# Patient Record
Sex: Female | Born: 1945
Health system: Southern US, Community
[De-identification: ages and names within clinical notes are randomized; demographics above are authoritative.]

## PROBLEM LIST (undated history)

## (undated) DIAGNOSIS — K449 Diaphragmatic hernia without obstruction or gangrene: Secondary | ICD-10-CM

## (undated) DIAGNOSIS — K219 Gastro-esophageal reflux disease without esophagitis: Secondary | ICD-10-CM

## (undated) DIAGNOSIS — R112 Nausea with vomiting, unspecified: Secondary | ICD-10-CM

## (undated) DIAGNOSIS — C539 Malignant neoplasm of cervix uteri, unspecified: Secondary | ICD-10-CM

## (undated) DIAGNOSIS — D649 Anemia, unspecified: Secondary | ICD-10-CM

## (undated) DIAGNOSIS — R519 Headache, unspecified: Secondary | ICD-10-CM

## (undated) DIAGNOSIS — Z8719 Personal history of other diseases of the digestive system: Secondary | ICD-10-CM

## (undated) DIAGNOSIS — B009 Herpesviral infection, unspecified: Secondary | ICD-10-CM

## (undated) DIAGNOSIS — Z9889 Other specified postprocedural states: Secondary | ICD-10-CM

## (undated) DIAGNOSIS — H409 Unspecified glaucoma: Secondary | ICD-10-CM

## (undated) DIAGNOSIS — K222 Esophageal obstruction: Secondary | ICD-10-CM

## (undated) DIAGNOSIS — H04123 Dry eye syndrome of bilateral lacrimal glands: Secondary | ICD-10-CM

## (undated) DIAGNOSIS — N809 Endometriosis, unspecified: Secondary | ICD-10-CM

## (undated) DIAGNOSIS — G4700 Insomnia, unspecified: Secondary | ICD-10-CM

## (undated) DIAGNOSIS — Z860100 Personal history of colon polyps, unspecified: Secondary | ICD-10-CM

## (undated) DIAGNOSIS — M1990 Unspecified osteoarthritis, unspecified site: Secondary | ICD-10-CM

## (undated) DIAGNOSIS — K589 Irritable bowel syndrome without diarrhea: Secondary | ICD-10-CM

## (undated) DIAGNOSIS — G2581 Restless legs syndrome: Secondary | ICD-10-CM

## (undated) DIAGNOSIS — I10 Essential (primary) hypertension: Secondary | ICD-10-CM

## (undated) DIAGNOSIS — K2970 Gastritis, unspecified, without bleeding: Secondary | ICD-10-CM

## (undated) DIAGNOSIS — K5790 Diverticulosis of intestine, part unspecified, without perforation or abscess without bleeding: Secondary | ICD-10-CM

## (undated) HISTORY — DX: Unspecified glaucoma: H40.9

## (undated) HISTORY — DX: Nausea with vomiting, unspecified: R11.2

## (undated) HISTORY — DX: Anemia, unspecified: D64.9

## (undated) HISTORY — PX: TUBAL LIGATION: SHX77

## (undated) HISTORY — DX: Essential (primary) hypertension: I10

## (undated) HISTORY — DX: Personal history of colonic polyps: Z86.010

## (undated) HISTORY — DX: Personal history of colon polyps, unspecified: Z86.0100

## (undated) HISTORY — DX: Personal history of other diseases of the digestive system: Z87.19

## (undated) HISTORY — DX: Diverticulosis of intestine, part unspecified, without perforation or abscess without bleeding: K57.90

## (undated) HISTORY — PX: HAND SURGERY: SHX662

## (undated) HISTORY — DX: Insomnia, unspecified: G47.00

## (undated) HISTORY — DX: Unspecified osteoarthritis, unspecified site: M19.90

## (undated) HISTORY — PX: OOPHORECTOMY: SHX86

## (undated) HISTORY — DX: Other specified postprocedural states: Z98.890

## (undated) HISTORY — DX: Esophageal obstruction: K22.2

## (undated) HISTORY — DX: Gastro-esophageal reflux disease without esophagitis: K21.9

## (undated) HISTORY — DX: Irritable bowel syndrome, unspecified: K58.9

## (undated) HISTORY — DX: Herpesviral infection, unspecified: B00.9

## (undated) HISTORY — DX: Malignant neoplasm of cervix uteri, unspecified: C53.9

## (undated) HISTORY — DX: Diaphragmatic hernia without obstruction or gangrene: K44.9

## (undated) HISTORY — DX: Restless legs syndrome: G25.81

## (undated) HISTORY — DX: Dry eye syndrome of bilateral lacrimal glands: H04.123

## (undated) HISTORY — DX: Gastritis, unspecified, without bleeding: K29.70

## (undated) HISTORY — DX: Endometriosis, unspecified: N80.9

## (undated) HISTORY — PX: CERVICAL CONE BIOPSY: SUR198

---

## 1975-03-07 DIAGNOSIS — C539 Malignant neoplasm of cervix uteri, unspecified: Secondary | ICD-10-CM

## 1975-03-07 HISTORY — DX: Malignant neoplasm of cervix uteri, unspecified: C53.9

## 1978-03-06 HISTORY — PX: CHOLECYSTECTOMY: SHX55

## 1996-03-06 HISTORY — PX: ABDOMINAL HYSTERECTOMY: SHX81

## 1996-03-06 HISTORY — PX: APPENDECTOMY: SHX54

## 1997-12-15 ENCOUNTER — Encounter: Admission: RE | Admit: 1997-12-15 | Discharge: 1998-03-15 | Payer: Self-pay | Admitting: *Deleted

## 1999-01-21 ENCOUNTER — Ambulatory Visit (HOSPITAL_COMMUNITY): Admission: RE | Admit: 1999-01-21 | Discharge: 1999-01-21 | Payer: Self-pay | Admitting: Internal Medicine

## 1999-08-16 ENCOUNTER — Encounter: Admission: RE | Admit: 1999-08-16 | Discharge: 1999-08-16 | Payer: Self-pay | Admitting: Obstetrics and Gynecology

## 1999-08-16 ENCOUNTER — Encounter: Payer: Self-pay | Admitting: Obstetrics and Gynecology

## 2000-02-02 ENCOUNTER — Encounter: Payer: Self-pay | Admitting: Internal Medicine

## 2000-02-02 ENCOUNTER — Ambulatory Visit (HOSPITAL_COMMUNITY): Admission: RE | Admit: 2000-02-02 | Discharge: 2000-02-02 | Payer: Self-pay | Admitting: Internal Medicine

## 2000-03-02 ENCOUNTER — Encounter (INDEPENDENT_AMBULATORY_CARE_PROVIDER_SITE_OTHER): Payer: Self-pay | Admitting: *Deleted

## 2000-03-02 ENCOUNTER — Encounter: Payer: Self-pay | Admitting: Surgery

## 2000-03-06 HISTORY — PX: COLON RESECTION: SHX5231

## 2000-03-23 ENCOUNTER — Encounter: Payer: Self-pay | Admitting: Surgery

## 2000-03-29 ENCOUNTER — Encounter (INDEPENDENT_AMBULATORY_CARE_PROVIDER_SITE_OTHER): Payer: Self-pay | Admitting: Specialist

## 2000-03-29 ENCOUNTER — Encounter: Payer: Self-pay | Admitting: Internal Medicine

## 2000-03-29 ENCOUNTER — Inpatient Hospital Stay (HOSPITAL_COMMUNITY): Admission: RE | Admit: 2000-03-29 | Discharge: 2000-04-03 | Payer: Self-pay | Admitting: Surgery

## 2000-04-03 ENCOUNTER — Encounter (INDEPENDENT_AMBULATORY_CARE_PROVIDER_SITE_OTHER): Payer: Self-pay | Admitting: *Deleted

## 2000-08-17 ENCOUNTER — Encounter: Admission: RE | Admit: 2000-08-17 | Discharge: 2000-08-17 | Payer: Self-pay | Admitting: Obstetrics and Gynecology

## 2000-08-17 ENCOUNTER — Encounter: Payer: Self-pay | Admitting: Obstetrics and Gynecology

## 2001-03-06 HISTORY — PX: COLON SURGERY: SHX602

## 2001-04-16 ENCOUNTER — Encounter: Admission: RE | Admit: 2001-04-16 | Discharge: 2001-05-21 | Payer: Self-pay | Admitting: Internal Medicine

## 2001-09-13 ENCOUNTER — Encounter: Payer: Self-pay | Admitting: Internal Medicine

## 2001-10-23 ENCOUNTER — Encounter: Payer: Self-pay | Admitting: Family Medicine

## 2001-10-23 ENCOUNTER — Encounter: Admission: RE | Admit: 2001-10-23 | Discharge: 2001-10-23 | Payer: Self-pay | Admitting: Family Medicine

## 2001-12-03 ENCOUNTER — Encounter: Payer: Self-pay | Admitting: Internal Medicine

## 2001-12-17 ENCOUNTER — Ambulatory Visit (HOSPITAL_COMMUNITY): Admission: RE | Admit: 2001-12-17 | Discharge: 2001-12-17 | Payer: Self-pay | Admitting: Internal Medicine

## 2001-12-17 ENCOUNTER — Encounter: Payer: Self-pay | Admitting: Internal Medicine

## 2002-07-22 ENCOUNTER — Ambulatory Visit (HOSPITAL_BASED_OUTPATIENT_CLINIC_OR_DEPARTMENT_OTHER): Admission: RE | Admit: 2002-07-22 | Discharge: 2002-07-22 | Payer: Self-pay | Admitting: Urology

## 2002-07-22 ENCOUNTER — Encounter (INDEPENDENT_AMBULATORY_CARE_PROVIDER_SITE_OTHER): Payer: Self-pay

## 2002-10-27 ENCOUNTER — Encounter: Admission: RE | Admit: 2002-10-27 | Discharge: 2002-10-27 | Payer: Self-pay | Admitting: Obstetrics and Gynecology

## 2002-10-27 ENCOUNTER — Encounter: Payer: Self-pay | Admitting: Obstetrics and Gynecology

## 2003-10-30 ENCOUNTER — Encounter: Admission: RE | Admit: 2003-10-30 | Discharge: 2003-10-30 | Payer: Self-pay | Admitting: Obstetrics and Gynecology

## 2003-12-18 ENCOUNTER — Encounter: Payer: Self-pay | Admitting: Internal Medicine

## 2003-12-18 ENCOUNTER — Ambulatory Visit (HOSPITAL_COMMUNITY): Admission: RE | Admit: 2003-12-18 | Discharge: 2003-12-18 | Payer: Self-pay | Admitting: Internal Medicine

## 2003-12-18 ENCOUNTER — Encounter (INDEPENDENT_AMBULATORY_CARE_PROVIDER_SITE_OTHER): Payer: Self-pay | Admitting: *Deleted

## 2004-12-09 ENCOUNTER — Encounter: Admission: RE | Admit: 2004-12-09 | Discharge: 2004-12-09 | Payer: Self-pay | Admitting: Obstetrics and Gynecology

## 2004-12-23 ENCOUNTER — Encounter: Admission: RE | Admit: 2004-12-23 | Discharge: 2004-12-23 | Payer: Self-pay | Admitting: Obstetrics and Gynecology

## 2005-05-26 ENCOUNTER — Encounter: Admission: RE | Admit: 2005-05-26 | Discharge: 2005-05-26 | Payer: Self-pay | Admitting: Obstetrics and Gynecology

## 2006-01-12 ENCOUNTER — Encounter: Admission: RE | Admit: 2006-01-12 | Discharge: 2006-01-12 | Payer: Self-pay | Admitting: Obstetrics and Gynecology

## 2007-01-18 ENCOUNTER — Encounter: Admission: RE | Admit: 2007-01-18 | Discharge: 2007-01-18 | Payer: Self-pay | Admitting: Obstetrics and Gynecology

## 2008-01-20 ENCOUNTER — Encounter: Admission: RE | Admit: 2008-01-20 | Discharge: 2008-01-20 | Payer: Self-pay | Admitting: Obstetrics and Gynecology

## 2009-01-20 ENCOUNTER — Encounter: Admission: RE | Admit: 2009-01-20 | Discharge: 2009-01-20 | Payer: Self-pay | Admitting: Family Medicine

## 2009-01-27 ENCOUNTER — Telehealth: Payer: Self-pay | Admitting: Internal Medicine

## 2009-01-27 ENCOUNTER — Encounter (INDEPENDENT_AMBULATORY_CARE_PROVIDER_SITE_OTHER): Payer: Self-pay | Admitting: *Deleted

## 2009-02-04 DIAGNOSIS — N809 Endometriosis, unspecified: Secondary | ICD-10-CM | POA: Insufficient documentation

## 2009-02-04 DIAGNOSIS — K5730 Diverticulosis of large intestine without perforation or abscess without bleeding: Secondary | ICD-10-CM | POA: Insufficient documentation

## 2009-02-04 DIAGNOSIS — K222 Esophageal obstruction: Secondary | ICD-10-CM

## 2009-02-04 DIAGNOSIS — K219 Gastro-esophageal reflux disease without esophagitis: Secondary | ICD-10-CM | POA: Insufficient documentation

## 2009-02-04 DIAGNOSIS — K2970 Gastritis, unspecified, without bleeding: Secondary | ICD-10-CM | POA: Insufficient documentation

## 2009-02-04 DIAGNOSIS — K2990 Gastroduodenitis, unspecified, without bleeding: Secondary | ICD-10-CM

## 2009-02-04 DIAGNOSIS — K589 Irritable bowel syndrome without diarrhea: Secondary | ICD-10-CM | POA: Insufficient documentation

## 2009-02-04 DIAGNOSIS — K449 Diaphragmatic hernia without obstruction or gangrene: Secondary | ICD-10-CM | POA: Insufficient documentation

## 2009-02-05 ENCOUNTER — Ambulatory Visit: Payer: Self-pay | Admitting: Internal Medicine

## 2009-02-05 DIAGNOSIS — M129 Arthropathy, unspecified: Secondary | ICD-10-CM | POA: Insufficient documentation

## 2009-02-05 DIAGNOSIS — R141 Gas pain: Secondary | ICD-10-CM | POA: Insufficient documentation

## 2009-02-05 DIAGNOSIS — R142 Eructation: Secondary | ICD-10-CM

## 2009-02-05 DIAGNOSIS — R143 Flatulence: Secondary | ICD-10-CM

## 2009-02-05 DIAGNOSIS — I10 Essential (primary) hypertension: Secondary | ICD-10-CM | POA: Insufficient documentation

## 2009-02-05 DIAGNOSIS — R1032 Left lower quadrant pain: Secondary | ICD-10-CM | POA: Insufficient documentation

## 2009-02-05 DIAGNOSIS — R1310 Dysphagia, unspecified: Secondary | ICD-10-CM

## 2009-02-05 DIAGNOSIS — C539 Malignant neoplasm of cervix uteri, unspecified: Secondary | ICD-10-CM | POA: Insufficient documentation

## 2009-02-18 ENCOUNTER — Ambulatory Visit: Payer: Self-pay | Admitting: Internal Medicine

## 2009-02-22 ENCOUNTER — Encounter: Payer: Self-pay | Admitting: Internal Medicine

## 2010-01-24 ENCOUNTER — Encounter: Admission: RE | Admit: 2010-01-24 | Discharge: 2010-01-24 | Payer: Self-pay | Admitting: Family Medicine

## 2010-07-22 NOTE — H&P (Signed)
[facility]  Patient:    Andrea Byrd, Andrea Byrd                           MRN: [id number] Attending:  Katherine Roan, M.D.                         History and Physical  CHIEF COMPLAINT:  Pelvic pain and stress urinary incontinence.  HISTORY OF PRESENT ILLNESS:  Andrea Byrd is a 65 year old gravida 1, para 1 female status post TAH for carcinoma in situ in 1986 with subsequent normal Pap smears who presents for anterior colectomy for diverticulitis; and at the time, she has desired Burch procedure for stress urinary incontinence which appears to be genuine stress urinary incontinence. She is on hormone replacement therapy in the form of Premarin, and she takes Xanax p.r.n.  ALLERGIES:  She is allergic to [facility].  PAST SURGICAL HISTORY:  A cold knife conization in 1977, hysterectomy in 1986, and tubal ligation in 1978. She has had one normal baby.  She presents now for Burch procedure in conjunction with a colectomy.  REVIEW OF SYSTEMS:  HEENT:  She wears glasses but no decrease in visual, auditory acuity. No frequent headaches or dizziness. HEART:  No history of hypertension or rheumatic fever. No chest pain. No shortness of breath. LUNGS: No chronic cough. No asthma. No hayfever. GU:  She has genuine stress urinary incontinence with minimal bladder irritability. GI:  She has been treated for diverticulitis and has segmental changes, so much so that a segmental resection has been recommended. In addition to that, she has irritable bowel. MUSCLES, BONES, AND JOINTS:  No history of fractures or arthritis.  SOCIAL HISTORY:  She is an Teacher, early years/pre.  FAMILY HISTORY:  Her mother is 59 and has diabetes and high blood pressure. Father died at 21. He had hypertension. She has two brothers and two sisters. One of her sisters is diabetic. One brother committed suicide. Her maternal grandmother, mother, and sister are all diabetics.  PHYSICAL EXAMINATION:  VITAL  SIGNS:  Weight of 143.  HEENT:  Unremarkable. Oropharynx is not injected.  NECK:  Supple. Carotid pulses are equal without bruits. No adenopathy.  BREASTS:  No masses or tenderness.  LUNGS:  Clear to P&A.  HEART:  Normal sinus rhythm. No murmurs, heaves, thrills, rubs, or gallops.  ABDOMEN:  Soft and flat. Liver, spleen, and kidneys are not palpated. There is a transverse incision in the abdomen. There is tenderness in the lower quadrant. No rebound or guarding.  PELVIC:  Fairly normal vagina. There is a second degree cystocele with moderate amount of urethral mobility. No pelvic masses are noted. Vaginal vault is well supported, and recent Pap smear is normal. Hemoccult is negative.  EXTREMITIES:  Good range of motion. Equal pulses and reflexes.  NEUROLOGICAL:  Cranial nerves are intact.  IMPRESSION:  Genuine stress urinary incontinence for Burch procedure in conjunction with partial colectomy for diverticulitis. Risks, benefits, and failure rates have been discussed with the patient. Of note, there may be an associated posterior repair if posterior segment relaxation is noted under anesthesia.DD:  03/27/00 TD:  03/27/00 Job: [id number]

## 2010-07-22 NOTE — Op Note (Signed)
[facility]  Patient:    Andrea Byrd, Andrea Byrd                           MRN: [id number] Proc. Date: 03/29/00 Attending:  Katherine Roan, M.D.                           Operative Report  PREOPERATIVE DIAGNOSES: 1. Diverticulitis with pelvic pain and diverticulosis. 2. Stress urinary incontinence.  POSTOPERATIVE DIAGNOSES: 1. Diverticulitis with pelvic pain and diverticulosis. 2. Stress urinary incontinence.  OPERATION PERFORMED:   Anterior colectomy by Dr. Jamey Ripa and Burch procedure with posterior repair.  DESCRIPTION OF PROCEDURE:  The patient was placed in the supine position, prepped and draped in the usual fashion. Dr. Jamey Ripa opened the abdomen through a midline approach and did an anterior resection of the sigmoid diverticulitis. At the termination of the procedure in closing the peritoneum, we dissected the space of Retzius and identified the vagina and sutured the paravaginal tissue to the inguinal ligament on each side. This affected an excellent bladder repair. The space of Retzius was irrigated. The midline incision and fascia was closed with figure-of-eight sutures of #0 Novofil. Seven knots were placed in each suture. Hemostasis was secure. We then irrigated the subcutaneum and closed the skin with clips. Following this, we went down below and dissected the wedge of the perineal body, dissected the rectum from the prerectal fascia and then plicated this in the midline with interrupted sutures of 3-0 Vicryl. The wedge of the vagina was excised and then closed with a running suture of 2-0 chromic. The superficial transverse perinei muscle was closed with interrupted sutures of 3-0 Vicryl. The skin was closed with a continuation of a 2-0 chromic. The vagina was then packed. The levator sling was then infiltrated with 0.5% Marcaine with epinephrine on each side. The Foley was draining clear urine. [name] tolerated this procedure well and was sent to the  recovery room in good condition. DD:  03/29/00 TD:  03/29/00 Job: [id number],/[id number]

## 2010-07-22 NOTE — Discharge Summary (Signed)
[facility]  Patient:    Andrea Byrd, Andrea Byrd                         MRN: [id number] Adm. Date:  [id number] Disc. Date: 04/03/00 Attending:  Charlton Haws CC:         Delorse Lek, M.D.  Hedwig Morton. Juanda Chance, M.D. [facility]  S. Kyra Manges, M.D.   Discharge Summary  CCS#:  [id number]  FINAL DIAGNOSES: 1. Sigmoid diverticulitis with pelvic pain. 2. Stress incontinence.  CLINICAL HISTORY:  This patient is a 65 year old with multiple episodes of diverticulitis that were all fairly mild but had been getting increasing in frequency. Workup by Dr. Lina Sar had been unremarkable except for the presence of marked diverticulosis. She also had stress incontinence.  HOSPITAL COURSE:  The patient was admitted and taken to the operating room where a sigmoid colectomy with primary anastomosis was performed. In addition, a burch procedure with a posterior repair for her stress incontinence was performed. She tolerated the procedures well. Postoperatively, she had a very benign postoperative course. The NG tube was not needed. She had relatively minimal amounts of pain. Initially she was on PCA morphine but was a little bit itching from that so we switched to PCA Dilaudid. She continued to improve over the next couple of days. Diet was gradually advanced and by January 29 was having bowel movements albeit loose, and tolerating solid foods with no fever. Her abdomen was soft and benign and wound was healing nicely. C Dif was checked and was negative.  Pathology confirmed diverticulitis.  The patient was discharged on January 29 in satisfactory condition, Tylox for pain, limited activities, usual diet, follow-up in my office in approximately two weeks and also approximately two weeks with Dr. Elana Alm. DD:  04/03/00 TD:  04/03/00 Job: [id number]

## 2010-07-22 NOTE — Op Note (Signed)
[facility]  Patient:    Andrea Byrd, Andrea Byrd                        MRN: [id number] Proc. Date: 03/29/00 Attending:  Currie Paris, M.D. CC:         Hedwig Morton. Juanda Chance, M.D. [facility]  Delorse Lek, M.D.  Katherine Roan, M.D.   Operative Report  ACCOUNT:  [id number]  OFFICE MEDICAL RECORD NUMBER #[id number]  PREOPERATIVE DIAGNOSIS:  Sigmoid diverticulosis, with recurring episodes of pain and sigmoid diverticulitis.  OPERATION:  Sigmoid colectomy.  SURGEON:  Currie Paris, M.D.  ASSISTANT:  Katherine Roan, M.D.  ANESTHESIA:  General endotracheal.  INDICATIONS:  This is a 66 year old woman who has had multiple episodes of abdominal pain, and has been evaluated in the past by her gastroenterologist, and these episodes have been thought to be diverticular in origin.  She has had known diverticular disease with tortuosity of the rectosigmoid, distal sigmoid, and after having several of these episodes, she elected to proceed to a sigmoid colectomy. She has had problems with stress incontinence as well, and Dr. Elana Alm had seen  her, and she elected to have a Burch repair and posterior repair done at the same procedure.  DESCRIPTION OF PROCEDURE:  The patient was brought to the operating room and after satisfactory general endotracheal anesthesia had been obtained, she was placed n Allen stirrups.  The abdomen and pelvic areas and perineum were prepped and draped as a sterile field.  A midline incision was made from the umbilicus to the symphysis.  Bleeders were then electrocoagulated.  The fascia was opened in the  midline, and the peritoneal cavity was entered.  The sigmoid colon was noted to be somewhat thickened and tethered in her left lower quadrant, where her prior hysterectomy had been done, and probably from where the ovaries and some endometriosis had been taken out.  This was chronically inflamed but not acutely inflamed, and I  thought most likely the source of her pain, as this was fairly consistent with the location of where she described her symptoms.  There were no other palpable masses in her abdomen.  The appendix was surgically absent, as were the pelvic organs.  The liver and gallbladder could not be palpated because of ome adhesions in the right upper quadrant from prior surgery.  The wound stretcher and self-retaining retractors were placed, and the small bowel packed out of the pelvis.  I began by mobilizing the peritoneum laterally along the white line of Toldt, to free up the sigmoid a little bit above the area that was tethered, and in doing so were able to identify the left ureter and keep it posterior.  Where the colon was tethered it was somewhat anterior, and the dense adhesions were taken down sharply with the scissors.  The bleeders were electrocoagulated, tied, or clipped.  Once I had this freed up and mobilized, I  found a place at the very distal descending, where I had this freed up, and thought that this would be a good place to divide the bowel, as it appeared to be normal. I did not see any significant evidence of diverticula.  I made a window in the bowel mesentery, and divided the bowel between a Kocher and an Allen clamp. The mesentery was then divided between clamps, going down, staying fairly close to he bowel until I had gotten to the junction of the rectosigmoid, where I felt I  was beyond any evidence of diverticular disease, and had completely removed the segment involved with chronic inflammatory changes.  A couple of stay sutures were placed on the colon here, and the colon was divided off using a Satinsky for a cutting  guide.  The two ends of the bowel overlapped each other easily, so I thought that there  would be no tension on the anastomosis.  A hand-sewn single layer anastomosis was done.  A posterior row of #3-0 silks was placed and then tied.   Inverting sutures were then used to close most of the anterior row with the last few sutures being placed as Gambees.  This produced a nice anastomosis which was patent enough to  easily admit my thumb.  There was no tension.  No evidence of active bleeding. The mesenteric defect was closed with a single #3-0 silk.  At this point we irrigated and  make sure that everything was dry.  I did leave a little Surgicel along the lateral pelvic wall where there had been some oozing, where the adhesions had been taken down, but prior to placing it, it appeared to be dry.  The peritoneum was then closed with a running PDS.  At this point Dr. Elana Alm took over the procedure, to do the Burch procedure, the posterior repair, and to close the abdomen.  The remaining portion of the procedure will therefore be dictated by him. DD:  03/29/00 TD:  03/29/00 Job: [id number]

## 2010-07-22 NOTE — Op Note (Signed)
NAME:  Andrea Byrd, Andrea Byrd                            ACCOUNT NO.:  [id number]   MEDICAL RECORD NO.:  [id number]                   PATIENT TYPE:  AMB   LOCATION:  [facility]                                 FACILITY:  [facility]   PHYSICIAN:  Ronald L. Ovidio Hanger, M.D.           DATE OF BIRTH:  1945-04-30   DATE OF PROCEDURE:  07/22/2002  DATE OF DISCHARGE:                                 OPERATIVE REPORT   PREOPERATIVE DIAGNOSIS:  Bladder symptomatology:  Urgency, frequency,  dysuria, and bladder pain.   PROCEDURES:  1. Cystourethroscopy.  2. Hydraulic bladder distention.  3. Bladder biopsy.  4. Excision of foreign body from bladder, felt to be most likely an Ethibond     suture.   SURGEON:  Lucrezia Starch. Earlene Plater, M.D.   ANESTHESIA:  General mask anesthesia.   ESTIMATED BLOOD LOSS:  Negligible.   TUBES:  None.   COMPLICATIONS:  None.   INDICATION FOR PROCEDURE:  The patient is a lovely 65 year old white female  who presented with a three-month history of sharp pain when finishing  urination, pressure when she bends over.  She has had a round of antibiotics  that did not help.  She notes that she is slightly better after urinating.  She also has frequent urge to urinate and some pain after urinating.  We  discussed risks, benefits, and alternatives, and after understanding this  she has elected to proceed with the above procedure.  Of note, she had  severe endometriosis and has undergone a small bowel resection and an  anterior vesicocystourethropexy in 2001.   DESCRIPTION OF PROCEDURE:  The patient was placed in the supine position,  after the proper general mask anesthesia was placed in the dorsal lithotomy  position and prepped and draped with Betadine in a sterile fashion.  Cystourethroscopy was performed with a 22.5 [place] panendoscope utilizing  the 12 and 70 degree lenses.  The bladder was carefully inspected.  Efflux  of clear urine was noted through the normally-placed ureteral  orifices  bilaterally.  On the right lateral anterior portion of the bladder, a green  suture was noted to be tangentially through-and-through and protruding into  the bladder.  There was some inflammation noted around it.  It was felt that  this very well could be causing the discomfort.  It was a green woven suture  and felt to be most likely an Ethibond.  It had some calcific debris on it.  Hydraulic bladder distention was performed to 80 cmH2O.  She was noted to  have a capacity of 750 mL.  When it was drained, there were a few  glomerulations posteriorly and the posterior bladder was biopsied with cold  cup biopsy forceps and the base cauterized with Bugbee coagulation cautery.  It was felt that the suture may very well be stimulating things.  With a  grasping forceps it was pulled and noted to be quite  tight.  Utilizing  meatotomy scissors, it was cut right at the mucosal level and the suture  snapped back and utilizing the 30 degree lens, a two-port system with a 25  [place] panendoscope, a suture end was grasped with alligator forceps and  utilizing the meatotomy forceps through a separate port, it was snipped and  then the bladder was distended with approximately 500 mL, and the sutures  appeared to disappear submucosally.  The mucosa was then gently  cauterized with Bovie coagulation cautery and the foreign body was submitted  to pathology.  Reinspection revealed no other lesions present.  The bladder  was drained, again efflux of clear urine was noted bilaterally, and the  patient was taken to the recovery room in stable condition.                                               Ronald L. Ovidio Hanger, M.D.    RLD/MEDQ  D:  07/22/2002  T:  07/22/2002  Job:  [id number]

## 2011-01-17 ENCOUNTER — Other Ambulatory Visit: Payer: Self-pay | Admitting: Family Medicine

## 2011-01-17 DIAGNOSIS — Z1231 Encounter for screening mammogram for malignant neoplasm of breast: Secondary | ICD-10-CM

## 2011-02-09 ENCOUNTER — Ambulatory Visit: Payer: Self-pay

## 2011-02-16 ENCOUNTER — Ambulatory Visit
Admission: RE | Admit: 2011-02-16 | Discharge: 2011-02-16 | Disposition: A | Discharge status: Home / Self Care | Payer: 59 | Source: Ambulatory Visit | Attending: Family Medicine | Admitting: Family Medicine

## 2011-02-16 DIAGNOSIS — Z1231 Encounter for screening mammogram for malignant neoplasm of breast: Secondary | ICD-10-CM

## 2011-11-10 ENCOUNTER — Other Ambulatory Visit: Payer: Self-pay | Admitting: Family Medicine

## 2011-11-10 DIAGNOSIS — Z1231 Encounter for screening mammogram for malignant neoplasm of breast: Secondary | ICD-10-CM

## 2011-12-04 ENCOUNTER — Encounter: Payer: Self-pay | Admitting: Internal Medicine

## 2011-12-20 ENCOUNTER — Other Ambulatory Visit: Payer: Self-pay | Admitting: Family Medicine

## 2011-12-20 DIAGNOSIS — Z780 Asymptomatic menopausal state: Secondary | ICD-10-CM

## 2011-12-22 ENCOUNTER — Other Ambulatory Visit (INDEPENDENT_AMBULATORY_CARE_PROVIDER_SITE_OTHER): Payer: Self-pay | Admitting: Surgery

## 2012-01-05 ENCOUNTER — Encounter: Payer: Self-pay | Admitting: *Deleted

## 2012-01-11 ENCOUNTER — Encounter: Payer: Self-pay | Admitting: *Deleted

## 2012-01-25 ENCOUNTER — Other Ambulatory Visit: Payer: Self-pay

## 2012-01-31 ENCOUNTER — Other Ambulatory Visit (INDEPENDENT_AMBULATORY_CARE_PROVIDER_SITE_OTHER): Payer: Medicare Other

## 2012-01-31 ENCOUNTER — Encounter: Payer: Self-pay | Admitting: Internal Medicine

## 2012-01-31 ENCOUNTER — Ambulatory Visit (INDEPENDENT_AMBULATORY_CARE_PROVIDER_SITE_OTHER): Payer: Medicare Other | Admitting: Internal Medicine

## 2012-01-31 VITALS — BP 100/60 | HR 100 | Ht 62.25 in | Wt 170.4 lb

## 2012-01-31 DIAGNOSIS — R6889 Other general symptoms and signs: Secondary | ICD-10-CM

## 2012-01-31 DIAGNOSIS — D649 Anemia, unspecified: Secondary | ICD-10-CM

## 2012-01-31 LAB — COMPREHENSIVE METABOLIC PANEL WITH GFR
ALT: 23 U/L (ref 0–35)
AST: 22 U/L (ref 0–37)
Albumin: 3.7 g/dL (ref 3.5–5.2)
Alkaline Phosphatase: 118 U/L — ABNORMAL HIGH (ref 39–117)
BUN: 14 mg/dL (ref 6–23)
CO2: 30 meq/L (ref 19–32)
Calcium: 9.3 mg/dL (ref 8.4–10.5)
Chloride: 103 meq/L (ref 96–112)
Creatinine, Ser: 0.9 mg/dL (ref 0.4–1.2)
GFR: 71.16 mL/min (ref >60.00)
Glucose, Bld: 90 mg/dL (ref 70–99)
Potassium: 3.4 meq/L — ABNORMAL LOW (ref 3.5–5.1)
Sodium: 139 meq/L (ref 135–145)
Total Bilirubin: 0.4 mg/dL (ref 0.3–1.2)
Total Protein: 7.1 g/dL (ref 6.0–8.3)

## 2012-01-31 LAB — CBC WITH DIFFERENTIAL/PLATELET
Basophils Absolute: 0 K/uL (ref 0.0–0.1)
Basophils Relative: 0.7 % (ref 0.0–3.0)
Eosinophils Absolute: 0.1 K/uL (ref 0.0–0.7)
Eosinophils Relative: 1.8 % (ref 0.0–5.0)
HCT: 33.5 % — ABNORMAL LOW (ref 36.0–46.0)
Hemoglobin: 10.5 g/dL — ABNORMAL LOW (ref 12.0–15.0)
Lymphocytes Relative: 27.6 % (ref 12.0–46.0)
Lymphs Abs: 1.8 K/uL (ref 0.7–4.0)
MCHC: 31.3 g/dL (ref 30.0–36.0)
MCV: 78.8 fl (ref 78.0–100.0)
Monocytes Absolute: 0.5 K/uL (ref 0.1–1.0)
Monocytes Relative: 7 % (ref 3.0–12.0)
Neutro Abs: 4.1 K/uL (ref 1.4–7.7)
Neutrophils Relative %: 62.9 % (ref 43.0–77.0)
Platelets: 382 K/uL (ref 150.0–400.0)
RBC: 4.24 Mil/uL (ref 3.87–5.11)
RDW: 17.9 % — ABNORMAL HIGH (ref 11.5–14.6)
WBC: 6.5 K/uL (ref 4.5–10.5)

## 2012-01-31 LAB — FOLATE: Folate: 16.4 ng/mL (ref >5.9)

## 2012-01-31 LAB — SEDIMENTATION RATE: Sed Rate: 30 mm/h — ABNORMAL HIGH (ref 0–22)

## 2012-01-31 NOTE — Patient Instructions (Addendum)
Your physician has requested that you go to the basement for the following lab work before leaving today: CBC, IBC, Sed Rate, Folic Acid, CMET  CC: Dr Marjory Lies

## 2012-01-31 NOTE — Progress Notes (Signed)
Andrea Byrd 1945-11-24 MRN [id number]   History of Present Illness:  This is a 66 year old white female with a history of symptomatic diverticulosis, post sigmoid resection in 2002 with recurrent left lower quadrant abdominal discomfort consistent with irritable bowel syndrome. She is here today because of new onset anemia. In September 2013, her hemoglobin was 10.6 with a hematocrit of 32.6 and MCV of 80. Her B12 level was 26. She denies any visible blood per rectum. Her bowel habits are erratic. She has diarrhea alternating with constipation. There is no family history of colon cancer. She has a history of esophageal stricture which was dilated in the past in 1984, 2003 and most recently in December 2010. She currently denies any dysphagia to solids. She takes Aleve, one a day. She denies any dyspepsia, heartburn or dysphagia. Her weight has been steadily increasing. Her last colonoscopy in October 2005 was normal with a wide-open sigmoid anastomosis.   Past Medical History  Diagnosis Date  . Esophageal stricture   . Hiatal hernia   . Gastritis   . Cervical cancer 1977  . Hypertension   . Arthritis   . Endometriosis   . GERD (gastroesophageal reflux disease)   . Diverticulosis   . IBS (irritable bowel syndrome)   . Anemia   . Restless leg syndrome   . Herpes simplex type 1 infection   . Insomnia   . History of colon polyps   . History of gallstones    Past Surgical History  Procedure Date  . Cholecystectomy 1980  . Abdominal hysterectomy 1998  . Oophorectomy   . Tubal ligation   . Cervical cone biopsy   . Colon resection 2002  . Appendectomy 1998    reports that she has never smoked. She has never used smokeless tobacco. She reports that she does not drink alcohol or use illicit drugs. family history includes Celiac disease in her other; Colon polyps in her father, mother, and sister; Diabetes in her mother and sister; Heart attack in her paternal grandfather; Heart disease in  her father; Kidney cancer in her sister; Kidney disease in her father; and Stroke in her mother.  There is no history of Colon cancer. Allergies  Allergen Reactions  . Morphine   . Nitrofurantoin   . Propoxyphene-Acetaminophen     REACTION: nausea        Review of Systems: Negative for dysphagia, odynophagia, hematochezia  The remainder of the 10 point ROS is negative except as outlined in H&P   Physical Exam: General appearance  Well developed, in no distress. Eyes- non icteric. HEENT nontraumatic, normocephalic. Mouth no lesions, tongue papillated, no cheilosis. Neck supple without adenopathy, thyroid not enlarged, no carotid bruits, no JVD. Lungs Clear to auscultation bilaterally. Cor normal S1, normal S2, regular rhythm, no murmur,  quiet precordium. Abdomen: Soft abdomen with normoactive bowel sounds. Minimal tenderness in left lower quadrant. No distention. No fullness. Liver edge at costal margin. Rectal: Small amount of soft Hemoccult negative stool. Extremities no pedal edema. Skin no lesions. Neurological alert and oriented x 3. Psychological normal mood and affect.  Assessment and Plan:  Problem #1 Onset of microcytic anemia in the setting of Hemoccult negative stool. The etiology is not clear. She is taking Aleve daily which could cause gastropathy. We will check her iron studies first and if she is iron deficient, then I would recommend a colonoscopy / upper endoscopy to look for a source of blood loss. If she is not iron deficient and her anemia  is not related to GI blood loss, then she may need a hematology consultation. I have discussed this with her and we will let her know about the results of today's blood tests.   01/31/2012 Andrea Byrd

## 2012-02-02 ENCOUNTER — Encounter: Payer: Self-pay | Admitting: Internal Medicine

## 2012-02-05 ENCOUNTER — Other Ambulatory Visit: Payer: Self-pay | Admitting: *Deleted

## 2012-02-05 DIAGNOSIS — D509 Iron deficiency anemia, unspecified: Secondary | ICD-10-CM

## 2012-02-05 MED ORDER — FERROUS SULFATE 325 (65 FE) MG PO TABS: ORAL_TABLET | ORAL | Status: DC

## 2012-02-06 ENCOUNTER — Ambulatory Visit (AMBULATORY_SURGERY_CENTER): Payer: Medicare Other | Admitting: *Deleted

## 2012-02-06 VITALS — Ht 62.5 in | Wt 170.0 lb

## 2012-02-06 DIAGNOSIS — D509 Iron deficiency anemia, unspecified: Secondary | ICD-10-CM

## 2012-02-06 MED ORDER — PEG-KCL-NACL-NASULF-NA ASC-C 100 G PO SOLR: ORAL | Status: DC

## 2012-02-06 NOTE — Progress Notes (Signed)
No allergies to eggs or soy products 

## 2012-02-09 ENCOUNTER — Encounter: Payer: Self-pay | Admitting: Internal Medicine

## 2012-02-09 ENCOUNTER — Ambulatory Visit (AMBULATORY_SURGERY_CENTER): Payer: Medicare Other | Admitting: Internal Medicine

## 2012-02-09 ENCOUNTER — Other Ambulatory Visit: Payer: Self-pay | Admitting: *Deleted

## 2012-02-09 VITALS — BP 103/72 | HR 84 | Temp 97.5°F | Resp 34 | Ht 62.0 in | Wt 170.0 lb

## 2012-02-09 DIAGNOSIS — K222 Esophageal obstruction: Secondary | ICD-10-CM

## 2012-02-09 DIAGNOSIS — R1319 Other dysphagia: Secondary | ICD-10-CM

## 2012-02-09 DIAGNOSIS — D509 Iron deficiency anemia, unspecified: Secondary | ICD-10-CM

## 2012-02-09 DIAGNOSIS — D1330 Benign neoplasm of unspecified part of small intestine: Secondary | ICD-10-CM

## 2012-02-09 MED ORDER — SODIUM CHLORIDE 0.9 % IV SOLN: 500.0000 mL | INTRAVENOUS | Status: DC

## 2012-02-09 NOTE — Op Note (Signed)
[facility] [address] [place], [address]   ENDOSCOPY PROCEDURE REPORT  PATIENT: Andrea, Byrd  MR#: [id number] BIRTHDATE: 03-21-1945 , 66  yrs. old GENDER: Female ENDOSCOPIST: Hart Carwin, MD REFERRED BY:  Marjory Lies, M.D. PROCEDURE DATE:  02/09/2012 PROCEDURE:  EGD w/ biopsy and Savary dilation of esophagus ASA CLASS:     Class II INDICATIONS:  Iron deficiency anemia.   hx GERD/esoph stricture, dilated in 1914,7829 and 2010, pt takes Aleve 1 qd. MEDICATIONS: MAC sedation, administered by CRNA and Propofol (Diprivan) 290 mg IV TOPICAL ANESTHETIC: Cetacaine Spray  DESCRIPTION OF PROCEDURE: After the risks benefits and alternatives of the procedure were thoroughly explained, informed consent was obtained.  The LB GIF-H180 [id number] endoscope was introduced through the mouth and advanced to the second portion of the duodenum. Without limitations.  The instrument was slowly withdrawn as the mucosa was fully examined.        ESOPHAGUS: A stricture was found.at 35 cm,  The stenosis was traversable with the endoscope. no esophagitis, Savary dilators passes over a guidewire 14,15,16 mm, there was no blood on the dilator , small bowl biopsies were obtained to r/o villous atrophy Retroflexed views revealed no abnormalities.     The scope was then withdrawn from the patient and the procedure completed.  COMPLICATIONS: There were no complications. ENDOSCOPIC IMPRESSION: Stricture was found in distal esophagus, non obstructing, no stigmata of bleeding Stricture dilated with Savary dilators 14-16 mm s/p small bowl biopsies  RECOMMENDATIONS: Await pathology results colonoscopy start Iron supplements recheck CBC in 4 weeks  REPEAT EXAM: no recall  eSigned:  Hart Carwin, MD 02/09/2012 11:56 AM   CC:  PATIENT NAME:  Andrea, Byrd MR#: [id number]

## 2012-02-09 NOTE — Patient Instructions (Addendum)
Colon- Mild diverticulosis  Iron supplements daily.  Recheck H/H in 4 weeks.   Endoscopy- Stricture in esophagus dlated. See dilation diet for today.    YOU HAD AN ENDOSCOPIC PROCEDURE TODAY AT THE [facility]: Refer to the procedure report that was given to you for any specific questions about what was found during the examination.  If the procedure report does not answer your questions, please call your gastroenterologist to clarify.  If you requested that your care partner not be given the details of your procedure findings, then the procedure report has been included in a sealed envelope for you to review at your convenience later.  YOU SHOULD EXPECT: Some feelings of bloating in the abdomen. Passage of more gas than usual.  Walking can help get rid of the air that was put into your GI tract during the procedure and reduce the bloating. If you had a lower endoscopy (such as a colonoscopy or flexible sigmoidoscopy) you may notice spotting of blood in your stool or on the toilet paper. If you underwent a bowel prep for your procedure, then you may not have a normal bowel movement for a few days.  DIET: Your first meal following the procedure should be a light meal and then it is ok to progress to your normal diet.  A half-sandwich or bowl of soup is an example of a good first meal.  Heavy or fried foods are harder to digest and may make you feel nauseous or bloated.  Likewise meals heavy in dairy and vegetables can cause extra gas to form and this can also increase the bloating.  Drink plenty of fluids but you should avoid alcoholic beverages for 24 hours.  ACTIVITY: Your care partner should take you home directly after the procedure.  You should plan to take it easy, moving slowly for the rest of the day.  You can resume normal activity the day after the procedure however you should NOT DRIVE or use heavy machinery for 24 hours (because of the sedation medicines used during the test).     SYMPTOMS TO REPORT IMMEDIATELY: A gastroenterologist can be reached at any hour.  During normal business hours, 8:30 AM to 5:00 PM Monday through Friday, call [phone].  After hours and on weekends, please call the GI answering service at [phone] who will take a message and have the physician on call contact you.   Following lower endoscopy (colonoscopy or flexible sigmoidoscopy):  Excessive amounts of blood in the stool  Significant tenderness or worsening of abdominal pains  Swelling of the abdomen that is new, acute  Fever of 100F or higher  Following upper endoscopy (EGD)  Vomiting of blood or coffee ground material  New chest pain or pain under the shoulder blades  Painful or persistently difficult swallowing  New shortness of breath  Fever of 100F or higher  Black, tarry-looking stools  FOLLOW UP: If any biopsies were taken you will be contacted by phone or by letter within the next 1-3 weeks.  Call your gastroenterologist if you have not heard about the biopsies in 3 weeks.  Our staff will call the home number listed on your records the next business day following your procedure to check on you and address any questions or concerns that you may have at that time regarding the information given to you following your procedure. This is a courtesy call and so if there is no answer at the home number and we have not heard  from you through the emergency physician on call, we will assume that you have returned to your regular daily activities without incident.  SIGNATURES/CONFIDENTIALITY: You and/or your care partner have signed paperwork which will be entered into your electronic medical record.  These signatures attest to the fact that that the information above on your After Visit Summary has been reviewed and is understood.  Full responsibility of the confidentiality of this discharge information lies with you and/or your care-partner.

## 2012-02-09 NOTE — Progress Notes (Signed)
Patient did not experience any of the following events: a burn prior to discharge; a fall within the facility; wrong site/side/patient/procedure/implant event; or a hospital transfer or hospital admission upon discharge from the facility. (G8907) Patient did not have preoperative order for IV antibiotic SSI prophylaxis. (G8918)  

## 2012-02-09 NOTE — Op Note (Signed)
[facility] [address] [place], [address]   COLONOSCOPY PROCEDURE REPORT  PATIENT: [name], [name]  MR#: [id number] BIRTHDATE: [birth year] , 66  yrs. old GENDER: Female ENDOSCOPIST: Hart Carwin, MD REFERRED BY:  Marjory Lies, M.D. PROCEDURE DATE:  02/09/2012 PROCEDURE:   Colonoscopy, diagnostic and Colonoscopy, screening ASA CLASS:   Class II INDICATIONS:Iron Deficiency Anemia and Hgb 8.6, 8% iron saturation , heme negative stool, hx of sigmoid resection for diverticulitis in 2002,,last colonoscopy 2005 MEDICATIONS: MAC sedation, administered by CRNA and propofol (Diprivan) 250mg  IV  DESCRIPTION OF PROCEDURE:   After the risks and benefits and of the procedure were explained, informed consent was obtained.  A digital rectal exam revealed no abnormalities of the rectum.    The LB [id number]  endoscope was introduced through the anus and advanced to the cecum, which was identified by both the appendix and ileocecal valve .  The quality of the prep was good, using MoviPrep .  The instrument was then slowly withdrawn as the colon was fully examined.     COLON FINDINGS: Mild diverticulosis was noted in the descending colon. Sigmoid anastomosis located at 20cm, appears widely patent, , questionable right colon avm vs venous lake, no active bleeding I was unable to retroflex in the  Rectum due to small ampulla. The scope was then withdrawn from the patient and the procedure completed.  COMPLICATIONS: There were no complications. ENDOSCOPIC IMPRESSION: Mild diverticulosis was noted in the descending colon widely patent sigmoid anastomosis questionable right colon avm, no stigmata of bleeding  RECOMMENDATIONS: follow CBC Iron supplements daily, recheck H/H in 4 weeks.Iron deficiency anemia is unexplained. Possibilties include Aleve causing microscopic blood loss,   if  pt unable to maintain normal CBC  we will consider SBCEndoscopy to look for  avm's in the small bowl  REPEAT EXAM: In 10 year(s)  for Colonoscopy.  cc:  _______________________________ eSignedHart Carwin, MD 02/09/2012 12:10 PM     PATIENT NAME:  Andrea Byrd, Andrea Byrd MR#: [id number]

## 2012-02-12 ENCOUNTER — Telehealth: Payer: Self-pay

## 2012-02-12 NOTE — Telephone Encounter (Signed)
  Follow up Call-  Call back number 02/09/2012  Post procedure Call Back phone  # [phone]  Permission to leave phone message Yes     Patient questions:  Do you have a fever, pain , or abdominal swelling? no Pain Score  0 *  Have you tolerated food without any problems? yes  Have you been able to return to your normal activities? yes  Do you have any questions about your discharge instructions: Diet   no Medications  no Follow up visit  no  Do you have questions or concerns about your Care? no  Actions: * If pain score is 4 or above: No action needed, pain <4.   No problems per the pt. Maw

## 2012-02-13 ENCOUNTER — Encounter: Payer: Self-pay | Admitting: Internal Medicine

## 2012-02-19 ENCOUNTER — Ambulatory Visit
Admission: RE | Admit: 2012-02-19 | Discharge: 2012-02-19 | Disposition: A | Discharge status: Home / Self Care | Payer: Medicare Other | Source: Ambulatory Visit | Attending: Family Medicine | Admitting: Family Medicine

## 2012-02-19 DIAGNOSIS — Z1231 Encounter for screening mammogram for malignant neoplasm of breast: Secondary | ICD-10-CM

## 2012-02-19 DIAGNOSIS — Z780 Asymptomatic menopausal state: Secondary | ICD-10-CM

## 2012-02-19 LAB — HM DEXA SCAN: HM Dexa Scan: NORMAL

## 2012-03-07 ENCOUNTER — Telehealth: Payer: Self-pay | Admitting: *Deleted

## 2012-03-07 NOTE — Telephone Encounter (Signed)
Message copied by Daphine Deutscher on Thu Mar 07, 2012 11:20 AM ------      Message from: Daphine Deutscher      Created: Fri Feb 09, 2012  4:16 PM       Call and remind pateint CBC due on 03/12/11 DB

## 2012-03-07 NOTE — Telephone Encounter (Signed)
Patient not available will call back later.

## 2012-03-08 NOTE — Telephone Encounter (Signed)
Spoke with patient and she will have labs done at [facility] and have them faxed to [place]. She plans to have them on Tuesday 03/12/12.

## 2012-03-08 NOTE — Telephone Encounter (Signed)
Left a message for patient to call me back.

## 2012-03-15 ENCOUNTER — Telehealth: Payer: Self-pay | Admitting: Internal Medicine

## 2012-03-15 NOTE — Telephone Encounter (Signed)
Please send Levsin SL.125 mg 1 or 2 q 4 hrs prn crampy abd. Pain, #30, 1 refill,she can try redusing her Omeprazole to 1 qod

## 2012-03-15 NOTE — Telephone Encounter (Signed)
Patient calling to report episode last night of severe cramping and diarrhea after eating a salad and spaghetti. She is much better today but is asking for an rx for cramping to have on hand. States she had an rx in the past but does not have anything recently. She is also concerned about taking Omeprazole and her issues with bone density. Please, advise.

## 2012-03-18 MED ORDER — HYOSCYAMINE SULFATE 0.125 MG SL SUBL: SUBLINGUAL_TABLET | SUBLINGUAL | Status: DC

## 2012-03-18 NOTE — Telephone Encounter (Signed)
Rx sent to pharmacy. Left a message for patient to call me. 

## 2012-03-18 NOTE — Telephone Encounter (Signed)
Spoke with and gave her Dr. Regino Schultze recommendations.

## 2012-03-21 ENCOUNTER — Telehealth: Payer: Self-pay | Admitting: *Deleted

## 2012-03-21 NOTE — Telephone Encounter (Signed)
Message copied by Daphine Deutscher on Thu Mar 21, 2012  9:52 AM ------      Message from: Daphine Deutscher      Created: Fri Mar 08, 2012 10:11 AM       Did we get results from patient PCP for DB (CBC)

## 2012-03-21 NOTE — Telephone Encounter (Signed)
Patient notified and she will have her PCP fax results.

## 2012-03-21 NOTE — Telephone Encounter (Signed)
Left a message for patient to call me. We have not received labs from her PCP.

## 2012-08-06 ENCOUNTER — Ambulatory Visit: Payer: Medicare Other | Admitting: Diagnostic Neuroimaging

## 2012-08-13 ENCOUNTER — Other Ambulatory Visit: Payer: Self-pay | Admitting: *Deleted

## 2012-08-13 ENCOUNTER — Ambulatory Visit (INDEPENDENT_AMBULATORY_CARE_PROVIDER_SITE_OTHER): Payer: Medicare Other | Admitting: Diagnostic Neuroimaging

## 2012-08-13 ENCOUNTER — Encounter: Payer: Self-pay | Admitting: Diagnostic Neuroimaging

## 2012-08-13 VITALS — BP 124/72 | HR 81 | Temp 98.7°F | Ht 63.0 in | Wt 176.0 lb

## 2012-08-13 DIAGNOSIS — G629 Polyneuropathy, unspecified: Secondary | ICD-10-CM | POA: Insufficient documentation

## 2012-08-13 DIAGNOSIS — G589 Mononeuropathy, unspecified: Secondary | ICD-10-CM

## 2012-08-13 MED ORDER — GABAPENTIN 300 MG PO CAPS: 300.0000 mg | ORAL_CAPSULE | Freq: Every day | ORAL | Status: DC

## 2012-08-13 NOTE — Progress Notes (Signed)
[facility]  PATIENT: Andrea Byrd DOB: 1945-03-15  REFERRING CLINICIAN: Sikora HISTORY FROM: patient REASON FOR VISIT: new consult   HISTORICAL  CHIEF COMPLAINT:  Chief Complaint  Patient presents with  . Neurologic Problem    Np#7    HISTORY OF PRESENT ILLNESS:   UPDATE 08/13/12: 67 year old right-handed female with history of hypertension, restless leg syndrome, here for evaluation of progressive numbness and tingling in her toes and feet. I previously saw this patient in October 2013 as referred by primary care physician. Now patient is referred to me by orthopedic surgery. Since last visit patient's symptoms have progressed. Now she has numbness and tingling in her bilateral feet up to her ankles. She has intermittent sharp sensations as well. Symptoms are fairly symmetric. No symptoms in her upper legs, lower back, torso, arms or neck.  PRIOR HPI (12/18/11): 67 year old right-handed female with history of hypertension, this is a syndrome, here for evaluation of numbness in her feet for the past 3 months.  Patient describes numbness and tingling in her toes and bottom of her feet. No significant pain. She also feels coldness in her feet. No back pain or symptoms radiating from her back to her feet. No symptoms in her hands or fingers. No neck pain. Symptoms are mild and gradually progressing.  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 She did not have any numbness in the symptoms at that time.  REVIEW OF SYSTEMS: Full 14 system review of systems performed and notable only for RLS and  numbness.  ALLERGIES: Allergies  Allergen Reactions  . Nitrofurantoin     Makes mouth break out  . Propoxyphene-Acetaminophen     REACTION: nausea  . Morphine Itching and Rash    HOME MEDICATIONS: Outpatient Prescriptions Prior to Visit  Medication Sig Dispense Refill  .  aspirin 81 MG tablet Take 81 mg by mouth daily.      . Biotin 5000 MCG TABS Take 1 tablet by mouth daily.      . Cyanocobalamin (VITAMIN B-12) 5000 MCG SUBL Place 1 tablet under the tongue daily.      . cyclobenzaprine (FLEXERIL) 10 MG tablet Take 10 mg by mouth 3 (three) times daily as needed.      . ferrous sulfate 325 (65 FE) MG tablet Take one po TID  90 tablet  3  . hyoscyamine (LEVSIN SL) 0.125 MG SL tablet One or two tablets every 4 hours prn crampy abdomen  30 tablet  1  . losartan-hydrochlorothiazide (HYZAAR) 50-12.5 MG per tablet Take 1 tablet by mouth daily.      . metoprolol (LOPRESSOR) 100 MG tablet Take 50 mg by mouth daily.      Marland Kitchen omeprazole (PRILOSEC) 40 MG capsule Take 40 mg by mouth daily.      . pramipexole (MIRAPEX) 0.25 MG tablet Take 0.75 mg by mouth at bedtime.      . Probiotic Product (PROBIOTIC DAILY PO) Take 1 tablet by mouth daily.      . valACYclovir (VALTREX) 1000 MG tablet Take 1,000 mg by mouth as needed.        No facility-administered medications prior to visit.    PAST MEDICAL HISTORY: Past Medical History  Diagnosis Date  . Esophageal stricture   . Hiatal hernia   . Gastritis   . Cervical cancer 1977  . Hypertension   . Arthritis   . Endometriosis   .  GERD (gastroesophageal reflux disease)   . Diverticulosis   . IBS (irritable bowel syndrome)   . Anemia   . Restless leg syndrome   . Herpes simplex type 1 infection   . Insomnia   . History of colon polyps   . History of gallstones     PAST SURGICAL HISTORY: Past Surgical History  Procedure Laterality Date  . Cholecystectomy  1980  . Abdominal hysterectomy  1998  . Oophorectomy    . Tubal ligation    . Cervical cone biopsy    . Colon resection  2002  . Appendectomy  1998  . Colon surgery      due to endometriosis    FAMILY HISTORY: Family History  Problem Relation Age of Onset  . Colon cancer Neg Hx   . Esophageal cancer Neg Hx   . Rectal cancer Neg Hx   . Stomach cancer Neg Hx    . Diabetes Mother   . Stroke Mother     brother, sister, MGM  . Colon polyps Mother   . Heart disease Father   . Kidney disease Father   . Colon polyps Father   . Kidney cancer Sister   . Colon polyps Sister   . Celiac disease Other     niece  . Diabetes Sister   . Heart attack Paternal Grandfather     SOCIAL HISTORY:  History   Social History  . Marital Status: Married    Spouse Name: N/A    Number of Children: 1  . Years of Education: N/A   Occupational History  . Not on file.   Social History Main Topics  . Smoking status: Never Smoker   . Smokeless tobacco: Never Used  . Alcohol Use: No  . Drug Use: No  . Sexually Active: Not on file   Other Topics Concern  . Not on file   Social History Narrative  . No narrative on file     PHYSICAL EXAM  Filed Vitals:   08/13/12 0901  BP: 124/72  Pulse: 81  Temp: 98.7 F (37.1 C)  TempSrc: Oral  Height: 5\' 3"  (1.6 m)  Weight: 176 lb (79.833 kg)    Not recorded    Body mass index is 31.18 kg/(m^2).  EXAM: General: Patient is awake, alert and in no acute distress.  Well developed and groomed. Neck: Neck is supple. Cardiovascular: No carotid artery bruits.  Heart is regular rate and rhythm with no murmurs.  Neurologic Exam  Mental Status: Awake, alert. Language is fluent and comprehension intact. Cranial Nerves: No evidence of papilledema on funduscopic exam.  Pupils are equal and reactive to light.  Visual fields are full to confrontation.  Conjugate eye movements are full and symmetric.  Facial sensation symmetric.  MID LEFT HEMIFACIAL SPASM (PRIOR LEFT BELL'S PALSY). Hearing is intact.  Palate elevated symmetrically and uvula is midline.  Shoulder shrug is symmetric.  Tongue is midline. Motor: Normal bulk and tone.  Full strength in the upper and lower extremities.  No pronator drift. Sensory: Intact and symmetric to light touch and proprioception; EXCEPT DECR PP IN TOES, IN GRADIENT UP TO SHINS. VIB 8 SEC  AT TOES. DECR TEMP IN RIGHT FOOT. Coordination: No ataxia or dysmetria on finger-nose or rapid alternating movement testing. Gait and Station: Narrow based gait, able to walk on heels and toes.  Tandem gait is stable. Romberg is negative. Reflexes: BUE 2, KNEES 3, RIGHT ANKLE 2, LEFT ANKLE 2+  DIAGNOSTIC DATA (LABS, IMAGING, TESTING) -  I reviewed patient records, labs, notes, testing and imaging myself where available.  Lab Results  Component Value Date   WBC 6.5 01/31/2012   HGB 10.5* 01/31/2012   HCT 33.5* 01/31/2012   MCV 78.8 01/31/2012   PLT 382.0 01/31/2012      Component Value Date/Time   NA 139 01/31/2012 1154   K 3.4* 01/31/2012 1154   CL 103 01/31/2012 1154   CO2 30 01/31/2012 1154   GLUCOSE 90 01/31/2012 1154   BUN 14 01/31/2012 1154   CREATININE 0.9 01/31/2012 1154   CALCIUM 9.3 01/31/2012 1154   PROT 7.1 01/31/2012 1154   ALBUMIN 3.7 01/31/2012 1154   AST 22 01/31/2012 1154   ALT 23 01/31/2012 1154   ALKPHOS 118* 01/31/2012 1154   BILITOT 0.4 01/31/2012 1154   No results found for this basename: CHOL, HDL, LDLCALC, LDLDIRECT, TRIG, CHOLHDL   No results found for this basename: HGBA1C   No results found for this basename: VITAMINB12   No results found for this basename: TSH   TSH 1.35  B12 826  ASSESSMENT AND PLAN  67 y.o. year old female  has a past medical history of Esophageal stricture; Hiatal hernia; Gastritis; Cervical cancer (1977); Hypertension; Arthritis; Endometriosis; GERD (gastroesophageal reflux disease); Diverticulosis; IBS (irritable bowel syndrome); Anemia; Restless leg syndrome; Herpes simplex type 1 infection; Insomnia; History of colon polyps; and History of gallstones. here with progressive numbness in feet.   Ddx: neuropathy, lumbar radiculopathy, thoracic myelitis, metabolic  PLAN: 1. EMG/NCS 2. Based on EMG results, will consider additional labs +/- CNS imaging (c and t spine) 3. Start gabapentin   Orders Placed This  Encounter  Procedures  . NCV with EMG(electromyography)    Meds ordered this encounter  Medications  . gabapentin (NEURONTIN) 300 MG capsule    Sig: Take 1 capsule (300 mg total) by mouth at bedtime.    Dispense:  30 capsule    Refill:  12     Andrea Marker, MD 08/13/2012, 9:41 AM Certified in Neurology, Neurophysiology and Neuroimaging  [facility] [address] [place], [place] [address] [phone]

## 2012-08-13 NOTE — Patient Instructions (Addendum)
I will check EMG/NCS (electrical nerve test).  I will start gabapentin 300mg  at bedtime (for numbness, pain and restless legs).

## 2012-08-27 ENCOUNTER — Encounter (INDEPENDENT_AMBULATORY_CARE_PROVIDER_SITE_OTHER): Payer: Medicare Other | Admitting: Radiology

## 2012-08-27 ENCOUNTER — Ambulatory Visit (INDEPENDENT_AMBULATORY_CARE_PROVIDER_SITE_OTHER): Payer: Medicare Other | Admitting: Diagnostic Neuroimaging

## 2012-08-27 DIAGNOSIS — G629 Polyneuropathy, unspecified: Secondary | ICD-10-CM

## 2012-08-27 DIAGNOSIS — M5416 Radiculopathy, lumbar region: Secondary | ICD-10-CM

## 2012-08-27 DIAGNOSIS — IMO0002 Reserved for concepts with insufficient information to code with codable children: Secondary | ICD-10-CM

## 2012-08-27 DIAGNOSIS — Z0289 Encounter for other administrative examinations: Secondary | ICD-10-CM

## 2012-08-30 NOTE — Procedures (Signed)
   [facility]  NCS (NERVE CONDUCTION STUDY) WITH EMG (ELECTROMYOGRAPHY) REPORT   STUDY DATE: 08/27/12 PATIENT NAME: Andrea Byrd DOB: 09-28-1945 MRN: [id number]  ORDERING CLINICIAN: Joycelyn Schmid, MD   TECHNOLOGIST: Kaylyn Lim ELECTROMYOGRAPHER: Glenford Bayley. Adellyn Capek, MD  CLINICAL INFORMATION: 67 year old female with bilateral lower extremity numbness and pain.  FINDINGS: NERVE CONDUCTION STUDY: Left median motor response has prolonged distal latency (5.2 ms) decreased amplitude, normal conduction velocity and normal F-wave latency. Left ulnar and bilateral peroneal motor responses and F-wave latencies are normal. Bilateral tibial motor responses have normal distal latencies, decreased amplitudes, normal conduction velocities and normal F-wave latencies.  Left median sensory response has normal amplitude and slow conduction velocity (39 m/s) left ulnar sensory response is normal. Right sural sensory response has decreased amplitude and normal conduction velocity. Left sural sensory response is normal. Bilateral medial and lateral plantar sensory responses could not be obtained.  NEEDLE ELECTROMYOGRAPHY: Needle examination of selected muscles of left lower extremity (vastus medialis, tibialis anterior, gastrocnemius) shows 1+ positive sharp waves and fibrillation potentials in the left gastrocnemius muscle at rest and normal motor unit recruitment on exertion. Left L4-5 paraspinal muscles demonstrate complex repetitive discharges. Right L5-S1 paraspinal muscles demonstrate complex repetitive discharges. Left L5-S1 paraspinal muscles demonstrate 1+ positive sharp waves and complex repetitive discharges.  IMPRESSION:  Abnormal study demonstrating: 1. Electrodiagnostic evidence of bilateral S1 radiculopathies. 2. Left median neuropathy at the wrist consistent with left carpal tunnel syndrome.   INTERPRETING PHYSICIAN:  Suanne Marker, MD Certified in Neurology,  Neurophysiology and Neuroimaging  [facility] [address] [place], [place] [address] [phone]

## 2012-09-18 ENCOUNTER — Ambulatory Visit
Admission: RE | Admit: 2012-09-18 | Discharge: 2012-09-18 | Disposition: A | Discharge status: Home / Self Care | Payer: Medicare Other | Source: Ambulatory Visit | Attending: Diagnostic Neuroimaging | Admitting: Diagnostic Neuroimaging

## 2012-09-18 DIAGNOSIS — M5416 Radiculopathy, lumbar region: Secondary | ICD-10-CM

## 2012-09-18 DIAGNOSIS — IMO0002 Reserved for concepts with insufficient information to code with codable children: Secondary | ICD-10-CM

## 2012-09-23 ENCOUNTER — Telehealth: Payer: Self-pay | Admitting: Diagnostic Neuroimaging

## 2012-09-23 NOTE — Telephone Encounter (Signed)
I called and spoke with the patient to inform her that her results were not in and to callback tomorrow morning and I will see about her results.

## 2012-09-24 ENCOUNTER — Telehealth: Payer: Self-pay | Admitting: Diagnostic Neuroimaging

## 2012-09-24 NOTE — Telephone Encounter (Signed)
Patient calling for her MRI results

## 2012-10-07 ENCOUNTER — Telehealth: Payer: Self-pay | Admitting: *Deleted

## 2012-10-07 NOTE — Telephone Encounter (Signed)
Message copied by Hermenia Fiscal on Mon Oct 07, 2012  1:29 PM ------      Message from: Seth Bake      Created: Fri Oct 04, 2012  5:01 PM      Contact: Patient       Please call with MRI results.  Very anxious.  It has been 2 weeks.  Please call.   ------

## 2012-10-08 NOTE — Telephone Encounter (Signed)
I called pt and gave her the results of MRI.  She has had good results with gabapentin 300mg  po qhs. She asked her numbness in her feet.   I told her that if he gave her instructions about increasing the gabapentin , she may try this and see id this makes a difference.  If not then to call us back.  She relayed understanding.   I would send this note and if any change would call her back.

## 2012-10-08 NOTE — Telephone Encounter (Signed)
I called pt. No answer on both numbers.  Please call patient and give MRI results. Mild degenerative changes. I recommend conservative management, continue gabapentin.   -VRP

## 2012-11-12 ENCOUNTER — Other Ambulatory Visit: Payer: Self-pay | Admitting: Internal Medicine

## 2012-11-14 ENCOUNTER — Telehealth: Payer: Self-pay | Admitting: *Deleted

## 2012-11-14 DIAGNOSIS — D509 Iron deficiency anemia, unspecified: Secondary | ICD-10-CM

## 2012-11-14 NOTE — Telephone Encounter (Signed)
Message copied by Richardson Chiquito on Thu Nov 14, 2012 10:48 AM ------      Message from: Hart Carwin      Created: Thu Nov 14, 2012 12:02 AM       Please obtain CBC and Iron studies first. thanx DB      ----- Message -----         From: Richardson Chiquito, CMA         Sent: 11/12/2012   5:00 PM           To: Hart Carwin, MD            Patient requests refills on ferrous sulfate. Her last labs were apparently in 03/2012. Does she need repeat labs first or shall I continue to refill?       ------

## 2012-11-14 NOTE — Telephone Encounter (Signed)
Left message for patient to call back  

## 2012-11-15 NOTE — Telephone Encounter (Signed)
Patient advised she needs labs before we refill her iron. She verbalizes understanding.

## 2012-11-25 ENCOUNTER — Other Ambulatory Visit (INDEPENDENT_AMBULATORY_CARE_PROVIDER_SITE_OTHER): Payer: Medicare Other

## 2012-11-25 DIAGNOSIS — D509 Iron deficiency anemia, unspecified: Secondary | ICD-10-CM

## 2012-11-25 LAB — CBC WITH DIFFERENTIAL/PLATELET
Basophils Relative: 0.5 % (ref 0.0–3.0)
Eosinophils Absolute: 0.1 10*3/uL (ref 0.0–0.7)
Hemoglobin: 12.7 g/dL (ref 12.0–15.0)
Lymphs Abs: 2.4 10*3/uL (ref 0.7–4.0)
MCHC: 33.8 g/dL (ref 30.0–36.0)
MCV: 87.8 fl (ref 78.0–100.0)
Monocytes Absolute: 0.4 10*3/uL (ref 0.1–1.0)
Neutro Abs: 3.7 10*3/uL (ref 1.4–7.7)
RBC: 4.28 Mil/uL (ref 3.87–5.11)

## 2012-11-26 ENCOUNTER — Other Ambulatory Visit: Payer: Self-pay | Admitting: *Deleted

## 2012-11-26 DIAGNOSIS — D509 Iron deficiency anemia, unspecified: Secondary | ICD-10-CM

## 2013-01-17 ENCOUNTER — Other Ambulatory Visit: Payer: Self-pay | Admitting: Internal Medicine

## 2013-01-17 ENCOUNTER — Telehealth: Payer: Self-pay | Admitting: *Deleted

## 2013-01-17 DIAGNOSIS — G629 Polyneuropathy, unspecified: Secondary | ICD-10-CM

## 2013-01-17 MED ORDER — GABAPENTIN 300 MG PO CAPS: 300.0000 mg | ORAL_CAPSULE | Freq: Three times a day (TID) | ORAL | Status: DC

## 2013-01-17 NOTE — Telephone Encounter (Signed)
I called patient. Advised to increase gabapentin to 300mg  TID. -VRP

## 2013-01-17 NOTE — Telephone Encounter (Signed)
Called to get more information, lt VM message for call back

## 2013-01-22 ENCOUNTER — Other Ambulatory Visit: Payer: Self-pay

## 2013-01-22 DIAGNOSIS — Z1231 Encounter for screening mammogram for malignant neoplasm of breast: Secondary | ICD-10-CM

## 2013-02-19 ENCOUNTER — Ambulatory Visit
Admission: RE | Admit: 2013-02-19 | Discharge: 2013-02-19 | Disposition: A | Discharge status: Home / Self Care | Payer: Medicare Other | Source: Ambulatory Visit

## 2013-02-19 ENCOUNTER — Ambulatory Visit (INDEPENDENT_AMBULATORY_CARE_PROVIDER_SITE_OTHER): Payer: Medicare Other

## 2013-02-19 VITALS — BP 104/65 | HR 84 | Resp 16 | Ht 63.0 in | Wt 175.0 lb

## 2013-02-19 DIAGNOSIS — Z1231 Encounter for screening mammogram for malignant neoplasm of breast: Secondary | ICD-10-CM

## 2013-02-19 DIAGNOSIS — M79609 Pain in unspecified limb: Secondary | ICD-10-CM

## 2013-02-19 DIAGNOSIS — M722 Plantar fascial fibromatosis: Secondary | ICD-10-CM

## 2013-02-19 DIAGNOSIS — M79671 Pain in right foot: Secondary | ICD-10-CM

## 2013-02-19 MED ORDER — MELOXICAM 15 MG PO TABS: 15.0000 mg | ORAL_TABLET | Freq: Every day | ORAL | Status: DC

## 2013-02-19 NOTE — Progress Notes (Signed)
   Subjective:    Patient ID: Andrea Byrd, female    DOB: 06-Oct-1945, 67 y.o.   MRN: [id number] "About 2 weeks ago this right foot got really sore and it hasn't gotten any better."  Foot Pain This is a new (Heel Pain Right) problem. Episode onset: 2 weeks. The problem occurs constantly. The problem has been unchanged. Associated symptoms include numbness. The symptoms are aggravated by walking and standing (barefoot). She has tried nothing for the symptoms.      Review of Systems  Neurological: Positive for numbness.  All other systems reviewed and are negative.       Objective:   Physical Exam Neurovascular status is intact and unchanged. Patient has for the last 2 weeks pain in the inferior aspect of right heel right arch M.D. pain with motion first up in the morning or getting up long period of rest. No history of injury trauma is noted. Patient wears a cloth or athletic-type shoes at most times. There is some tenderness and posterior calcaneal area patient does have a history of neuropathy which is being treated with gabapentin this time dysmorphic aching pain and decreased IP the inferior heel pain for first up in the morning getting out of 5. Rest is indicated x-rays reveal no fracture in mild inferior and slight retrocalcaneal spurring noted no other osseous abnormalities or fractures identified to       Assessment & Plan:  Plantar fasciitis/heel spur syndrome right foot with possible history of neuropathy overlying the symptoms. At this time patient placed in a fascial strapping right foot. Also a prescription for Mobic as given as alternative using Aleve or ibuprofen over-the-counter recommended ice to the heel as well reappointed in the next 3-4 weeks or as needed for followup if symptoms persist consider steroid injection possible followup with orthoses as needed.  Alvan Dame DP

## 2013-02-19 NOTE — Patient Instructions (Signed)
Plantar Fasciitis Plantar fasciitis is a common condition that causes foot pain. It is soreness (inflammation) of the band of tough fibrous tissue on the bottom of the foot that runs from the heel bone (calcaneus) to the ball of the foot. The cause of this soreness may be from excessive standing, poor fitting shoes, running on hard surfaces, being overweight, having an abnormal walk, or overuse (this is common in runners) of the painful foot or feet. It is also common in aerobic exercise dancers and ballet dancers. SYMPTOMS  Most people with plantar fasciitis complain of:  Severe pain in the morning on the bottom of their foot especially when taking the first steps out of bed. This pain recedes after a few minutes of walking.  Severe pain is experienced also during walking following a long period of inactivity.  Pain is worse when walking barefoot or up stairs DIAGNOSIS   Your caregiver will diagnose this condition by examining and feeling your foot.  Special tests such as X-rays of your foot, are usually not needed. PREVENTION   Consult a sports medicine professional before beginning a new exercise program.  Walking programs offer a good workout. With walking there is a lower chance of overuse injuries common to runners. There is less impact and less jarring of the joints.  Begin all new exercise programs slowly. If problems or pain develop, decrease the amount of time or distance until you are at a comfortable level.  Wear good shoes and replace them regularly.  Stretch your foot and the heel cords at the back of the ankle (Achilles tendon) both before and after exercise.  Run or exercise on even surfaces that are not hard. For example, asphalt is better than pavement.  Do not run barefoot on hard surfaces.  If using a treadmill, vary the incline.  Do not continue to workout if you have foot or joint problems. Seek professional help if they do not improve. HOME CARE INSTRUCTIONS     Avoid activities that cause you pain until you recover.  Use ice or cold packs on the problem or painful areas after working out.  Only take over-the-counter or prescription medicines for pain, discomfort, or fever as directed by your caregiver.  Soft shoe inserts or athletic shoes with air or gel sole cushions may be helpful.  If problems continue or become more severe, consult a sports medicine caregiver or your own health care provider. Cortisone is a potent anti-inflammatory medication that may be injected into the painful area. You can discuss this treatment with your caregiver. MAKE SURE YOU:   Understand these instructions.  Will watch your condition.  Will get help right away if you are not doing well or get worse. Document Released: 11/15/2000 Document Revised: 05/15/2011 Document Reviewed: 01/15/2008 [employer] Patient Information 2014 [employer] ICE INSTRUCTIONS  Apply ice or cold pack to the affected area at least 3 times a day for 10-15 minutes each time.  You should also use ice after prolonged activity or vigorous exercise.  Do not apply ice longer than 20 minutes at one time.  Always keep a cloth between your skin and the ice pack to prevent burns.  Being consistent and following these instructions will help control your symptoms.  We suggest you purchase a gel ice pack because they are reusable and do bit leak.  Some of them are designed to wrap around the area.  Use the method that works best for you.  Here are some other suggestions for   icing.   Use a frozen bag of peas or corn-inexpensive and molds well to your body, usually stays frozen for 10 to 20 minutes.  Wet a towel with cold water and squeeze out the excess until it's damp.  Place in a bag in the freezer for 20 minutes. Then remove and use.

## 2013-02-20 ENCOUNTER — Telehealth: Payer: Self-pay | Admitting: *Deleted

## 2013-02-20 NOTE — Telephone Encounter (Signed)
Message copied by Daphine Deutscher on Thu Feb 20, 2013 10:04 AM ------      Message from: Daphine Deutscher      Created: Tue Nov 26, 2012  1:38 PM       Call and remind due for CBC, iron panel DB on 02/24/13 ------

## 2013-02-20 NOTE — Telephone Encounter (Signed)
Spoke with patient and she will come for labs. 

## 2013-02-20 NOTE — Telephone Encounter (Signed)
Left a message for patient to call me. 

## 2013-03-11 ENCOUNTER — Encounter: Payer: Self-pay | Admitting: *Deleted

## 2013-03-25 ENCOUNTER — Telehealth: Payer: Self-pay | Admitting: *Deleted

## 2013-03-25 NOTE — Telephone Encounter (Signed)
Patient was due for repeat CBC, iron studies in December. Called patient and mailed a letter to her. She has not had labs done.

## 2013-03-31 ENCOUNTER — Telehealth: Payer: Self-pay | Admitting: Internal Medicine

## 2013-03-31 NOTE — Telephone Encounter (Signed)
Noted. Will watch for labs to be faxed.

## 2013-04-23 ENCOUNTER — Telehealth: Payer: Self-pay | Admitting: Internal Medicine

## 2013-04-23 NOTE — Telephone Encounter (Signed)
Left a message for patient to call me. 

## 2013-04-23 NOTE — Telephone Encounter (Signed)
Patient states she will come in for labs this week.

## 2013-04-25 ENCOUNTER — Other Ambulatory Visit (INDEPENDENT_AMBULATORY_CARE_PROVIDER_SITE_OTHER): Payer: Medicare Other

## 2013-04-25 DIAGNOSIS — D509 Iron deficiency anemia, unspecified: Secondary | ICD-10-CM

## 2013-04-25 LAB — CBC WITH DIFFERENTIAL/PLATELET
BASOS PCT: 0.6 % (ref 0.0–3.0)
Basophils Absolute: 0.1 10*3/uL (ref 0.0–0.1)
EOS PCT: 1.4 % (ref 0.0–5.0)
Eosinophils Absolute: 0.1 10*3/uL (ref 0.0–0.7)
HCT: 42.9 % (ref 36.0–46.0)
Hemoglobin: 14.1 g/dL (ref 12.0–15.0)
LYMPHS PCT: 30.3 % (ref 12.0–46.0)
Lymphs Abs: 2.5 10*3/uL (ref 0.7–4.0)
MCHC: 32.8 g/dL (ref 30.0–36.0)
MCV: 93 fl (ref 78.0–100.0)
Monocytes Absolute: 0.5 10*3/uL (ref 0.1–1.0)
Monocytes Relative: 6.3 % (ref 3.0–12.0)
NEUTROS PCT: 61.4 % (ref 43.0–77.0)
Neutro Abs: 5.1 10*3/uL (ref 1.4–7.7)
PLATELETS: 324 10*3/uL (ref 150.0–400.0)
RBC: 4.61 Mil/uL (ref 3.87–5.11)
RDW: 14.2 % (ref 11.5–14.6)
WBC: 8.3 10*3/uL (ref 4.5–10.5)

## 2013-04-25 LAB — IBC PANEL
IRON: 65 ug/dL (ref 42–145)
SATURATION RATIOS: 15.9 % — AB (ref 20.0–50.0)
TRANSFERRIN: 292.9 mg/dL (ref 212.0–360.0)

## 2013-11-17 ENCOUNTER — Telehealth: Payer: Self-pay | Admitting: Internal Medicine

## 2013-11-17 NOTE — Telephone Encounter (Signed)
Patient calling because her urologist told her to see her GI. Per patient she has a prolapsed bladder and is incontinent of stool. She reports she has had this for 9 months. She was incontinent of stool prior to her bladder problems but was embarrassed to tell anyone. Scheduled with Dr. Olevia Perches on 11/18/13 at 11:00 AM.

## 2013-11-18 ENCOUNTER — Other Ambulatory Visit (INDEPENDENT_AMBULATORY_CARE_PROVIDER_SITE_OTHER): Payer: Medicare Other

## 2013-11-18 ENCOUNTER — Ambulatory Visit (INDEPENDENT_AMBULATORY_CARE_PROVIDER_SITE_OTHER): Payer: Medicare Other | Admitting: Internal Medicine

## 2013-11-18 ENCOUNTER — Encounter: Payer: Self-pay | Admitting: Internal Medicine

## 2013-11-18 VITALS — BP 114/70 | HR 72 | Ht 62.5 in | Wt 182.0 lb

## 2013-11-18 DIAGNOSIS — N816 Rectocele: Secondary | ICD-10-CM

## 2013-11-18 DIAGNOSIS — D649 Anemia, unspecified: Secondary | ICD-10-CM

## 2013-11-18 DIAGNOSIS — R159 Full incontinence of feces: Secondary | ICD-10-CM

## 2013-11-18 LAB — IBC PANEL
Iron: 73 ug/dL (ref 42–145)
SATURATION RATIOS: 19.3 % — AB (ref 20.0–50.0)
TRANSFERRIN: 270.7 mg/dL (ref 212.0–360.0)

## 2013-11-18 MED ORDER — HYDROCORTISONE ACETATE 25 MG RE SUPP: 25.0000 mg | Freq: Every day | RECTAL | Status: DC

## 2013-11-18 NOTE — Patient Instructions (Signed)
Your physician has requested that you go to the basement for the following lab work before leaving today: CBC,IBC  We have sent the following medications to your pharmacy for you to pick up at your convenience: Anusol  Please purchase the following medications over the counter and take as directed: Benefiber 1 tablespoon daily  We are in the process of getting an appointment for you to see Dr Edwinna Areola. Her office is located at [address], [place], [place] [address] Phone [phone]  CC: Dr Juanita Craver, Dr Edwinna Areola

## 2013-11-18 NOTE — Progress Notes (Signed)
Andrea Byrd 12-14-1945 [id number]  Note: This dictation was prepared with Dragon digital system. Any transcriptional errors that result from this procedure are unintentional.   History of Present Illness: This is a 68 year old white female with history of irritable bowel syndrome and symptomatic diverticulosis. Who has been having leakage of small amount of stool and mucus for past several months necessitating use Depends.she has a history of abdominal hysterectomy and bladder repair in 1998. She underwent segmental sigmoid resection for symptomatic diverticulitis in 2002 by Dr. Margot Chimes. She has not been evaluated again for recurrent bladder prolapse. She denies being constipated. She has small amount of stool leaking to her underwear in addition to having 2-3 irregular bowel movements. She denies abdominal pain other than periodic left lower quadrant discomfort related to irritable bowel syndrome. Last colonoscopy in December 2013 showed widely patent sigmoid anastomosis at 20 cm. She had mild diverticulosis and questionable AVM in the right colon. Prior colonoscopies were in 1987, 2000 and 2005. She was evaluated for iron deficiency anemia in December 2013 and has been on iron supplements. No definite cause of bleeding was found. Upper endoscopy in December 2013 show small hiatal hernia with stricture which was dilated with 14,15 and 16 mm dilators    Past Medical History  Diagnosis Date  . Esophageal stricture   . Hiatal hernia   . Gastritis   . Cervical cancer 1977  . Hypertension   . Arthritis   . Endometriosis   . GERD (gastroesophageal reflux disease)   . Diverticulosis   . IBS (irritable bowel syndrome)   . Anemia   . Restless leg syndrome   . Herpes simplex type 1 infection   . Insomnia   . History of colon polyps   . History of gallstones   . Glaucoma     Past Surgical History  Procedure Laterality Date  . Cholecystectomy  1980  . Abdominal hysterectomy  1998  .  Oophorectomy    . Tubal ligation    . Cervical cone biopsy    . Colon resection  2002  . Appendectomy  1998  . Colon surgery         . Hand surgery      right    Allergies  Allergen Reactions  . Nitrofurantoin     Makes mouth break out  . Propoxyphene N-Acetaminophen     REACTION: nausea  . Morphine Itching and Rash    Family history and social history have been reviewed.  Review of Systems: Negative for rectal bleeding. Positive for weight gain  The remainder of the 10 point ROS is negative except as outlined in the H&P  Physical Exam: General Appearance Well developed, in no distress Eyes  Non icteric  HEENT  Non traumatic, normocephalic  Mouth No lesion, tongue papillated, no cheilosis Neck Supple without adenopathy, thyroid not enlarged, no carotid bruits, no JVD Lungs Clear to auscultation bilaterally COR Normal S1, normal S2, regular rhythm, no murmur, quiet precordium Abdomen mildly protuberant. Soft tender in left lower quadrant. No mass or fullness. Normoactive bowel sounds. Rectal and anoscopic exam reveals all normal perianal area. Normal to slightly decreased rectal sphincter tone. Small first grade internal hemorrhoids not bleeding. Small amount of residual stool in the rectal ampulla. No evidence of prolapse. Stool is Hemoccult negative Extremities  No pedal edema Skin No lesions Neurological Alert and oriented x 3 Psychological Normal mood and affect  Assessment and Plan:   68 year old white female with the stool seepage in small  amounts most likely secondary to rectocele. She has small internal hemorrhage which will be treated with Anusol-HC suppositories for next 10 days. She will start of Benefiber one heaping teaspoon daily for irritable bowel syndrome. She is up-to-date on the colonoscopy. We have discussed possibly do it over about evaluating her for rectocele to determine if she needs a repair of the bladder to gather with the rectum which would be  done by her urologist. I will refer her for gynecological evaluation to see if there is any evidence of pelvic relaxation that would justify a rectocele repair which may help with stool leakage  History of iron deficiency anemia. Will check her blood count and iron studies    Delfin Edis 11/18/2013

## 2013-11-19 ENCOUNTER — Telehealth: Payer: Self-pay | Admitting: Internal Medicine

## 2013-11-19 LAB — CBC WITH DIFFERENTIAL/PLATELET
BASOS ABS: 0.2 10*3/uL — AB (ref 0.0–0.1)
BASOS PCT: 2.4 % (ref 0.0–3.0)
EOS ABS: 0.3 10*3/uL (ref 0.0–0.7)
Eosinophils Relative: 4.1 % (ref 0.0–5.0)
HCT: 39.3 % (ref 36.0–46.0)
HEMOGLOBIN: 13.2 g/dL (ref 12.0–15.0)
LYMPHS PCT: 28.8 % (ref 12.0–46.0)
Lymphs Abs: 1.9 10*3/uL (ref 0.7–4.0)
MCHC: 33.6 g/dL (ref 30.0–36.0)
MCV: 91.4 fl (ref 78.0–100.0)
MONOS PCT: 5 % (ref 3.0–12.0)
Monocytes Absolute: 0.3 10*3/uL (ref 0.1–1.0)
NEUTROS ABS: 3.9 10*3/uL (ref 1.4–7.7)
NEUTROS PCT: 59.7 % (ref 43.0–77.0)
Platelets: 271 10*3/uL (ref 150.0–400.0)
RBC: 4.3 Mil/uL (ref 3.87–5.11)
RDW: 14 % (ref 11.5–15.5)
WBC: 6.5 10*3/uL (ref 4.0–10.5)

## 2013-11-19 NOTE — Telephone Encounter (Signed)
Left message to call back. I have gotten an appointment for patient to see Dr Radene Knee on 12/10/13 @ 10:20 am with 10:00 am arrival (Dr Ammie Ferrier office does not take patient's insurance). Dr [employer] office is located at [address]  [place], [place] [address] phone [phone]

## 2013-11-20 ENCOUNTER — Telehealth: Payer: Self-pay | Admitting: Internal Medicine

## 2013-11-20 NOTE — Telephone Encounter (Signed)
Left message for patient to call back  

## 2013-11-20 NOTE — Telephone Encounter (Signed)
Left a message for patient to call back. 

## 2013-11-21 ENCOUNTER — Other Ambulatory Visit: Payer: Self-pay | Admitting: *Deleted

## 2013-11-21 DIAGNOSIS — D509 Iron deficiency anemia, unspecified: Secondary | ICD-10-CM

## 2013-11-21 NOTE — Telephone Encounter (Signed)
Results given.

## 2013-11-24 NOTE — Telephone Encounter (Signed)
I have spoken to patient and have advised her of appointment with Dr Radene Knee on 12/10/13 @ 10:20 am. She verbalizes understanding.

## 2013-12-05 ENCOUNTER — Telehealth: Payer: Self-pay | Admitting: Internal Medicine

## 2013-12-05 MED ORDER — HYOSCYAMINE SULFATE 0.125 MG SL SUBL: 0.1250 mg | SUBLINGUAL_TABLET | SUBLINGUAL | Status: DC | PRN

## 2013-12-05 NOTE — Telephone Encounter (Signed)
RX sent

## 2013-12-10 ENCOUNTER — Other Ambulatory Visit: Payer: Self-pay

## 2013-12-10 ENCOUNTER — Other Ambulatory Visit: Payer: Self-pay | Admitting: Obstetrics and Gynecology

## 2013-12-10 DIAGNOSIS — Z1239 Encounter for other screening for malignant neoplasm of breast: Secondary | ICD-10-CM

## 2013-12-10 DIAGNOSIS — Z1231 Encounter for screening mammogram for malignant neoplasm of breast: Secondary | ICD-10-CM

## 2013-12-11 LAB — CYTOLOGY - PAP

## 2014-02-20 ENCOUNTER — Ambulatory Visit
Admission: RE | Admit: 2014-02-20 | Discharge: 2014-02-20 | Disposition: A | Discharge status: Home / Self Care | Payer: Medicare Other | Source: Ambulatory Visit

## 2014-02-20 DIAGNOSIS — Z1231 Encounter for screening mammogram for malignant neoplasm of breast: Secondary | ICD-10-CM

## 2014-03-12 DIAGNOSIS — Z4789 Encounter for other orthopedic aftercare: Secondary | ICD-10-CM | POA: Diagnosis not present

## 2014-03-12 DIAGNOSIS — G5602 Carpal tunnel syndrome, left upper limb: Secondary | ICD-10-CM | POA: Diagnosis not present

## 2014-03-16 ENCOUNTER — Other Ambulatory Visit: Payer: Self-pay | Admitting: Internal Medicine

## 2014-03-26 DIAGNOSIS — Z4789 Encounter for other orthopedic aftercare: Secondary | ICD-10-CM | POA: Diagnosis not present

## 2014-04-02 DIAGNOSIS — M1812 Unilateral primary osteoarthritis of first carpometacarpal joint, left hand: Secondary | ICD-10-CM | POA: Diagnosis not present

## 2014-04-06 DIAGNOSIS — M25532 Pain in left wrist: Secondary | ICD-10-CM | POA: Diagnosis not present

## 2014-04-06 DIAGNOSIS — Z967 Presence of other bone and tendon implants: Secondary | ICD-10-CM | POA: Diagnosis not present

## 2014-04-06 DIAGNOSIS — Z471 Aftercare following joint replacement surgery: Secondary | ICD-10-CM | POA: Diagnosis not present

## 2014-04-06 DIAGNOSIS — Z96692 Finger-joint replacement of left hand: Secondary | ICD-10-CM | POA: Diagnosis not present

## 2014-04-14 ENCOUNTER — Other Ambulatory Visit: Payer: Self-pay | Admitting: Diagnostic Neuroimaging

## 2014-04-15 NOTE — Telephone Encounter (Signed)
Patient hasn't been seen in over 1 year.  I called, got no answer.  Left message.

## 2014-04-16 DIAGNOSIS — Z471 Aftercare following joint replacement surgery: Secondary | ICD-10-CM | POA: Diagnosis not present

## 2014-04-16 DIAGNOSIS — Z96692 Finger-joint replacement of left hand: Secondary | ICD-10-CM | POA: Diagnosis not present

## 2014-04-16 DIAGNOSIS — Z967 Presence of other bone and tendon implants: Secondary | ICD-10-CM | POA: Diagnosis not present

## 2014-04-16 NOTE — Telephone Encounter (Signed)
Appt. Scheduled.

## 2014-04-17 ENCOUNTER — Ambulatory Visit (INDEPENDENT_AMBULATORY_CARE_PROVIDER_SITE_OTHER): Payer: Medicare Other | Admitting: Diagnostic Neuroimaging

## 2014-04-17 ENCOUNTER — Encounter: Payer: Self-pay | Admitting: Diagnostic Neuroimaging

## 2014-04-17 VITALS — BP 128/87 | HR 68 | Ht 63.0 in | Wt 185.0 lb

## 2014-04-17 DIAGNOSIS — M5416 Radiculopathy, lumbar region: Secondary | ICD-10-CM

## 2014-04-17 DIAGNOSIS — G2581 Restless legs syndrome: Secondary | ICD-10-CM | POA: Diagnosis not present

## 2014-04-17 MED ORDER — GABAPENTIN 300 MG PO CAPS: 300.0000 mg | ORAL_CAPSULE | Freq: Three times a day (TID) | ORAL | Status: DC

## 2014-04-17 MED ORDER — PRAMIPEXOLE DIHYDROCHLORIDE 1 MG PO TABS: 1.0000 mg | ORAL_TABLET | Freq: Two times a day (BID) | ORAL | Status: DC

## 2014-04-17 NOTE — Progress Notes (Signed)
[facility]  PATIENT: Andrea Byrd DOB: Aug 04, 1945  REFERRING CLINICIAN: [place] HISTORY FROM: patient REASON FOR VISIT: follow up   HISTORICAL  CHIEF COMPLAINT:  Chief Complaint  Patient presents with  . Follow-up    restless leg    HISTORY OF PRESENT ILLNESS:   UPDATE 04/17/14: Since last visit, symptoms have continued. Numbness in feet, restless legs at night. On gabapentin 300mg  TID. PCP also tried her on pramipexole 0.25mg x3 at bedtime.   UPDATE 08/13/12: 69 year old right-handed female with history of hypertension, restless leg syndrome, here for evaluation of progressive numbness and tingling in her toes and feet. I previously saw this patient in October 2013 as referred by primary care physician. Now patient is referred to me by orthopedic surgery. Since last visit patient's symptoms have progressed. Now she has numbness and tingling in her bilateral feet up to her ankles. She has intermittent sharp sensations as well. Symptoms are fairly symmetric. No symptoms in her upper legs, lower back, torso, arms or neck.  PRIOR HPI (12/18/11): 69 year old right-handed female with history of hypertension, this is a syndrome, here for evaluation of numbness in her feet for the past 3 months. Patient describes numbness and tingling in her toes and bottom of her feet. No significant pain. She also feels coldness in her feet. No back pain or symptoms radiating from her back to her feet. No symptoms in her hands or fingers. No neck pain. Symptoms are mild and gradually progressing. 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 She did not have any numbness in the symptoms at that time.  REVIEW OF SYSTEMS: Full 14 system review of systems performed and notable only for RLS and  numbness.  ALLERGIES: Allergies  Allergen Reactions  . Nitrofurantoin     Makes mouth break out  .  Propoxyphene N-Acetaminophen     REACTION: nausea  . Morphine Itching and Rash    HOME MEDICATIONS: Outpatient Prescriptions Prior to Visit  Medication Sig Dispense Refill  . aspirin 81 MG tablet Take 81 mg by mouth daily.    . Cyanocobalamin (VITAMIN B-12) 5000 MCG SUBL Place 1 tablet under the tongue daily.    . ferrous sulfate 325 (65 FE) MG tablet Take one po TID 90 tablet 3  . hyoscyamine (LEVSIN SL) 0.125 MG SL tablet TAKE 1 TABLET (0.125 MG TOTAL) BY MOUTH EVERY 4 (FOUR) HOURS AS NEEDED. 30 tablet 0  . latanoprost (XALATAN) 0.005 % ophthalmic solution     . losartan-hydrochlorothiazide (HYZAAR) 50-12.5 MG per tablet Take 1 tablet by mouth daily.    . metoprolol (LOPRESSOR) 100 MG tablet Take 50 mg by mouth daily.    Marland Kitchen omeprazole (PRILOSEC) 40 MG capsule Take 40 mg by mouth daily.    . Probiotic Product (PROBIOTIC DAILY PO) Take 1 tablet by mouth daily.    Marland Kitchen gabapentin (NEURONTIN) 300 MG capsule TAKE ONE CAPSULE BY MOUTH 3 TIMES A DAY 90 capsule 0  . cyclobenzaprine (FLEXERIL) 10 MG tablet Take 10 mg by mouth at bedtime.     . valACYclovir (VALTREX) 1000 MG tablet Take 1,000 mg by mouth as needed.     . Biotin 5000 MCG TABS Take 1 tablet by mouth daily.    . hydrocortisone (ANUSOL-HC) 25 MG suppository Place 1 suppository (25 mg total) rectally at bedtime. 12 suppository 0  . metoprolol succinate (TOPROL-XL) 100 MG 24 hr tablet     .  pramipexole (MIRAPEX) 0.25 MG tablet Take 0.75 mg by mouth at bedtime.     No facility-administered medications prior to visit.    PAST MEDICAL HISTORY: Past Medical History  Diagnosis Date  . Esophageal stricture   . Hiatal hernia   . Gastritis   . Cervical cancer 1977  . Hypertension   . Arthritis   . Endometriosis   . GERD (gastroesophageal reflux disease)   . Diverticulosis   . IBS (irritable bowel syndrome)   . Anemia   . Restless leg syndrome   . Herpes simplex type 1 infection   . Insomnia   . History of colon polyps   . History  of gallstones   . Glaucoma     PAST SURGICAL HISTORY: Past Surgical History  Procedure Laterality Date  . Cholecystectomy  1980  . Abdominal hysterectomy  1998  . Oophorectomy    . Tubal ligation    . Cervical cone biopsy    . Colon resection  2002  . Appendectomy  1998  . Colon surgery         . Hand surgery      right    FAMILY HISTORY: Family History  Problem Relation Age of Onset  . Colon cancer Neg Hx   . Esophageal cancer Neg Hx   . Rectal cancer Neg Hx   . Stomach cancer Neg Hx   . Diabetes Mother   . Stroke Mother     brother, sister, MGM  . Colon polyps Mother   . Heart disease Father   . Kidney disease Father   . Colon polyps Father   . Kidney cancer Sister   . Colon polyps Sister   . Celiac disease Other     niece  . Diabetes Sister   . Heart attack Paternal Grandfather     SOCIAL HISTORY:  History   Social History  . Marital Status: Married    Spouse Name: N/A  . Number of Children: 1  . Years of Education: N/A   Occupational History  . Retired    Social History Main Topics  . Smoking status: Never Smoker   . Smokeless tobacco: Never Used  . Alcohol Use: No  . Drug Use: No  . Sexual Activity: Not on file   Other Topics Concern  . Not on file   Social History Narrative   Lives with husband floyd   Drinks maybe 4 sodas a week     PHYSICAL EXAM  Filed Vitals:   04/17/14 0948  BP: 128/87  Pulse: 68  Height: 5\' 3"  (1.6 m)  Weight: 185 lb (83.915 kg)    Not recorded      Body mass index is 32.78 kg/(m^2).  EXAM: General: Patient is awake, alert and in no acute distress.  Well developed and groomed. Neck: Neck is supple. Cardiovascular: No carotid artery bruits.  Heart is regular rate and rhythm with no murmurs.  Neurologic Exam  Mental Status: Awake, alert. Language is fluent and comprehension intact. Cranial Nerves: No evidence of papilledema on funduscopic exam.  Pupils are equal and reactive to light.  Visual fields  are full to confrontation.  Conjugate eye movements are full and symmetric.  Facial sensation symmetric.  MILD LEFT HEMIFACIAL SPASM (PRIOR LEFT BELL'S PALSY). Hearing is intact.  Palate elevated symmetrically and uvula is midline.  Shoulder shrug is symmetric.  Tongue is midline. Motor: Normal bulk and tone.  Full strength in the upper and lower extremities.  No pronator drift.  Sensory: Intact and symmetric to light touch and proprioception; EXCEPT DECR PP IN TOES, IN GRADIENT UP TO SHINS. VIB 8 SEC AT TOES.  Coordination: No ataxia or dysmetria on finger-nose or rapid alternating movement testing. Gait and Station: Narrow based gait, able to walk on heels and toes.  Tandem gait is stable. Romberg is negative. Reflexes: BUE 2, KNEES 3, ANKLES 2   DIAGNOSTIC DATA (LABS, IMAGING, TESTING) - I reviewed patient records, labs, notes, testing and imaging myself where available.  Lab Results  Component Value Date   WBC 6.5 11/18/2013   HGB 13.2 11/18/2013   HCT 39.3 11/18/2013   MCV 91.4 11/18/2013   PLT 271.0 11/18/2013      Component Value Date/Time   NA 139 01/31/2012 1154   K 3.4* 01/31/2012 1154   CL 103 01/31/2012 1154   CO2 30 01/31/2012 1154   GLUCOSE 90 01/31/2012 1154   BUN 14 01/31/2012 1154   CREATININE 0.9 01/31/2012 1154   CALCIUM 9.3 01/31/2012 1154   PROT 7.1 01/31/2012 1154   ALBUMIN 3.7 01/31/2012 1154   AST 22 01/31/2012 1154   ALT 23 01/31/2012 1154   ALKPHOS 118* 01/31/2012 1154   BILITOT 0.4 01/31/2012 1154   No results found for: CHOL No results found for: HGBA1C No results found for: VITAMINB12 No results found for: TSH  TSH 1.35  B12 826  08/27/12 NCV/EMG 1. Electrodiagnostic evidence of bilateral S1 radiculopathies. 2. Left median neuropathy at the wrist consistent with left carpal tunnel syndrome.  09/20/12 MRI lumbar spine (without) demonstrating: 1. At L5-S1: disc bulging with mild-moderate biforaminal foraminal stenosis 2. At L3-4, L4-5: disc  bulging and facet hypertrophy with no spinal stenosis and mild biforaminal narrowing    ASSESSMENT AND PLAN  69 y.o. year old female  has a past medical history of Esophageal stricture; Hiatal hernia; Gastritis; Cervical cancer (1977); Hypertension; Arthritis; Endometriosis; GERD (gastroesophageal reflux disease); Diverticulosis; IBS (irritable bowel syndrome); Anemia; Restless leg syndrome; Herpes simplex type 1 infection; Insomnia; History of colon polyps; History of gallstones; and Glaucoma. here with progressive numbness in feet, likely due to lumbar radiculopathies. Also with associated restless leg symptoms.   Dx: lumbar radiculopathies (S1)  PLAN: 1. Increase gabapentin (300 --> 600mg  TID) 2. Increase pramipexole (up to 1mg  qhs --> BID) 3. Try PT or chiropractic treatements  Return in about 6 months (around 10/16/2014).    Penni Bombard, MD 7/78/2423, 53:61 AM Certified in Neurology, Neurophysiology and Neuroimaging  [facility] [address], [place], [place] [address] [phone]

## 2014-04-17 NOTE — Patient Instructions (Signed)
Gradually may increase gabapentin up to 300-600mg  three times per day.  Gradually may increase pramipexole up to 1mg  twice a day.

## 2014-04-23 DIAGNOSIS — Z96692 Finger-joint replacement of left hand: Secondary | ICD-10-CM | POA: Diagnosis not present

## 2014-04-23 DIAGNOSIS — M1812 Unilateral primary osteoarthritis of first carpometacarpal joint, left hand: Secondary | ICD-10-CM | POA: Diagnosis not present

## 2014-04-23 DIAGNOSIS — Z471 Aftercare following joint replacement surgery: Secondary | ICD-10-CM | POA: Diagnosis not present

## 2014-05-07 DIAGNOSIS — M5431 Sciatica, right side: Secondary | ICD-10-CM | POA: Diagnosis not present

## 2014-05-07 DIAGNOSIS — M9903 Segmental and somatic dysfunction of lumbar region: Secondary | ICD-10-CM | POA: Diagnosis not present

## 2014-05-07 DIAGNOSIS — M5432 Sciatica, left side: Secondary | ICD-10-CM | POA: Diagnosis not present

## 2014-05-11 DIAGNOSIS — M5431 Sciatica, right side: Secondary | ICD-10-CM | POA: Diagnosis not present

## 2014-05-11 DIAGNOSIS — M5432 Sciatica, left side: Secondary | ICD-10-CM | POA: Diagnosis not present

## 2014-05-11 DIAGNOSIS — M9903 Segmental and somatic dysfunction of lumbar region: Secondary | ICD-10-CM | POA: Diagnosis not present

## 2014-05-12 DIAGNOSIS — M5432 Sciatica, left side: Secondary | ICD-10-CM | POA: Diagnosis not present

## 2014-05-12 DIAGNOSIS — M5431 Sciatica, right side: Secondary | ICD-10-CM | POA: Diagnosis not present

## 2014-05-12 DIAGNOSIS — M9903 Segmental and somatic dysfunction of lumbar region: Secondary | ICD-10-CM | POA: Diagnosis not present

## 2014-05-14 DIAGNOSIS — M5432 Sciatica, left side: Secondary | ICD-10-CM | POA: Diagnosis not present

## 2014-05-14 DIAGNOSIS — M5431 Sciatica, right side: Secondary | ICD-10-CM | POA: Diagnosis not present

## 2014-05-14 DIAGNOSIS — M9903 Segmental and somatic dysfunction of lumbar region: Secondary | ICD-10-CM | POA: Diagnosis not present

## 2014-05-18 DIAGNOSIS — M9903 Segmental and somatic dysfunction of lumbar region: Secondary | ICD-10-CM | POA: Diagnosis not present

## 2014-05-18 DIAGNOSIS — M5432 Sciatica, left side: Secondary | ICD-10-CM | POA: Diagnosis not present

## 2014-05-18 DIAGNOSIS — M5431 Sciatica, right side: Secondary | ICD-10-CM | POA: Diagnosis not present

## 2014-05-20 DIAGNOSIS — Z471 Aftercare following joint replacement surgery: Secondary | ICD-10-CM | POA: Diagnosis not present

## 2014-05-20 DIAGNOSIS — M5432 Sciatica, left side: Secondary | ICD-10-CM | POA: Diagnosis not present

## 2014-05-20 DIAGNOSIS — M5431 Sciatica, right side: Secondary | ICD-10-CM | POA: Diagnosis not present

## 2014-05-20 DIAGNOSIS — M9903 Segmental and somatic dysfunction of lumbar region: Secondary | ICD-10-CM | POA: Diagnosis not present

## 2014-05-20 DIAGNOSIS — Z96692 Finger-joint replacement of left hand: Secondary | ICD-10-CM | POA: Diagnosis not present

## 2014-05-21 ENCOUNTER — Telehealth: Payer: Self-pay | Admitting: *Deleted

## 2014-05-21 NOTE — Telephone Encounter (Signed)
-----   Message from Hulan Saas, RN sent at 11/21/2013  9:41 AM EDT ----- Call and remind due for lab for DB on 05/25/14. Lab in epic.

## 2014-05-21 NOTE — Telephone Encounter (Signed)
Left a message for patient to call back. 

## 2014-05-22 ENCOUNTER — Other Ambulatory Visit (INDEPENDENT_AMBULATORY_CARE_PROVIDER_SITE_OTHER): Payer: Medicare Other

## 2014-05-22 DIAGNOSIS — D509 Iron deficiency anemia, unspecified: Secondary | ICD-10-CM

## 2014-05-22 LAB — CBC WITH DIFFERENTIAL/PLATELET
BASOS PCT: 0.6 % (ref 0.0–3.0)
Basophils Absolute: 0.1 10*3/uL (ref 0.0–0.1)
EOS PCT: 1.9 % (ref 0.0–5.0)
Eosinophils Absolute: 0.2 10*3/uL (ref 0.0–0.7)
HCT: 38.5 % (ref 36.0–46.0)
Hemoglobin: 13 g/dL (ref 12.0–15.0)
LYMPHS PCT: 25.9 % (ref 12.0–46.0)
Lymphs Abs: 2.3 10*3/uL (ref 0.7–4.0)
MCHC: 33.8 g/dL (ref 30.0–36.0)
MCV: 88.9 fl (ref 78.0–100.0)
MONOS PCT: 7.3 % (ref 3.0–12.0)
Monocytes Absolute: 0.7 10*3/uL (ref 0.1–1.0)
NEUTROS PCT: 64.3 % (ref 43.0–77.0)
Neutro Abs: 5.8 10*3/uL (ref 1.4–7.7)
PLATELETS: 294 10*3/uL (ref 150.0–400.0)
RBC: 4.33 Mil/uL (ref 3.87–5.11)
RDW: 13.9 % (ref 11.5–15.5)
WBC: 9 10*3/uL (ref 4.0–10.5)

## 2014-05-22 NOTE — Telephone Encounter (Signed)
Patient notified of labs needed. She will come for labs.

## 2014-05-25 DIAGNOSIS — M5432 Sciatica, left side: Secondary | ICD-10-CM | POA: Diagnosis not present

## 2014-05-25 DIAGNOSIS — M5431 Sciatica, right side: Secondary | ICD-10-CM | POA: Diagnosis not present

## 2014-05-25 DIAGNOSIS — M9903 Segmental and somatic dysfunction of lumbar region: Secondary | ICD-10-CM | POA: Diagnosis not present

## 2014-05-25 LAB — IBC PANEL
IRON: 49 ug/dL (ref 42–145)
Saturation Ratios: 13.3 % — ABNORMAL LOW (ref 20.0–50.0)
TRANSFERRIN: 264 mg/dL (ref 212.0–360.0)

## 2014-05-26 ENCOUNTER — Telehealth: Payer: Self-pay | Admitting: Internal Medicine

## 2014-05-26 NOTE — Telephone Encounter (Signed)
Please call pt with normal Hgb but low Iron, Please take Iron once a day, every day.  In place of Levsin.125 , please, send Dicyclomine 10mg  #30, 1 po ac prn IBS , 3 refills

## 2014-05-26 NOTE — Telephone Encounter (Signed)
Patient is asking for her lab results.(these were sent to her neurologist, they are in EPIC). Also, her insurance is denying her Hyoscyamine. She is willing to try something else. Please, advise.

## 2014-05-27 ENCOUNTER — Other Ambulatory Visit: Payer: Self-pay | Admitting: *Deleted

## 2014-05-27 DIAGNOSIS — M5432 Sciatica, left side: Secondary | ICD-10-CM | POA: Diagnosis not present

## 2014-05-27 DIAGNOSIS — D509 Iron deficiency anemia, unspecified: Secondary | ICD-10-CM

## 2014-05-27 DIAGNOSIS — M9903 Segmental and somatic dysfunction of lumbar region: Secondary | ICD-10-CM | POA: Diagnosis not present

## 2014-05-27 DIAGNOSIS — M5431 Sciatica, right side: Secondary | ICD-10-CM | POA: Diagnosis not present

## 2014-05-27 MED ORDER — DICYCLOMINE HCL 10 MG PO CAPS: ORAL_CAPSULE | ORAL | Status: DC

## 2014-05-27 NOTE — Telephone Encounter (Signed)
Patient given results and recommendations. New rx sent to pharmacy.

## 2014-06-02 DIAGNOSIS — M5432 Sciatica, left side: Secondary | ICD-10-CM | POA: Diagnosis not present

## 2014-06-02 DIAGNOSIS — M9903 Segmental and somatic dysfunction of lumbar region: Secondary | ICD-10-CM | POA: Diagnosis not present

## 2014-06-02 DIAGNOSIS — M5431 Sciatica, right side: Secondary | ICD-10-CM | POA: Diagnosis not present

## 2014-06-04 DIAGNOSIS — H401232 Low-tension glaucoma, bilateral, moderate stage: Secondary | ICD-10-CM | POA: Diagnosis not present

## 2014-06-04 DIAGNOSIS — M5432 Sciatica, left side: Secondary | ICD-10-CM | POA: Diagnosis not present

## 2014-06-04 DIAGNOSIS — M5431 Sciatica, right side: Secondary | ICD-10-CM | POA: Diagnosis not present

## 2014-06-04 DIAGNOSIS — M9903 Segmental and somatic dysfunction of lumbar region: Secondary | ICD-10-CM | POA: Diagnosis not present

## 2014-06-09 DIAGNOSIS — M5432 Sciatica, left side: Secondary | ICD-10-CM | POA: Diagnosis not present

## 2014-06-09 DIAGNOSIS — M5431 Sciatica, right side: Secondary | ICD-10-CM | POA: Diagnosis not present

## 2014-06-09 DIAGNOSIS — M9903 Segmental and somatic dysfunction of lumbar region: Secondary | ICD-10-CM | POA: Diagnosis not present

## 2014-06-11 DIAGNOSIS — M9903 Segmental and somatic dysfunction of lumbar region: Secondary | ICD-10-CM | POA: Diagnosis not present

## 2014-06-11 DIAGNOSIS — M5432 Sciatica, left side: Secondary | ICD-10-CM | POA: Diagnosis not present

## 2014-06-11 DIAGNOSIS — M5431 Sciatica, right side: Secondary | ICD-10-CM | POA: Diagnosis not present

## 2014-06-18 DIAGNOSIS — M5432 Sciatica, left side: Secondary | ICD-10-CM | POA: Diagnosis not present

## 2014-06-18 DIAGNOSIS — M5431 Sciatica, right side: Secondary | ICD-10-CM | POA: Diagnosis not present

## 2014-06-18 DIAGNOSIS — M9903 Segmental and somatic dysfunction of lumbar region: Secondary | ICD-10-CM | POA: Diagnosis not present

## 2014-06-25 DIAGNOSIS — M5431 Sciatica, right side: Secondary | ICD-10-CM | POA: Diagnosis not present

## 2014-06-25 DIAGNOSIS — M5432 Sciatica, left side: Secondary | ICD-10-CM | POA: Diagnosis not present

## 2014-06-25 DIAGNOSIS — M9903 Segmental and somatic dysfunction of lumbar region: Secondary | ICD-10-CM | POA: Diagnosis not present

## 2014-06-30 DIAGNOSIS — S80861A Insect bite (nonvenomous), right lower leg, initial encounter: Secondary | ICD-10-CM | POA: Diagnosis not present

## 2014-06-30 DIAGNOSIS — S80862A Insect bite (nonvenomous), left lower leg, initial encounter: Secondary | ICD-10-CM | POA: Diagnosis not present

## 2014-07-02 DIAGNOSIS — M9903 Segmental and somatic dysfunction of lumbar region: Secondary | ICD-10-CM | POA: Diagnosis not present

## 2014-07-02 DIAGNOSIS — M5432 Sciatica, left side: Secondary | ICD-10-CM | POA: Diagnosis not present

## 2014-07-02 DIAGNOSIS — M5431 Sciatica, right side: Secondary | ICD-10-CM | POA: Diagnosis not present

## 2014-07-10 DIAGNOSIS — Z0000 Encounter for general adult medical examination without abnormal findings: Secondary | ICD-10-CM | POA: Diagnosis not present

## 2014-07-13 DIAGNOSIS — M5431 Sciatica, right side: Secondary | ICD-10-CM | POA: Diagnosis not present

## 2014-07-13 DIAGNOSIS — M5432 Sciatica, left side: Secondary | ICD-10-CM | POA: Diagnosis not present

## 2014-07-13 DIAGNOSIS — M9903 Segmental and somatic dysfunction of lumbar region: Secondary | ICD-10-CM | POA: Diagnosis not present

## 2014-07-23 DIAGNOSIS — M5432 Sciatica, left side: Secondary | ICD-10-CM | POA: Diagnosis not present

## 2014-07-23 DIAGNOSIS — M9903 Segmental and somatic dysfunction of lumbar region: Secondary | ICD-10-CM | POA: Diagnosis not present

## 2014-07-23 DIAGNOSIS — M5431 Sciatica, right side: Secondary | ICD-10-CM | POA: Diagnosis not present

## 2014-08-06 DIAGNOSIS — M5432 Sciatica, left side: Secondary | ICD-10-CM | POA: Diagnosis not present

## 2014-08-06 DIAGNOSIS — M9903 Segmental and somatic dysfunction of lumbar region: Secondary | ICD-10-CM | POA: Diagnosis not present

## 2014-08-06 DIAGNOSIS — M5431 Sciatica, right side: Secondary | ICD-10-CM | POA: Diagnosis not present

## 2014-08-17 ENCOUNTER — Ambulatory Visit: Payer: Medicare Other | Admitting: Diagnostic Neuroimaging

## 2014-08-25 ENCOUNTER — Ambulatory Visit (INDEPENDENT_AMBULATORY_CARE_PROVIDER_SITE_OTHER): Payer: Medicare Other | Admitting: Diagnostic Neuroimaging

## 2014-08-25 ENCOUNTER — Encounter: Payer: Self-pay | Admitting: Diagnostic Neuroimaging

## 2014-08-25 VITALS — BP 125/80 | HR 85 | Ht 63.0 in | Wt 183.4 lb

## 2014-08-25 DIAGNOSIS — G2581 Restless legs syndrome: Secondary | ICD-10-CM | POA: Diagnosis not present

## 2014-08-25 DIAGNOSIS — M5416 Radiculopathy, lumbar region: Secondary | ICD-10-CM

## 2014-08-25 MED ORDER — PRAMIPEXOLE DIHYDROCHLORIDE 1 MG PO TABS: 1.0000 mg | ORAL_TABLET | Freq: Two times a day (BID) | ORAL | Status: DC

## 2014-08-25 MED ORDER — GABAPENTIN 300 MG PO CAPS: 300.0000 mg | ORAL_CAPSULE | Freq: Three times a day (TID) | ORAL | Status: DC

## 2014-08-25 NOTE — Progress Notes (Signed)
[facility]  PATIENT: Andrea Byrd DOB: 1945-09-05  REFERRING CLINICIAN: Sikora HISTORY FROM: patient REASON FOR VISIT: follow up   HISTORICAL  CHIEF COMPLAINT:  Chief Complaint  Patient presents with  . restless leg    rm 7    HISTORY OF PRESENT ILLNESS:   UPDATE 08/25/14: Since last visit, doing well. Has had chiro tx for back issues and feels better. On gaba 300mg  TID + pramipex 1mg  qhs. Satisfied with current dosing.   UPDATE 04/17/14: Since last visit, symptoms have continued. Numbness in feet, restless legs at night. On gabapentin 300mg  TID. PCP also tried her on pramipexole 0.25mg x3 at bedtime.   UPDATE 08/13/12: 69 year old right-handed female with history of hypertension, restless leg syndrome, here for evaluation of progressive numbness and tingling in her toes and feet. I previously saw this patient in October 2013 as referred by primary care physician. Now patient is referred to me by orthopedic surgery. Since last visit patient's symptoms have progressed. Now she has numbness and tingling in her bilateral feet up to her ankles. She has intermittent sharp sensations as well. Symptoms are fairly symmetric. No symptoms in her upper legs, lower back, torso, arms or neck.  PRIOR HPI (12/18/11): 69 year old right-handed female with history of hypertension, this is a syndrome, here for evaluation of numbness in her feet for the past 3 months. Patient describes numbness and tingling in her toes and bottom of her feet. No significant pain. She also feels coldness in her feet. No back pain or symptoms radiating from her back to her feet. No symptoms in her hands or fingers. No neck pain. Symptoms are mild and gradually progressing. 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 She did not have any numbness in the symptoms at that time.  REVIEW OF SYSTEMS: Full 14 system  review of systems performed and notable only for RLS and  numbness.  ALLERGIES: Allergies  Allergen Reactions  . Nitrofurantoin     Makes mouth break out  . Propoxyphene N-Acetaminophen     REACTION: nausea  . Morphine Itching and Rash    HOME MEDICATIONS: Outpatient Prescriptions Prior to Visit  Medication Sig Dispense Refill  . aspirin 81 MG tablet Take 81 mg by mouth daily.    . Cyanocobalamin (VITAMIN B-12) 5000 MCG SUBL Place 1 tablet under the tongue daily.    . cyclobenzaprine (FLEXERIL) 10 MG tablet Take 10 mg by mouth at bedtime.     . dicyclomine (BENTYL) 10 MG capsule Take one po ac  prn IBS 30 capsule 3  . ferrous sulfate 325 (65 FE) MG tablet Take one po TID 90 tablet 3  . latanoprost (XALATAN) 0.005 % ophthalmic solution     . losartan-hydrochlorothiazide (HYZAAR) 50-12.5 MG per tablet Take 1 tablet by mouth daily.    . methocarbamol (ROBAXIN) 500 MG tablet   0  . metoprolol (LOPRESSOR) 100 MG tablet Take 50 mg by mouth daily.    Marland Kitchen omeprazole (PRILOSEC) 40 MG capsule Take 40 mg by mouth daily.    . Probiotic Product (PROBIOTIC DAILY PO) Take 1 tablet by mouth daily.    . valACYclovir (VALTREX) 1000 MG tablet Take 1,000 mg by mouth as needed.     . gabapentin (NEURONTIN) 300 MG capsule Take 1-2 capsules (300-600 mg total) by mouth 3 (three) times daily. 180 capsule 6  . pramipexole (MIRAPEX) 1 MG tablet Take 1 tablet (  1 mg total) by mouth 2 (two) times daily. 60 tablet 6  . nystatin (MYCOSTATIN) 100000 UNIT/ML suspension      No facility-administered medications prior to visit.    PAST MEDICAL HISTORY: Past Medical History  Diagnosis Date  . Esophageal stricture   . Hiatal hernia   . Gastritis   . Cervical cancer 1977  . Hypertension   . Arthritis   . Endometriosis   . GERD (gastroesophageal reflux disease)   . Diverticulosis   . IBS (irritable bowel syndrome)   . Anemia   . Restless leg syndrome   . Herpes simplex type 1 infection   . Insomnia   .  History of colon polyps   . History of gallstones   . Glaucoma     PAST SURGICAL HISTORY: Past Surgical History  Procedure Laterality Date  . Cholecystectomy  1980  . Abdominal hysterectomy  1998  . Oophorectomy    . Tubal ligation    . Cervical cone biopsy    . Colon resection  2002  . Appendectomy  1998  . Colon surgery         . Hand surgery      right    FAMILY HISTORY: Family History  Problem Relation Age of Onset  . Colon cancer Neg Hx   . Esophageal cancer Neg Hx   . Rectal cancer Neg Hx   . Stomach cancer Neg Hx   . Diabetes Mother   . Stroke Mother     brother, sister, MGM  . Colon polyps Mother   . Heart disease Father   . Kidney disease Father   . Colon polyps Father   . Kidney cancer Sister   . Colon polyps Sister   . Celiac disease Other     niece  . Diabetes Sister   . Heart attack Paternal Grandfather     SOCIAL HISTORY:  History   Social History  . Marital Status: Married    Spouse Name: N/A  . Number of Children: 1  . Years of Education: N/A   Occupational History  . Retired    Social History Main Topics  . Smoking status: Never Smoker   . Smokeless tobacco: Never Used  . Alcohol Use: No  . Drug Use: No  . Sexual Activity: Not on file   Other Topics Concern  . Not on file   Social History Narrative   Lives with husband floyd   Drinks maybe 4 sodas a week     PHYSICAL EXAM  Filed Vitals:   08/25/14 1339  BP: 125/80  Pulse: 85  Height: 5\' 3"  (1.6 m)  Weight: 183 lb 6.4 oz (83.19 kg)    Not recorded      Body mass index is 32.5 kg/(m^2).  EXAM: General: Patient is awake, alert and in no acute distress.  Well developed and groomed. Neck: Neck is supple. Cardiovascular: No carotid artery bruits.  Heart is regular rate and rhythm with no murmurs.  Neurologic Exam  Mental Status: Awake, alert. Language is fluent and comprehension intact. Cranial Nerves: Pupils are equal and reactive to light.  Visual fields are  full to confrontation.  Conjugate eye movements are full and symmetric.  Facial sensation symmetric.  MILD LEFT HEMIFACIAL SPASM (PRIOR LEFT BELL'S PALSY). Hearing is intact.  Palate elevated symmetrically and uvula is midline.  Shoulder shrug is symmetric.  Tongue is midline. Motor: Normal bulk and tone.  Full strength in the upper and lower extremities.  No  pronator drift. Sensory: Intact and symmetric to light touch; EXCEPT DECR PP AND VIB IN TOES Coordination: No ataxia or dysmetria on finger-nose or rapid alternating movement testing. Gait and Station: Narrow based gait, able to walk on heels and toes.  Tandem gait is stable. Romberg is negative. Reflexes: BUE 2, KNEES 2, ANKLES 1   DIAGNOSTIC DATA (LABS, IMAGING, TESTING) - I reviewed patient records, labs, notes, testing and imaging myself where available.  Lab Results  Component Value Date   WBC 9.0 05/22/2014   HGB 13.0 05/22/2014   HCT 38.5 05/22/2014   MCV 88.9 05/22/2014   PLT 294.0 05/22/2014      Component Value Date/Time   NA 139 01/31/2012 1154   K 3.4* 01/31/2012 1154   CL 103 01/31/2012 1154   CO2 30 01/31/2012 1154   GLUCOSE 90 01/31/2012 1154   BUN 14 01/31/2012 1154   CREATININE 0.9 01/31/2012 1154   CALCIUM 9.3 01/31/2012 1154   PROT 7.1 01/31/2012 1154   ALBUMIN 3.7 01/31/2012 1154   AST 22 01/31/2012 1154   ALT 23 01/31/2012 1154   ALKPHOS 118* 01/31/2012 1154   BILITOT 0.4 01/31/2012 1154   No results found for: CHOL No results found for: HGBA1C No results found for: VITAMINB12 No results found for: TSH  TSH 1.35  B12 826  08/27/12 NCV/EMG 1. Electrodiagnostic evidence of bilateral S1 radiculopathies. 2. Left median neuropathy at the wrist consistent with left carpal tunnel syndrome.  09/20/12 MRI lumbar spine (without) demonstrating: 1. At L5-S1: disc bulging with mild-moderate biforaminal foraminal stenosis 2. At L3-4, L4-5: disc bulging and facet hypertrophy with no spinal stenosis and mild  biforaminal narrowing    ASSESSMENT AND PLAN  69 y.o. year old female  has a past medical history of Esophageal stricture; Hiatal hernia; Gastritis; Cervical cancer (1977); Hypertension; Arthritis; Endometriosis; GERD (gastroesophageal reflux disease); Diverticulosis; IBS (irritable bowel syndrome); Anemia; Restless leg syndrome; Herpes simplex type 1 infection; Insomnia; History of colon polyps; History of gallstones; and Glaucoma. here with progressive numbness in feet, likely due to lumbar radiculopathies. Also with associated restless leg symptoms.   Dx: lumbar radiculopathies (S1) + RLS  PLAN: I spent 15 minutes of face to face time with patient. Greater than 50% of time was spent in counseling and coordination of care with patient. In summary we discussed:  1. Continue gabapentin (300 - 600mg  TID) 2. Continue pramipexole (1mg  qhs - BID) 3. Continue chiropractic treatements  Return in about 1 year (around 08/25/2015).    Penni Bombard, MD 7/59/1638, 4:66 PM Certified in Neurology, Neurophysiology and Neuroimaging  [facility] [address] [place], [place] [address] [phone]

## 2014-12-07 DIAGNOSIS — H401232 Low-tension glaucoma, bilateral, moderate stage: Secondary | ICD-10-CM | POA: Diagnosis not present

## 2014-12-18 DIAGNOSIS — Z23 Encounter for immunization: Secondary | ICD-10-CM | POA: Diagnosis not present

## 2015-01-07 DIAGNOSIS — E6609 Other obesity due to excess calories: Secondary | ICD-10-CM | POA: Diagnosis not present

## 2015-01-07 DIAGNOSIS — I10 Essential (primary) hypertension: Secondary | ICD-10-CM | POA: Diagnosis not present

## 2015-01-07 DIAGNOSIS — K219 Gastro-esophageal reflux disease without esophagitis: Secondary | ICD-10-CM | POA: Diagnosis not present

## 2015-01-07 DIAGNOSIS — G2581 Restless legs syndrome: Secondary | ICD-10-CM | POA: Diagnosis not present

## 2015-01-18 DIAGNOSIS — M5431 Sciatica, right side: Secondary | ICD-10-CM | POA: Diagnosis not present

## 2015-01-18 DIAGNOSIS — M9903 Segmental and somatic dysfunction of lumbar region: Secondary | ICD-10-CM | POA: Diagnosis not present

## 2015-01-18 DIAGNOSIS — M5432 Sciatica, left side: Secondary | ICD-10-CM | POA: Diagnosis not present

## 2015-01-21 ENCOUNTER — Other Ambulatory Visit: Payer: Self-pay

## 2015-01-21 DIAGNOSIS — Z1231 Encounter for screening mammogram for malignant neoplasm of breast: Secondary | ICD-10-CM

## 2015-02-15 DIAGNOSIS — L820 Inflamed seborrheic keratosis: Secondary | ICD-10-CM | POA: Diagnosis not present

## 2015-02-17 DIAGNOSIS — M1712 Unilateral primary osteoarthritis, left knee: Secondary | ICD-10-CM | POA: Diagnosis not present

## 2015-02-17 DIAGNOSIS — M170 Bilateral primary osteoarthritis of knee: Secondary | ICD-10-CM | POA: Diagnosis not present

## 2015-02-26 ENCOUNTER — Ambulatory Visit
Admission: RE | Admit: 2015-02-26 | Discharge: 2015-02-26 | Disposition: A | Discharge status: Home / Self Care | Payer: Medicare Other | Source: Ambulatory Visit

## 2015-02-26 DIAGNOSIS — Z1231 Encounter for screening mammogram for malignant neoplasm of breast: Secondary | ICD-10-CM

## 2015-02-26 LAB — HM MAMMOGRAPHY

## 2015-03-24 DIAGNOSIS — M1712 Unilateral primary osteoarthritis, left knee: Secondary | ICD-10-CM | POA: Diagnosis not present

## 2015-03-31 DIAGNOSIS — M1712 Unilateral primary osteoarthritis, left knee: Secondary | ICD-10-CM | POA: Diagnosis not present

## 2015-04-06 DIAGNOSIS — M1712 Unilateral primary osteoarthritis, left knee: Secondary | ICD-10-CM | POA: Diagnosis not present

## 2015-05-18 DIAGNOSIS — M1712 Unilateral primary osteoarthritis, left knee: Secondary | ICD-10-CM | POA: Diagnosis not present

## 2015-05-27 DIAGNOSIS — M1712 Unilateral primary osteoarthritis, left knee: Secondary | ICD-10-CM | POA: Diagnosis not present

## 2015-06-08 DIAGNOSIS — M1712 Unilateral primary osteoarthritis, left knee: Secondary | ICD-10-CM | POA: Diagnosis not present

## 2015-06-09 DIAGNOSIS — H401322 Pigmentary glaucoma, left eye, moderate stage: Secondary | ICD-10-CM | POA: Diagnosis not present

## 2015-06-09 DIAGNOSIS — H401312 Pigmentary glaucoma, right eye, moderate stage: Secondary | ICD-10-CM | POA: Diagnosis not present

## 2015-07-02 DIAGNOSIS — J309 Allergic rhinitis, unspecified: Secondary | ICD-10-CM | POA: Diagnosis not present

## 2015-07-05 DIAGNOSIS — M6281 Muscle weakness (generalized): Secondary | ICD-10-CM | POA: Diagnosis not present

## 2015-07-05 DIAGNOSIS — M25562 Pain in left knee: Secondary | ICD-10-CM | POA: Diagnosis not present

## 2015-07-06 DIAGNOSIS — J0100 Acute maxillary sinusitis, unspecified: Secondary | ICD-10-CM | POA: Diagnosis not present

## 2015-07-07 ENCOUNTER — Other Ambulatory Visit: Payer: Self-pay | Admitting: Diagnostic Neuroimaging

## 2015-07-20 DIAGNOSIS — M25512 Pain in left shoulder: Secondary | ICD-10-CM | POA: Diagnosis not present

## 2015-07-20 DIAGNOSIS — M7552 Bursitis of left shoulder: Secondary | ICD-10-CM | POA: Diagnosis not present

## 2015-08-05 DIAGNOSIS — K580 Irritable bowel syndrome with diarrhea: Secondary | ICD-10-CM | POA: Diagnosis not present

## 2015-08-05 DIAGNOSIS — G2581 Restless legs syndrome: Secondary | ICD-10-CM | POA: Diagnosis not present

## 2015-08-05 DIAGNOSIS — I10 Essential (primary) hypertension: Secondary | ICD-10-CM | POA: Diagnosis not present

## 2015-08-05 DIAGNOSIS — G609 Hereditary and idiopathic neuropathy, unspecified: Secondary | ICD-10-CM | POA: Diagnosis not present

## 2015-08-05 DIAGNOSIS — K219 Gastro-esophageal reflux disease without esophagitis: Secondary | ICD-10-CM | POA: Diagnosis not present

## 2015-08-05 DIAGNOSIS — R1314 Dysphagia, pharyngoesophageal phase: Secondary | ICD-10-CM | POA: Diagnosis not present

## 2015-08-15 ENCOUNTER — Ambulatory Visit: Payer: Self-pay | Admitting: Orthopedic Surgery

## 2015-08-19 DIAGNOSIS — M1712 Unilateral primary osteoarthritis, left knee: Secondary | ICD-10-CM | POA: Diagnosis not present

## 2015-08-24 DIAGNOSIS — M6281 Muscle weakness (generalized): Secondary | ICD-10-CM | POA: Diagnosis not present

## 2015-08-24 DIAGNOSIS — M25562 Pain in left knee: Secondary | ICD-10-CM | POA: Diagnosis not present

## 2015-08-24 DIAGNOSIS — S93492A Sprain of other ligament of left ankle, initial encounter: Secondary | ICD-10-CM | POA: Diagnosis not present

## 2015-08-25 ENCOUNTER — Ambulatory Visit: Payer: Medicare Other | Admitting: Diagnostic Neuroimaging

## 2015-08-26 ENCOUNTER — Encounter: Payer: Self-pay | Admitting: Diagnostic Neuroimaging

## 2015-08-31 DIAGNOSIS — M25562 Pain in left knee: Secondary | ICD-10-CM | POA: Diagnosis not present

## 2015-08-31 DIAGNOSIS — M6281 Muscle weakness (generalized): Secondary | ICD-10-CM | POA: Diagnosis not present

## 2015-09-01 ENCOUNTER — Encounter: Payer: Self-pay | Admitting: Diagnostic Neuroimaging

## 2015-09-01 ENCOUNTER — Ambulatory Visit (INDEPENDENT_AMBULATORY_CARE_PROVIDER_SITE_OTHER): Payer: Medicare Other | Admitting: Diagnostic Neuroimaging

## 2015-09-01 VITALS — BP 119/76 | HR 86 | Ht 63.0 in | Wt 191.6 lb

## 2015-09-01 DIAGNOSIS — M5416 Radiculopathy, lumbar region: Secondary | ICD-10-CM

## 2015-09-01 DIAGNOSIS — G2581 Restless legs syndrome: Secondary | ICD-10-CM

## 2015-09-01 MED ORDER — GABAPENTIN 300 MG PO CAPS: 600.0000 mg | ORAL_CAPSULE | Freq: Two times a day (BID) | ORAL | Status: DC

## 2015-09-01 MED ORDER — PRAMIPEXOLE DIHYDROCHLORIDE 1 MG PO TABS: 1.0000 mg | ORAL_TABLET | Freq: Two times a day (BID) | ORAL | Status: DC

## 2015-09-01 NOTE — Progress Notes (Signed)
[facility]  PATIENT: Andrea Byrd DOB: 05-08-1945  REFERRING CLINICIAN: Sikora HISTORY FROM: patient REASON FOR VISIT: follow up   HISTORICAL  CHIEF COMPLAINT:  Chief Complaint  Patient presents with  . Restless leg syndrome    rm 6, "as long as I take my medicine I am fine w/my legs"  . Follow-up    1 year    HISTORY OF PRESENT ILLNESS:   UPDATE 09/01/15: Since last visit, doing well. On gabapentin 600mg  BID + pramipexole 1mg  BID. Sxs well controlled. No side effects.   UPDATE 08/25/14: Since last visit, doing well. Has had chiro tx for back issues and feels better. On gabapentin 300mg  TID + pramipex 1mg  qhs. Satisfied with current dosing.   UPDATE 04/17/14: Since last visit, symptoms have continued. Numbness in feet, restless legs at night. On gabapentin 300mg  TID. PCP also tried her on pramipexole 0.25mg x3 at bedtime.   UPDATE 08/13/12: 70 year old right-handed female with history of hypertension, restless leg syndrome, here for evaluation of progressive numbness and tingling in her toes and feet. I previously saw this patient in October 2013 as referred by primary care physician. Now patient is referred to me by orthopedic surgery. Since last visit patient's symptoms have progressed. Now she has numbness and tingling in her bilateral feet up to her ankles. She has intermittent sharp sensations as well. Symptoms are fairly symmetric. No symptoms in her upper legs, lower back, torso, arms or neck.  PRIOR HPI (12/18/11): 70 year old right-handed female with history of hypertension, this is a syndrome, here for evaluation of numbness in her feet for the past 3 months. Patient describes numbness and tingling in her toes and bottom of her feet. No significant pain. She also feels coldness in her feet. No back pain or symptoms radiating from her back to her feet. No symptoms in her hands or fingers. No neck pain. Symptoms are mild and gradually progressing. 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 She did not have any numbness in the symptoms at that time.  REVIEW OF SYSTEMS: Full 14 system review of systems performed and notable only for RLS and joint pain.   ALLERGIES: Allergies  Allergen Reactions  . Nitrofurantoin     Makes mouth break out  . Propoxyphene N-Acetaminophen     REACTION: nausea  . Hydrocodone-Homatropine Itching and Rash  . Morphine Itching and Rash    HOME MEDICATIONS: Outpatient Prescriptions Prior to Visit  Medication Sig Dispense Refill  . aspirin 81 MG tablet Take 81 mg by mouth daily.    . cyclobenzaprine (FLEXERIL) 10 MG tablet Take 10 mg by mouth at bedtime.     . dicyclomine (BENTYL) 10 MG capsule Take one po ac  prn IBS 30 capsule 3  . ferrous sulfate 325 (65 FE) MG tablet Take one po TID 90 tablet 3  . gabapentin (NEURONTIN) 300 MG capsule Take 1-2 capsules (300-600 mg total) by mouth 3 (three) times daily. 180 capsule 6  . latanoprost (XALATAN) 0.005 % ophthalmic solution     . losartan-hydrochlorothiazide (HYZAAR) 50-12.5 MG per tablet Take 1 tablet by mouth daily.    . metoprolol (LOPRESSOR) 100 MG tablet Take 50 mg by mouth daily.    Marland Kitchen omeprazole (PRILOSEC) 40 MG capsule Take 40 mg by mouth daily.    . Probiotic Product (PROBIOTIC DAILY PO) Take 1 tablet by mouth daily.    . valACYclovir (  VALTREX) 1000 MG tablet Take 1,000 mg by mouth as needed.     . Cyanocobalamin (VITAMIN B-12) 5000 MCG SUBL Place 1 tablet under the tongue daily.    . methocarbamol (ROBAXIN) 500 MG tablet   0  . pramipexole (MIRAPEX) 1 MG tablet TAKE 1 TABLET (1 MG TOTAL) BY MOUTH 2 (TWO) TIMES DAILY. 90 tablet 6   No facility-administered medications prior to visit.    PAST MEDICAL HISTORY: Past Medical History  Diagnosis Date  . Esophageal stricture   . Hiatal hernia   . Gastritis   . Cervical cancer ([place]) 1977  . Hypertension   .  Arthritis   . Endometriosis   . GERD (gastroesophageal reflux disease)   . Diverticulosis   . IBS (irritable bowel syndrome)   . Anemia   . Restless leg syndrome   . Herpes simplex type 1 infection   . Insomnia   . History of colon polyps   . History of gallstones   . Glaucoma     PAST SURGICAL HISTORY: Past Surgical History  Procedure Laterality Date  . Cholecystectomy  1980  . Abdominal hysterectomy  1998  . Oophorectomy    . Tubal ligation    . Cervical cone biopsy    . Colon resection  2002  . Appendectomy  1998  . Colon surgery         . Hand surgery      right    FAMILY HISTORY: Family History  Problem Relation Age of Onset  . Colon cancer Neg Hx   . Esophageal cancer Neg Hx   . Rectal cancer Neg Hx   . Stomach cancer Neg Hx   . Diabetes Mother   . Stroke Mother     brother, sister, MGM  . Colon polyps Mother   . Heart disease Father   . Kidney disease Father   . Colon polyps Father   . Kidney cancer Sister   . Colon polyps Sister   . Celiac disease Other     niece  . Diabetes Sister   . Heart attack Paternal Grandfather     SOCIAL HISTORY:  Social History   Social History  . Marital Status: Married    Spouse Name: N/A  . Number of Children: 1  . Years of Education: N/A   Occupational History  . Retired    Social History Main Topics  . Smoking status: Never Smoker   . Smokeless tobacco: Never Used  . Alcohol Use: No  . Drug Use: No  . Sexual Activity: Not on file   Other Topics Concern  . Not on file   Social History Narrative   Lives with husband floyd   Drinks maybe 4 sodas a week     PHYSICAL EXAM  Filed Vitals:   09/01/15 1405  BP: 119/76  Pulse: 86  Height: 5\' 3"  (1.6 m)  Weight: 191 lb 9.6 oz (86.909 kg)    Not recorded      Body mass index is 33.95 kg/(m^2).  EXAM: General: Patient is awake, alert and in no acute distress.  Well developed and groomed. Neck: Neck is supple. Cardiovascular: No carotid artery  bruits.  Heart is regular rate and rhythm with no murmurs.  Neurologic Exam  Mental Status: Awake, alert. Language is fluent and comprehension intact. Cranial Nerves: Pupils are equal and reactive to light.  Visual fields are full to confrontation.  Conjugate eye movements are full and symmetric.  Facial sensation symmetric.  MILD  LEFT HEMIFACIAL SPASM (PRIOR LEFT BELL'S PALSY). Hearing is intact.  Palate elevated symmetrically and uvula is midline.  Shoulder shrug is symmetric.  Tongue is midline. Motor: Normal bulk and tone.  Full strength in the upper and lower extremities. EXCEPT LIMITED IN LEFT FOOT DF DUE TO PAIN AND ANKLE BRACE. No pronator drift. Sensory: Intact and symmetric to light touch; EXCEPT DECR PP AND VIB IN TOES Coordination: No ataxia or dysmetria on finger-nose or rapid alternating movement testing. Reflexes: BUE 2, KNEES 2, ANKLES 1 Gait and Station: Narrow based gait   DIAGNOSTIC DATA (LABS, IMAGING, TESTING) - I reviewed patient records, labs, notes, testing and imaging myself where available.  Lab Results  Component Value Date   WBC 9.0 05/22/2014   HGB 13.0 05/22/2014   HCT 38.5 05/22/2014   MCV 88.9 05/22/2014   PLT 294.0 05/22/2014      Component Value Date/Time   NA 139 01/31/2012 1154   K 3.4* 01/31/2012 1154   CL 103 01/31/2012 1154   CO2 30 01/31/2012 1154   GLUCOSE 90 01/31/2012 1154   BUN 14 01/31/2012 1154   CREATININE 0.9 01/31/2012 1154   CALCIUM 9.3 01/31/2012 1154   PROT 7.1 01/31/2012 1154   ALBUMIN 3.7 01/31/2012 1154   AST 22 01/31/2012 1154   ALT 23 01/31/2012 1154   ALKPHOS 118* 01/31/2012 1154   BILITOT 0.4 01/31/2012 1154   No results found for: CHOL No results found for: HGBA1C No results found for: VITAMINB12 No results found for: TSH  TSH 1.35  B12 826  08/27/12 NCV/EMG 1. Electrodiagnostic evidence of bilateral S1 radiculopathies. 2. Left median neuropathy at the wrist consistent with left carpal tunnel  syndrome.  09/20/12 MRI lumbar spine (without) demonstrating: 1. At L5-S1: disc bulging with mild-moderate biforaminal foraminal stenosis 2. At L3-4, L4-5: disc bulging and facet hypertrophy with no spinal stenosis and mild biforaminal narrowing    ASSESSMENT AND PLAN  70 y.o. year old female  has a past medical history of Esophageal stricture; Hiatal hernia; Gastritis; Cervical cancer ([place]) (1977); Hypertension; Arthritis; Endometriosis; GERD (gastroesophageal reflux disease); Diverticulosis; IBS (irritable bowel syndrome); Anemia; Restless leg syndrome; Herpes simplex type 1 infection; Insomnia; History of colon polyps; History of gallstones; and Glaucoma. here with progressive numbness in feet, likely due to lumbar radiculopathies. Also with associated restless leg symptoms. Doing well on gabapentin and pramipexole.   Dx: lumbar radiculopathies (S1) + RLS  Restless legs syndrome (RLS)  Lumbar radiculopathy    PLAN: 1. Continue gabapentin (600mg  BID) 2. Continue pramipexole (1mg  BID) 3. Continue chiropractic treatments and PT  Meds ordered this encounter  Medications  . gabapentin (NEURONTIN) 300 MG capsule    Sig: Take 2 capsules (600 mg total) by mouth 2 (two) times daily.    Dispense:  360 capsule    Refill:  4  . pramipexole (MIRAPEX) 1 MG tablet    Sig: Take 1 tablet (1 mg total) by mouth 2 (two) times daily.    Dispense:  180 tablet    Refill:  4   Return in about 1 year (around 08/31/2016).     Penni Bombard, MD [phone], [id number] PM Certified in Neurology, Neurophysiology and Neuroimaging  [facility] [address], [place], [place] [address] [phone]

## 2015-09-01 NOTE — Patient Instructions (Signed)
-   continue gabapentin 600mg  twice a day.  - continue pramipexole 1mg  twice a day.

## 2015-09-02 DIAGNOSIS — M6281 Muscle weakness (generalized): Secondary | ICD-10-CM | POA: Diagnosis not present

## 2015-09-02 DIAGNOSIS — M25562 Pain in left knee: Secondary | ICD-10-CM | POA: Diagnosis not present

## 2015-09-06 DIAGNOSIS — M6281 Muscle weakness (generalized): Secondary | ICD-10-CM | POA: Diagnosis not present

## 2015-09-06 DIAGNOSIS — M25562 Pain in left knee: Secondary | ICD-10-CM | POA: Diagnosis not present

## 2015-09-09 DIAGNOSIS — M25562 Pain in left knee: Secondary | ICD-10-CM | POA: Diagnosis not present

## 2015-09-09 DIAGNOSIS — M6281 Muscle weakness (generalized): Secondary | ICD-10-CM | POA: Diagnosis not present

## 2015-09-27 DIAGNOSIS — M6281 Muscle weakness (generalized): Secondary | ICD-10-CM | POA: Diagnosis not present

## 2015-09-27 DIAGNOSIS — M25562 Pain in left knee: Secondary | ICD-10-CM | POA: Diagnosis not present

## 2015-10-04 ENCOUNTER — Inpatient Hospital Stay (HOSPITAL_COMMUNITY): Admit: 2015-10-04 | Payer: Medicare Other | Admitting: Orthopedic Surgery

## 2015-10-04 ENCOUNTER — Encounter (HOSPITAL_COMMUNITY): Payer: Self-pay

## 2015-10-04 DIAGNOSIS — M6281 Muscle weakness (generalized): Secondary | ICD-10-CM | POA: Diagnosis not present

## 2015-10-04 DIAGNOSIS — M25562 Pain in left knee: Secondary | ICD-10-CM | POA: Diagnosis not present

## 2015-10-04 SURGERY — ARTHROPLASTY, KNEE, TOTAL
Anesthesia: Choice | Site: Knee | Laterality: Left

## 2015-10-11 DIAGNOSIS — K219 Gastro-esophageal reflux disease without esophagitis: Secondary | ICD-10-CM | POA: Diagnosis not present

## 2015-10-11 DIAGNOSIS — I10 Essential (primary) hypertension: Secondary | ICD-10-CM | POA: Diagnosis not present

## 2015-10-11 LAB — HEPATIC FUNCTION PANEL
ALK PHOS: 114 U/L (ref 25–125)
ALT: 31 U/L (ref 7–35)
AST: 25 U/L (ref 13–35)

## 2015-10-11 LAB — CBC AND DIFFERENTIAL
HEMATOCRIT: 37 % (ref 36–46)
HEMOGLOBIN: 12.6 g/dL (ref 12.0–16.0)
NEUTROS ABS: 4 /uL
Platelets: 243 10*3/uL (ref 150–399)
WBC: 6.8 10^3/mL

## 2015-10-11 LAB — BASIC METABOLIC PANEL
BUN: 13 mg/dL (ref 4–21)
CREATININE: 0.9 mg/dL (ref <=1.1)
Glucose: 91 mg/dL
Potassium: 4.2 mmol/L (ref 3.4–5.3)
SODIUM: 141 mmol/L (ref 137–147)

## 2015-10-15 DIAGNOSIS — S93402A Sprain of unspecified ligament of left ankle, initial encounter: Secondary | ICD-10-CM | POA: Diagnosis not present

## 2015-10-15 DIAGNOSIS — M24272 Disorder of ligament, left ankle: Secondary | ICD-10-CM | POA: Diagnosis not present

## 2015-10-15 DIAGNOSIS — M6281 Muscle weakness (generalized): Secondary | ICD-10-CM | POA: Diagnosis not present

## 2015-10-15 DIAGNOSIS — M25572 Pain in left ankle and joints of left foot: Secondary | ICD-10-CM | POA: Diagnosis not present

## 2015-10-22 ENCOUNTER — Encounter: Payer: Self-pay | Admitting: General Practice

## 2015-10-25 DIAGNOSIS — M25562 Pain in left knee: Secondary | ICD-10-CM | POA: Diagnosis not present

## 2015-10-25 DIAGNOSIS — M6281 Muscle weakness (generalized): Secondary | ICD-10-CM | POA: Diagnosis not present

## 2015-11-02 DIAGNOSIS — M25562 Pain in left knee: Secondary | ICD-10-CM | POA: Diagnosis not present

## 2015-11-02 DIAGNOSIS — M6281 Muscle weakness (generalized): Secondary | ICD-10-CM | POA: Diagnosis not present

## 2015-11-03 DIAGNOSIS — Z23 Encounter for immunization: Secondary | ICD-10-CM | POA: Diagnosis not present

## 2015-11-04 DIAGNOSIS — M25562 Pain in left knee: Secondary | ICD-10-CM | POA: Diagnosis not present

## 2015-11-04 DIAGNOSIS — M6281 Muscle weakness (generalized): Secondary | ICD-10-CM | POA: Diagnosis not present

## 2015-11-05 DIAGNOSIS — S93492D Sprain of other ligament of left ankle, subsequent encounter: Secondary | ICD-10-CM | POA: Diagnosis not present

## 2015-11-05 DIAGNOSIS — M25572 Pain in left ankle and joints of left foot: Secondary | ICD-10-CM | POA: Diagnosis not present

## 2015-11-05 DIAGNOSIS — M6281 Muscle weakness (generalized): Secondary | ICD-10-CM | POA: Diagnosis not present

## 2015-11-05 DIAGNOSIS — M24272 Disorder of ligament, left ankle: Secondary | ICD-10-CM | POA: Diagnosis not present

## 2015-11-16 DIAGNOSIS — S93402D Sprain of unspecified ligament of left ankle, subsequent encounter: Secondary | ICD-10-CM | POA: Diagnosis not present

## 2015-11-22 DIAGNOSIS — M25872 Other specified joint disorders, left ankle and foot: Secondary | ICD-10-CM | POA: Diagnosis not present

## 2015-11-22 DIAGNOSIS — S93492D Sprain of other ligament of left ankle, subsequent encounter: Secondary | ICD-10-CM | POA: Diagnosis not present

## 2015-11-22 DIAGNOSIS — M24272 Disorder of ligament, left ankle: Secondary | ICD-10-CM | POA: Diagnosis not present

## 2015-11-22 DIAGNOSIS — M25572 Pain in left ankle and joints of left foot: Secondary | ICD-10-CM | POA: Diagnosis not present

## 2015-11-29 DIAGNOSIS — M6281 Muscle weakness (generalized): Secondary | ICD-10-CM | POA: Diagnosis not present

## 2015-12-02 DIAGNOSIS — M6281 Muscle weakness (generalized): Secondary | ICD-10-CM | POA: Diagnosis not present

## 2015-12-04 DIAGNOSIS — J018 Other acute sinusitis: Secondary | ICD-10-CM | POA: Diagnosis not present

## 2015-12-04 DIAGNOSIS — R05 Cough: Secondary | ICD-10-CM | POA: Diagnosis not present

## 2015-12-13 DIAGNOSIS — M6281 Muscle weakness (generalized): Secondary | ICD-10-CM | POA: Diagnosis not present

## 2015-12-15 DIAGNOSIS — H401312 Pigmentary glaucoma, right eye, moderate stage: Secondary | ICD-10-CM | POA: Diagnosis not present

## 2015-12-16 DIAGNOSIS — M6281 Muscle weakness (generalized): Secondary | ICD-10-CM | POA: Diagnosis not present

## 2015-12-21 DIAGNOSIS — S93492D Sprain of other ligament of left ankle, subsequent encounter: Secondary | ICD-10-CM | POA: Diagnosis not present

## 2015-12-21 DIAGNOSIS — M25572 Pain in left ankle and joints of left foot: Secondary | ICD-10-CM | POA: Diagnosis not present

## 2015-12-21 DIAGNOSIS — M25872 Other specified joint disorders, left ankle and foot: Secondary | ICD-10-CM | POA: Diagnosis not present

## 2015-12-21 DIAGNOSIS — M24272 Disorder of ligament, left ankle: Secondary | ICD-10-CM | POA: Diagnosis not present

## 2016-01-10 ENCOUNTER — Other Ambulatory Visit: Payer: Self-pay | Admitting: Family Medicine

## 2016-01-10 DIAGNOSIS — Z1231 Encounter for screening mammogram for malignant neoplasm of breast: Secondary | ICD-10-CM

## 2016-01-14 DIAGNOSIS — M1712 Unilateral primary osteoarthritis, left knee: Secondary | ICD-10-CM | POA: Diagnosis not present

## 2016-01-17 ENCOUNTER — Encounter: Payer: Self-pay | Admitting: Gastroenterology

## 2016-01-17 ENCOUNTER — Encounter (INDEPENDENT_AMBULATORY_CARE_PROVIDER_SITE_OTHER): Payer: Self-pay

## 2016-01-17 ENCOUNTER — Ambulatory Visit (INDEPENDENT_AMBULATORY_CARE_PROVIDER_SITE_OTHER): Payer: Medicare Other | Admitting: Gastroenterology

## 2016-01-17 VITALS — BP 138/80 | HR 84 | Ht 62.25 in | Wt 194.0 lb

## 2016-01-17 DIAGNOSIS — R1310 Dysphagia, unspecified: Secondary | ICD-10-CM

## 2016-01-17 NOTE — Progress Notes (Signed)
Andrea Byrd    [id number]    [birth year]  Primary Care Physician:Andrea Birdie Riddle, MD  Referring Physician: Stephens Shire, MD [address] [place], [place] [address] . Chief complaint: Dysphagia  HPI: 70 year old white female previously followed by Dr. Olevia Byrd for  irritable bowel syndrome and symptomatic diverticulosis here with complaints of worsening dysphagia . Upper endoscopy in December 2013 show small hiatal hernia with stricture which was dilated with 14,15 and 16 mm dilators. She is having difficulty with both solids and liquids but is worse with bread and meat and has had 3 or 4 episodes in the past week. Currently on omeprazole with good control of heartburn. She also has history of irritable bowel syndrome with predominant diarrhea and her symptoms are currently well-controlled.  Last colonoscopy in December 2013 showed widely patent sigmoid anastomosis at 20 cm. She had mild diverticulosis and questionable AVM in the right colon. Prior colonoscopies were in 1987, 2000 and 2005. She was evaluated for iron deficiency anemia in December 2013 and has been on iron supplements. No definite cause of bleeding was found.  Denies any nausea, vomiting, abdominal pain, melena or bright red blood per rectum    Outpatient Encounter Prescriptions as of 01/17/2016  Medication Sig  . aspirin 81 MG tablet Take 81 mg by mouth daily.  . cyclobenzaprine (FLEXERIL) 10 MG tablet Take 10 mg by mouth at bedtime.   . dicyclomine (BENTYL) 10 MG capsule Take one po ac  prn IBS (Patient taking differently: Take 10 mg by mouth 2 (two) times daily before a meal. Take one po ac  prn IBS)  . Docusate Sodium (STOOL SOFTENER) 100 MG capsule Take 100 mg by mouth at bedtime.  . ferrous sulfate 325 (65 FE) MG tablet Take one po TID  . gabapentin (NEURONTIN) 300 MG capsule Take 2 capsules (600 mg total) by mouth 2 (two) times daily.  Marland Kitchen latanoprost (XALATAN) 0.005 % ophthalmic solution  Place 1 drop into both eyes at bedtime.   Marland Kitchen losartan-hydrochlorothiazide (HYZAAR) 50-12.5 MG per tablet Take 1 tablet by mouth daily.  . metoprolol (LOPRESSOR) 100 MG tablet Take 50 mg by mouth daily.  . naproxen sodium (ANAPROX) 220 MG tablet Take 2 mg by mouth at bedtime.  Marland Kitchen omeprazole (PRILOSEC) 40 MG capsule Take 40 mg by mouth daily.  . pramipexole (MIRAPEX) 1 MG tablet Take 1 tablet (1 mg total) by mouth 2 (two) times daily.  . TURMERIC PO Take 1 capsule by mouth 2 (two) times daily. With [employer]  . valACYclovir (VALTREX) 1000 MG tablet Take 1,000 mg by mouth as needed.   . [DISCONTINUED] Probiotic Product (PROBIOTIC DAILY PO) Take 1 tablet by mouth daily.   No facility-administered encounter medications on file as of 01/17/2016.     Allergies as of 01/17/2016 - Review Complete 09/01/2015  Allergen Reaction Noted  . Nitrofurantoin  02/04/2009  . Propoxyphene n-acetaminophen  02/04/2009  . Hydrocodone-homatropine Itching and Rash 09/01/2015  . Morphine Itching and Rash     Past Medical History:  Diagnosis Date  . Anemia   . Arthritis   . Cervical cancer ([place]) 1977  . Diverticulosis   . Endometriosis   . Esophageal stricture   . Gastritis   . GERD (gastroesophageal reflux disease)   . Glaucoma   . Herpes simplex type 1 infection   . Hiatal hernia   . History of colon polyps   . History  of gallstones   . Hypertension   . IBS (irritable bowel syndrome)   . Insomnia   . Restless leg syndrome     Past Surgical History:  Procedure Laterality Date  . ABDOMINAL HYSTERECTOMY  1998  . APPENDECTOMY  1998  . CERVICAL CONE BIOPSY    . CHOLECYSTECTOMY  1980  . COLON RESECTION  2002  . COLON SURGERY        . HAND SURGERY     right  . OOPHORECTOMY    . TUBAL LIGATION      Family History  Problem Relation Age of Onset  . Diabetes Mother   . Stroke Mother     brother, sister, MGM  . Colon polyps Mother   . Heart disease Father   . Kidney disease Father   . Colon  polyps Father   . Kidney cancer Sister   . Colon polyps Sister   . Celiac disease Other     niece  . Diabetes Sister   . Heart attack Paternal Grandfather   . Colon cancer Neg Hx   . Esophageal cancer Neg Hx   . Rectal cancer Neg Hx   . Stomach cancer Neg Hx     Social History   Social History  . Marital status: Married    Spouse name: N/A  . Number of children: 1  . Years of education: N/A   Occupational History  . Retired    Social History Main Topics  . Smoking status: Never Smoker  . Smokeless tobacco: Never Used  . Alcohol use No  . Drug use: No  . Sexual activity: Not on file   Other Topics Concern  . Not on file   Social History Narrative   Lives with husband Andrea Byrd   Drinks maybe 4 sodas a week      Review of systems: Review of Systems  Constitutional: Negative for fever and chills.  HENT: Negative.   Eyes: Negative for blurred vision.  Respiratory: Negative for cough, shortness of breath and wheezing.   Cardiovascular: Negative for chest pain and palpitations.  Gastrointestinal: as per HPI Genitourinary: Negative for dysuria, urgency, frequency and hematuria.  Musculoskeletal: Negative for myalgias, back pain and positive for joint pain.  Skin: Negative for itching and rash.  Neurological: Negative for dizziness, tremors, focal weakness, seizures and loss of consciousness.  Endo/Heme/Allergies: Positive for seasonal allergies.  Psychiatric/Behavioral: Negative for depression, suicidal ideas and hallucinations.  All other systems reviewed and are negative.   Physical Exam: Vitals:   01/17/16 1337  BP: 138/80  Pulse: 84   Body mass index is 35.2 kg/m. Gen:      No acute distress HEENT:  EOMI, sclera anicteric Neck:     No masses; no thyromegaly Lungs:    Clear to auscultation bilaterally; normal respiratory effort CV:         Regular rate and rhythm; no murmurs Abd:      + bowel sounds; soft, non-tender; no palpable masses, no  distension Ext:    No edema; adequate peripheral perfusion Skin:      Warm and dry; no rash Neuro: alert and oriented x 3 Psych: normal mood and affect  Data Reviewed:  Reviewed labs, radiology imaging, old records and pertinent past GI work up   Assessment and Plan/Recommendations:  70 year old female with history of chronic GERD and esophageal stricture status post dilation to 16 mm in December 2013, IBS predominant diarrhea is here for follow-up with complaints of worsening solid dysphagia  in the past few weeks We'll proceed with EGD for evaluation and possible dilation The risks and benefits as well as alternatives of endoscopic procedure(s) have been discussed and reviewed. All questions answered. The patient agrees to proceed. Continue PPI and antireflux measures Advise patient to avoid meat, bread and raw vegetables until the procedure to prevent food impactions Greater than 50% of the time used for counseling as well as treatment plan and follow-up. She had multiple questions which were answered to her satisfaction  K. Denzil Magnuson , MD [phone] Mon-Fri 8a-5p [phone] after 5p, weekends, holidays  CC: Andrea Shire, MD

## 2016-01-17 NOTE — Patient Instructions (Signed)

## 2016-01-18 ENCOUNTER — Encounter: Payer: Medicare Other | Admitting: Gastroenterology

## 2016-02-01 ENCOUNTER — Encounter: Payer: Medicare Other | Admitting: Gastroenterology

## 2016-02-09 ENCOUNTER — Encounter: Payer: Self-pay | Admitting: Gastroenterology

## 2016-02-09 ENCOUNTER — Ambulatory Visit (AMBULATORY_SURGERY_CENTER): Payer: Medicare Other | Admitting: Gastroenterology

## 2016-02-09 VITALS — BP 120/72 | HR 69 | Temp 96.2°F | Resp 13 | Ht 62.0 in | Wt 194.0 lb

## 2016-02-09 DIAGNOSIS — K449 Diaphragmatic hernia without obstruction or gangrene: Secondary | ICD-10-CM | POA: Diagnosis not present

## 2016-02-09 DIAGNOSIS — K2210 Ulcer of esophagus without bleeding: Secondary | ICD-10-CM | POA: Diagnosis not present

## 2016-02-09 DIAGNOSIS — K222 Esophageal obstruction: Secondary | ICD-10-CM | POA: Diagnosis not present

## 2016-02-09 DIAGNOSIS — K2990 Gastroduodenitis, unspecified, without bleeding: Secondary | ICD-10-CM

## 2016-02-09 DIAGNOSIS — R1310 Dysphagia, unspecified: Secondary | ICD-10-CM

## 2016-02-09 DIAGNOSIS — K2950 Unspecified chronic gastritis without bleeding: Secondary | ICD-10-CM | POA: Diagnosis not present

## 2016-02-09 DIAGNOSIS — K589 Irritable bowel syndrome without diarrhea: Secondary | ICD-10-CM | POA: Diagnosis not present

## 2016-02-09 DIAGNOSIS — K2970 Gastritis, unspecified, without bleeding: Secondary | ICD-10-CM | POA: Diagnosis not present

## 2016-02-09 DIAGNOSIS — I10 Essential (primary) hypertension: Secondary | ICD-10-CM | POA: Diagnosis not present

## 2016-02-09 MED ORDER — SODIUM CHLORIDE 0.9 % IV SOLN: 500.0000 mL | INTRAVENOUS | Status: DC

## 2016-02-09 NOTE — Progress Notes (Signed)
Called to room to assist during endoscopic procedure.  Patient ID and intended procedure confirmed with present staff. Received instructions for my participation in the procedure from the performing physician.  

## 2016-02-09 NOTE — Progress Notes (Signed)
To PACU, vss patent aw report to rn 

## 2016-02-09 NOTE — Patient Instructions (Signed)
YOU HAD AN ENDOSCOPIC PROCEDURE TODAY AT [facility]:   Refer to the procedure report that was given to you for any specific questions about what was found during the examination.  If the procedure report does not answer your questions, please call your gastroenterologist to clarify.  If you requested that your care partner not be given the details of your procedure findings, then the procedure report has been included in a sealed envelope for you to review at your convenience later.  YOU SHOULD EXPECT: Some feelings of bloating in the abdomen. Passage of more gas than usual.  Walking can help get rid of the air that was put into your GI tract during the procedure and reduce the bloating. If you had a lower endoscopy (such as a colonoscopy or flexible sigmoidoscopy) you may notice spotting of blood in your stool or on the toilet paper. If you underwent a bowel prep for your procedure, you may not have a normal bowel movement for a few days.  Please Note:  You might notice some irritation and congestion in your nose or some drainage.  This is from the oxygen used during your procedure.  There is no need for concern and it should clear up in a day or so.  SYMPTOMS TO REPORT IMMEDIATELY:   Following lower endoscopy (colonoscopy or flexible sigmoidoscopy):  Excessive amounts of blood in the stool  Significant tenderness or worsening of abdominal pains  Swelling of the abdomen that is new, acute  Fever of 100F or higher   Following upper endoscopy (EGD)  Vomiting of blood or coffee ground material  New chest pain or pain under the shoulder blades  Painful or persistently difficult swallowing  New shortness of breath  Fever of 100F or higher  Black, tarry-looking stools  For urgent or emergent issues, a gastroenterologist can be reached at any hour by calling [phone].   DIET:  We do recommend a small meal at first, but then you may proceed to your regular diet.  Drink  plenty of fluids but you should avoid alcoholic beverages for 24 hours.  Please read all handout given to you by your recovery nurse.  ACTIVITY:  You should plan to take it easy for the rest of today and you should NOT DRIVE or use heavy machinery until tomorrow (because of the sedation medicines used during the test).    FOLLOW UP: Our staff will call the number listed on your records the next business day following your procedure to check on you and address any questions or concerns that you may have regarding the information given to you following your procedure. If we do not reach you, we will leave a message.  However, if you are feeling well and you are not experiencing any problems, there is no need to return our call.  We will assume that you have returned to your regular daily activities without incident.  If any biopsies were taken you will be contacted by phone or by letter within the next 1-3 weeks.  Please call us at [phone] if you have not heard about the biopsies in 3 weeks.    SIGNATURES/CONFIDENTIALITY: You and/or your care partner have signed paperwork which will be entered into your electronic medical record.  These signatures attest to the fact that that the information above on your After Visit Summary has been reviewed and is understood.  Full responsibility of the confidentiality of this discharge information lies with you and/or your care-partner.  Thank you for letting us take care of your healthcare needs today.

## 2016-02-09 NOTE — Op Note (Signed)
[facility] Patient Name: Andrea Byrd Procedure Date: 02/09/2016 2:11 PM MRN: [id number] Endoscopist: Mauri Pole , MD Age: 70 Referring MD:  Date of Birth: 05/14/1945 Gender: Female Account #: [id number] Procedure:                Upper GI endoscopy Indications:              Dysphagia Medicines:                Monitored Anesthesia Care Procedure:                Pre-Anesthesia Assessment:                           - Prior to the procedure, a History and Physical                            was performed, and patient medications and                            allergies were reviewed. The patient's tolerance of                            previous anesthesia was also reviewed. The risks                            and benefits of the procedure and the sedation                            options and risks were discussed with the patient.                            All questions were answered, and informed consent                            was obtained. Prior Anticoagulants: The patient has                            taken no previous anticoagulant or antiplatelet                            agents. ASA Grade Assessment: II - A patient with                            mild systemic disease. After reviewing the risks                            and benefits, the patient was deemed in                            satisfactory condition to undergo the procedure.                           After obtaining informed consent, the endoscope was  passed under direct vision. Throughout the                            procedure, the patient's blood pressure, pulse, and                            oxygen saturations were monitored continuously. The                            Model GIF-HQ190 [id number]) scope was introduced                            through the mouth, and advanced to the second part                            of duodenum. The upper GI endoscopy was                           accomplished without difficulty. The patient                            tolerated the procedure well. Scope In: Scope Out: Findings:                 One mild (non-circumferential scarring)                            benign-appearing, intrinsic stenosis was found 34                            to 35 cm from the incisors. This measured 2 cm                            (inner diameter) x 1 cm (in length) and was                            traversed.                           LA Grade C (one or more mucosal breaks continuous                            between tops of 2 or more mucosal folds, less than                            75% circumference) esophagitis with no bleeding was                            found 33 to 35 cm from the incisors. Biopsies were                            taken with a cold forceps for histology.  A small hiatal hernia was present. Noted bile                            staining of entire gastric mucosa                           Patchy moderate inflammation with hemorrhage                            characterized by congestion (edema), erosions and                            erythema was found in the entire examined stomach.                            Biopsies were taken with a cold forceps for                            histology and H.pylori                           The examined duodenum was normal. Complications:            No immediate complications. Estimated Blood Loss:     Estimated blood loss was minimal. Impression:               - Benign-appearing esophageal stenosis.                           - LA Grade C erosive esophagitis. Biopsied.                           - Gastritis with hemorrhage. Biopsied.                           - Normal examined duodenum. Recommendation:           - Patient has a contact number available for                            emergencies. The signs and symptoms of potential                             delayed complications were discussed with the                            patient. Return to normal activities tomorrow.                            Written discharge instructions were provided to the                            patient.                           - Resume previous diet.                           -  Continue present medications.                           - Await pathology results. Mauri Pole, MD 02/09/2016 2:31:10 PM This report has been signed electronically.

## 2016-02-10 ENCOUNTER — Telehealth: Payer: Self-pay

## 2016-02-10 NOTE — Telephone Encounter (Signed)
  Follow up Call-  Call back number 02/09/2016  Post procedure Call Back phone  # [phone]  Permission to leave phone message Yes  Some recent data might be hidden     Patient questions:  Do you have a fever, pain , or abdominal swelling? No. Pain Score  0 *  Have you tolerated food without any problems? Yes.    Have you been able to return to your normal activities? Yes.    Do you have any questions about your discharge instructions: Diet   No. Medications  No. Follow up visit  No.  Do you have questions or concerns about your Care? No.  Actions: * If pain score is 4 or above: No action needed, pain <4.

## 2016-02-16 ENCOUNTER — Telehealth: Payer: Self-pay | Admitting: Gastroenterology

## 2016-02-16 NOTE — Telephone Encounter (Signed)
Advised her the pathology has not been reviewed. Reassured the report does not mention malignancy or dysplasia. Report is in EPIC.

## 2016-02-17 MED ORDER — FAMOTIDINE 20 MG PO TABS: 20.0000 mg | ORAL_TABLET | Freq: Every day | ORAL | 2 refills | Status: DC

## 2016-02-17 MED ORDER — ESOMEPRAZOLE MAGNESIUM 40 MG PO CPDR: 40.0000 mg | DELAYED_RELEASE_CAPSULE | Freq: Two times a day (BID) | ORAL | 3 refills | Status: DC

## 2016-02-17 NOTE — Telephone Encounter (Signed)
Patient is advised.  

## 2016-02-17 NOTE — Telephone Encounter (Signed)
Patient has mild chronic inactive gastritis. Negative for Helicobacter pylori, dysplasia or malignancy. Distal esophageal biopsies at esophageal gastric junction ulcerated mucosa with no evidence of malignancy or dysplasia He is advised patient to avoid naproxen or any nsaids. Switch to Nexium 40 mg twice daily, 30 minutes before breakfast and dinner. Additional Pepcid 20 mg at bedtime as needed. Follow antireflux measures. Follow-up in office in 6-8 weeks. We'll consider repeat EGD in 6-12 months  to document healing of severe erosive esophagitis or if continues to have persistent symptoms.

## 2016-02-18 ENCOUNTER — Encounter: Payer: Self-pay | Admitting: Gastroenterology

## 2016-02-23 ENCOUNTER — Encounter: Payer: Self-pay | Admitting: Family Medicine

## 2016-02-23 ENCOUNTER — Ambulatory Visit (INDEPENDENT_AMBULATORY_CARE_PROVIDER_SITE_OTHER): Payer: Medicare Other | Admitting: Family Medicine

## 2016-02-23 VITALS — BP 119/83 | HR 68 | Temp 98.0°F | Resp 17 | Ht 62.0 in | Wt 193.1 lb

## 2016-02-23 DIAGNOSIS — K219 Gastro-esophageal reflux disease without esophagitis: Secondary | ICD-10-CM | POA: Diagnosis not present

## 2016-02-23 DIAGNOSIS — E669 Obesity, unspecified: Secondary | ICD-10-CM | POA: Diagnosis not present

## 2016-02-23 DIAGNOSIS — I10 Essential (primary) hypertension: Secondary | ICD-10-CM | POA: Diagnosis not present

## 2016-02-23 DIAGNOSIS — R221 Localized swelling, mass and lump, neck: Secondary | ICD-10-CM | POA: Diagnosis not present

## 2016-02-23 LAB — CBC WITH DIFFERENTIAL/PLATELET
BASOS ABS: 0.1 10*3/uL (ref 0.0–0.1)
BASOS PCT: 0.7 % (ref 0.0–3.0)
EOS ABS: 0.1 10*3/uL (ref 0.0–0.7)
Eosinophils Relative: 1.2 % (ref 0.0–5.0)
HEMATOCRIT: 37.5 % (ref 36.0–46.0)
HEMOGLOBIN: 12.6 g/dL (ref 12.0–15.0)
LYMPHS PCT: 25.5 % (ref 12.0–46.0)
Lymphs Abs: 2 10*3/uL (ref 0.7–4.0)
MCHC: 33.5 g/dL (ref 30.0–36.0)
MCV: 89.3 fl (ref 78.0–100.0)
Monocytes Absolute: 0.4 10*3/uL (ref 0.1–1.0)
Monocytes Relative: 5.1 % (ref 3.0–12.0)
Neutro Abs: 5.2 10*3/uL (ref 1.4–7.7)
Neutrophils Relative %: 67.5 % (ref 43.0–77.0)
Platelets: 283 10*3/uL (ref 150.0–400.0)
RBC: 4.2 Mil/uL (ref 3.87–5.11)
RDW: 14.7 % (ref 11.5–15.5)
WBC: 7.7 10*3/uL (ref 4.0–10.5)

## 2016-02-23 LAB — BASIC METABOLIC PANEL
BUN: 15 mg/dL (ref 6–23)
CALCIUM: 9.6 mg/dL (ref 8.4–10.5)
CO2: 33 meq/L — AB (ref 19–32)
CREATININE: 0.82 mg/dL (ref 0.40–1.20)
Chloride: 99 mEq/L (ref 96–112)
GFR: 73.28 mL/min (ref >60.00)
Glucose, Bld: 103 mg/dL — ABNORMAL HIGH (ref 70–99)
Potassium: 3.4 mEq/L — ABNORMAL LOW (ref 3.5–5.1)
SODIUM: 142 meq/L (ref 135–145)

## 2016-02-23 LAB — LIPID PANEL
Cholesterol: 170 mg/dL (ref 0–200)
HDL: 40.3 mg/dL (ref >39.00)
LDL CALC: 102 mg/dL — AB (ref 0–99)
NONHDL: 129.85
Total CHOL/HDL Ratio: 4
Triglycerides: 138 mg/dL (ref 0.0–149.0)
VLDL: 27.6 mg/dL (ref 0.0–40.0)

## 2016-02-23 LAB — HEPATIC FUNCTION PANEL
ALT: 36 U/L — AB (ref 0–35)
AST: 32 U/L (ref 0–37)
Albumin: 4.1 g/dL (ref 3.5–5.2)
Alkaline Phosphatase: 105 U/L (ref 39–117)
BILIRUBIN TOTAL: 0.3 mg/dL (ref 0.2–1.2)
Bilirubin, Direct: 0.1 mg/dL (ref 0.0–0.3)
TOTAL PROTEIN: 6.5 g/dL (ref 6.0–8.3)

## 2016-02-23 LAB — TSH: TSH: 1.47 u[IU]/mL (ref 0.35–4.50)

## 2016-02-23 NOTE — Progress Notes (Signed)
Pre visit review using our clinic review tool, if applicable. No additional management support is needed unless otherwise documented below in the visit note. 

## 2016-02-23 NOTE — Patient Instructions (Signed)
Schedule your complete physical in 6 months We'll notify you of your lab results and make any changes if needed We'll call you with your ultrasound appt Try and eat more regularly and try to get daily activity- you can do it! Call with any questions or concerns Welcome!  We're glad to have you! Happy Holidays!!!

## 2016-02-23 NOTE — Progress Notes (Signed)
   Subjective:    Patient ID: Andrea Byrd, female    DOB: [birth year], 70 y.o.   MRN: [id number]  HPI New to establish.  Previous MD- Dr Tollie Pizza  HTN- chronic problem, on Metoprolol, Losartan HCTZ.  Denies CP, SOB, HAs, visual changes, edema.  GERD- chronic problem, on Nexium and Pepcid.  Following w/ GI.  Continues to have abdominal pain and difficulty eating.  Obesity- pt's BMI is 35.32  Not exercising regularly.  Admits to eating very infrequently due to stomach issues.  Bilateral 'swollen glands'- pt reports these have been enlarged for 'a long time'.  Intermittently TTP.  Has mentioned this to dentist, PA at [facility], and Dr Tollie Pizza.  No fevers, night sweats, unexplained weight loss.   Review of Systems For ROS see HPI     Objective:   Physical Exam  Constitutional: She is oriented to person, place, and time. She appears well-developed and well-nourished. No distress.  overweight  HENT:  Head: Normocephalic and atraumatic.  Eyes: Conjunctivae and EOM are normal. Pupils are equal, round, and reactive to light.  Neck: Normal range of motion. Neck supple. No thyromegaly present.  Bilateral soft tissue swelling at angle of mandible, no TTP  Cardiovascular: Normal rate, regular rhythm, normal heart sounds and intact distal pulses.   No murmur heard. Pulmonary/Chest: Effort normal and breath sounds normal. No respiratory distress.  Abdominal: Soft. She exhibits no distension. There is no tenderness.  Musculoskeletal: She exhibits no edema.  Neurological: She is alert and oriented to person, place, and time.  Skin: Skin is warm and dry.  Psychiatric: She has a normal mood and affect. Her behavior is normal.  Vitals reviewed.         Assessment & Plan:

## 2016-02-24 ENCOUNTER — Encounter: Payer: Self-pay | Admitting: General Practice

## 2016-02-24 NOTE — Assessment & Plan Note (Signed)
New to provider, ongoing for pt.  Currently well controlled today.  Asymptomatic.  Check labs.  No anticipated med changes.

## 2016-02-24 NOTE — Assessment & Plan Note (Signed)
Ongoing issue for pt.  She admits to poor eating habits and eating erratically.  Stressed need for regular eating, low carb diet, and regular exercise.  Check labs to risk stratify.  Will follow.

## 2016-02-24 NOTE — Assessment & Plan Note (Signed)
Chronic problem.  Following w/ GI.  Continues to struggle w/ pain.  Will follow along and assist as able.

## 2016-02-24 NOTE — Assessment & Plan Note (Signed)
New.  Bilateral at the angle of the mandible.  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  No TTP.  Will get [place] to assess.

## 2016-02-25 DIAGNOSIS — S93492D Sprain of other ligament of left ankle, subsequent encounter: Secondary | ICD-10-CM | POA: Diagnosis not present

## 2016-02-29 ENCOUNTER — Ambulatory Visit
Admission: RE | Admit: 2016-02-29 | Discharge: 2016-02-29 | Disposition: A | Discharge status: Home / Self Care | Payer: Medicare Other | Source: Ambulatory Visit | Attending: Family Medicine | Admitting: Family Medicine

## 2016-02-29 DIAGNOSIS — Z1231 Encounter for screening mammogram for malignant neoplasm of breast: Secondary | ICD-10-CM

## 2016-03-03 ENCOUNTER — Ambulatory Visit (HOSPITAL_COMMUNITY)
Admission: RE | Admit: 2016-03-03 | Discharge: 2016-03-03 | Disposition: A | Discharge status: Home / Self Care | Payer: Medicare Other | Source: Ambulatory Visit | Attending: Family Medicine | Admitting: Family Medicine

## 2016-03-03 DIAGNOSIS — R221 Localized swelling, mass and lump, neck: Secondary | ICD-10-CM | POA: Diagnosis not present

## 2016-03-03 NOTE — Progress Notes (Signed)
Called pt and lmovm to return call.

## 2016-03-15 DIAGNOSIS — Z0100 Encounter for examination of eyes and vision without abnormal findings: Secondary | ICD-10-CM | POA: Diagnosis not present

## 2016-03-15 DIAGNOSIS — H401312 Pigmentary glaucoma, right eye, moderate stage: Secondary | ICD-10-CM | POA: Diagnosis not present

## 2016-03-15 DIAGNOSIS — H2513 Age-related nuclear cataract, bilateral: Secondary | ICD-10-CM | POA: Diagnosis not present

## 2016-04-03 ENCOUNTER — Telehealth: Payer: Self-pay | Admitting: *Deleted

## 2016-04-03 MED ORDER — FAMOTIDINE 20 MG PO TABS: 20.0000 mg | ORAL_TABLET | Freq: Every day | ORAL | 4 refills | Status: DC

## 2016-04-03 NOTE — Telephone Encounter (Signed)
Famotidine 90 day supply requested from pharmacy by fax,, Submitted electronically

## 2016-04-07 ENCOUNTER — Encounter: Payer: Self-pay | Admitting: Gastroenterology

## 2016-04-07 ENCOUNTER — Ambulatory Visit (INDEPENDENT_AMBULATORY_CARE_PROVIDER_SITE_OTHER): Payer: Medicare Other | Admitting: Gastroenterology

## 2016-04-07 VITALS — BP 116/68 | HR 84 | Ht 62.21 in | Wt 196.0 lb

## 2016-04-07 DIAGNOSIS — R140 Abdominal distension (gaseous): Secondary | ICD-10-CM | POA: Diagnosis not present

## 2016-04-07 DIAGNOSIS — K219 Gastro-esophageal reflux disease without esophagitis: Secondary | ICD-10-CM

## 2016-04-07 DIAGNOSIS — K449 Diaphragmatic hernia without obstruction or gangrene: Secondary | ICD-10-CM

## 2016-04-07 DIAGNOSIS — R1310 Dysphagia, unspecified: Secondary | ICD-10-CM | POA: Diagnosis not present

## 2016-04-07 DIAGNOSIS — R1319 Other dysphagia: Secondary | ICD-10-CM

## 2016-04-07 NOTE — Patient Instructions (Addendum)
You have been scheduled for an esophageal manometry at [facility] on 04/24/2016 at 8:30am. Please arrive 30 minutes prior to your procedure for registration. You will need to go to outpatient registration (1st floor of the hospital) first. Make certain to bring your insurance cards as well as a complete list of medications.  Please remember the following:  1) Do not take any muscle relaxants, xanax (alprazolam) or ativan for 1 day prior to your     test as well as the day of the test.  2) Nothing to eat or drink after 12:00 midnight on the night before your test.  3) Hold all diabetic medications/insulin the morning of the test. You may eat and take             your medications after the test.  It will take at least 2 weeks to receive the results of this test from your physician. ------------------------------------------ ABOUT ESOPHAGEAL MANOMETRY Esophageal manometry (muh-NOM-uh-tree) is a test that gauges how well your esophagus works. Your esophagus is the long, muscular tube that connects your throat to your stomach. Esophageal manometry measures the rhythmic muscle contractions (peristalsis) that occur in your esophagus when you swallow. Esophageal manometry also measures the coordination and force exerted by the muscles of your esophagus.  During esophageal manometry, a thin, flexible tube (catheter) that contains sensors is passed through your nose, down your esophagus and into your stomach. Esophageal manometry can be helpful in diagnosing some mostly uncommon disorders that affect your esophagus.  Why it's done Esophageal manometry is used to evaluate the movement (motility) of food through the esophagus and into the stomach. The test measures how well the circular bands of muscle (sphincters) at the top and bottom of your esophagus open and close, as well as the pressure, strength and pattern of the wave of esophageal muscle contractions that moves food along.  What you can  expect Esophageal manometry is an outpatient procedure done without sedation. Most people tolerate it well. You may be asked to change into a hospital gown before the test starts.  During esophageal manometry  While you are sitting up, a member of your health care team sprays your throat with a numbing medication or puts numbing gel in your nose or both.  A catheter is guided through your nose into your esophagus. The catheter may be sheathed in a water-filled sleeve. It doesn't interfere with your breathing. However, your eyes may water, and you may gag. You may have a slight nosebleed from irritation.  After the catheter is in place, you may be asked to lie on your back on an exam table, or you may be asked to remain seated.  You then swallow small sips of water. As you do, a computer connected to the catheter records the pressure, strength and pattern of your esophageal muscle contractions.  During the test, you'll be asked to breathe slowly and smoothly, remain as still as possible, and swallow only when you're asked to do so.  A member of your health care team may move the catheter down into your stomach while the catheter continues its measurements.  The catheter then is slowly withdrawn. The test usually lasts 20 to 30 minutes.  After esophageal manometry  When your esophageal manometry is complete, you may return to your normal activities  This test typically takes 30-45 minutes to complete.   ________________________________________________________________________________  Take your Nexium twice a day  Use your Pepcid at bedtime

## 2016-04-07 NOTE — Progress Notes (Signed)
JAIDAN GUARDADO    UZ:[id number]    [birth year]  Primary Care Physician:[name], MD  Referring Physician: [name], MD [address] [place], [place] [address]  Chief complaint:  Dysphagia, regurgitation HPI: 71 year old female with history of chronic GERD, hiatal hernia here for follow-up visit. Patient complains of persistent symptoms with regurgitation after almost every meal and has intermittent difficulty swallowing, approximately 4-5 times a week. She feels it happens with both solids and liquids and she has sensation of food getting hung up in her throat and she has to swallow multiple times to get it down . She continues to have persistent heartburn mostly at bedtime almost daily basis . Denies any nausea, vomiting, abdominal pain, melena or bright red blood per rectum. EGD December 2017 showed evidence of erosive esophagitis LA grade C, nonobstructive Schatzki's ring that was approximately 20 mm or more. Hiatal hernia.   Outpatient Encounter Prescriptions as of 04/07/2016  Medication Sig  . aspirin 81 MG tablet Take 81 mg by mouth daily.  . cyclobenzaprine (FLEXERIL) 10 MG tablet Take 10 mg by mouth at bedtime.   [name] Sodium (STOOL SOFTENER) 100 MG capsule Take 100 mg by mouth at bedtime.  Marland Kitchen esomeprazole (NEXIUM) 40 MG capsule Take 1 capsule (40 mg total) by mouth 2 (two) times daily before a meal.  . famotidine (PEPCID) 20 MG tablet Take 1 tablet (20 mg total) by mouth at bedtime. As needed for stomach acid  . ferrous sulfate 325 (65 FE) MG tablet Take one po TID  . gabapentin (NEURONTIN) 300 MG capsule Take 2 capsules (600 mg total) by mouth 2 (two) times daily.  Marland Kitchen latanoprost (XALATAN) 0.005 % ophthalmic solution Place 1 drop into both eyes at bedtime.   Marland Kitchen losartan-hydrochlorothiazide (HYZAAR) 50-12.5 MG tablet TAKE 1 TABLET BY MOUTH EVERY DAY  . metoprolol (LOPRESSOR) 100 MG tablet Take 50 mg by mouth daily.  . naproxen sodium (ANAPROX) 220 MG  tablet Take 2 mg by mouth at bedtime.  . pramipexole (MIRAPEX) 1 MG tablet Take 1 tablet (1 mg total) by mouth 2 (two) times daily.   No facility-administered encounter medications on file as of 04/07/2016.     Allergies as of 04/07/2016 - Review Complete 04/07/2016  Allergen Reaction Noted  . Nitrofurantoin  02/04/2009  . Propoxyphene n-acetaminophen  02/04/2009  . Hydrocodone-homatropine Itching and Rash 09/01/2015  . Morphine Itching and Rash     Past Medical History:  Diagnosis Date  . Anemia   . Arthritis   . Cervical cancer ([facility]) 1977  . Diverticulosis   . Endometriosis   . Esophageal stricture   . Gastritis   . GERD (gastroesophageal reflux disease)   . Glaucoma   . Herpes simplex type 1 infection   . Hiatal hernia   . History of colon polyps   . History of gallstones   . Hypertension   . IBS (irritable bowel syndrome)   . Insomnia   . Restless leg syndrome     Past Surgical History:  Procedure Laterality Date  . ABDOMINAL HYSTERECTOMY  1998  . APPENDECTOMY  1998  . CERVICAL CONE BIOPSY    . CHOLECYSTECTOMY  1980  . COLON RESECTION  2002  . COLON SURGERY        . HAND SURGERY     right  . OOPHORECTOMY    . TUBAL LIGATION      Family History  Problem  Relation Age of Onset  . Diabetes Mother   . Stroke Mother     brother, sister, MGM  . Colon polyps Mother   . Heart disease Father   . Kidney disease Father   . Colon polyps Father   . Lung disease Father   . Kidney cancer Sister   . Stroke Sister   . Diabetes Sister   . Colon polyps Sister   . Celiac disease Other     niece  . Heart attack Paternal Grandfather   . Colon cancer Neg Hx   . Esophageal cancer Neg Hx   . Rectal cancer Neg Hx   . Stomach cancer Neg Hx     Social History   Social History  . Marital status: Married    Spouse name: N/A  . Number of children: 1  . Years of education: N/A   Occupational History  . Retired    Social History Main Topics  . Smoking status:  Never Smoker  . Smokeless tobacco: Never Used  . Alcohol use No  . Drug use: No  . Sexual activity: Not on file   Other Topics Concern  . Not on file   Social History Narrative   Lives with husband floyd   Drinks maybe 4 sodas a week      Review of systems: Review of Systems  Constitutional: Negative for fever and chills.  HENT: Negative.   Eyes: Negative for blurred vision.  Respiratory: Negative for cough, shortness of breath and wheezing.   Cardiovascular: Negative for chest pain and palpitations.  Gastrointestinal: as per HPI Genitourinary: Negative for dysuria, urgency, frequency and hematuria.  Musculoskeletal: Negative for myalgias, back pain and positive joint pain.  Skin: Negative for itching and rash.  Neurological: Negative for dizziness, tremors, focal weakness, seizures and loss of consciousness.  Endo/Heme/Allergies: Positive for seasonal allergies.  Psychiatric/Behavioral: Negative for depression, suicidal ideas and hallucinations.  All other systems reviewed and are negative.   Physical Exam: Vitals:   04/07/16 1326  BP: 116/68  Pulse: 84   Body mass index is 35.61 kg/m. Gen:      No acute distress HEENT:  EOMI, sclera anicteric Neck:     No masses; no thyromegaly Lungs:    Clear to auscultation bilaterally; normal respiratory effort CV:         Regular rate and rhythm; no murmurs Abd:      + bowel sounds; soft, non-tender; no palpable masses, no distension Ext:    No edema; adequate peripheral perfusion Skin:      Warm and dry; no rash Neuro: alert and oriented x 3 Psych: normal mood and affect  Data Reviewed:  Reviewed labs, radiology imaging, old records and pertinent past GI work up   Assessment and Plan/Recommendations: 71 year old female with history of chronic GERD and hiatal hernia here with complaints of persistent dysphagia and reflux symptoms  Dysphagia: No evidence of esophageal stricture thats obstructive based on recent  EGD Will schedule for esophageal manometry to further evaluate for possible esophageal dysmotility Also do 24 hour pH impedance on PPI at the same time to see if patient is having refractory GERD  GERD: Continue Nexium twice daily and Pepcid at bedtime Follow anti reflux measures  Bloating secondary to diet/IBS Ok to use phazyme as needed Advised patient to restrict foods that cause excessive gas, beans and do a trial of probiotic  25 minutes was spent face-to-face with the patient. Greater than 50% of the time used for counseling  as well as treatment plan and follow-up. She had multiple questions which were answered to her satisfaction  K. Denzil Magnuson , MD [phone] Mon-Fri 8a-5p [phone] after 5p, weekends, holidays  CC: [name], MD

## 2016-04-24 ENCOUNTER — Encounter (HOSPITAL_COMMUNITY)
Admission: RE | Disposition: A | Discharge status: Home / Self Care | Payer: Self-pay | Source: Ambulatory Visit | Attending: Gastroenterology

## 2016-04-24 ENCOUNTER — Ambulatory Visit (HOSPITAL_COMMUNITY)
Admission: RE | Admit: 2016-04-24 | Discharge: 2016-04-24 | Disposition: A | Discharge status: Home / Self Care | Payer: Medicare Other | Source: Ambulatory Visit | Attending: Gastroenterology | Admitting: Gastroenterology

## 2016-04-24 DIAGNOSIS — K219 Gastro-esophageal reflux disease without esophagitis: Secondary | ICD-10-CM | POA: Insufficient documentation

## 2016-04-24 DIAGNOSIS — R1310 Dysphagia, unspecified: Secondary | ICD-10-CM

## 2016-04-24 HISTORY — PX: ESOPHAGEAL MANOMETRY: SHX5429

## 2016-04-24 SURGERY — MANOMETRY, ESOPHAGUS

## 2016-04-24 MED ORDER — LIDOCAINE VISCOUS 2 % MT SOLN
OROMUCOSAL | Status: AC
  Filled 2016-04-24: qty 15

## 2016-04-24 SURGICAL SUPPLY — 2 items
FACESHIELD LNG OPTICON STERILE (SAFETY) IMPLANT
GLOVE BIO SURGEON STRL SZ8 (GLOVE) ×4 IMPLANT

## 2016-04-24 NOTE — Progress Notes (Signed)
Esophageal manometry performed. pt did not tolerate the manometry well and did not feel she could do the ph study.

## 2016-04-25 ENCOUNTER — Encounter (HOSPITAL_COMMUNITY): Payer: Self-pay | Admitting: Gastroenterology

## 2016-04-25 DIAGNOSIS — H2512 Age-related nuclear cataract, left eye: Secondary | ICD-10-CM | POA: Diagnosis not present

## 2016-04-25 DIAGNOSIS — H25812 Combined forms of age-related cataract, left eye: Secondary | ICD-10-CM | POA: Diagnosis not present

## 2016-05-12 ENCOUNTER — Telehealth: Payer: Self-pay | Admitting: Gastroenterology

## 2016-05-12 NOTE — Telephone Encounter (Signed)
She is looking for the manometry she had done.

## 2016-05-15 NOTE — Telephone Encounter (Signed)
Called patient and informed results, esophageal manometry is suggestive of Hypercontractile esophagus. Could be secondary to uncontrolled GERD. Will switch to Dexilant 60mg  daily instead of Nexium twice daily. Continue H2 blocker at bedtime Baclofen 5mg  Three times daily trial for 2 week and advised patient to call back in 2 weeks to let us know if she has any change in symptoms. Hold flexeril while she is taking Baclofen Nortriptyline 10mg   At bedtime Beth, can please send Rx . Thanks

## 2016-05-16 ENCOUNTER — Other Ambulatory Visit: Payer: Self-pay

## 2016-05-16 MED ORDER — BACLOFEN 10 MG PO TABS: 5.0000 mg | ORAL_TABLET | Freq: Three times a day (TID) | ORAL | 0 refills | Status: DC

## 2016-05-16 MED ORDER — DEXLANSOPRAZOLE 60 MG PO CPDR: 60.0000 mg | DELAYED_RELEASE_CAPSULE | Freq: Every day | ORAL | 3 refills | Status: DC

## 2016-05-16 MED ORDER — NORTRIPTYLINE HCL 10 MG PO CAPS: 10.0000 mg | ORAL_CAPSULE | Freq: Every day | ORAL | 0 refills | Status: DC

## 2016-05-16 NOTE — Telephone Encounter (Signed)
Left the patient a message that her prescription have been sent to the pharmacy as of today.

## 2016-05-24 DIAGNOSIS — M1712 Unilateral primary osteoarthritis, left knee: Secondary | ICD-10-CM | POA: Diagnosis not present

## 2016-05-24 DIAGNOSIS — M25551 Pain in right hip: Secondary | ICD-10-CM | POA: Diagnosis not present

## 2016-05-24 DIAGNOSIS — M7061 Trochanteric bursitis, right hip: Secondary | ICD-10-CM | POA: Diagnosis not present

## 2016-06-01 DIAGNOSIS — M5431 Sciatica, right side: Secondary | ICD-10-CM | POA: Diagnosis not present

## 2016-06-01 DIAGNOSIS — M5432 Sciatica, left side: Secondary | ICD-10-CM | POA: Diagnosis not present

## 2016-06-01 DIAGNOSIS — M9903 Segmental and somatic dysfunction of lumbar region: Secondary | ICD-10-CM | POA: Diagnosis not present

## 2016-06-05 DIAGNOSIS — M5431 Sciatica, right side: Secondary | ICD-10-CM | POA: Diagnosis not present

## 2016-06-05 DIAGNOSIS — M5432 Sciatica, left side: Secondary | ICD-10-CM | POA: Diagnosis not present

## 2016-06-05 DIAGNOSIS — M9903 Segmental and somatic dysfunction of lumbar region: Secondary | ICD-10-CM | POA: Diagnosis not present

## 2016-06-09 NOTE — Telephone Encounter (Signed)
Called the patient for follow up of her symptoms. She states she is doing well. She has stopped all the GI medications except for an OTC made by [facility] for acid called Ultra Strength Soft Chews which she uses PRN. She thanked me for the call. She will contact us if needed.

## 2016-06-12 ENCOUNTER — Other Ambulatory Visit: Payer: Self-pay | Admitting: Gastroenterology

## 2016-06-12 DIAGNOSIS — M5431 Sciatica, right side: Secondary | ICD-10-CM | POA: Diagnosis not present

## 2016-06-12 DIAGNOSIS — M9903 Segmental and somatic dysfunction of lumbar region: Secondary | ICD-10-CM | POA: Diagnosis not present

## 2016-06-12 DIAGNOSIS — M5432 Sciatica, left side: Secondary | ICD-10-CM | POA: Diagnosis not present

## 2016-06-12 NOTE — Telephone Encounter (Signed)
Patient wants refills of nortriptyline and baclofen  Is it ok to refill these?

## 2016-06-13 DIAGNOSIS — H25811 Combined forms of age-related cataract, right eye: Secondary | ICD-10-CM | POA: Diagnosis not present

## 2016-06-13 DIAGNOSIS — H2511 Age-related nuclear cataract, right eye: Secondary | ICD-10-CM | POA: Diagnosis not present

## 2016-06-15 DIAGNOSIS — M9903 Segmental and somatic dysfunction of lumbar region: Secondary | ICD-10-CM | POA: Diagnosis not present

## 2016-06-15 DIAGNOSIS — M5432 Sciatica, left side: Secondary | ICD-10-CM | POA: Diagnosis not present

## 2016-06-15 DIAGNOSIS — M5431 Sciatica, right side: Secondary | ICD-10-CM | POA: Diagnosis not present

## 2016-06-19 DIAGNOSIS — M5432 Sciatica, left side: Secondary | ICD-10-CM | POA: Diagnosis not present

## 2016-06-19 DIAGNOSIS — M5431 Sciatica, right side: Secondary | ICD-10-CM | POA: Diagnosis not present

## 2016-06-19 DIAGNOSIS — M9903 Segmental and somatic dysfunction of lumbar region: Secondary | ICD-10-CM | POA: Diagnosis not present

## 2016-06-26 DIAGNOSIS — M9903 Segmental and somatic dysfunction of lumbar region: Secondary | ICD-10-CM | POA: Diagnosis not present

## 2016-06-26 DIAGNOSIS — M5431 Sciatica, right side: Secondary | ICD-10-CM | POA: Diagnosis not present

## 2016-06-26 DIAGNOSIS — M5432 Sciatica, left side: Secondary | ICD-10-CM | POA: Diagnosis not present

## 2016-06-28 DIAGNOSIS — M9903 Segmental and somatic dysfunction of lumbar region: Secondary | ICD-10-CM | POA: Diagnosis not present

## 2016-06-28 DIAGNOSIS — M5431 Sciatica, right side: Secondary | ICD-10-CM | POA: Diagnosis not present

## 2016-06-28 DIAGNOSIS — M5432 Sciatica, left side: Secondary | ICD-10-CM | POA: Diagnosis not present

## 2016-07-03 DIAGNOSIS — M9903 Segmental and somatic dysfunction of lumbar region: Secondary | ICD-10-CM | POA: Diagnosis not present

## 2016-07-03 DIAGNOSIS — M5432 Sciatica, left side: Secondary | ICD-10-CM | POA: Diagnosis not present

## 2016-07-03 DIAGNOSIS — M5431 Sciatica, right side: Secondary | ICD-10-CM | POA: Diagnosis not present

## 2016-07-07 ENCOUNTER — Other Ambulatory Visit: Payer: Self-pay | Admitting: Gastroenterology

## 2016-07-10 DIAGNOSIS — M5432 Sciatica, left side: Secondary | ICD-10-CM | POA: Diagnosis not present

## 2016-07-10 DIAGNOSIS — M5431 Sciatica, right side: Secondary | ICD-10-CM | POA: Diagnosis not present

## 2016-07-10 DIAGNOSIS — M9903 Segmental and somatic dysfunction of lumbar region: Secondary | ICD-10-CM | POA: Diagnosis not present

## 2016-07-12 ENCOUNTER — Other Ambulatory Visit: Payer: Self-pay | Admitting: Family Medicine

## 2016-07-17 DIAGNOSIS — M5432 Sciatica, left side: Secondary | ICD-10-CM | POA: Diagnosis not present

## 2016-07-17 DIAGNOSIS — M9903 Segmental and somatic dysfunction of lumbar region: Secondary | ICD-10-CM | POA: Diagnosis not present

## 2016-07-17 DIAGNOSIS — M5431 Sciatica, right side: Secondary | ICD-10-CM | POA: Diagnosis not present

## 2016-07-18 ENCOUNTER — Other Ambulatory Visit: Payer: Self-pay | Admitting: Gastroenterology

## 2016-07-18 NOTE — Telephone Encounter (Signed)
Pt wants refills of Baclofen and nortriptyline  Is this ok to send

## 2016-07-18 NOTE — Telephone Encounter (Signed)
Ok to refill   thanks

## 2016-07-24 DIAGNOSIS — M5431 Sciatica, right side: Secondary | ICD-10-CM | POA: Diagnosis not present

## 2016-07-24 DIAGNOSIS — M9903 Segmental and somatic dysfunction of lumbar region: Secondary | ICD-10-CM | POA: Diagnosis not present

## 2016-07-24 DIAGNOSIS — M5432 Sciatica, left side: Secondary | ICD-10-CM | POA: Diagnosis not present

## 2016-07-26 ENCOUNTER — Encounter: Payer: Self-pay | Admitting: Gastroenterology

## 2016-08-01 DIAGNOSIS — M9903 Segmental and somatic dysfunction of lumbar region: Secondary | ICD-10-CM | POA: Diagnosis not present

## 2016-08-01 DIAGNOSIS — M5431 Sciatica, right side: Secondary | ICD-10-CM | POA: Diagnosis not present

## 2016-08-01 DIAGNOSIS — M5432 Sciatica, left side: Secondary | ICD-10-CM | POA: Diagnosis not present

## 2016-08-08 ENCOUNTER — Ambulatory Visit (AMBULATORY_SURGERY_CENTER): Payer: Self-pay

## 2016-08-08 VITALS — Ht 63.0 in | Wt 193.4 lb

## 2016-08-08 DIAGNOSIS — R1310 Dysphagia, unspecified: Secondary | ICD-10-CM

## 2016-08-08 DIAGNOSIS — R1319 Other dysphagia: Secondary | ICD-10-CM

## 2016-08-08 DIAGNOSIS — M9903 Segmental and somatic dysfunction of lumbar region: Secondary | ICD-10-CM | POA: Diagnosis not present

## 2016-08-08 DIAGNOSIS — M5432 Sciatica, left side: Secondary | ICD-10-CM | POA: Diagnosis not present

## 2016-08-08 DIAGNOSIS — M5431 Sciatica, right side: Secondary | ICD-10-CM | POA: Diagnosis not present

## 2016-08-08 NOTE — Progress Notes (Signed)
Per pt, no allergies to soy or egg products.Pt not taking any weight loss meds or using  O2 at home.   Pt refused Emmi video. 

## 2016-08-21 ENCOUNTER — Encounter: Payer: Self-pay | Admitting: Gastroenterology

## 2016-08-21 ENCOUNTER — Ambulatory Visit (AMBULATORY_SURGERY_CENTER): Payer: Medicare Other | Admitting: Gastroenterology

## 2016-08-21 VITALS — BP 114/74 | HR 71 | Temp 98.9°F | Resp 12 | Ht 63.0 in | Wt 193.0 lb

## 2016-08-21 DIAGNOSIS — K2990 Gastroduodenitis, unspecified, without bleeding: Secondary | ICD-10-CM | POA: Diagnosis not present

## 2016-08-21 DIAGNOSIS — K2970 Gastritis, unspecified, without bleeding: Secondary | ICD-10-CM

## 2016-08-21 DIAGNOSIS — K2950 Unspecified chronic gastritis without bleeding: Secondary | ICD-10-CM | POA: Diagnosis not present

## 2016-08-21 DIAGNOSIS — K222 Esophageal obstruction: Secondary | ICD-10-CM

## 2016-08-21 DIAGNOSIS — R1319 Other dysphagia: Secondary | ICD-10-CM

## 2016-08-21 DIAGNOSIS — R1310 Dysphagia, unspecified: Secondary | ICD-10-CM

## 2016-08-21 MED ORDER — FAMOTIDINE 20 MG PO TABS: 20.0000 mg | ORAL_TABLET | Freq: Every day | ORAL | 4 refills | Status: DC

## 2016-08-21 MED ORDER — SUCRALFATE 1 GM/10ML PO SUSP
1.0000 g | Freq: Four times a day (QID) | ORAL | 1 refills | Status: DC
Start: 2016-08-21 — End: 2017-01-04

## 2016-08-21 MED ORDER — OMEPRAZOLE 40 MG PO CPDR
40.0000 mg | DELAYED_RELEASE_CAPSULE | Freq: Two times a day (BID) | ORAL | 3 refills | Status: DC
Start: 2016-08-21 — End: 2019-07-02

## 2016-08-21 MED ORDER — SODIUM CHLORIDE 0.9 % IV SOLN: 500.0000 mL | INTRAVENOUS | Status: DC

## 2016-08-21 NOTE — Op Note (Addendum)
[facility] Patient Name: Andrea Byrd Procedure Date: 08/21/2016 9:58 AM MRN: [id number] Endoscopist: Mauri Pole , MD Age: 71 Referring MD:  Date of Birth: 1945-09-29 Gender: Female Account #: [id number] Procedure:                Upper GI endoscopy Indications:              Dysphagia, Stenosis of the esophagus, Follow-up of                            esophageal stenosis, Gastrojejunal ulcer Medicines:                Monitored Anesthesia Care Procedure:                Pre-Anesthesia Assessment:                           - Prior to the procedure, a History and Physical                            was performed, and patient medications and                            allergies were reviewed. The patient's tolerance of                            previous anesthesia was also reviewed. The risks                            and benefits of the procedure and the sedation                            options and risks were discussed with the patient.                            All questions were answered, and informed consent                            was obtained. Prior Anticoagulants: The patient has                            taken no previous anticoagulant or antiplatelet                            agents. ASA Grade Assessment: II - A patient with                            mild systemic disease. After reviewing the risks                            and benefits, the patient was deemed in                            satisfactory condition to undergo the procedure.  After obtaining informed consent, the endoscope was                            passed under direct vision. Throughout the                            procedure, the patient's blood pressure, pulse, and                            oxygen saturations were monitored continuously. The                            Endoscope was introduced through the mouth, and                            advanced to  the second part of duodenum. The upper                            GI endoscopy was accomplished without difficulty.                            The patient tolerated the procedure well. Scope In: Scope Out: Findings:                 LA Grade C (one or more mucosal breaks continuous                            between tops of 2 or more mucosal folds, less than                            75% circumference) esophagitis with no bleeding was                            found 34 to 35 cm from the incisors.                           One mild (non-circumferential scarring)                            benign-appearing, intrinsic stenosis was found 34                            to 35 cm from the incisors. This measured 2 cm                            (inner diameter) x 1 cm (in length) and was                            traversed. A TTS dilator was passed through the                            scope. Dilation with an 18-19-20 mm balloon dilator  was performed to 20 mm. The dilation site was                            examined following endoscope reinsertion and showed                            no change.                           Localized severe inflammation with hemorrhage                            characterized by adherent blood, congestion                            (edema), erosions, erythema, friability and mucus                            was found in the gastric antrum. Biopsies were                            taken with a cold forceps for histology.                           The examined duodenum was normal. Complications:            No immediate complications. Estimated Blood Loss:     Estimated blood loss was minimal. Impression:               - LA Grade C reflux esophagitis.                           - Benign-appearing esophageal stenosis. Dilated.                           - Gastritis with hemorrhage likely secondary to                            NSAID's.  Biopsied.                           - Normal examined duodenum. Recommendation:           - Resume previous diet.                           - Continue present medications.                           - Restart PPI, Omeprazole 40mg  twice daily before                            breakfast and dinner, and Pepcid 20mg  at bedtime as                            needed                           -  Carafate 1gm suspension before meals and bedtime                            as needed                           - No ibuprofen, naproxen, or other non-steroidal                            anti-inflammatory drugs.                           - Await pathology results.                           - Return to GI office next available Mauri Pole, MD 08/21/2016 10:26:30 AM This report has been signed electronically.

## 2016-08-21 NOTE — Progress Notes (Signed)
Called to room to assist during endoscopic procedure.  Patient ID and intended procedure confirmed with present staff. Received instructions for my participation in the procedure from the performing physician.  

## 2016-08-21 NOTE — Patient Instructions (Signed)
YOU HAD AN ENDOSCOPIC PROCEDURE TODAY AT [facility]:   Refer to the procedure report that was given to you for any specific questions about what was found during the examination.  If the procedure report does not answer your questions, please call your gastroenterologist to clarify.  If you requested that your care partner not be given the details of your procedure findings, then the procedure report has been included in a sealed envelope for you to review at your convenience later.  YOU SHOULD EXPECT: Some feelings of bloating in the abdomen. Passage of more gas than usual.  Walking can help get rid of the air that was put into your GI tract during the procedure and reduce the bloating. If you had a lower endoscopy (such as a colonoscopy or flexible sigmoidoscopy) you may notice spotting of blood in your stool or on the toilet paper. If you underwent a bowel prep for your procedure, you may not have a normal bowel movement for a few days.  Please Note:  You might notice some irritation and congestion in your nose or some drainage.  This is from the oxygen used during your procedure.  There is no need for concern and it should clear up in a day or so.  SYMPTOMS TO REPORT IMMEDIATELY:    Following upper endoscopy (EGD)  Vomiting of blood or coffee ground material  New chest pain or pain under the shoulder blades  Painful or persistently difficult swallowing  New shortness of breath  Fever of 100F or higher  Black, tarry-looking stools  For urgent or emergent issues, a gastroenterologist can be reached at any hour by calling [phone].   DIET:  We do recommend a small meal at first, but then you may proceed to your regular diet.  Drink plenty of fluids but you should avoid alcoholic beverages for 24 hours.  ACTIVITY:  You should plan to take it easy for the rest of today and you should NOT DRIVE or use heavy machinery until tomorrow (because of the sedation medicines used  during the test).    FOLLOW UP: Our staff will call the number listed on your records the next business day following your procedure to check on you and address any questions or concerns that you may have regarding the information given to you following your procedure. If we do not reach you, we will leave a message.  However, if you are feeling well and you are not experiencing any problems, there is no need to return our call.  We will assume that you have returned to your regular daily activities without incident.  If any biopsies were taken you will be contacted by phone or by letter within the next 1-3 weeks.  Please call us at [phone] if you have not heard about the biopsies in 3 weeks.  October 10, 2016 at 3 pm office visit.  Omeprazole 40 mg take twice a day Carafate 1 gram suspension take 4 times a day Await for biopsy results  No Ibuprofen, naproxen, or other non-steriodal anti-inflammatory drugs  SIGNATURES/CONFIDENTIALITY: You and/or your care partner have signed paperwork which will be entered into your electronic medical record.  These signatures attest to the fact that that the information above on your After Visit Summary has been reviewed and is understood.  Full responsibility of the confidentiality of this discharge information lies with you and/or your care-partner.

## 2016-08-21 NOTE — Progress Notes (Signed)
To recovery, repoirt to Ronnald Ramp, Therapist, sports, [facility]

## 2016-08-21 NOTE — Progress Notes (Signed)
Pt's states no medical or surgical changes since previsit or office visit. 

## 2016-08-22 ENCOUNTER — Telehealth: Payer: Self-pay | Admitting: *Deleted

## 2016-08-22 NOTE — Telephone Encounter (Signed)
  Follow up Call-  Call back number 08/21/2016 02/09/2016  Post procedure Call Back phone  # [phone]  Permission to leave phone message Yes Yes  Some recent data might be hidden     Patient questions:  Do you have a fever, pain , or abdominal swelling? No. Pain Score  0 *  Have you tolerated food without any problems? Yes.    Have you been able to return to your normal activities? Yes.    Do you have any questions about your discharge instructions: Diet   No. Medications  No. Follow up visit  No.  Do you have questions or concerns about your Care? No.  Actions: * If pain score is 4 or above: No action needed, pain <4.

## 2016-08-22 NOTE — Telephone Encounter (Signed)
No answer, message left for the patient. 

## 2016-08-24 DIAGNOSIS — M9903 Segmental and somatic dysfunction of lumbar region: Secondary | ICD-10-CM | POA: Diagnosis not present

## 2016-08-24 DIAGNOSIS — M5432 Sciatica, left side: Secondary | ICD-10-CM | POA: Diagnosis not present

## 2016-08-24 DIAGNOSIS — M5431 Sciatica, right side: Secondary | ICD-10-CM | POA: Diagnosis not present

## 2016-08-25 ENCOUNTER — Encounter: Payer: Self-pay | Admitting: Gastroenterology

## 2016-08-29 ENCOUNTER — Encounter: Payer: Self-pay | Admitting: Diagnostic Neuroimaging

## 2016-08-29 ENCOUNTER — Telehealth: Payer: Self-pay | Admitting: *Deleted

## 2016-08-29 ENCOUNTER — Ambulatory Visit (INDEPENDENT_AMBULATORY_CARE_PROVIDER_SITE_OTHER): Payer: Medicare Other | Admitting: Diagnostic Neuroimaging

## 2016-08-29 VITALS — BP 107/69 | HR 80 | Wt 191.8 lb

## 2016-08-29 DIAGNOSIS — M5416 Radiculopathy, lumbar region: Secondary | ICD-10-CM | POA: Diagnosis not present

## 2016-08-29 DIAGNOSIS — G2581 Restless legs syndrome: Secondary | ICD-10-CM | POA: Diagnosis not present

## 2016-08-29 MED ORDER — GABAPENTIN 300 MG PO CAPS: 600.0000 mg | ORAL_CAPSULE | Freq: Two times a day (BID) | ORAL | 4 refills | Status: DC

## 2016-08-29 MED ORDER — PRAMIPEXOLE DIHYDROCHLORIDE 1 MG PO TABS: 1.0000 mg | ORAL_TABLET | Freq: Two times a day (BID) | ORAL | 4 refills | Status: DC

## 2016-08-29 NOTE — Telephone Encounter (Signed)
Carafate suspension approved through [employer] until 08/24/2017

## 2016-08-29 NOTE — Progress Notes (Signed)
[facility]  PATIENT: Andrea Byrd DOB: 01-04-1946  REFERRING CLINICIAN: S[place] HISTORY FROM: patient REASON FOR VISIT: follow up   HISTORICAL  CHIEF COMPLAINT:  Chief Complaint  Patient presents with  . Restless leg syndrome    rm 7, "RLS is fine as long as I take my medicine; I cannot cut back, it won't work"  . Follow-up    one year    HISTORY OF PRESENT ILLNESS:   UPDATE 08/29/16: Since last visit, RLS stable on gabapentin and pramipexole. Sharp pains in toes and feet slightly worse.   UPDATE 09/01/15: Since last visit, doing well. On gabapentin 600mg  BID + pramipexole 1mg  BID. Sxs well controlled. No side effects.   UPDATE 08/25/14: Since last visit, doing well. Has had chiro tx for back issues and feels better. On gabapentin 300mg  TID + pramipex 1mg  qhs. Satisfied with current dosing.   UPDATE 04/17/14: Since last visit, symptoms have continued. Numbness in feet, restless legs at night. On gabapentin 300mg  TID. PCP also tried her on pramipexole 0.25mg x3 at bedtime.   UPDATE 08/13/12: 71 year old right-handed female with history of hypertension, restless leg syndrome, here for evaluation of progressive numbness and tingling in her toes and feet. I previously saw this patient in October 2013 as referred by primary care physician. Now patient is referred to me by orthopedic surgery. Since last visit patient's symptoms have progressed. Now she has numbness and tingling in her bilateral feet up to her ankles. She has intermittent sharp sensations as well. Symptoms are fairly symmetric. No symptoms in her upper legs, lower back, torso, arms or neck.  PRIOR HPI (12/18/11): 71 year old right-handed female with history of hypertension, this is a syndrome, here for evaluation of numbness in her feet for the past 3 months. Patient describes numbness and tingling in her toes and bottom of her feet. No significant pain. She also feels coldness in her feet. No back pain or  symptoms radiating from her back to her feet. No symptoms in her hands or fingers. No neck pain. Symptoms are mild and gradually progressing. 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 She did not have any numbness in the symptoms at that time.  REVIEW OF SYSTEMS: Full 14 system review of systems performed and negative except: RLS and joint pain.    ALLERGIES: Allergies  Allergen Reactions  . Nitrofurantoin     Makes mouth break out/blisters  . Propoxyphene N-Acetaminophen     REACTION: nausea  . Dexilant [Dexlansoprazole] Nausea And Vomiting  . Nexium [Esomeprazole Magnesium] Nausea And Vomiting  . Nortriptyline Hcl Nausea And Vomiting  . Hydrocodone-Homatropine Itching and Rash  . Morphine Itching and Rash    HOME MEDICATIONS: Outpatient Medications Prior to Visit  Medication Sig Dispense Refill  . aspirin 81 MG tablet Take 81 mg by mouth daily.    . cyclobenzaprine (FLEXERIL) 10 MG tablet Take 10 mg by mouth at bedtime.     [name] Sodium (STOOL SOFTENER) 100 MG capsule Take 100 mg by mouth at bedtime.    . famotidine (PEPCID) 20 MG tablet Take 1 tablet (20 mg total) by mouth at bedtime. As needed for stomach acid 90 tablet 4  . ferrous sulfate 325 (65 FE) MG tablet Take one po TID 90 tablet 3  . gabapentin (NEURONTIN) 300 MG capsule Take 2 capsules (600 mg total) by mouth 2 (two) times daily. 360 capsule 4  .  latanoprost (XALATAN) 0.005 % ophthalmic solution Place 1 drop into both eyes at bedtime.     Marland Kitchen losartan-hydrochlorothiazide (HYZAAR) 50-12.5 MG tablet TAKE 1 TABLET BY MOUTH EVERY DAY    . metoprolol (LOPRESSOR) 100 MG tablet Take 50 mg by mouth daily.    Marland Kitchen omeprazole (PRILOSEC) 40 MG capsule Take 1 capsule (40 mg total) by mouth 2 (two) times daily. 120 capsule 3  . pramipexole (MIRAPEX) 1 MG tablet Take 1 tablet (1 mg total) by mouth 2 (two) times daily. 180 tablet 4  .  sucralfate (CARAFATE) 1 GM/10ML suspension Take 10 mLs (1 g total) by mouth 4 (four) times daily. 420 mL 1  . naproxen sodium (ANAPROX) 220 MG tablet Take 2 mg by mouth at bedtime.    Marland Kitchen dexlansoprazole (DEXILANT) 60 MG capsule Take 1 capsule (60 mg total) by mouth daily. (Patient not taking: Reported on 08/08/2016) 30 capsule 3  . esomeprazole (NEXIUM) 40 MG capsule Take 1 capsule (40 mg total) by mouth 2 (two) times daily before a meal. (Patient not taking: Reported on 08/08/2016) 60 capsule 3  . nortriptyline (PAMELOR) 10 MG capsule TAKE 1 CAPSULE (10 MG TOTAL) BY MOUTH AT BEDTIME. (Patient not taking: Reported on 08/08/2016) 30 capsule 0   Facility-Administered Medications Prior to Visit  Medication Dose Route Frequency Provider Last Rate Last Dose  . 0.9 %  sodium chloride infusion  500 mL Intravenous Continuous Nandigam, Venia Minks, MD        PAST MEDICAL HISTORY: Past Medical History:  Diagnosis Date  . Anemia   . Arthritis   . Cervical cancer ([place]) 1977  . Diverticulosis   . Endometriosis   . Esophageal stricture   . Gastritis   . GERD (gastroesophageal reflux disease)   . Glaucoma   . Herpes simplex type 1 infection   . Hiatal hernia   . History of colon polyps   . History of gallstones   . Hypertension   . IBS (irritable bowel syndrome)   . Insomnia   . Post-operative nausea and vomiting   . Restless leg syndrome     PAST SURGICAL HISTORY: Past Surgical History:  Procedure Laterality Date  . ABDOMINAL HYSTERECTOMY  1998  . APPENDECTOMY  1998  . CERVICAL CONE BIOPSY    . CHOLECYSTECTOMY  1980  . COLON RESECTION  2002  . COLON SURGERY  2003   removed 2 feet colon  . ESOPHAGEAL MANOMETRY N/A 04/24/2016   Procedure: ESOPHAGEAL MANOMETRY (EM);  Surgeon: Mauri Pole, MD;  Location: [facility];  Service: Endoscopy;  Laterality: N/A;  . HAND SURGERY     right/ thumb joints replaced both hands  . OOPHORECTOMY    . TUBAL LIGATION      FAMILY HISTORY: Family  History  Problem Relation Age of Onset  . Diabetes Mother   . Stroke Mother        brother, sister, MGM  . Colon polyps Mother   . Heart disease Father   . Kidney disease Father   . Colon polyps Father   . Lung disease Father   . Kidney cancer Sister   . Stroke Sister   . Diabetes Sister   . Colon polyps Sister   . Celiac disease Other        niece  . Heart attack Paternal Grandfather   . Colon cancer Neg Hx   . Esophageal cancer Neg Hx   . Rectal cancer Neg Hx   . Stomach cancer Neg Hx  SOCIAL HISTORY:  Social History   Social History  . Marital status: Married    Spouse name: N/A  . Number of children: 1  . Years of education: N/A   Occupational History  . Retired    Social History Main Topics  . Smoking status: Never Smoker  . Smokeless tobacco: Never Used  . Alcohol use No  . Drug use: No  . Sexual activity: Not on file   Other Topics Concern  . Not on file   Social History Narrative   Lives with husband floyd   Drinks maybe 4 sodas a week     PHYSICAL EXAM  Vitals:   08/29/16 0801  BP: 107/69  Pulse: 80  Weight: 191 lb 12.8 oz (87 kg)    Not recorded      Body mass index is 33.98 kg/m.  EXAM: General: Patient is awake, alert and in no acute distress.  Well developed and groomed. Neck: Neck is supple. Cardiovascular: No carotid artery bruits.  Heart is regular rate and rhythm with no murmurs.  Neurologic Exam  Mental Status: Awake, alert. Language is fluent and comprehension intact. Cranial Nerves: Pupils are equal and reactive to light.  Visual fields are full to confrontation.  Conjugate eye movements are full and symmetric.  Facial sensation symmetric.  MILD LEFT HEMIFACIAL SPASM (PRIOR LEFT BELL'S PALSY). Hearing is intact.  Palate elevated symmetrically and uvula is midline.  Shoulder shrug is symmetric.  Tongue is midline. Motor: Normal bulk and tone.  Full strength in the upper and lower extremities. No pronator drift. Sensory:  Intact and symmetric to light touch; EXCEPT DECR PP AND VIB IN TOES Coordination: No ataxia or dysmetria on finger-nose or rapid alternating movement testing. Reflexes: BUE 2, KNEES TRACE, ANKLES TRACE Gait and Station: Narrow based gait   DIAGNOSTIC DATA (LABS, IMAGING, TESTING) - I reviewed patient records, labs, notes, testing and imaging myself where available.  Lab Results  Component Value Date   WBC 7.7 02/23/2016   HGB 12.6 02/23/2016   HCT 37.5 02/23/2016   MCV 89.3 02/23/2016   PLT 283.0 02/23/2016      Component Value Date/Time   NA 142 02/23/2016 1515   NA 141 10/11/2015   K 3.4 (L) 02/23/2016 1515   CL 99 02/23/2016 1515   CO2 33 (H) 02/23/2016 1515   GLUCOSE 103 (H) 02/23/2016 1515   BUN 15 02/23/2016 1515   BUN 13 10/11/2015   CREATININE 0.82 02/23/2016 1515   CALCIUM 9.6 02/23/2016 1515   PROT 6.5 02/23/2016 1515   ALBUMIN 4.1 02/23/2016 1515   AST 32 02/23/2016 1515   ALT 36 (H) 02/23/2016 1515   ALKPHOS 105 02/23/2016 1515   BILITOT 0.3 02/23/2016 1515   Lab Results  Component Value Date   CHOL 170 02/23/2016   No results found for: HGBA1C No results found for: VITAMINB12 Lab Results  Component Value Date   TSH 1.47 02/23/2016    TSH 1.35  B12 826  08/27/12 NCV/EMG - electrodiagnostic evidence of bilateral S1 radiculopathies. - left median neuropathy at the wrist consistent with left carpal tunnel syndrome.  09/20/12 MRI lumbar spine (without) demonstrating: - At L5-S1: disc bulging with mild-moderate biforaminal foraminal stenosis - At L3-4, L4-5: disc bulging and facet hypertrophy with no spinal stenosis and mild biforaminal narrowing    ASSESSMENT AND PLAN  71 y.o. year old female here with progressive numbness in feet, likely due to lumbar radiculopathies. Also with associated restless leg symptoms. Doing well  on gabapentin and pramipexole.   Dx: lumbar radiculopathies (S1) + RLS  Restless legs syndrome (RLS)  Lumbar radiculopathy     PLAN:  I spent 15 minutes of face to face time with patient. Greater than 50% of time was spent in counseling and coordination of care with patient. In summary we discussed:  RLS (established problem, stable) - continue gabapentin (600mg  BID) - continue pramipexole (1mg  BID)  LUMBAR RADICULOPATHIES (established problem, slight worsening) - continue chiropractic treatments and PT exercises  Meds ordered this encounter  Medications  . pramipexole (MIRAPEX) 1 MG tablet    Sig: Take 1 tablet (1 mg total) by mouth 2 (two) times daily.    Dispense:  180 tablet    Refill:  4  . gabapentin (NEURONTIN) 300 MG capsule    Sig: Take 2 capsules (600 mg total) by mouth 2 (two) times daily.    Dispense:  360 capsule    Refill:  4   Return in about 1 year (around 08/29/2017).     Penni Bombard, MD 3/57/8978, 4:78 AM Certified in Neurology, Neurophysiology and Neuroimaging  [facility] [address], [place], [place] [address] [phone]

## 2016-08-30 ENCOUNTER — Encounter: Payer: Self-pay | Admitting: General Practice

## 2016-08-30 ENCOUNTER — Ambulatory Visit (INDEPENDENT_AMBULATORY_CARE_PROVIDER_SITE_OTHER): Payer: Medicare Other | Admitting: Family Medicine

## 2016-08-30 ENCOUNTER — Encounter: Payer: Self-pay | Admitting: Family Medicine

## 2016-08-30 VITALS — BP 108/70 | HR 72 | Temp 97.9°F | Resp 18 | Ht 63.0 in | Wt 191.0 lb

## 2016-08-30 DIAGNOSIS — Z0000 Encounter for general adult medical examination without abnormal findings: Secondary | ICD-10-CM | POA: Diagnosis not present

## 2016-08-30 DIAGNOSIS — B351 Tinea unguium: Secondary | ICD-10-CM | POA: Diagnosis not present

## 2016-08-30 DIAGNOSIS — I10 Essential (primary) hypertension: Secondary | ICD-10-CM

## 2016-08-30 LAB — LIPID PANEL
CHOL/HDL RATIO: 4
Cholesterol: 174 mg/dL (ref 0–200)
HDL: 40.6 mg/dL (ref >39.00)
LDL Cholesterol: 104 mg/dL — ABNORMAL HIGH (ref 0–99)
NONHDL: 133.73
Triglycerides: 148 mg/dL (ref 0.0–149.0)
VLDL: 29.6 mg/dL (ref 0.0–40.0)

## 2016-08-30 LAB — CBC WITH DIFFERENTIAL/PLATELET
BASOS PCT: 1.2 % (ref 0.0–3.0)
Basophils Absolute: 0.1 10*3/uL (ref 0.0–0.1)
EOS PCT: 1.7 % (ref 0.0–5.0)
Eosinophils Absolute: 0.1 10*3/uL (ref 0.0–0.7)
HEMATOCRIT: 39 % (ref 36.0–46.0)
Hemoglobin: 12.9 g/dL (ref 12.0–15.0)
LYMPHS PCT: 30 % (ref 12.0–46.0)
Lymphs Abs: 1.8 10*3/uL (ref 0.7–4.0)
MCHC: 33.1 g/dL (ref 30.0–36.0)
MCV: 90.7 fl (ref 78.0–100.0)
Monocytes Absolute: 0.5 10*3/uL (ref 0.1–1.0)
Monocytes Relative: 7.6 % (ref 3.0–12.0)
Neutro Abs: 3.6 10*3/uL (ref 1.4–7.7)
Neutrophils Relative %: 59.5 % (ref 43.0–77.0)
Platelets: 295 10*3/uL (ref 150.0–400.0)
RBC: 4.3 Mil/uL (ref 3.87–5.11)
RDW: 15.2 % (ref 11.5–15.5)
WBC: 6 10*3/uL (ref 4.0–10.5)

## 2016-08-30 LAB — BASIC METABOLIC PANEL
BUN: 12 mg/dL (ref 6–23)
CALCIUM: 9.6 mg/dL (ref 8.4–10.5)
CHLORIDE: 102 meq/L (ref 96–112)
CO2: 35 mEq/L — ABNORMAL HIGH (ref 19–32)
Creatinine, Ser: 0.88 mg/dL (ref 0.40–1.20)
GFR: 67.44 mL/min (ref >60.00)
Glucose, Bld: 101 mg/dL — ABNORMAL HIGH (ref 70–99)
Potassium: 3.7 mEq/L (ref 3.5–5.1)
SODIUM: 141 meq/L (ref 135–145)

## 2016-08-30 LAB — HEPATIC FUNCTION PANEL
ALT: 25 U/L (ref 0–35)
AST: 20 U/L (ref 0–37)
Albumin: 4.1 g/dL (ref 3.5–5.2)
Alkaline Phosphatase: 106 U/L (ref 39–117)
BILIRUBIN TOTAL: 0.4 mg/dL (ref 0.2–1.2)
Bilirubin, Direct: 0.1 mg/dL (ref 0.0–0.3)
Total Protein: 6.4 g/dL (ref 6.0–8.3)

## 2016-08-30 LAB — TSH: TSH: 1.61 u[IU]/mL (ref 0.35–4.50)

## 2016-08-30 MED ORDER — CICLOPIROX 8 % EX SOLN: Freq: Every day | CUTANEOUS | 0 refills | Status: DC

## 2016-08-30 NOTE — Patient Instructions (Addendum)
Follow up in 6 months to recheck BP We'll notify you of your lab results and make any changes if needed Continue to work on healthy diet and regular exercise- you're doing great! Use the PenLac as directed for the nail fungus Call with any questions or concerns Happy 4th of July!!!   Continue doing brain stimulating activities (puzzles, reading, adult coloring books, staying active) to keep memory sharp.   Bring a copy of your advance directives to your next office visit.   DASH Eating Plan DASH stands for "Dietary Approaches to Stop Hypertension." The DASH eating plan is a healthy eating plan that has been shown to reduce high blood pressure (hypertension). It may also reduce your risk for type 2 diabetes, heart disease, and stroke. The DASH eating plan may also help with weight loss. What are tips for following this plan? General guidelines  Avoid eating more than 2,300 mg (milligrams) of salt (sodium) a day. If you have hypertension, you may need to reduce your sodium intake to 1,500 mg a day.  Limit alcohol intake to no more than 1 drink a day for nonpregnant women and 2 drinks a day for men. One drink equals 12 oz of beer, 5 oz of wine, or 1 oz of hard liquor.  Work with your health care provider to maintain a healthy body weight or to lose weight. Ask what an ideal weight is for you.  Get at least 30 minutes of exercise that causes your heart to beat faster (aerobic exercise) most days of the week. Activities may include walking, swimming, or biking.  Work with your health care provider or diet and nutrition specialist (dietitian) to adjust your eating plan to your individual calorie needs. Reading food labels  Check food labels for the amount of sodium per serving. Choose foods with less than 5 percent of the Daily Value of sodium. Generally, foods with less than 300 mg of sodium per serving fit into this eating plan.  To find whole grains, look for the word "whole" as the  first word in the ingredient list. Shopping  Buy products labeled as "low-sodium" or "no salt added."  Buy fresh foods. Avoid canned foods and premade or frozen meals. Cooking  Avoid adding salt when cooking. Use salt-free seasonings or herbs instead of table salt or sea salt. Check with your health care provider or pharmacist before using salt substitutes.  Do not fry foods. Cook foods using healthy methods such as baking, boiling, grilling, and broiling instead.  Cook with heart-healthy oils, such as olive, canola, soybean, or sunflower oil. Meal planning   Eat a balanced diet that includes: ? 5 or more servings of fruits and vegetables each day. At each meal, try to fill half of your plate with fruits and vegetables. ? Up to 6-8 servings of whole grains each day. ? Less than 6 oz of lean meat, poultry, or fish each day. A 3-oz serving of meat is about the same size as a deck of cards. One egg equals 1 oz. ? 2 servings of low-fat dairy each day. ? A serving of nuts, seeds, or beans 5 times each week. ? Heart-healthy fats. Healthy fats called Omega-3 fatty acids are found in foods such as flaxseeds and coldwater fish, like sardines, salmon, and mackerel.  Limit how much you eat of the following: ? Canned or prepackaged foods. ? Food that is high in trans fat, such as fried foods. ? Food that is high in saturated fat, such as  fatty meat. ? Sweets, desserts, sugary drinks, and other foods with added sugar. ? Full-fat dairy products.  Do not salt foods before eating.  Try to eat at least 2 vegetarian meals each week.  Eat more home-cooked food and less restaurant, buffet, and fast food.  When eating at a restaurant, ask that your food be prepared with less salt or no salt, if possible. What foods are recommended? The items listed may not be a complete list. Talk with your dietitian about what dietary choices are best for you. Grains Whole-grain or whole-wheat bread. Whole-grain  or whole-wheat pasta. Brown rice. [place]. Bulgur. Whole-grain and low-sodium cereals. Pita bread. Low-fat, low-sodium crackers. Whole-wheat flour tortillas. Vegetables Fresh or frozen vegetables (raw, steamed, roasted, or grilled). Low-sodium or reduced-sodium tomato and vegetable juice. Low-sodium or reduced-sodium tomato sauce and tomato paste. Low-sodium or reduced-sodium canned vegetables. Fruits All fresh, dried, or frozen fruit. Canned fruit in natural juice (without added sugar). Meat and other protein foods Skinless chicken or [place]. Ground chicken or [place]. Pork with fat trimmed off. Fish and seafood. Egg whites. Dried beans, peas, or lentils. Unsalted nuts, nut butters, and seeds. Unsalted canned beans. Lean cuts of beef with fat trimmed off. Low-sodium, lean deli meat. Dairy Low-fat (1%) or fat-free (skim) milk. Fat-free, low-fat, or reduced-fat cheeses. Nonfat, low-sodium ricotta or cottage cheese. Low-fat or nonfat yogurt. Low-fat, low-sodium cheese. Fats and oils Soft margarine without trans fats. Vegetable oil. Low-fat, reduced-fat, or light mayonnaise and salad dressings (reduced-sodium). Canola, safflower, olive, soybean, and sunflower oils. Avocado. Seasoning and other foods Herbs. Spices. Seasoning mixes without salt. Unsalted popcorn and pretzels. Fat-free sweets. What foods are not recommended? The items listed may not be a complete list. Talk with your dietitian about what dietary choices are best for you. Grains Baked goods made with fat, such as croissants, muffins, or some breads. Dry pasta or rice meal packs. Vegetables Creamed or fried vegetables. Vegetables in a cheese sauce. Regular canned vegetables (not low-sodium or reduced-sodium). Regular canned tomato sauce and paste (not low-sodium or reduced-sodium). Regular tomato and vegetable juice (not low-sodium or reduced-sodium). Angie Fava. Olives. Fruits Canned fruit in a light or heavy syrup. Fried fruit.  Fruit in cream or butter sauce. Meat and other protein foods Fatty cuts of meat. Ribs. Fried meat. Berniece Salines. Sausage. Bologna and other processed lunch meats. Salami. Fatback. Hotdogs. Bratwurst. Salted nuts and seeds. Canned beans with added salt. Canned or smoked fish. Whole eggs or egg yolks. Chicken or [place] with skin. Dairy Whole or 2% milk, cream, and half-and-half. Whole or full-fat cream cheese. Whole-fat or sweetened yogurt. Full-fat cheese. Nondairy creamers. Whipped toppings. Processed cheese and cheese spreads. Fats and oils Butter. Stick margarine. Lard. Shortening. Ghee. Bacon fat. Tropical oils, such as coconut, palm kernel, or palm oil. Seasoning and other foods Salted popcorn and pretzels. Onion salt, garlic salt, seasoned salt, table salt, and sea salt. Worcestershire sauce. Tartar sauce. Barbecue sauce. Teriyaki sauce. Soy sauce, including reduced-sodium. Steak sauce. Canned and packaged gravies. Fish sauce. Oyster sauce. Cocktail sauce. Horseradish that you find on the shelf. Ketchup. Mustard. Meat flavorings and tenderizers. Bouillon cubes. Hot sauce and Tabasco sauce. Premade or packaged marinades. Premade or packaged taco seasonings. Relishes. Regular salad dressings. Where to find more information:  National Heart, Lung, and [place]: https://wilson-eaton.com/  American Heart Association: www.heart.org Summary  The DASH eating plan is a healthy eating plan that has been shown to reduce high blood pressure (hypertension). It may also reduce your risk for  type 2 diabetes, heart disease, and stroke.  With the DASH eating plan, you should limit salt (sodium) intake to 2,300 mg a day. If you have hypertension, you may need to reduce your sodium intake to 1,500 mg a day.  When on the DASH eating plan, aim to eat more fresh fruits and vegetables, whole grains, lean proteins, low-fat dairy, and heart-healthy fats.  Work with your health care provider or diet and nutrition  specialist (dietitian) to adjust your eating plan to your individual calorie needs. This information is not intended to replace advice given to you by your health care provider. Make sure you discuss any questions you have with your health care provider. Document Released: 02/09/2011 Document Revised: 02/14/2016 Document Reviewed: 02/14/2016 Elsevier Interactive Patient Education  2017 [place] Maintenance, Female Adopting a healthy lifestyle and getting preventive care can go a long way to promote health and wellness. Talk with your health care provider about what schedule of regular examinations is right for you. This is a good chance for you to check in with your provider about disease prevention and staying healthy. In between checkups, there are plenty of things you can do on your own. Experts have done a lot of research about which lifestyle changes and preventive measures are most likely to keep you healthy. Ask your health care provider for more information. Weight and diet Eat a healthy diet  Be sure to include plenty of vegetables, fruits, low-fat dairy products, and lean protein.  Do not eat a lot of foods high in solid fats, added sugars, or salt.  Get regular exercise. This is one of the most important things you can do for your health. ? Most adults should exercise for at least 150 minutes each week. The exercise should increase your heart rate and make you sweat (moderate-intensity exercise). ? Most adults should also do strengthening exercises at least twice a week. This is in addition to the moderate-intensity exercise.  Maintain a healthy weight  Body mass index (BMI) is a measurement that can be used to identify possible weight problems. It estimates body fat based on height and weight. Your health care provider can help determine your BMI and help you achieve or maintain a healthy weight.  For females 68 years of age and older: ? A BMI below 18.5 is considered  underweight. ? A BMI of 18.5 to 24.9 is normal. ? A BMI of 25 to 29.9 is considered overweight. ? A BMI of 30 and above is considered obese.  Watch levels of cholesterol and blood lipids  You should start having your blood tested for lipids and cholesterol at 70 years of age, then have this test every 5 years.  You may need to have your cholesterol levels checked more often if: ? Your lipid or cholesterol levels are high. ? You are older than 70 years of age. ? You are at high risk for heart disease.  Cancer screening Lung Cancer  Lung cancer screening is recommended for adults 1-22 years old who are at high risk for lung cancer because of a history of smoking.  A yearly low-dose CT scan of the lungs is recommended for people who: ? Currently smoke. ? Have quit within the past 15 years. ? Have at least a 30-pack-year history of smoking. A pack year is smoking an average of one pack of cigarettes a day for 1 year.  Yearly screening should continue until it has been 15 years since you quit.  Yearly  screening should stop if you develop a health problem that would prevent you from having lung cancer treatment.  Breast Cancer  Practice breast self-awareness. This means understanding how your breasts normally appear and feel.  It also means doing regular breast self-exams. Let your health care provider know about any changes, no matter how small.  If you are in your 20s or 30s, you should have a clinical breast exam (CBE) by a health care provider every 1-3 years as part of a regular health exam.  If you are 80 or older, have a CBE every year. Also consider having a breast X-ray (mammogram) every year.  If you have a family history of breast cancer, talk to your health care provider about genetic screening.  If you are at high risk for breast cancer, talk to your health care provider about having an MRI and a mammogram every year.  Breast cancer gene (BRCA) assessment is  recommended for women who have family members with BRCA-related cancers. BRCA-related cancers include: ? Breast. ? Ovarian. ? Tubal. ? Peritoneal cancers.  Results of the assessment will determine the need for genetic counseling and BRCA1 and BRCA2 testing.  Cervical Cancer Your health care provider may recommend that you be screened regularly for cancer of the pelvic organs (ovaries, uterus, and vagina). This screening involves a pelvic examination, including checking for microscopic changes to the surface of your cervix (Pap test). You may be encouraged to have this screening done every 3 years, beginning at age 16.  For women ages 7-65, health care providers may recommend pelvic exams and Pap testing every 3 years, or they may recommend the Pap and pelvic exam, combined with testing for human papilloma virus (HPV), every 5 years. Some types of HPV increase your risk of cervical cancer. Testing for HPV may also be done on women of any age with unclear Pap test results.  Other health care providers may not recommend any screening for nonpregnant women who are considered low risk for pelvic cancer and who do not have symptoms. Ask your health care provider if a screening pelvic exam is right for you.  If you have had past treatment for cervical cancer or a condition that could lead to cancer, you need Pap tests and screening for cancer for at least 20 years after your treatment. If Pap tests have been discontinued, your risk factors (such as having a new sexual partner) need to be reassessed to determine if screening should resume. Some women have medical problems that increase the chance of getting cervical cancer. In these cases, your health care provider may recommend more frequent screening and Pap tests.  Colorectal Cancer  This type of cancer can be detected and often prevented.  Routine colorectal cancer screening usually begins at 70 years of age and continues through 70 years of  age.  Your health care provider may recommend screening at an earlier age if you have risk factors for colon cancer.  Your health care provider may also recommend using home test kits to check for hidden blood in the stool.  A small camera at the end of a tube can be used to examine your colon directly (sigmoidoscopy or colonoscopy). This is done to check for the earliest forms of colorectal cancer.  Routine screening usually begins at age 66.  Direct examination of the colon should be repeated every 5-10 years through 70 years of age. However, you may need to be screened more often if early forms of precancerous polyps  or small growths are found.  Skin Cancer  Check your skin from head to toe regularly.  Tell your health care provider about any new moles or changes in moles, especially if there is a change in a mole's shape or color.  Also tell your health care provider if you have a mole that is larger than the size of a pencil eraser.  Always use sunscreen. Apply sunscreen liberally and repeatedly throughout the day.  Protect yourself by wearing long sleeves, pants, a wide-brimmed hat, and sunglasses whenever you are outside.  Heart disease, diabetes, and high blood pressure  High blood pressure causes heart disease and increases the risk of stroke. High blood pressure is more likely to develop in: ? People who have blood pressure in the high end of the normal range (130-139/85-89 mm Hg). ? People who are overweight or obese. ? People who are African American.  If you are 60-55 years of age, have your blood pressure checked every 3-5 years. If you are 97 years of age or older, have your blood pressure checked every year. You should have your blood pressure measured twice-once when you are at a hospital or clinic, and once when you are not at a hospital or clinic. Record the average of the two measurements. To check your blood pressure when you are not at a hospital or clinic, you  can use: ? An automated blood pressure machine at a pharmacy. ? A home blood pressure monitor.  If you are between 44 years and 53 years old, ask your health care provider if you should take aspirin to prevent strokes.  Have regular diabetes screenings. This involves taking a blood sample to check your fasting blood sugar level. ? If you are at a normal weight and have a low risk for diabetes, have this test once every three years after 70 years of age. ? If you are overweight and have a high risk for diabetes, consider being tested at a younger age or more often. Preventing infection Hepatitis B  If you have a higher risk for hepatitis B, you should be screened for this virus. You are considered at high risk for hepatitis B if: ? You were born in a country where hepatitis B is common. Ask your health care provider which countries are considered high risk. ? Your parents were born in a high-risk country, and you have not been immunized against hepatitis B (hepatitis B vaccine). ? You have HIV or AIDS. ? You use needles to inject street drugs. ? You live with someone who has hepatitis B. ? You have had sex with someone who has hepatitis B. ? You get hemodialysis treatment. ? You take certain medicines for conditions, including cancer, organ transplantation, and autoimmune conditions.  Hepatitis C  Blood testing is recommended for: ? Everyone born from [birth year] through 1964. ? Anyone with known risk factors for hepatitis C.  Sexually transmitted infections (STIs)  You should be screened for sexually transmitted infections (STIs) including gonorrhea and chlamydia if: ? You are sexually active and are younger than 70 years of age. ? You are older than 70 years of age and your health care provider tells you that you are at risk for this type of infection. ? Your sexual activity has changed since you were last screened and you are at an increased risk for chlamydia or gonorrhea. Ask your  health care provider if you are at risk.  If you do not have HIV, but are at risk,  it may be recommended that you take a prescription medicine daily to prevent HIV infection. This is called pre-exposure prophylaxis (PrEP). You are considered at risk if: ? You are sexually active and do not regularly use condoms or know the HIV status of your partner(s). ? You take drugs by injection. ? You are sexually active with a partner who has HIV.  Talk with your health care provider about whether you are at high risk of being infected with HIV. If you choose to begin PrEP, you should first be tested for HIV. You should then be tested every 3 months for as long as you are taking PrEP. Pregnancy  If you are premenopausal and you may become pregnant, ask your health care provider about preconception counseling.  If you may become pregnant, take 400 to 800 micrograms (mcg) of folic acid every day.  If you want to prevent pregnancy, talk to your health care provider about birth control (contraception). Osteoporosis and menopause  Osteoporosis is a disease in which the bones lose minerals and strength with aging. This can result in serious bone fractures. Your risk for osteoporosis can be identified using a bone density scan.  If you are 4 years of age or older, or if you are at risk for osteoporosis and fractures, ask your health care provider if you should be screened.  Ask your health care provider whether you should take a calcium or vitamin D supplement to lower your risk for osteoporosis.  Menopause may have certain physical symptoms and risks.  Hormone replacement therapy may reduce some of these symptoms and risks. Talk to your health care provider about whether hormone replacement therapy is right for you. Follow these instructions at home:  Schedule regular health, dental, and eye exams.  Stay current with your immunizations.  Do not use any tobacco products including cigarettes, chewing  tobacco, or electronic cigarettes.  If you are pregnant, do not drink alcohol.  If you are breastfeeding, limit how much and how often you drink alcohol.  Limit alcohol intake to no more than 1 drink per day for nonpregnant women. One drink equals 12 ounces of beer, 5 ounces of wine, or 1 ounces of hard liquor.  Do not use street drugs.  Do not share needles.  Ask your health care provider for help if you need support or information about quitting drugs.  Tell your health care provider if you often feel depressed.  Tell your health care provider if you have ever been abused or do not feel safe at home. This information is not intended to replace advice given to you by your health care provider. Make sure you discuss any questions you have with your health care provider. Document Released: 09/05/2010 Document Revised: 07/29/2015 Document Reviewed: 11/24/2014 Elsevier Interactive Patient Education  [employer].

## 2016-08-30 NOTE — Progress Notes (Signed)
   Subjective:    Patient ID: Andrea Byrd, female    DOB: [birth year], 71 y.o.   MRN: [id number]  HPI HTN- chronic problem. On Metoprolol, Losartan HCTZ daily w/ good control.  Denies CP, SOB, HAs, visual changes, edema.  Pt is getting some regular exercise via walking.    Nail fungus- pt reports R sided toenail fungus (1st-3rd toes).  Pt has tried OTC nail products w/o relief.  Review of Systems For ROS see HPI     Objective:   Physical Exam  Constitutional: She is oriented to person, place, and time. She appears well-developed and well-nourished. No distress.  HENT:  Head: Normocephalic and atraumatic.  Eyes: Conjunctivae and EOM are normal. Pupils are equal, round, and reactive to light.  Neck: Normal range of motion. Neck supple. No thyromegaly present.  Cardiovascular: Normal rate, regular rhythm, normal heart sounds and intact distal pulses.   No murmur heard. Pulmonary/Chest: Effort normal and breath sounds normal. No respiratory distress.  Abdominal: Soft. She exhibits no distension. There is no tenderness.  Musculoskeletal: She exhibits no edema.  Lymphadenopathy:    She has no cervical adenopathy.  Neurological: She is alert and oriented to person, place, and time.  Skin: Skin is warm and dry.  Nail fungus of R 1st-3rd toe  Psychiatric: She has a normal mood and affect. Her behavior is normal.  Vitals reviewed.         Assessment & Plan:  Nail fungus- new.  Failed OTC tx.  Start Penlac.  Reviewed supportive care and red flags that should prompt return.  Pt expressed understanding and is in agreement w/ plan.

## 2016-08-30 NOTE — Progress Notes (Addendum)
Subjective:   Andrea Byrd is a 71 y.o. female who presents for an Initial Medicare Annual Wellness Visit.  Review of Systems    No ROS.  Medicare Wellness Visit. Additional risk factors are reflected in the social history.  Cardiac Risk Factors include: advanced age (>5men, >4 women);family history of premature cardiovascular disease;obesity (BMI >30kg/m2);hypertension   Sleep patterns: Sleeps 7 hours, feels rested.  Home Safety/Smoke Alarms: Feels safe in home. Smoke alarms in place.  Living environment; residence and Firearm Safety: Lives with husband in 1 story home, rail at steps.  Seat Belt Safety/Bike Helmet: Wears seat belt.   Counseling:   Eye Exam-Last exam 05/2016, Dr. Prudencio Burly. H/O glaucoma.  Dental-Last exam 03/2016, yearly. Dr. Lin Givens  Female:   Pap-2015      Mammo-03/02/2016, negative.        Dexa scan-02/19/2012. Will wait for mammogram.         [facility]S-Colonoscopy 02/09/2012, normal. Recall 10 years.       Objective:    Today's Vitals   08/30/16 0832  BP: 108/70  Pulse: 72  Temp: 97.9 F (36.6 C)  SpO2: 95%  Weight: 191 lb (86.6 kg)  Height: 5\' 3"  (1.6 m)  PainSc: 7    Body mass index is 33.83 kg/m.   Current Medications (verified) Outpatient Encounter Prescriptions as of 08/30/2016  Medication Sig  . acetaminophen (TYLENOL) 325 MG tablet Take 650 mg by mouth every 6 (six) hours as needed.  Marland Kitchen aspirin 81 MG tablet Take 81 mg by mouth daily.  . cyclobenzaprine (FLEXERIL) 10 MG tablet Take 10 mg by mouth at bedtime.   [name] Sodium (STOOL SOFTENER) 100 MG capsule Take 100 mg by mouth at bedtime.  . famotidine (PEPCID) 20 MG tablet Take 1 tablet (20 mg total) by mouth at bedtime. As needed for stomach acid  . ferrous sulfate 325 (65 FE) MG tablet Take one po TID  . gabapentin (NEURONTIN) 300 MG capsule Take 2 capsules (600 mg total) by mouth 2 (two) times daily.  Marland Kitchen latanoprost (XALATAN) 0.005 % ophthalmic solution Place 1 drop into both eyes  at bedtime.   Marland Kitchen losartan-hydrochlorothiazide (HYZAAR) 50-12.5 MG tablet TAKE 1 TABLET BY MOUTH EVERY DAY  . metoprolol (LOPRESSOR) 100 MG tablet Take 50 mg by mouth daily.  Marland Kitchen omeprazole (PRILOSEC) 40 MG capsule Take 1 capsule (40 mg total) by mouth 2 (two) times daily.  . pramipexole (MIRAPEX) 1 MG tablet Take 1 tablet (1 mg total) by mouth 2 (two) times daily.  . naproxen sodium (ANAPROX) 220 MG tablet Take 2 mg by mouth at bedtime.  . sucralfate (CARAFATE) 1 GM/10ML suspension Take 10 mLs (1 g total) by mouth 4 (four) times daily. (Patient not taking: Reported on 08/30/2016)   Facility-Administered Encounter Medications as of 08/30/2016  Medication  . 0.9 %  sodium chloride infusion    Allergies (verified) Nitrofurantoin; Propoxyphene n-acetaminophen; Dexilant [dexlansoprazole]; Nexium [esomeprazole magnesium]; Nortriptyline hcl; Hydrocodone-homatropine; and Morphine   History: Past Medical History:  Diagnosis Date  . Anemia   . Arthritis   . Cervical cancer ([place]) 1977  . Diverticulosis   . Endometriosis   . Esophageal stricture   . Gastritis   . GERD (gastroesophageal reflux disease)   . Glaucoma   . Herpes simplex type 1 infection   . Hiatal hernia   . History of colon polyps   . History of gallstones   . Hypertension   . IBS (irritable bowel syndrome)   . Insomnia   .  Post-operative nausea and vomiting   . Restless leg syndrome    Past Surgical History:  Procedure Laterality Date  . ABDOMINAL HYSTERECTOMY  1998  . APPENDECTOMY  1998  . CERVICAL CONE BIOPSY    . CHOLECYSTECTOMY  1980  . COLON RESECTION  2002  . COLON SURGERY  2003   removed 2 feet colon  . ESOPHAGEAL MANOMETRY N/A 04/24/2016   Procedure: ESOPHAGEAL MANOMETRY (EM);  Surgeon: Mauri Pole, MD;  Location: [facility];  Service: Endoscopy;  Laterality: N/A;  . HAND SURGERY     right/ thumb joints replaced both hands  . OOPHORECTOMY    . TUBAL LIGATION     Family History  Problem Relation Age  of Onset  . Diabetes Mother   . Stroke Mother        brother, sister, MGM  . Colon polyps Mother   . Heart disease Father   . Kidney disease Father   . Colon polyps Father   . Lung disease Father   . Kidney cancer Sister   . Stroke Sister   . Diabetes Sister   . Colon polyps Sister   . Celiac disease Other        niece  . Heart attack Paternal Grandfather   . Colon cancer Neg Hx   . Esophageal cancer Neg Hx   . Rectal cancer Neg Hx   . Stomach cancer Neg Hx    Social History   Occupational History  . Retired    Social History Main Topics  . Smoking status: Never Smoker  . Smokeless tobacco: Never Used  . Alcohol use No  . Drug use: No  . Sexual activity: Not on file    Tobacco Counseling Counseling given: Not Answered   Activities of Daily Living In your present state of health, do you have any difficulty performing the following activities: 08/30/2016  Hearing? N  Vision? N  Difficulty concentrating or making decisions? N  Walking or climbing stairs? N  Dressing or bathing? N  Doing errands, shopping? N  Preparing Food and eating ? N  Using the Toilet? N  In the past six months, have you accidently leaked urine? N  Do you have problems with loss of bowel control? N  Managing your Medications? N  Managing your Finances? N  Housekeeping or managing your Housekeeping? N  Some recent data might be hidden    Immunizations and Health Maintenance Immunization History  Administered Date(s) Administered  . Influenza,inj,Quad PF,36+ Mos 12/23/2014, 10/24/2015  . Pneumococcal Conjugate-13 09/09/2013  . Pneumococcal Polysaccharide-23 12/20/2011  . Tdap 07/28/2005   There are no preventive care reminders to display for this patient.  Patient Care Team: [name], MD as PCP - General (Family Medicine) Mauri Pole, MD as Consulting Physician (Gastroenterology) Penni Bombard, MD as Consulting Physician (Neurology) Harriett Sine, MD as  Consulting Physician (Dermatology) Pa, [place] (Specialist)  Indicate any recent Medical Services you may have received from other than [facility] providers in the past year (date may be approximate).     Assessment:   This is a routine wellness examination for Andrea Byrd. Physical assessment deferred to PCP.   Hearing/Vision screen Hearing Screening Comments: Able to hear conversational tones w/o difficulty. No issues reported.   Vision Screening Comments: Cataract surgery.   Dietary issues and exercise activities discussed: Current Exercise Habits: Home exercise routine, Type of exercise: walking, Time (Minutes): 30, Frequency (Times/Week): 7, Weekly Exercise (Minutes/Week): 210, Exercise limited by: None identified  Diet (meal preparation, eat out, water intake, caffeinated beverages, dairy products, fruits and vegetables): Drinks green tea, water and soda.   Breakfast: Skips Lunch: sandwich Dinner: protein and vegetables.   Discussed heart healthy diet, not skipping meals and increasing activity.   Goals    . Weight (lb) < 180 lb (81.6 kg)          Lose weight by increasing activity.       Depression Screen PHQ 2/9 Scores 08/30/2016 02/23/2016  PHQ - 2 Score 0 0  PHQ- 9 Score - 0    Fall Risk Fall Risk  08/30/2016 08/29/2016 02/23/2016  Falls in the past year? No No Yes  Number falls in past yr: - - 1  Injury with Fall? - - No    Cognitive Function:       Ad8 score reviewed for issues:  Issues making decisions: no  Less interest in hobbies / activities: no  Repeats questions, stories (family complaining): no  Trouble using ordinary gadgets (microwave, computer, phone): no  Forgets the month or year: no  Mismanaging finances: no  Remembering appts: no  Daily problems with thinking and/or memory: no Ad8 score is=0     Screening Tests Health Maintenance  Topic Date Due  . TETANUS/TDAP  12/04/2016 (Originally 07/29/2015)  . Hepatitis C  Screening  12/04/2016 (Originally 05-Sep-1945)  . INFLUENZA VACCINE  10/04/2016  . MAMMOGRAM  02/28/2018  . COLONOSCOPY  02/08/2022  . DEXA SCAN  Completed  . PNA vac Low Risk Adult  Completed      Plan:    Continue doing brain stimulating activities (puzzles, reading, adult coloring books, staying active) to keep memory sharp.   Bring a copy of your advance directives to your next office visit.   I have personally reviewed and noted the following in the patient's chart:   . Medical and social history . Use of alcohol, tobacco or illicit drugs  . Current medications and supplements . Functional ability and status . Nutritional status . Physical activity . Advanced directives . List of other physicians . Hospitalizations, surgeries, and ER visits in previous 12 months . Vitals . Screenings to include cognitive, depression, and falls . Referrals and appointments  In addition, I have reviewed and discussed with patient certain preventive protocols, quality metrics, and best practice recommendations. A written personalized care plan for preventive services as well as general preventive health recommendations were provided to patient.     Gerilyn Nestle, RN   08/30/2016    Reviewed documentation and agree w/ above.  [name], MD

## 2016-08-30 NOTE — Assessment & Plan Note (Signed)
Chronic problem.  Well controlled today.  Asymptomatic.  Encouraged healthy diet and regular exercise.  No anticipated med changes.  Will follow.

## 2016-09-14 DIAGNOSIS — M5432 Sciatica, left side: Secondary | ICD-10-CM | POA: Diagnosis not present

## 2016-09-14 DIAGNOSIS — M9903 Segmental and somatic dysfunction of lumbar region: Secondary | ICD-10-CM | POA: Diagnosis not present

## 2016-09-14 DIAGNOSIS — M5431 Sciatica, right side: Secondary | ICD-10-CM | POA: Diagnosis not present

## 2016-09-18 ENCOUNTER — Other Ambulatory Visit: Payer: Self-pay | Admitting: Family Medicine

## 2016-09-19 ENCOUNTER — Telehealth: Payer: Self-pay | Admitting: General Practice

## 2016-09-19 NOTE — Telephone Encounter (Signed)
received a PA for cyclobenzaprine. However pt has medicare. Isn't tizanidine the preferred medication? Please advise if ok to fill?

## 2016-09-20 MED ORDER — TIZANIDINE HCL 4 MG PO TABS: 4.0000 mg | ORAL_TABLET | Freq: Four times a day (QID) | ORAL | 0 refills | Status: DC | PRN

## 2016-09-20 NOTE — Telephone Encounter (Signed)
[place] for Tizanidine 4mg  TID prn, #30, no refills

## 2016-09-20 NOTE — Telephone Encounter (Signed)
Called Andrea Byrd and advised of insurance and PCP recommendations. Andrea Byrd says she thinks that the tizanidine causes her very vivid/bad dreams. She requested I send in only 5 tablets so that she can see how they work.   Advised Andrea Byrd that the 5 tablets would probably be the same cost as 30 but she was insistent on only sending in 5.

## 2016-10-10 ENCOUNTER — Encounter (INDEPENDENT_AMBULATORY_CARE_PROVIDER_SITE_OTHER): Payer: Self-pay

## 2016-10-10 ENCOUNTER — Ambulatory Visit (INDEPENDENT_AMBULATORY_CARE_PROVIDER_SITE_OTHER): Payer: Medicare Other | Admitting: Gastroenterology

## 2016-10-10 ENCOUNTER — Encounter: Payer: Self-pay | Admitting: Gastroenterology

## 2016-10-10 VITALS — BP 96/70 | HR 92 | Ht 63.0 in | Wt 188.4 lb

## 2016-10-10 DIAGNOSIS — R1310 Dysphagia, unspecified: Secondary | ICD-10-CM

## 2016-10-10 DIAGNOSIS — K219 Gastro-esophageal reflux disease without esophagitis: Secondary | ICD-10-CM | POA: Diagnosis not present

## 2016-10-10 DIAGNOSIS — K5900 Constipation, unspecified: Secondary | ICD-10-CM

## 2016-10-10 DIAGNOSIS — K589 Irritable bowel syndrome without diarrhea: Secondary | ICD-10-CM

## 2016-10-10 DIAGNOSIS — K2900 Acute gastritis without bleeding: Secondary | ICD-10-CM

## 2016-10-10 MED ORDER — AMBULATORY NON FORMULARY MEDICATION: Status: DC

## 2016-10-10 MED ORDER — BENEFIBER PO POWD: ORAL | 0 refills | Status: DC

## 2016-10-10 NOTE — Patient Instructions (Signed)
Please purchase the following medications over the counter and take as directed: Benefiber take 1 tablespoon three times a day FDgard take 1 capsule three times a day as needed  We have referred you to a dietician.  You should hear from them to schedule an apointment. If you have not heard from them within 2 weeks please let us know.    If you are age 70 or older, your body mass index should be between 23-30. Your Body mass index is 33.37 kg/m. If this is out of the aforementioned range listed, please consider follow up with your Primary Care Provider.  If you are age 11 or younger, your body mass index should be between 19-25. Your Body mass index is 33.37 kg/m. If this is out of the aformentioned range listed, please consider follow up with your Primary Care Provider.  '

## 2016-10-10 NOTE — Progress Notes (Signed)
Andrea Byrd    [id number]    27-May-1945  Primary Care Physician:Tabori, Aundra Millet, MD  Referring Physician: [name], MD [address] [place], [place] [address]  Chief complaint:  GERD, dysphagia  HPI: 71 year old female with history of chronic GERD, hiatal hernia here for follow-up visit. EGD in December 2017 and June 2018 with evidence of erosive esophagitis and nonobstructive Schatzki's ring that was dilated to 20 mm, hiatal hernia. Patient did not have any improvement of dysphagia status post esophageal dilation in June 2018. Patient has symptoms of dysphagia and regurgitation about 3 or 4 times a week. Esophageal manometry suggestive of hypercontractile esophagus .She did not tolerate nortriptyline. Her BP usually runs low. GERD symptoms worse when she eats high-fiber diet, vegetables like broccoli or salads.  The medical history includes restless leg syndrome, chronic back pain on gabapentin, pramipexole and Flexeril   Outpatient Encounter Prescriptions as of 10/10/2016  Medication Sig  . acetaminophen (TYLENOL) 325 MG tablet Take 650 mg by mouth every 6 (six) hours as needed.  Marland Kitchen aspirin 81 MG tablet Take 81 mg by mouth daily.  . ciclopirox (PENLAC) 8 % solution Apply topically at bedtime. Apply over nail and surrounding skin daily over previous coat. After 7 days, remove with alcohol and repeat  . cyclobenzaprine (FLEXERIL) 10 MG tablet Take 10 mg by mouth at bedtime.  Andrea Byrd Sodium (STOOL SOFTENER) 100 MG capsule Take 100 mg by mouth at bedtime.  . famotidine (PEPCID) 20 MG tablet Take 1 tablet (20 mg total) by mouth at bedtime. As needed for stomach acid  . ferrous sulfate 325 (65 FE) MG tablet Take one po TID  . gabapentin (NEURONTIN) 300 MG capsule Take 2 capsules (600 mg total) by mouth 2 (two) times daily.  Marland Kitchen latanoprost (XALATAN) 0.005 % ophthalmic solution Place 1 drop into both eyes at bedtime.   Marland Kitchen losartan-hydrochlorothiazide (HYZAAR)  50-12.5 MG tablet TAKE 1 TABLET BY MOUTH EVERY DAY  . metoprolol (LOPRESSOR) 100 MG tablet Take 50 mg by mouth daily.  . metoprolol succinate (TOPROL-XL) 100 MG 24 hr tablet TAKE 1 TABLET BY MOUTH EVERY DAY  . omeprazole (PRILOSEC) 40 MG capsule Take 1 capsule (40 mg total) by mouth 2 (two) times daily.  . pramipexole (MIRAPEX) 1 MG tablet Take 1 tablet (1 mg total) by mouth 2 (two) times daily.  . sucralfate (CARAFATE) 1 GM/10ML suspension Take 10 mLs (1 g total) by mouth 4 (four) times daily. (Patient not taking: Reported on 08/30/2016)  . [DISCONTINUED] naproxen sodium (ANAPROX) 220 MG tablet Take 2 mg by mouth at bedtime.  . [DISCONTINUED] tiZANidine (ZANAFLEX) 4 MG tablet Take 1 tablet (4 mg total) by mouth every 6 (six) hours as needed for muscle spasms.   Facility-Administered Encounter Medications as of 10/10/2016  Medication  . 0.9 %  sodium chloride infusion    Allergies as of 10/10/2016 - Review Complete 08/30/2016  Allergen Reaction Noted  . Nitrofurantoin  02/04/2009  . Propoxyphene n-acetaminophen  02/04/2009  . Dexilant [dexlansoprazole] Nausea And Vomiting 08/29/2016  . Nexium [esomeprazole magnesium] Nausea And Vomiting 08/29/2016  . Nortriptyline hcl Nausea And Vomiting 08/29/2016  . Hydrocodone-homatropine Itching and Rash 09/01/2015  . Morphine Itching and Rash     Past Medical History:  Diagnosis Date  . Anemia   . Arthritis   . Cervical cancer ([place]) 1977  . Diverticulosis   . Endometriosis   . Esophageal stricture   .  Gastritis   . GERD (gastroesophageal reflux disease)   . Glaucoma   . Herpes simplex type 1 infection   . Hiatal hernia   . History of colon polyps   . History of gallstones   . Hypertension   . IBS (irritable bowel syndrome)   . Insomnia   . Post-operative nausea and vomiting   . Restless leg syndrome     Past Surgical History:  Procedure Laterality Date  . ABDOMINAL HYSTERECTOMY  1998  . APPENDECTOMY  1998  . CERVICAL CONE BIOPSY     . CHOLECYSTECTOMY  1980  . COLON RESECTION  2002  . COLON SURGERY  2003   removed 2 feet colon  . ESOPHAGEAL MANOMETRY N/A 04/24/2016   Procedure: ESOPHAGEAL MANOMETRY (EM);  Surgeon: Mauri Pole, MD;  Location: [facility];  Service: Endoscopy;  Laterality: N/A;  . HAND SURGERY     right/ thumb joints replaced both hands  . OOPHORECTOMY    . TUBAL LIGATION      Family History  Problem Relation Age of Onset  . Diabetes Mother   . Stroke Mother        brother, sister, MGM  . Colon polyps Mother   . Heart disease Father   . Kidney disease Father   . Colon polyps Father   . Lung disease Father   . Kidney cancer Sister   . Stroke Sister   . Diabetes Sister   . Colon polyps Sister   . Celiac disease Other        niece  . Heart attack Paternal Grandfather   . Colon cancer Neg Hx   . Esophageal cancer Neg Hx   . Rectal cancer Neg Hx   . Stomach cancer Neg Hx     Social History   Social History  . Marital status: Married    Spouse name: N/A  . Number of children: 1  . Years of education: N/A   Occupational History  . Retired    Social History Main Topics  . Smoking status: Never Smoker  . Smokeless tobacco: Never Used  . Alcohol use No  . Drug use: No  . Sexual activity: Not on file   Other Topics Concern  . Not on file   Social History Narrative   Lives with husband floyd   Drinks maybe 4 sodas a week      Review of systems: Review of Systems  Constitutional: Negative for fever and chills.  HENT: Negative.   Eyes: Negative for blurred vision.  Respiratory: Negative for cough, shortness of breath and wheezing.   Cardiovascular: Negative for chest pain and palpitations.  Gastrointestinal: as per HPI Genitourinary: Negative for dysuria, urgency, frequency and hematuria.  Musculoskeletal: Positive for myalgias, back pain and joint pain. Swelling of L ankle, has a ligament tear Skin: Negative for itching and rash.  Neurological: Negative for  dizziness, tremors, focal weakness, seizures and loss of consciousness.  Endo/Heme/Allergies: Positive for seasonal allergies.  Psychiatric/Behavioral: Negative for depression, suicidal ideas and hallucinations.  All other systems reviewed and are negative.   Physical Exam: Vitals:   10/10/16 1413  BP: 96/70  Pulse: 92   Body mass index is 33.37 kg/m. Gen:      No acute distress HEENT:  EOMI, sclera anicteric Neck:     No masses; no thyromegaly Lungs:    Clear to auscultation bilaterally; normal respiratory effort CV:         Regular rate and rhythm; no murmurs Abd:      +  bowel sounds; soft, non-tender; no palpable masses, no distension Ext:    No edema; adequate peripheral perfusion Skin:      Warm and dry; no rash Neuro: alert and oriented x 3 Psych: normal mood and affect  Data Reviewed:  Reviewed labs, radiology imaging, old records and pertinent past GI work up   Assessment and Plan/Recommendations:  71 year old female with history of hiatal hernia, chronic GERD with erosive esophagitis and gastritis here for follow-up visit Chronic intermittent dysphagia secondary to esophageal dysmotility hypercontractile esophagus Could be secondary to medications: Flexeril, gabapentin, pramipexole but given chronic back pain, restless leg syndrome and lumbar radiculopathy she will benefit with continued therapy and her dysphagia/spasm episodes are intermittent and not severe. Low baseline blood pressure, unlikely to tolerate calcium channel blocker for esophageal spasms  Gastritis: H. pylori negative Advised patient to minimize NSAID's  GERD with erosive esophagitis: Antireflux measures and continue PPI We'll refer to dietitian to discuss GERD diet and also help with weight loss  Dyspepsia: FD Gard and capsule 3 times a day as needed  Intermittent constipation with irritable bowel syndrome: Benefiber 1 tablespoon 3 times a day with meals  Return in 6-12 months  25 minutes  was spent face-to-face with the patient. Greater than 50% of the time used for counseling as well as treatment plan and follow-up. She had multiple questions which were answered to her satisfaction  K. Denzil Magnuson , MD [phone] Mon-Fri 8a-5p [phone] after 5p, weekends, holidays  CC: [name], MD

## 2016-10-25 DIAGNOSIS — S86312D Strain of muscle(s) and tendon(s) of peroneal muscle group at lower leg level, left leg, subsequent encounter: Secondary | ICD-10-CM | POA: Diagnosis not present

## 2016-10-25 DIAGNOSIS — S93492D Sprain of other ligament of left ankle, subsequent encounter: Secondary | ICD-10-CM | POA: Diagnosis not present

## 2016-11-14 DIAGNOSIS — Z23 Encounter for immunization: Secondary | ICD-10-CM | POA: Diagnosis not present

## 2016-11-27 DIAGNOSIS — H401232 Low-tension glaucoma, bilateral, moderate stage: Secondary | ICD-10-CM | POA: Diagnosis not present

## 2016-12-04 ENCOUNTER — Telehealth: Payer: Self-pay | Admitting: Gastroenterology

## 2016-12-04 NOTE — Telephone Encounter (Signed)
Please advise patient to drinks plenty of fluids/pedialyte to [facility] adequate hydration. If doesn't improve will need to check BMP and GI pathogen panel.

## 2016-12-04 NOTE — Telephone Encounter (Signed)
Started having diarrhea 3 days ago. Calls today stating she may feel slightly better, but the diarrhea continues. She is slightly nauseous. She is avoiding solids "because I know what will happen." stools are yellow watery and very urgent. She has diarrhea with any PO but more acutely with solid food. Unable to come to the lab today. Please advise.

## 2016-12-04 NOTE — Telephone Encounter (Signed)
Discussed with the patient. She will push fluids and follow up with her progress tomorrow.

## 2016-12-05 NOTE — Telephone Encounter (Signed)
Patient calls and says she is so much better.

## 2017-01-01 DIAGNOSIS — M25471 Effusion, right ankle: Secondary | ICD-10-CM | POA: Diagnosis not present

## 2017-01-01 DIAGNOSIS — M25571 Pain in right ankle and joints of right foot: Secondary | ICD-10-CM | POA: Diagnosis not present

## 2017-01-01 DIAGNOSIS — M7671 Peroneal tendinitis, right leg: Secondary | ICD-10-CM | POA: Diagnosis not present

## 2017-01-01 DIAGNOSIS — M7672 Peroneal tendinitis, left leg: Secondary | ICD-10-CM | POA: Diagnosis not present

## 2017-01-04 ENCOUNTER — Ambulatory Visit (INDEPENDENT_AMBULATORY_CARE_PROVIDER_SITE_OTHER): Payer: Medicare Other | Admitting: Family Medicine

## 2017-01-04 ENCOUNTER — Encounter: Payer: Self-pay | Admitting: Family Medicine

## 2017-01-04 VITALS — BP 130/81 | HR 76 | Temp 98.1°F | Resp 16 | Ht 63.0 in | Wt 190.4 lb

## 2017-01-04 DIAGNOSIS — R600 Localized edema: Secondary | ICD-10-CM

## 2017-01-04 LAB — CBC WITH DIFFERENTIAL/PLATELET
BASOS ABS: 0.1 10*3/uL (ref 0.0–0.1)
Basophils Relative: 0.8 % (ref 0.0–3.0)
EOS ABS: 0.1 10*3/uL (ref 0.0–0.7)
Eosinophils Relative: 1.7 % (ref 0.0–5.0)
HCT: 40.1 % (ref 36.0–46.0)
Hemoglobin: 13 g/dL (ref 12.0–15.0)
LYMPHS ABS: 2 10*3/uL (ref 0.7–4.0)
Lymphocytes Relative: 25.6 % (ref 12.0–46.0)
MCHC: 32.5 g/dL (ref 30.0–36.0)
MCV: 90 fl (ref 78.0–100.0)
MONO ABS: 0.5 10*3/uL (ref 0.1–1.0)
MONOS PCT: 5.9 % (ref 3.0–12.0)
NEUTROS ABS: 5.3 10*3/uL (ref 1.4–7.7)
NEUTROS PCT: 66 % (ref 43.0–77.0)
PLATELETS: 314 10*3/uL (ref 150.0–400.0)
RBC: 4.45 Mil/uL (ref 3.87–5.11)
RDW: 14.7 % (ref 11.5–15.5)
WBC: 8 10*3/uL (ref 4.0–10.5)

## 2017-01-04 LAB — BASIC METABOLIC PANEL
BUN: 18 mg/dL (ref 6–23)
CHLORIDE: 102 meq/L (ref 96–112)
CO2: 33 meq/L — AB (ref 19–32)
Calcium: 9.6 mg/dL (ref 8.4–10.5)
Creatinine, Ser: 0.74 mg/dL (ref 0.40–1.20)
GFR: 82.29 mL/min (ref >60.00)
GLUCOSE: 94 mg/dL (ref 70–99)
POTASSIUM: 3.9 meq/L (ref 3.5–5.1)
SODIUM: 140 meq/L (ref 135–145)

## 2017-01-04 LAB — HEPATIC FUNCTION PANEL
ALK PHOS: 106 U/L (ref 39–117)
ALT: 24 U/L (ref 0–35)
AST: 21 U/L (ref 0–37)
Albumin: 3.9 g/dL (ref 3.5–5.2)
BILIRUBIN DIRECT: 0.1 mg/dL (ref 0.0–0.3)
TOTAL PROTEIN: 6.4 g/dL (ref 6.0–8.3)
Total Bilirubin: 0.3 mg/dL (ref 0.2–1.2)

## 2017-01-04 LAB — TSH: TSH: 1.95 u[IU]/mL (ref 0.35–4.50)

## 2017-01-04 NOTE — Patient Instructions (Signed)
Follow up as needed or as scheduled We'll notify you of your lab results and make any changes if needed We'll call you with your appt for the ECHO (ultrasound of the heart) Drink plenty of fluids Elevate your feet as you are able Consider wearing compression socks/hose to help w/ swelling If the labs look good, we can start a low dose Lasix to improve the swelling Call with any questions or concerns Hang in there!

## 2017-01-04 NOTE — Progress Notes (Signed)
   Subjective:    Patient ID: Andrea Byrd, female    DOB: 04-10-1945, 71 y.o.   MRN: [id number]  HPI Edema- pt was told yesterday that she has a torn ligament in L ankle but that there was 'nothing wrong' w/ R ankle.  Pt reports both ankles are swelling.  She is going to PT for the torn ligament.  Pt reports ankles will swell daily.  Improves w/ elevation, worsens w/ activity.  No CP or SOB.  Pt doesn't notice any association w/ heat or diet.  No swelling of hands.  Is on HCTZ for BP control.     Review of Systems For ROS see HPI     Objective:   Physical Exam  Constitutional: She is oriented to person, place, and time. She appears well-developed and well-nourished. No distress.  HENT:  Head: Normocephalic and atraumatic.  Eyes: Pupils are equal, round, and reactive to light. Conjunctivae and EOM are normal.  Neck: Normal range of motion. Neck supple. No thyromegaly present.  Cardiovascular: Normal rate, regular rhythm, normal heart sounds and intact distal pulses.   No murmur heard. Pulmonary/Chest: Effort normal and breath sounds normal. No respiratory distress.  Abdominal: Soft. She exhibits no distension. There is no tenderness.  Musculoskeletal: She exhibits edema (trace edema of LEs bilaterally).  Lymphadenopathy:    She has no cervical adenopathy.  Neurological: She is alert and oriented to person, place, and time.  Skin: Skin is warm and dry.  Psychiatric: She has a normal mood and affect. Her behavior is normal.  Vitals reviewed.         Assessment & Plan:  Localized edema- new.  Pt has trace to 1+ edema of feet bilaterally.  Swelling does improve w/ elevation.  Denies sxs of systemic volume overload- no swelling of hands, SOB, cough or wheeze.  Check labs to r/o metabolic cause of swelling such as thyroid abnormality or anemia.  Get ECHO to assess for CHF.  Reviewed need to limit salt intake and increase water.  If labs WNL will add Lasix 20mg  daily.  Reviewed supportive  care and red flags that should prompt return.  Pt expressed understanding and is in agreement w/ plan.

## 2017-01-05 ENCOUNTER — Other Ambulatory Visit: Payer: Self-pay | Admitting: General Practice

## 2017-01-05 DIAGNOSIS — I10 Essential (primary) hypertension: Secondary | ICD-10-CM

## 2017-01-05 MED ORDER — FUROSEMIDE 20 MG PO TABS: 20.0000 mg | ORAL_TABLET | Freq: Every day | ORAL | 3 refills | Status: DC

## 2017-01-12 DIAGNOSIS — M7671 Peroneal tendinitis, right leg: Secondary | ICD-10-CM | POA: Diagnosis not present

## 2017-01-15 ENCOUNTER — Other Ambulatory Visit: Payer: Self-pay

## 2017-01-15 ENCOUNTER — Ambulatory Visit (HOSPITAL_COMMUNITY): Payer: Medicare Other | Attending: Cardiology

## 2017-01-15 ENCOUNTER — Other Ambulatory Visit: Payer: Self-pay | Admitting: Family Medicine

## 2017-01-15 DIAGNOSIS — I071 Rheumatic tricuspid insufficiency: Secondary | ICD-10-CM | POA: Diagnosis not present

## 2017-01-15 DIAGNOSIS — I5030 Unspecified diastolic (congestive) heart failure: Secondary | ICD-10-CM | POA: Insufficient documentation

## 2017-01-15 DIAGNOSIS — Z139 Encounter for screening, unspecified: Secondary | ICD-10-CM

## 2017-01-15 DIAGNOSIS — I420 Dilated cardiomyopathy: Secondary | ICD-10-CM | POA: Insufficient documentation

## 2017-01-15 DIAGNOSIS — R600 Localized edema: Secondary | ICD-10-CM

## 2017-01-16 DIAGNOSIS — M7671 Peroneal tendinitis, right leg: Secondary | ICD-10-CM | POA: Diagnosis not present

## 2017-01-18 DIAGNOSIS — M7671 Peroneal tendinitis, right leg: Secondary | ICD-10-CM | POA: Diagnosis not present

## 2017-01-19 ENCOUNTER — Telehealth: Payer: Self-pay | Admitting: General Practice

## 2017-01-19 MED ORDER — CYCLOBENZAPRINE HCL 10 MG PO TABS: 10.0000 mg | ORAL_TABLET | Freq: Every day | ORAL | 3 refills | Status: DC

## 2017-01-19 NOTE — Telephone Encounter (Signed)
There is a phone note from earlier this year that she had side effects on Tizanidine.  If she needs a refill on Flexeril, it will need a prior authorization and we need to know exactly what she's taking it for (in hopes of getting it approved)

## 2017-01-19 NOTE — Telephone Encounter (Signed)
Please advise. You saw pt about ankle pain. However you did not send in any flexeril. It is listed on her med list as being prescribed from a historical provider. Can I disregard as this is not an active prescription and pt would need to be on tizanidine due to age?

## 2017-01-19 NOTE — Telephone Encounter (Signed)
PA approved. Prescription filled to pharmacy. Pt made aware.

## 2017-01-19 NOTE — Addendum Note (Signed)
Addended by: Davis Gourd on: 01/19/2017 04:13 PM   Modules accepted: Orders

## 2017-01-19 NOTE — Telephone Encounter (Signed)
Noted. PA began today in covermymeds

## 2017-01-22 ENCOUNTER — Other Ambulatory Visit (INDEPENDENT_AMBULATORY_CARE_PROVIDER_SITE_OTHER): Payer: Medicare Other

## 2017-01-22 DIAGNOSIS — I10 Essential (primary) hypertension: Secondary | ICD-10-CM

## 2017-01-22 LAB — BASIC METABOLIC PANEL
BUN: 11 mg/dL (ref 6–23)
CALCIUM: 9.7 mg/dL (ref 8.4–10.5)
CO2: 34 mEq/L — ABNORMAL HIGH (ref 19–32)
Chloride: 101 mEq/L (ref 96–112)
Creatinine, Ser: 0.88 mg/dL (ref 0.40–1.20)
GFR: 67.37 mL/min (ref >60.00)
GLUCOSE: 99 mg/dL (ref 70–99)
Potassium: 3.5 mEq/L (ref 3.5–5.1)
Sodium: 142 mEq/L (ref 135–145)

## 2017-01-23 ENCOUNTER — Encounter: Payer: Self-pay | Admitting: General Practice

## 2017-01-23 DIAGNOSIS — M7671 Peroneal tendinitis, right leg: Secondary | ICD-10-CM | POA: Diagnosis not present

## 2017-01-30 DIAGNOSIS — M7671 Peroneal tendinitis, right leg: Secondary | ICD-10-CM | POA: Diagnosis not present

## 2017-02-01 DIAGNOSIS — M7671 Peroneal tendinitis, right leg: Secondary | ICD-10-CM | POA: Diagnosis not present

## 2017-02-06 DIAGNOSIS — M7671 Peroneal tendinitis, right leg: Secondary | ICD-10-CM | POA: Diagnosis not present

## 2017-02-08 DIAGNOSIS — M7671 Peroneal tendinitis, right leg: Secondary | ICD-10-CM | POA: Diagnosis not present

## 2017-02-14 DIAGNOSIS — M7671 Peroneal tendinitis, right leg: Secondary | ICD-10-CM | POA: Diagnosis not present

## 2017-02-16 DIAGNOSIS — M7671 Peroneal tendinitis, right leg: Secondary | ICD-10-CM | POA: Diagnosis not present

## 2017-02-20 DIAGNOSIS — M7671 Peroneal tendinitis, right leg: Secondary | ICD-10-CM | POA: Diagnosis not present

## 2017-02-22 DIAGNOSIS — M7671 Peroneal tendinitis, right leg: Secondary | ICD-10-CM | POA: Diagnosis not present

## 2017-03-01 ENCOUNTER — Ambulatory Visit
Admission: RE | Admit: 2017-03-01 | Discharge: 2017-03-01 | Disposition: A | Discharge status: Home / Self Care | Payer: Medicare Other | Source: Ambulatory Visit | Attending: Family Medicine | Admitting: Family Medicine

## 2017-03-01 DIAGNOSIS — Z1231 Encounter for screening mammogram for malignant neoplasm of breast: Secondary | ICD-10-CM | POA: Diagnosis not present

## 2017-03-01 DIAGNOSIS — Z139 Encounter for screening, unspecified: Secondary | ICD-10-CM

## 2017-03-19 ENCOUNTER — Other Ambulatory Visit: Payer: Self-pay | Admitting: Family Medicine

## 2017-03-20 ENCOUNTER — Encounter: Payer: Self-pay | Admitting: Family Medicine

## 2017-03-20 ENCOUNTER — Ambulatory Visit (INDEPENDENT_AMBULATORY_CARE_PROVIDER_SITE_OTHER): Payer: Medicare Other | Admitting: Family Medicine

## 2017-03-20 VITALS — BP 126/74 | HR 67 | Temp 98.0°F | Ht 63.0 in | Wt 192.2 lb

## 2017-03-20 DIAGNOSIS — B9689 Other specified bacterial agents as the cause of diseases classified elsewhere: Secondary | ICD-10-CM

## 2017-03-20 DIAGNOSIS — J208 Acute bronchitis due to other specified organisms: Secondary | ICD-10-CM | POA: Diagnosis not present

## 2017-03-20 MED ORDER — AZITHROMYCIN 250 MG PO TABS: ORAL_TABLET | ORAL | 0 refills | Status: DC

## 2017-03-20 MED ORDER — PROMETHAZINE-DM 6.25-15 MG/5ML PO SYRP: 5.0000 mL | ORAL_SOLUTION | Freq: Four times a day (QID) | ORAL | 0 refills | Status: DC | PRN

## 2017-03-20 NOTE — Progress Notes (Signed)
Subjective  CC:  Chief Complaint  Patient presents with  . Cough    x several weeks, expectorating yellow/green sputum  . Nasal Congestion    clear drainage    HPI: SUBJECTIVE:  Andrea Byrd is a 72 y.o. female who complains of congestion, nasal blockage, post nasal drip, cough described as barky, harsh and productive and denies sinus, high fevers, SOB, chest pain or significant GI symptoms. Symptoms have been present for 2 weeks and are worsening. She denies a history of anorexia, dizziness, vomiting and wheezing. She denies a history of asthma or COPD. Patient does not smoke cigarettes.  I reviewed the patients updated PMH, FH, and SocHx.  Social History: Patient  reports that  has never smoked. she has never used smokeless tobacco. She reports that she does not drink alcohol or use drugs.  Patient Active Problem List   Diagnosis Date Noted  . Restless legs syndrome (RLS) 08/29/2016  . Lumbar radiculopathy 08/29/2016  . Obesity (BMI 30-39.9) 02/23/2016  . Localized swelling, mass and lump, neck 02/23/2016  . Neuropathy 08/13/2012  . Essential hypertension 02/05/2009  . ARTHRITIS 02/05/2009  . Dysphagia 02/05/2009  . ABDOMINAL BLOATING 02/05/2009  . ABDOMINAL PAIN, LEFT LOWER QUADRANT 02/05/2009  . ESOPHAGEAL STRICTURE 02/04/2009  . GERD 02/04/2009  . GASTRITIS 02/04/2009  . HIATAL HERNIA 02/04/2009  . DIVERTICULOSIS OF COLON 02/04/2009  . IRRITABLE BOWEL SYNDROME 02/04/2009  . ENDOMETRIOSIS 02/04/2009    Review of Systems: Cardiovascular: negative for chest pain Respiratory: negative for SOB or hemoptysis Gastrointestinal: negative for abdominal pain Genitourinary: negative for dysuria or gross hematuria Current Meds  Medication Sig  . aspirin 81 MG tablet Take 81 mg by mouth daily.  . cyclobenzaprine (FLEXERIL) 10 MG tablet Take 1 tablet (10 mg total) at bedtime by mouth.  [name] Sodium (STOOL SOFTENER) 100 MG capsule Take 100 mg by mouth at bedtime.  .  famotidine (PEPCID) 20 MG tablet Take 1 tablet (20 mg total) by mouth at bedtime. As needed for stomach acid  . ferrous sulfate 325 (65 FE) MG tablet Take one po TID  . furosemide (LASIX) 20 MG tablet Take 1 tablet (20 mg total) by mouth daily.  Marland Kitchen gabapentin (NEURONTIN) 300 MG capsule Take 2 capsules (600 mg total) by mouth 2 (two) times daily.  Marland Kitchen latanoprost (XALATAN) 0.005 % ophthalmic solution Place 1 drop into both eyes at bedtime.   Marland Kitchen losartan-hydrochlorothiazide (HYZAAR) 50-12.5 MG tablet TAKE 1 TABLET BY MOUTH EVERY DAY  . metoprolol succinate (TOPROL-XL) 100 MG 24 hr tablet TAKE 1 TABLET BY MOUTH EVERY DAY  . omeprazole (PRILOSEC) 40 MG capsule Take 1 capsule (40 mg total) by mouth 2 (two) times daily.  . pramipexole (MIRAPEX) 1 MG tablet Take 1 tablet (1 mg total) by mouth 2 (two) times daily.  . Wheat Dextrin (BENEFIBER) POWD Take 1 tablespoon three times a day    Objective  Vitals: BP 126/74 (BP Location: Left Arm, Patient Position: Sitting, Cuff Size: Normal)   Pulse 67   Temp 98 F (36.7 C)   Ht 5\' 3"  (1.6 m)   Wt 192 lb 4 oz (87.2 kg)   SpO2 100%   BMI 34.06 kg/m  General: no acute distress , some coughing, no retractions; speaks fully Psych:  Alert and oriented, normal mood and affect HEENT:  Normocephalic, atraumatic, supple neck, moist mucous membranes, mildly erythematous pharynx without exudate, mild lymphadenopathy, supple neck Cardiovascular:  RRR without murmur. no edema Respiratory:  Good breath sounds  bilaterally, CTAB with normal respiratory effort without rhonchi Skin:  Warm, no rashes Neurologic:   Mental status is normal. normal gait  Assessment  1. Acute bacterial bronchitis      Plan  Discussion:  Treat for bacterial bronchitis due to persistent and worsening symptoms > 10 days.  Supportive care measures are recommended.  We discussed the use of mucolytic's, decongestants, antihistamines and antitussives as needed.  Tylenol or Advil are recommended if  needed.  Follow up: prn   Commons side effects, risks, benefits, and alternatives for medications and treatment plan prescribed today were discussed, and the patient expressed understanding of the given instructions. Patient is instructed to call or message via MyChart if he/she has any questions or concerns regarding our treatment plan. No barriers to understanding were identified. We discussed Red Flag symptoms and signs in detail. Patient expressed understanding regarding what to do in case of urgent or emergency type symptoms.  Medication list was reconciled, printed and provided to the patient in AVS. Patient instructions and summary information was reviewed with the patient as documented in the AVS. This note was prepared with assistance of Dragon voice recognition software. Occasional wrong-word or sound-a-like substitutions may have occurred due to the inherent limitations of voice recognition software  No orders of the defined types were placed in this encounter.  Meds ordered this encounter  Medications  . azithromycin (ZITHROMAX) 250 MG tablet    Sig: Take 2 tabs today, then 1 tab daily for 4 days    Dispense:  1 each    Refill:  0  . promethazine-dextromethorphan (PROMETHAZINE-DM) 6.25-15 MG/5ML syrup    Sig: Take 5 mLs by mouth 4 (four) times daily as needed for cough.    Dispense:  180 mL    Refill:  0

## 2017-03-20 NOTE — Patient Instructions (Signed)
Please return if needed.  If you have any questions or concerns, please don't hesitate to send me a message via MyChart or call the office at [phone]. Thank you for visiting with [place] today! It's our pleasure caring for you.   Acute Bronchitis, Adult Acute bronchitis is sudden (acute) swelling of the air tubes (bronchi) in the lungs. Acute bronchitis causes these tubes to fill with mucus, which can make it hard to breathe. It can also cause coughing or wheezing. In adults, acute bronchitis usually goes away within 2 weeks. A cough caused by bronchitis may last up to 3 weeks. Smoking, allergies, and asthma can make the condition worse. Repeated episodes of bronchitis may cause further lung problems, such as chronic obstructive pulmonary disease (COPD). What are the causes? This condition can be caused by germs and by substances that irritate the lungs, including:  Cold and flu viruses. This condition is most often caused by the same virus that causes a cold.  Bacteria.  Exposure to tobacco smoke, dust, fumes, and air pollution.  What increases the risk? This condition is more likely to develop in people who:  Have close contact with someone with acute bronchitis.  Are exposed to lung irritants, such as tobacco smoke, dust, fumes, and vapors.  Have a weak immune system.  Have a respiratory condition such as asthma.  What are the signs or symptoms? Symptoms of this condition include:  A cough.  Coughing up clear, yellow, or green mucus.  Wheezing.  Chest congestion.  Shortness of breath.  A fever.  Body aches.  Chills.  A sore throat.  How is this diagnosed? This condition is usually diagnosed with a physical exam. During the exam, your health care provider may order tests, such as chest X-rays, to rule out other conditions. He or she may also:  Test a sample of your mucus for bacterial infection.  Check the level of oxygen in your blood. This is done to check  for pneumonia.  Do a chest X-ray or lung function testing to rule out pneumonia and other conditions.  Perform blood tests.  Your health care provider will also ask about your symptoms and medical history. How is this treated? Most cases of acute bronchitis clear up over time without treatment. Your health care provider may recommend:  Drinking more fluids. Drinking more makes your mucus thinner, which may make it easier to breathe.  Taking a medicine for a fever or cough.  Taking an antibiotic medicine.  Using an inhaler to help improve shortness of breath and to control a cough.  Using a cool mist vaporizer or humidifier to make it easier to breathe.  Follow these instructions at home: Medicines  Take over-the-counter and prescription medicines only as told by your health care provider.  If you were prescribed an antibiotic, take it as told by your health care provider. Do not stop taking the antibiotic even if you start to feel better. General instructions  Get plenty of rest.  Drink enough fluids to keep your urine clear or pale yellow.  Avoid smoking and secondhand smoke. Exposure to cigarette smoke or irritating chemicals will make bronchitis worse. If you smoke and you need help quitting, ask your health care provider. Quitting smoking will help your lungs heal faster.  Use an inhaler, cool mist vaporizer, or humidifier as told by your health care provider.  Keep all follow-up visits as told by your health care provider. This is important. How is this prevented? To lower your  risk of getting this condition again:  Wash your hands often with soap and water. If soap and water are not available, use hand sanitizer.  Avoid contact with people who have cold symptoms.  Try not to touch your hands to your mouth, nose, or eyes.  Make sure to get the flu shot every year.  Contact a health care provider if:  Your symptoms do not improve in 2 weeks of treatment. Get  help right away if:  You cough up blood.  You have chest pain.  You have severe shortness of breath.  You become dehydrated.  You faint or keep feeling like you are going to faint.  You keep vomiting.  You have a severe headache.  Your fever or chills gets worse. This information is not intended to replace advice given to you by your health care provider. Make sure you discuss any questions you have with your health care provider. Document Released: 03/30/2004 Document Revised: 09/15/2015 Document Reviewed: 08/11/2015 Elsevier Interactive Patient Education  [employer].

## 2017-04-10 ENCOUNTER — Encounter: Payer: Self-pay | Admitting: Diagnostic Neuroimaging

## 2017-04-14 DIAGNOSIS — J0100 Acute maxillary sinusitis, unspecified: Secondary | ICD-10-CM | POA: Diagnosis not present

## 2017-04-14 DIAGNOSIS — R05 Cough: Secondary | ICD-10-CM | POA: Diagnosis not present

## 2017-04-19 ENCOUNTER — Other Ambulatory Visit: Payer: Self-pay | Admitting: Family Medicine

## 2017-05-23 DIAGNOSIS — D225 Melanocytic nevi of trunk: Secondary | ICD-10-CM | POA: Diagnosis not present

## 2017-05-23 DIAGNOSIS — L821 Other seborrheic keratosis: Secondary | ICD-10-CM | POA: Diagnosis not present

## 2017-05-23 DIAGNOSIS — D485 Neoplasm of uncertain behavior of skin: Secondary | ICD-10-CM | POA: Diagnosis not present

## 2017-05-23 DIAGNOSIS — L814 Other melanin hyperpigmentation: Secondary | ICD-10-CM | POA: Diagnosis not present

## 2017-05-30 DIAGNOSIS — H401232 Low-tension glaucoma, bilateral, moderate stage: Secondary | ICD-10-CM | POA: Diagnosis not present

## 2017-06-08 ENCOUNTER — Other Ambulatory Visit: Payer: Self-pay | Admitting: General Practice

## 2017-06-08 MED ORDER — CYCLOBENZAPRINE HCL 10 MG PO TABS: 10.0000 mg | ORAL_TABLET | Freq: Every day | ORAL | 3 refills | Status: DC

## 2017-06-08 NOTE — Telephone Encounter (Signed)
Please advise?   Last OV 01/04/17 Flexeril last filled 01/19/17 #30 with 3  Copied from [facility] #[id number]. Topic: General - Other >> Jun 08, 2017  8:44 AM Carolyn Stare wrote:  Pt call to say her insuracne will not pay for the below med and would like a hand written RX and she will have it filled somewhere else. Pt is asking if it can be ready for pick up today      cyclobenzaprine (FLEXERIL) 10 MG tablet   [phone]

## 2017-07-05 DIAGNOSIS — M5432 Sciatica, left side: Secondary | ICD-10-CM | POA: Diagnosis not present

## 2017-07-05 DIAGNOSIS — M9903 Segmental and somatic dysfunction of lumbar region: Secondary | ICD-10-CM | POA: Diagnosis not present

## 2017-07-05 DIAGNOSIS — M5431 Sciatica, right side: Secondary | ICD-10-CM | POA: Diagnosis not present

## 2017-07-06 DIAGNOSIS — H0015 Chalazion left lower eyelid: Secondary | ICD-10-CM | POA: Diagnosis not present

## 2017-07-10 DIAGNOSIS — M5432 Sciatica, left side: Secondary | ICD-10-CM | POA: Diagnosis not present

## 2017-07-10 DIAGNOSIS — M5431 Sciatica, right side: Secondary | ICD-10-CM | POA: Diagnosis not present

## 2017-07-10 DIAGNOSIS — M9903 Segmental and somatic dysfunction of lumbar region: Secondary | ICD-10-CM | POA: Diagnosis not present

## 2017-07-14 ENCOUNTER — Other Ambulatory Visit: Payer: Self-pay | Admitting: Family Medicine

## 2017-07-25 DIAGNOSIS — M9903 Segmental and somatic dysfunction of lumbar region: Secondary | ICD-10-CM | POA: Diagnosis not present

## 2017-07-25 DIAGNOSIS — M5431 Sciatica, right side: Secondary | ICD-10-CM | POA: Diagnosis not present

## 2017-07-25 DIAGNOSIS — M5432 Sciatica, left side: Secondary | ICD-10-CM | POA: Diagnosis not present

## 2017-07-31 DIAGNOSIS — M5431 Sciatica, right side: Secondary | ICD-10-CM | POA: Diagnosis not present

## 2017-07-31 DIAGNOSIS — M9903 Segmental and somatic dysfunction of lumbar region: Secondary | ICD-10-CM | POA: Diagnosis not present

## 2017-07-31 DIAGNOSIS — M5432 Sciatica, left side: Secondary | ICD-10-CM | POA: Diagnosis not present

## 2017-08-03 ENCOUNTER — Other Ambulatory Visit: Payer: Self-pay | Admitting: Diagnostic Neuroimaging

## 2017-08-06 ENCOUNTER — Ambulatory Visit (INDEPENDENT_AMBULATORY_CARE_PROVIDER_SITE_OTHER): Payer: Medicare Other | Admitting: Family Medicine

## 2017-08-06 ENCOUNTER — Other Ambulatory Visit: Payer: Self-pay

## 2017-08-06 ENCOUNTER — Encounter: Payer: Self-pay | Admitting: Family Medicine

## 2017-08-06 VITALS — BP 123/83 | HR 80 | Temp 98.2°F | Resp 16 | Ht 63.0 in | Wt 191.2 lb

## 2017-08-06 DIAGNOSIS — I10 Essential (primary) hypertension: Secondary | ICD-10-CM

## 2017-08-06 NOTE — Assessment & Plan Note (Signed)
Chronic problem.  Adequate control today.  Asymptomatic.  Check labs.  No anticipated med changes.  Will follow. 

## 2017-08-06 NOTE — Progress Notes (Signed)
   Subjective:    Patient ID: Andrea Byrd, female    DOB: [birth year], 72 y.o.   MRN: [id number]  HPI HTN- chronic problem, on Losartan HCTZ 50/12.5mg , Metoprolol 100mg  daily, and Lasix PRN swelling.  Well controlled.  Pt is gardening and doing yard work- but no formal exercise.  No CP, SOB, HAs, visual changes, abd pain, N/V, edema.   Review of Systems For ROS see HPI     Objective:   Physical Exam  Constitutional: She is oriented to person, place, and time. She appears well-developed and well-nourished. No distress.  HENT:  Head: Normocephalic and atraumatic.  Eyes: Pupils are equal, round, and reactive to light. Conjunctivae and EOM are normal.  Neck: Normal range of motion. Neck supple. No thyromegaly present.  Cardiovascular: Normal rate, regular rhythm, normal heart sounds and intact distal pulses.  No murmur heard. Pulmonary/Chest: Effort normal and breath sounds normal. No respiratory distress.  Abdominal: Soft. She exhibits no distension. There is no tenderness.  Musculoskeletal: She exhibits no edema.  Lymphadenopathy:    She has no cervical adenopathy.  Neurological: She is alert and oriented to person, place, and time.  Skin: Skin is warm and dry.  Psychiatric: She has a normal mood and affect. Her behavior is normal.  Vitals reviewed.         Assessment & Plan:

## 2017-08-06 NOTE — Patient Instructions (Addendum)
Schedule your Medicare Wellness Visit w/ Maudie Mercury at your convenience and follow up in 6 months w/ me to recheck BP We'll notify you of your lab results and make any changes if needed Continue to work on healthy diet and regular exercise- you can do it!! Call with any questions or concerns Have a great summer!!

## 2017-08-07 ENCOUNTER — Encounter: Payer: Self-pay | Admitting: Emergency Medicine

## 2017-08-07 LAB — CBC WITH DIFFERENTIAL/PLATELET
BASOS ABS: 68 {cells}/uL (ref 0–200)
Basophils Relative: 0.8 %
EOS ABS: 153 {cells}/uL (ref 15–500)
Eosinophils Relative: 1.8 %
HEMATOCRIT: 36.8 % (ref 35.0–45.0)
HEMOGLOBIN: 12.8 g/dL (ref 11.7–15.5)
LYMPHS ABS: 2346 {cells}/uL (ref 850–3900)
MCH: 30 pg (ref 27.0–33.0)
MCHC: 34.8 g/dL (ref 32.0–36.0)
MCV: 86.2 fL (ref 80.0–100.0)
MPV: 10.7 fL (ref 7.5–12.5)
Monocytes Relative: 6 %
NEUTROS ABS: 5423 {cells}/uL (ref 1500–7800)
Neutrophils Relative %: 63.8 %
Platelets: 272 10*3/uL (ref 140–400)
RBC: 4.27 10*6/uL (ref 3.80–5.10)
RDW: 12.8 % (ref 11.0–15.0)
Total Lymphocyte: 27.6 %
WBC mixed population: 510 cells/uL (ref 200–950)
WBC: 8.5 10*3/uL (ref 3.8–10.8)

## 2017-08-07 LAB — BASIC METABOLIC PANEL
BUN: 18 mg/dL (ref 7–25)
CO2: 31 mmol/L (ref 20–32)
Calcium: 9.6 mg/dL (ref 8.6–10.4)
Chloride: 105 mmol/L (ref 98–110)
Creat: 0.88 mg/dL (ref 0.60–0.93)
GLUCOSE: 116 mg/dL — AB (ref 65–99)
Potassium: 3.6 mmol/L (ref 3.5–5.3)
SODIUM: 142 mmol/L (ref 135–146)

## 2017-08-14 DIAGNOSIS — M5431 Sciatica, right side: Secondary | ICD-10-CM | POA: Diagnosis not present

## 2017-08-14 DIAGNOSIS — M5432 Sciatica, left side: Secondary | ICD-10-CM | POA: Diagnosis not present

## 2017-08-14 DIAGNOSIS — M9903 Segmental and somatic dysfunction of lumbar region: Secondary | ICD-10-CM | POA: Diagnosis not present

## 2017-08-29 ENCOUNTER — Ambulatory Visit: Payer: Medicare Other | Admitting: Diagnostic Neuroimaging

## 2017-08-30 DIAGNOSIS — M5432 Sciatica, left side: Secondary | ICD-10-CM | POA: Diagnosis not present

## 2017-08-30 DIAGNOSIS — M9903 Segmental and somatic dysfunction of lumbar region: Secondary | ICD-10-CM | POA: Diagnosis not present

## 2017-08-30 DIAGNOSIS — M5431 Sciatica, right side: Secondary | ICD-10-CM | POA: Diagnosis not present

## 2017-09-13 DIAGNOSIS — M5432 Sciatica, left side: Secondary | ICD-10-CM | POA: Diagnosis not present

## 2017-09-13 DIAGNOSIS — M9903 Segmental and somatic dysfunction of lumbar region: Secondary | ICD-10-CM | POA: Diagnosis not present

## 2017-09-13 DIAGNOSIS — M5431 Sciatica, right side: Secondary | ICD-10-CM | POA: Diagnosis not present

## 2017-09-25 NOTE — Progress Notes (Signed)
Subjective:   Andrea Byrd is a 72 y.o. female who presents for Medicare Annual (Subsequent) preventive examination.  Review of Systems:  No ROS.  Medicare Wellness Visit. Additional risk factors are reflected in the social history.  Cardiac Risk Factors include: advanced age (>10men, >53 women);hypertension;obesity (BMI >30kg/m2);dyslipidemia;family history of premature cardiovascular disease   Sleep patterns: Sleeps 7 hours.  Home Safety/Smoke Alarms: Feels safe in home. Smoke alarms in place.  Living environment; residence and Firearm Safety: Lives with husband in 1 story home, rail at steps.  Seat Belt Safety/Bike Helmet: Wears seat belt.   Female:   Pap- 2015     Mammo-03/01/2017, BI-RADS CATEGORY  1: Negative.       Dexa scan-02/19/2012. Postpone to December (with mammo)     CCS-Colonoscopy 02/09/2012, normal. Recall 10 years     Objective:     Vitals: BP 122/60 (BP Location: Left Arm, Patient Position: Sitting, Cuff Size: Normal)   Pulse 73   Ht 5\' 3"  (1.6 m)   Wt 191 lb 6 oz (86.8 kg)   SpO2 98%   BMI 33.90 kg/m   Body mass index is 33.9 kg/m.  Advanced Directives 09/26/2017 08/30/2016 08/25/2014  Does Patient Have a Medical Advance Directive? Yes Yes Yes  Type of Paramedic of [place];Living will Living will;Healthcare Power of Attorney Living will;Healthcare Power of [place] in Chart? No - copy requested No - copy requested No - copy requested    Tobacco Social History   Tobacco Use  Smoking Status Never Smoker  Smokeless Tobacco Never Used     Counseling given: Not Answered   Past Medical History:  Diagnosis Date  . Anemia   . Arthritis   . Cervical cancer ([place]) 1977  . Diverticulosis   . Endometriosis   . Esophageal stricture   . Gastritis   . GERD (gastroesophageal reflux disease)   . Glaucoma   . Herpes simplex type 1 infection   . Hiatal hernia   . History of colon polyps   .  History of gallstones   . Hypertension   . IBS (irritable bowel syndrome)   . Insomnia   . Post-operative nausea and vomiting   . Restless leg syndrome    Past Surgical History:  Procedure Laterality Date  . ABDOMINAL HYSTERECTOMY  1998  . APPENDECTOMY  1998  . CERVICAL CONE BIOPSY    . CHOLECYSTECTOMY  1980  . COLON RESECTION  2002  . COLON SURGERY  2003   removed 2 feet colon  . ESOPHAGEAL MANOMETRY N/A 04/24/2016   Procedure: ESOPHAGEAL MANOMETRY (EM);  Surgeon: Mauri Pole, MD;  Location: [facility]COPY;  Service: Endoscopy;  Laterality: N/A;  . HAND SURGERY     right/ thumb joints replaced both hands  . OOPHORECTOMY    . TUBAL LIGATION     Family History  Problem Relation Age of Onset  . Diabetes Mother   . Stroke Mother        brother, sister, MGM  . Colon polyps Mother   . Heart disease Father   . Kidney disease Father   . Colon polyps Father   . Lung disease Father   . Kidney cancer Sister   . Stroke Sister   . Diabetes Sister   . Colon polyps Sister   . Celiac disease Other        niece  . Heart attack Paternal Grandfather   . Colon cancer Neg  Hx   . Esophageal cancer Neg Hx   . Rectal cancer Neg Hx   . Stomach cancer Neg Hx    Social History   Socioeconomic History  . Marital status: Married    Spouse name: Not on file  . Number of children: 1  . Years of education: Not on file  . Highest education level: Not on file  Occupational History  . Occupation: Retired  Scientific laboratory technician  . Financial resource strain: Not on file  . Food insecurity:    Worry: Not on file    Inability: Not on file  . Transportation needs:    Medical: Not on file    Non-medical: Not on file  Tobacco Use  . Smoking status: Never Smoker  . Smokeless tobacco: Never Used  Substance and Sexual Activity  . Alcohol use: No  . Drug use: No  . Sexual activity: Not on file  Lifestyle  . Physical activity:    Days per week: Not on file    Minutes per session: Not on file    . Stress: Not on file  Relationships  . Social connections:    Talks on phone: Not on file    Gets together: Not on file    Attends religious service: Not on file    Active member of club or organization: Not on file    Attends meetings of clubs or organizations: Not on file    Relationship status: Not on file  Other Topics Concern  . Not on file  Social History Narrative   Lives with husband floyd   Drinks maybe 4 sodas a week    Outpatient Encounter Medications as of 09/26/2017  Medication Sig  . aspirin 81 MG tablet Take 81 mg by mouth daily.  . cyclobenzaprine (FLEXERIL) 10 MG tablet Take 1 tablet (10 mg total) by mouth at bedtime.  [name] Sodium (STOOL SOFTENER) 100 MG capsule Take 100 mg by mouth at bedtime.  . famotidine (PEPCID) 20 MG tablet Take 1 tablet (20 mg total) by mouth at bedtime. As needed for stomach acid  . furosemide (LASIX) 20 MG tablet Take 1 tablet (20 mg total) by mouth daily.  Marland Kitchen gabapentin (NEURONTIN) 300 MG capsule TAKE 2 CAPSULES (600 MG TOTAL) BY MOUTH 2 (TWO) TIMES DAILY.  Marland Kitchen latanoprost (XALATAN) 0.005 % ophthalmic solution Place 1 drop into both eyes at bedtime.   Marland Kitchen losartan-hydrochlorothiazide (HYZAAR) 50-12.5 MG tablet TAKE 1 TABLET BY MOUTH EVERY DAY  . metoprolol succinate (TOPROL-XL) 100 MG 24 hr tablet TAKE 1 TABLET BY MOUTH DAILY. NO FURTHER REFILLS WITHOUT AN APPT.  Marland Kitchen omeprazole (PRILOSEC) 40 MG capsule Take 1 capsule (40 mg total) by mouth 2 (two) times daily.  . ferrous sulfate 325 (65 FE) MG tablet Take one po TID (Patient not taking: Reported on 09/26/2017)  . pramipexole (MIRAPEX) 1 MG tablet Take 1 tablet (1 mg total) by mouth 2 (two) times daily. (Patient not taking: Reported on 09/26/2017)  . Wheat Dextrin (BENEFIBER) POWD Take 1 tablespoon three times a day (Patient not taking: Reported on 09/26/2017)   No facility-administered encounter medications on file as of 09/26/2017.     Activities of Daily Living In your present state of  health, do you have any difficulty performing the following activities: 09/26/2017  Hearing? N  Vision? N  Difficulty concentrating or making decisions? N  Walking or climbing stairs? N  Dressing or bathing? N  Doing errands, shopping? N  Preparing Food and eating ?  N  Using the Toilet? N  In the past six months, have you accidently leaked urine? N  Do you have problems with loss of bowel control? N  Managing your Medications? N  Managing your Finances? N  Housekeeping or managing your Housekeeping? N  Some recent data might be hidden    Patient Care Team: [name], MD as PCP - General (Family Medicine) Mauri Pole, MD as Consulting Physician (Gastroenterology) Penni Bombard, MD as Consulting Physician (Neurology) Harriett Sine, MD as Consulting Physician (Dermatology) Pa, [place] (Specialist)    Assessment:   This is a routine wellness examination for [place].  Exercise Activities and Dietary recommendations Current Exercise Habits: The patient does not participate in regular exercise at present(Maintains yard, garden and house daily), Exercise limited by: None identified   Diet (meal preparation, eat out, water intake, caffeinated beverages, dairy products, fruits and vegetables): Drinks water, tea and soda.   Breakfast: skips Lunch: tomato sandwich Dinner: soup; protein and vegetables.   Goals    . Increase physical activity     Stay active by gardening and yard maintenance.     . Weight (lb) < 180 lb (81.6 kg)     Lose weight by increasing activity.        Fall Risk Fall Risk  09/26/2017 08/30/2016 08/29/2016 02/23/2016  Falls in the past year? Yes No No Yes  Comment tripped over dog; stepped off ladder - - -  Number falls in past yr: 2 or more - - 1  Injury with Fall? No - - No  Follow up Falls prevention discussed - - -    Depression Screen PHQ 2/9 Scores 09/26/2017 08/30/2016 02/23/2016  PHQ - 2 Score 0 0 0  PHQ- 9  Score - - 0     Cognitive Function MMSE - Mini Mental State Exam 09/26/2017  Orientation to time 5  Orientation to Place 5  Registration 3  Attention/ Calculation 5  Recall 3  Language- name 2 objects 2  Language- repeat 1  Language- follow 3 step command 3  Language- read & follow direction 1  Write a sentence 1  Copy design 1  Total score 30        Immunization History  Administered Date(s) Administered  . Influenza,inj,Quad PF,6+ Mos 12/23/2014, 10/24/2015, 11/14/2016  . Pneumococcal Conjugate-13 09/09/2013  . Pneumococcal Polysaccharide-23 12/20/2011  . Tdap 07/28/2005    Screening Tests Health Maintenance  Topic Date Due  . TETANUS/TDAP  08/07/2018 (Originally 07/29/2015)  . Hepatitis C Screening  08/07/2018 (Originally 1945/11/02)  . INFLUENZA VACCINE  10/04/2017  . MAMMOGRAM  03/02/2019  . COLONOSCOPY  02/08/2022  . DEXA SCAN  Completed  . PNA vac Low Risk Adult  Completed   Declines Shingrix d/t insurance coverage.      Plan:    Bring a copy of your living will and/or healthcare power of attorney to your next office visit.  Continue doing brain stimulating activities (puzzles, reading, adult coloring books, staying active) to keep memory sharp.   I have personally reviewed and noted the following in the patient's chart:   . Medical and social history . Use of alcohol, tobacco or illicit drugs  . Current medications and supplements . Functional ability and status . Nutritional status . Physical activity . Advanced directives . List of other physicians . Hospitalizations, surgeries, and ER visits in previous 12 months . Vitals . Screenings to include cognitive, depression, and falls . Referrals and appointments  In  addition, I have reviewed and discussed with patient certain preventive protocols, quality metrics, and best practice recommendations. A written personalized care plan for preventive services as well as general preventive health  recommendations were provided to patient.     Gerilyn Nestle, RN  09/26/2017  PCP Notes: -pt c/o right hip pain, has been advised by chiropractor hip replacement needed. Declines appt.  -pt c/o hand arthritis pain has increased, declines appt.  -F/U with PCP 02/2018, advised to call for sooner appt if needed for above pain issues.

## 2017-09-26 ENCOUNTER — Ambulatory Visit (INDEPENDENT_AMBULATORY_CARE_PROVIDER_SITE_OTHER): Payer: Medicare Other

## 2017-09-26 ENCOUNTER — Other Ambulatory Visit: Payer: Self-pay

## 2017-09-26 VITALS — BP 122/60 | HR 73 | Ht 63.0 in | Wt 191.4 lb

## 2017-09-26 DIAGNOSIS — E669 Obesity, unspecified: Secondary | ICD-10-CM

## 2017-09-26 DIAGNOSIS — Z0000 Encounter for general adult medical examination without abnormal findings: Secondary | ICD-10-CM | POA: Diagnosis not present

## 2017-09-26 NOTE — Patient Instructions (Addendum)

## 2017-09-26 NOTE — Progress Notes (Signed)
RN MWV note reviewed in PCP absence.  Varnell Donate Cody Durk Carmen, PA-C  

## 2017-09-27 DIAGNOSIS — M5432 Sciatica, left side: Secondary | ICD-10-CM | POA: Diagnosis not present

## 2017-09-27 DIAGNOSIS — M5431 Sciatica, right side: Secondary | ICD-10-CM | POA: Diagnosis not present

## 2017-09-27 DIAGNOSIS — M9903 Segmental and somatic dysfunction of lumbar region: Secondary | ICD-10-CM | POA: Diagnosis not present

## 2017-10-02 ENCOUNTER — Ambulatory Visit (INDEPENDENT_AMBULATORY_CARE_PROVIDER_SITE_OTHER): Payer: Medicare Other | Admitting: Diagnostic Neuroimaging

## 2017-10-02 ENCOUNTER — Encounter: Payer: Self-pay | Admitting: Diagnostic Neuroimaging

## 2017-10-02 VITALS — BP 103/64 | HR 76 | Ht 63.0 in | Wt 190.4 lb

## 2017-10-02 DIAGNOSIS — M5416 Radiculopathy, lumbar region: Secondary | ICD-10-CM | POA: Diagnosis not present

## 2017-10-02 DIAGNOSIS — G2581 Restless legs syndrome: Secondary | ICD-10-CM | POA: Diagnosis not present

## 2017-10-02 MED ORDER — PRAMIPEXOLE DIHYDROCHLORIDE 1 MG PO TABS: 1.0000 mg | ORAL_TABLET | Freq: Two times a day (BID) | ORAL | 4 refills | Status: DC

## 2017-10-02 MED ORDER — GABAPENTIN 300 MG PO CAPS: 600.0000 mg | ORAL_CAPSULE | Freq: Two times a day (BID) | ORAL | 4 refills | Status: DC

## 2017-10-02 NOTE — Progress Notes (Signed)
[facility]  PATIENT: Andrea Byrd DOB: [birth year]  REFERRING CLINICIAN: [place] HISTORY FROM: patient REASON FOR VISIT: follow up   HISTORICAL  CHIEF COMPLAINT:  Chief Complaint  Patient presents with  . Follow-up  . RLS    Stable.      HISTORY OF PRESENT ILLNESS:   UPDATE (10/02/17, VRP): Since last visit, doing well. Symptoms are mild. Severity is mild. No alleviating or aggravating factors. Tolerating meds. (on side note, pt husband is in hospital with stroke, and planning to be d/c'd today).   UPDATE 08/29/16: Since last visit, RLS stable on gabapentin and pramipexole. Sharp pains in toes and feet slightly worse.   UPDATE 09/01/15: Since last visit, doing well. On gabapentin 600mg  BID + pramipexole 1mg  BID. Sxs well controlled. No side effects.   UPDATE 08/25/14: Since last visit, doing well. Has had chiro tx for back issues and feels better. On gabapentin 300mg  TID + pramipex 1mg  qhs. Satisfied with current dosing.   UPDATE 04/17/14: Since last visit, symptoms have continued. Numbness in feet, restless legs at night. On gabapentin 300mg  TID. PCP also tried her on pramipexole 0.25mg x3 at bedtime.   UPDATE 08/13/12: 72 year old right-handed female with history of hypertension, restless leg syndrome, here for evaluation of progressive numbness and tingling in her toes and feet. I previously saw this patient in October 2013 as referred by primary care physician. Now patient is referred to me by orthopedic surgery. Since last visit patient's symptoms have progressed. Now she has numbness and tingling in her bilateral feet up to her ankles. She has intermittent sharp sensations as well. Symptoms are fairly symmetric. No symptoms in her upper legs, lower back, torso, arms or neck.  PRIOR HPI (12/18/11): 72 year old right-handed female with history of hypertension, this is a syndrome, here for evaluation of numbness in her feet for the past 3 months. Patient describes  numbness and tingling in her toes and bottom of her feet. No significant pain. She also feels coldness in her feet. No back pain or symptoms radiating from her back to her feet. No symptoms in her hands or fingers. No neck pain. Symptoms are mild and gradually progressing. 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 She did not have any numbness in the symptoms at that time.   REVIEW OF SYSTEMS: Full 14 system review of systems performed and negative except: negative   ALLERGIES: Allergies  Allergen Reactions  . Nitrofurantoin     Makes mouth break out/blisters  . Propoxyphene N-Acetaminophen     REACTION: nausea  . Dexilant [Dexlansoprazole] Nausea And Vomiting  . Nexium [Esomeprazole Magnesium] Nausea And Vomiting  . Nortriptyline Hcl Nausea And Vomiting  . Hydrocodone-Homatropine Itching and Rash  . Morphine Itching and Rash    HOME MEDICATIONS: Outpatient Medications Prior to Visit  Medication Sig Dispense Refill  . aspirin 81 MG tablet Take 81 mg by mouth daily.    . cyclobenzaprine (FLEXERIL) 10 MG tablet Take 1 tablet (10 mg total) by mouth at bedtime. 30 tablet 3  . Docusate Sodium (STOOL SOFTENER) 100 MG capsule Take 100 mg by mouth at bedtime.    . famotidine (PEPCID) 20 MG tablet Take 1 tablet (20 mg total) by mouth at bedtime. As needed for stomach acid 90 tablet 4  . ferrous sulfate 325 (65 FE) MG tablet Take one po TID 90 tablet 3  . furosemide (LASIX) 20 MG tablet  Take 1 tablet (20 mg total) by mouth daily. 30 tablet 3  . gabapentin (NEURONTIN) 300 MG capsule TAKE 2 CAPSULES (600 MG TOTAL) BY MOUTH 2 (TWO) TIMES DAILY. 360 capsule 0  . latanoprost (XALATAN) 0.005 % ophthalmic solution Place 1 drop into both eyes at bedtime.     Marland Kitchen losartan-hydrochlorothiazide (HYZAAR) 50-12.5 MG tablet TAKE 1 TABLET BY MOUTH EVERY DAY 90 tablet 1  . metoprolol succinate (TOPROL-XL) 100 MG 24  hr tablet TAKE 1 TABLET BY MOUTH DAILY. NO FURTHER REFILLS WITHOUT AN APPT. 90 tablet 0  . omeprazole (PRILOSEC) 40 MG capsule Take 1 capsule (40 mg total) by mouth 2 (two) times daily. 120 capsule 3  . Wheat Dextrin (BENEFIBER) POWD Take 1 tablespoon three times a day  0  . pramipexole (MIRAPEX) 1 MG tablet Take 1 tablet (1 mg total) by mouth 2 (two) times daily. (Patient not taking: Reported on 09/26/2017) 180 tablet 4   No facility-administered medications prior to visit.     PAST MEDICAL HISTORY: Past Medical History:  Diagnosis Date  . Anemia   . Arthritis   . Cervical cancer ([place]) 1977  . Diverticulosis   . Endometriosis   . Esophageal stricture   . Gastritis   . GERD (gastroesophageal reflux disease)   . Glaucoma   . Herpes simplex type 1 infection   . Hiatal hernia   . History of colon polyps   . History of gallstones   . Hypertension   . IBS (irritable bowel syndrome)   . Insomnia   . Post-operative nausea and vomiting   . Restless leg syndrome     PAST SURGICAL HISTORY: Past Surgical History:  Procedure Laterality Date  . ABDOMINAL HYSTERECTOMY  1998  . APPENDECTOMY  1998  . CERVICAL CONE BIOPSY    . CHOLECYSTECTOMY  1980  . COLON RESECTION  2002  . COLON SURGERY  2003   removed 2 feet colon  . ESOPHAGEAL MANOMETRY N/A 04/24/2016   Procedure: ESOPHAGEAL MANOMETRY (EM);  Surgeon: Mauri Pole, MD;  Location: [facility];  Service: Endoscopy;  Laterality: N/A;  . HAND SURGERY     right/ thumb joints replaced both hands  . OOPHORECTOMY    . TUBAL LIGATION      FAMILY HISTORY: Family History  Problem Relation Age of Onset  . Diabetes Mother   . Stroke Mother        brother, sister, MGM  . Colon polyps Mother   . Heart disease Father   . Kidney disease Father   . Colon polyps Father   . Lung disease Father   . Kidney cancer Sister   . Stroke Sister   . Diabetes Sister   . Colon polyps Sister   . Celiac disease Other        niece  . Heart  attack Paternal Grandfather   . Colon cancer Neg Hx   . Esophageal cancer Neg Hx   . Rectal cancer Neg Hx   . Stomach cancer Neg Hx     SOCIAL HISTORY:  Social History   Socioeconomic History  . Marital status: Married    Spouse name: Not on file  . Number of children: 1  . Years of education: Not on file  . Highest education level: Not on file  Occupational History  . Occupation: Retired  Scientific laboratory technician  . Financial resource strain: Not on file  . Food insecurity:    Worry: Not on file    Inability: Not on  file  . Transportation needs:    Medical: Not on file    Non-medical: Not on file  Tobacco Use  . Smoking status: Never Smoker  . Smokeless tobacco: Never Used  Substance and Sexual Activity  . Alcohol use: No  . Drug use: No  . Sexual activity: Not on file  Lifestyle  . Physical activity:    Days per week: Not on file    Minutes per session: Not on file  . Stress: Not on file  Relationships  . Social connections:    Talks on phone: Not on file    Gets together: Not on file    Attends religious service: Not on file    Active member of club or organization: Not on file    Attends meetings of clubs or organizations: Not on file    Relationship status: Not on file  . Intimate partner violence:    Fear of current or ex partner: Not on file    Emotionally abused: Not on file    Physically abused: Not on file    Forced sexual activity: Not on file  Other Topics Concern  . Not on file  Social History Narrative   Lives with husband floyd   Drinks maybe 4 sodas a week     PHYSICAL EXAM  GENERAL EXAM/CONSTITUTIONAL: Vitals:  Vitals:   10/02/17 0837  BP: 103/64  Pulse: 76  Weight: 190 lb 6.4 oz (86.4 kg)  Height: 5\' 3"  (1.6 m)     Body mass index is 33.73 kg/m. Wt Readings from Last 3 Encounters:  10/02/17 190 lb 6.4 oz (86.4 kg)  09/26/17 191 lb 6 oz (86.8 kg)  08/06/17 191 lb 4 oz (86.8 kg)     Patient is in no distress; well developed,  nourished and groomed; neck is supple  CARDIOVASCULAR:  Examination of carotid arteries is normal; no carotid bruits  Regular rate and rhythm, no murmurs  Examination of peripheral vascular system by observation and palpation is normal  EYES:  Ophthalmoscopic exam of optic discs and posterior segments is normal; no papilledema or hemorrhages  No exam data present  MUSCULOSKELETAL:  Gait, strength, tone, movements noted in Neurologic exam below  NEUROLOGIC: MENTAL STATUS:  MMSE - Mini Mental State Exam 09/26/2017  Orientation to time 5  Orientation to Place 5  Registration 3  Attention/ Calculation 5  Recall 3  Language- name 2 objects 2  Language- repeat 1  Language- follow 3 step command 3  Language- read & follow direction 1  Write a sentence 1  Copy design 1  Total score 30    awake, alert, oriented to person, place and time  recent and remote memory intact  normal attention and concentration  language fluent, comprehension intact, naming intact  fund of knowledge appropriate  CRANIAL NERVE:   2nd - no papilledema on fundoscopic exam  2nd, 3rd, 4th, 6th - pupils equal and reactive to light, visual fields full to confrontation, extraocular muscles intact, no nystagmus  5th - facial sensation symmetric  7th - facial strength --> MILD LEFT HEMIFACIAL SPASM (PRIOR LEFT BELL'S PALSY).  8th - hearing intact  9th - palate elevates symmetrically, uvula midline  11th - shoulder shrug symmetric  12th - tongue protrusion midline  MOTOR:   normal bulk and tone, full strength in the BUE, BLE  SENSORY:   normal and symmetric to light touch, temperature, vibration  COORDINATION:   finger-nose-finger, fine finger movements normal  REFLEXES:   deep  tendon reflexes TRACE and symmetric  GAIT/STATION:   narrow based gait    DIAGNOSTIC DATA (LABS, IMAGING, TESTING) - I reviewed patient records, labs, notes, testing and imaging myself where  available.  Lab Results  Component Value Date   WBC 8.5 08/06/2017   HGB 12.8 08/06/2017   HCT 36.8 08/06/2017   MCV 86.2 08/06/2017   PLT 272 08/06/2017      Component Value Date/Time   NA 142 08/06/2017 1651   NA 141 10/11/2015   K 3.6 08/06/2017 1651   CL 105 08/06/2017 1651   CO2 31 08/06/2017 1651   GLUCOSE 116 (H) 08/06/2017 1651   BUN 18 08/06/2017 1651   BUN 13 10/11/2015   CREATININE 0.88 08/06/2017 1651   CALCIUM 9.6 08/06/2017 1651   PROT 6.4 01/04/2017 1334   ALBUMIN 3.9 01/04/2017 1334   AST 21 01/04/2017 1334   ALT 24 01/04/2017 1334   ALKPHOS 106 01/04/2017 1334   BILITOT 0.3 01/04/2017 1334   Lab Results  Component Value Date   CHOL 174 08/30/2016   No results found for: HGBA1C No results found for: VITAMINB12 Lab Results  Component Value Date   TSH 1.95 01/04/2017    TSH 1.35  B12 826  08/27/12 NCV/EMG - electrodiagnostic evidence of bilateral S1 radiculopathies. - left median neuropathy at the wrist consistent with left carpal tunnel syndrome.  09/20/12 MRI lumbar spine (without) demonstrating: - At L5-S1: disc bulging with mild-moderate biforaminal foraminal stenosis - At L3-4, L4-5: disc bulging and facet hypertrophy with no spinal stenosis and mild biforaminal narrowing    ASSESSMENT AND PLAN  72 y.o. year old female here with progressive numbness in feet, likely due to lumbar radiculopathies. Also with associated restless leg symptoms. Doing well on gabapentin and pramipexole.   Dx: lumbar radiculopathies (S1) + RLS  Restless legs syndrome (RLS)  Lumbar radiculopathy    PLAN:  RLS (established, stable) - continue gabapentin and pramipexole  LUMBAR RADICULOPATHIES (established, stable) - stable; continue exercises  Meds ordered this encounter  Medications  . pramipexole (MIRAPEX) 1 MG tablet    Sig: Take 1 tablet (1 mg total) by mouth 2 (two) times daily.    Dispense:  180 tablet    Refill:  4  . gabapentin (NEURONTIN)  300 MG capsule    Sig: Take 2 capsules (600 mg total) by mouth 2 (two) times daily.    Dispense:  360 capsule    Refill:  4   Return in about 1 year (around 10/03/2018).     Penni Bombard, MD 0/35/5974, 1:63 AM Certified in Neurology, Neurophysiology and Neuroimaging  [facility] [address], [place], [place] [address] [phone]

## 2017-10-03 ENCOUNTER — Ambulatory Visit (INDEPENDENT_AMBULATORY_CARE_PROVIDER_SITE_OTHER): Payer: Medicare Other | Admitting: Physician Assistant

## 2017-10-03 ENCOUNTER — Encounter: Payer: Self-pay | Admitting: Physician Assistant

## 2017-10-03 ENCOUNTER — Other Ambulatory Visit: Payer: Self-pay

## 2017-10-03 ENCOUNTER — Ambulatory Visit (HOSPITAL_BASED_OUTPATIENT_CLINIC_OR_DEPARTMENT_OTHER)
Admission: RE | Admit: 2017-10-03 | Discharge: 2017-10-03 | Disposition: A | Discharge status: Home / Self Care | Payer: Medicare Other | Source: Ambulatory Visit | Attending: Physician Assistant | Admitting: Physician Assistant

## 2017-10-03 ENCOUNTER — Ambulatory Visit: Payer: Self-pay

## 2017-10-03 VITALS — BP 121/68 | HR 73 | Temp 98.1°F | Resp 16 | Ht 63.0 in | Wt 191.0 lb

## 2017-10-03 DIAGNOSIS — K5730 Diverticulosis of large intestine without perforation or abscess without bleeding: Secondary | ICD-10-CM | POA: Insufficient documentation

## 2017-10-03 DIAGNOSIS — R1031 Right lower quadrant pain: Secondary | ICD-10-CM | POA: Insufficient documentation

## 2017-10-03 DIAGNOSIS — R1084 Generalized abdominal pain: Secondary | ICD-10-CM

## 2017-10-03 DIAGNOSIS — K449 Diaphragmatic hernia without obstruction or gangrene: Secondary | ICD-10-CM | POA: Diagnosis not present

## 2017-10-03 LAB — POCT URINALYSIS DIPSTICK
Bilirubin, UA: NEGATIVE
Blood, UA: NEGATIVE
GLUCOSE UA: NEGATIVE
Ketones, UA: NEGATIVE
Leukocytes, UA: NEGATIVE
Nitrite, UA: NEGATIVE
PH UA: 5 (ref 5.0–8.0)
PROTEIN UA: NEGATIVE
UROBILINOGEN UA: 0.2 U/dL

## 2017-10-03 NOTE — Progress Notes (Signed)
Patient presents to clinic today c/o 4 days of right lower abdominal pain. Notes pain is sharp and is sometimes worse with movement. Notes pain is about 7/10. Is not alleviated with rest, bowel movement or lying down. Is sometimes worse with moving or positional change, but not always. Denies nausea or vomiting. Denies change to bowel/bladder habits. Denies numbness, tingling. Took Aleve with some relief of symptoms. She denies trauma, injury. Patient is s/p appendectomy and hysterectomy.  Past Medical History:  Diagnosis Date  . Anemia   . Arthritis   . Cervical cancer ([place]) 1977  . Diverticulosis   . Endometriosis   . Esophageal stricture   . Gastritis   . GERD (gastroesophageal reflux disease)   . Glaucoma   . Herpes simplex type 1 infection   . Hiatal hernia   . History of colon polyps   . History of gallstones   . Hypertension   . IBS (irritable bowel syndrome)   . Insomnia   . Post-operative nausea and vomiting   . Restless leg syndrome     Current Outpatient Medications on File Prior to Visit  Medication Sig Dispense Refill  . aspirin 81 MG tablet Take 81 mg by mouth daily.    . cyclobenzaprine (FLEXERIL) 10 MG tablet Take 1 tablet (10 mg total) by mouth at bedtime. 30 tablet 3  . Docusate Sodium (STOOL SOFTENER) 100 MG capsule Take 100 mg by mouth at bedtime.    . famotidine (PEPCID) 20 MG tablet Take 1 tablet (20 mg total) by mouth at bedtime. As needed for stomach acid 90 tablet 4  . furosemide (LASIX) 20 MG tablet Take 1 tablet (20 mg total) by mouth daily. 30 tablet 3  . gabapentin (NEURONTIN) 300 MG capsule Take 2 capsules (600 mg total) by mouth 2 (two) times daily. 360 capsule 4  . latanoprost (XALATAN) 0.005 % ophthalmic solution Place 1 drop into both eyes at bedtime.     Marland Kitchen losartan-hydrochlorothiazide (HYZAAR) 50-12.5 MG tablet TAKE 1 TABLET BY MOUTH EVERY DAY 90 tablet 1  . metoprolol succinate (TOPROL-XL) 100 MG 24 hr tablet TAKE 1 TABLET BY MOUTH DAILY. NO  FURTHER REFILLS WITHOUT AN APPT. 90 tablet 0  . omeprazole (PRILOSEC) 40 MG capsule Take 1 capsule (40 mg total) by mouth 2 (two) times daily. 120 capsule 3  . pramipexole (MIRAPEX) 1 MG tablet Take 1 tablet (1 mg total) by mouth 2 (two) times daily. 180 tablet 4  . ferrous sulfate 325 (65 FE) MG tablet Take one po TID (Patient not taking: Reported on 10/03/2017) 90 tablet 3  . Wheat Dextrin (BENEFIBER) POWD Take 1 tablespoon three times a day (Patient not taking: Reported on 10/03/2017)  0   No current facility-administered medications on file prior to visit.     Allergies  Allergen Reactions  . Nitrofurantoin     Makes mouth break out/blisters  . Propoxyphene N-Acetaminophen     REACTION: nausea  . Dexilant [Dexlansoprazole] Nausea And Vomiting  . Nexium [Esomeprazole Magnesium] Nausea And Vomiting  . Nortriptyline Hcl Nausea And Vomiting  . Hydrocodone-Homatropine Itching and Rash  . Morphine Itching and Rash    Family History  Problem Relation Age of Onset  . Diabetes Mother   . Stroke Mother        brother, sister, MGM  . Colon polyps Mother   . Heart disease Father   . Kidney disease Father   . Colon polyps Father   . Lung disease Father   .  Kidney cancer Sister   . Stroke Sister   . Diabetes Sister   . Colon polyps Sister   . Celiac disease Other        niece  . Heart attack Paternal Grandfather   . Colon cancer Neg Hx   . Esophageal cancer Neg Hx   . Rectal cancer Neg Hx   . Stomach cancer Neg Hx     Social History   Socioeconomic History  . Marital status: Married    Spouse name: Not on file  . Number of children: 1  . Years of education: Not on file  . Highest education level: Not on file  Occupational History  . Occupation: Retired  Scientific laboratory technician  . Financial resource strain: Not on file  . Food insecurity:    Worry: Not on file    Inability: Not on file  . Transportation needs:    Medical: Not on file    Non-medical: Not on file  Tobacco Use  .  Smoking status: Never Smoker  . Smokeless tobacco: Never Used  Substance and Sexual Activity  . Alcohol use: No  . Drug use: No  . Sexual activity: Not on file  Lifestyle  . Physical activity:    Days per week: Not on file    Minutes per session: Not on file  . Stress: Not on file  Relationships  . Social connections:    Talks on phone: Not on file    Gets together: Not on file    Attends religious service: Not on file    Active member of club or organization: Not on file    Attends meetings of clubs or organizations: Not on file    Relationship status: Not on file  Other Topics Concern  . Not on file  Social History Narrative   Lives with husband floyd   Drinks maybe 4 sodas a week    Review of Systems - See HPI.  All other ROS are negative.  BP 121/68   Pulse 73   Temp 98.1 F (36.7 C) (Oral)   Resp 16   Ht 5' 3"  (1.6 m)   Wt 191 lb (86.6 kg)   SpO2 98%   BMI 33.83 kg/m   Physical Exam  Constitutional: She is oriented to person, place, and time. She appears well-developed and well-nourished.  HENT:  Head: Normocephalic and atraumatic.  Cardiovascular: Normal rate, regular rhythm, normal heart sounds and intact distal pulses.  Pulmonary/Chest: Effort normal.  Abdominal: Normal appearance and bowel sounds are normal. There is tenderness in the right lower quadrant. No hernia.  Neurological: She is alert and oriented to person, place, and time.  Psychiatric: She has a normal mood and affect.  Vitals reviewed.   Recent Results (from the past 2160 hour(s))  Basic metabolic panel     Status: Abnormal   Collection Time: 08/06/17  4:51 PM  Result Value Ref Range   Glucose, Bld 116 (H) 65 - 99 mg/dL    Comment: .            Fasting reference interval . For someone without known diabetes, a glucose value between 100 and 125 mg/dL is consistent with prediabetes and should be confirmed with a follow-up test. .    BUN 18 7 - 25 mg/dL   Creat 0.88 0.60 - 0.93  mg/dL    Comment: For patients >52 years of age, the reference limit for Creatinine is approximately 13% higher for people identified as African-American. Marland Kitchen  BUN/Creatinine Ratio NOT APPLICABLE 6 - 22 (calc)   Sodium 142 135 - 146 mmol/L   Potassium 3.6 3.5 - 5.3 mmol/L   Chloride 105 98 - 110 mmol/L   CO2 31 20 - 32 mmol/L   Calcium 9.6 8.6 - 10.4 mg/dL  CBC with Differential/Platelet     Status: None   Collection Time: 08/06/17  4:51 PM  Result Value Ref Range   WBC 8.5 3.8 - 10.8 Thousand/uL   RBC 4.27 3.80 - 5.10 Million/uL   Hemoglobin 12.8 11.7 - 15.5 g/dL   HCT 36.8 35.0 - 45.0 %   MCV 86.2 80.0 - 100.0 fL   MCH 30.0 27.0 - 33.0 pg   MCHC 34.8 32.0 - 36.0 g/dL   RDW 12.8 11.0 - 15.0 %   Platelets 272 140 - 400 Thousand/uL   MPV 10.7 7.5 - 12.5 fL   Neutro Abs 5,423 1,500 - 7,800 cells/uL   Lymphs Abs 2,346 850 - 3,900 cells/uL   WBC mixed population 510 200 - 950 cells/uL   Eosinophils Absolute 153 15 - 500 cells/uL   Basophils Absolute 68 0 - 200 cells/uL   Neutrophils Relative % 63.8 %   Total Lymphocyte 27.6 %   Monocytes Relative 6.0 %   Eosinophils Relative 1.8 %   Basophils Relative 0.8 %   Assessment/Plan: 1. RLQ abdominal pain S/p appendectomy. Cannot reproduce with ROM so MSK etiology less likely. Question colitis versus diverticulitis versus epiploic appendagitis. Will obtain CBC, CMP. POC UA dip unremarkable. Will obtain CT Abd/Pelvis. Supportive measures and OTC pain medication reviewed. ER for any worsening of symptoms before workup is complete.  - POCT urinalysis dipstick - CT Abdomen Pelvis Wo Contrast; Future - CBC w/Diff - Comp Met (CMET)   Leeanne Rio, PA-C

## 2017-10-03 NOTE — Telephone Encounter (Signed)
Pt. Reports she has had a "pressure in her stomach" for 2 weeks, but started getting worse Sunday. Pain is in right lower quadrant of abd. Pain is constant now. No availability with Dr. Birdie Riddle. Appointment made for today.  Reason for Disposition . [1] MODERATE pain (e.g., interferes with normal activities) AND [2] pain comes and goes (cramps) AND [3] present > 24 hours  (Exception: pain with Vomiting or Diarrhea - see that Guideline)  Answer Assessment - Initial Assessment Questions 1. LOCATION: "Where does it hurt?"      Right lower quadrant 2. RADIATION: "Does the pain shoot anywhere else?" (e.g., chest, back)       Right leg 3. ONSET: "When did the pain begin?" (e.g., minutes, hours or days ago)      Saturday started getting worse 4. SUDDEN: "Gradual or sudden onset?"     Gradual 5. PATTERN "Does the pain come and go, or is it constant?"    - If constant: "Is it getting better, staying the same, or worsening?"      (Note: Constant means the pain never goes away completely; most serious pain is constant and it progresses)     - If intermittent: "How long does it last?" "Do you have pain now?"     (Note: Intermittent means the pain goes away completely between bouts)     Constant 6. SEVERITY: "How bad is the pain?"  (e.g., Scale 1-10; mild, moderate, or severe)   - MILD (1-3): doesn't interfere with normal activities, abdomen soft and not tender to touch    - MODERATE (4-7): interferes with normal activities or awakens from sleep, tender to touch    - SEVERE (8-10): excruciating pain, doubled over, unable to do any normal activities      8 7. RECURRENT SYMPTOM: "Have you ever had this type of abdominal pain before?" If so, ask: "When was the last time?" and "What happened that time?"      No 8. CAUSE: "What do you think is causing the abdominal pain?"     Unsure 9. RELIEVING/AGGRAVATING FACTORS: "What makes it better or worse?" (e.g., movement, antacids, bowel movement)      Nothing 10. OTHER SYMPTOMS: "Has there been any vomiting, diarrhea, constipation, or urine problems?"       No 11. PREGNANCY: "Is there any chance you are pregnant?" "When was your last menstrual period?"       No  Protocols used: ABDOMINAL PAIN - [facility]

## 2017-10-03 NOTE — Telephone Encounter (Signed)
FYI

## 2017-10-03 NOTE — Patient Instructions (Addendum)
Please keep well-hydrated and get plenty of rest. Alternate Tylenol and Ibuprofen for pain. We are getting labs and imaging to further assess.   We will alter treatment based on results.  If anything acutely worsens before workup is complete you will need ER assessment.   Speak with Levada Dy regarding your CT scan.

## 2017-10-04 LAB — COMPREHENSIVE METABOLIC PANEL
ALK PHOS: 103 U/L (ref 39–117)
ALT: 28 U/L (ref 0–35)
AST: 24 U/L (ref 0–37)
Albumin: 4 g/dL (ref 3.5–5.2)
BILIRUBIN TOTAL: 0.4 mg/dL (ref 0.2–1.2)
BUN: 17 mg/dL (ref 6–23)
CO2: 31 mEq/L (ref 19–32)
Calcium: 9.7 mg/dL (ref 8.4–10.5)
Chloride: 100 mEq/L (ref 96–112)
Creatinine, Ser: 0.98 mg/dL (ref 0.40–1.20)
GFR: 59.38 mL/min — ABNORMAL LOW (ref >60.00)
GLUCOSE: 99 mg/dL (ref 70–99)
Potassium: 3.7 mEq/L (ref 3.5–5.1)
SODIUM: 139 meq/L (ref 135–145)
Total Protein: 6.3 g/dL (ref 6.0–8.3)

## 2017-10-04 LAB — CBC WITH DIFFERENTIAL/PLATELET
BASOS ABS: 0.1 10*3/uL (ref 0.0–0.1)
Basophils Relative: 1 % (ref 0.0–3.0)
EOS ABS: 0.2 10*3/uL (ref 0.0–0.7)
Eosinophils Relative: 3 % (ref 0.0–5.0)
HCT: 37.8 % (ref 36.0–46.0)
Hemoglobin: 12.6 g/dL (ref 12.0–15.0)
LYMPHS ABS: 2.3 10*3/uL (ref 0.7–4.0)
Lymphocytes Relative: 27.5 % (ref 12.0–46.0)
MCHC: 33.4 g/dL (ref 30.0–36.0)
MCV: 88.3 fl (ref 78.0–100.0)
MONO ABS: 0.5 10*3/uL (ref 0.1–1.0)
MONOS PCT: 5.9 % (ref 3.0–12.0)
NEUTROS PCT: 62.6 % (ref 43.0–77.0)
Neutro Abs: 5.1 10*3/uL (ref 1.4–7.7)
Platelets: 266 10*3/uL (ref 150.0–400.0)
RBC: 4.28 Mil/uL (ref 3.87–5.11)
RDW: 13.8 % (ref 11.5–15.5)
WBC: 8.2 10*3/uL (ref 4.0–10.5)

## 2017-10-05 ENCOUNTER — Other Ambulatory Visit: Payer: Self-pay

## 2017-10-05 ENCOUNTER — Ambulatory Visit: Payer: Self-pay

## 2017-10-05 ENCOUNTER — Emergency Department (HOSPITAL_BASED_OUTPATIENT_CLINIC_OR_DEPARTMENT_OTHER)
Admission: EM | Admit: 2017-10-05 | Discharge: 2017-10-05 | Disposition: A | Discharge status: Home / Self Care | Payer: Medicare Other | Attending: Emergency Medicine | Admitting: Emergency Medicine

## 2017-10-05 ENCOUNTER — Encounter (HOSPITAL_BASED_OUTPATIENT_CLINIC_OR_DEPARTMENT_OTHER): Payer: Self-pay | Admitting: *Deleted

## 2017-10-05 DIAGNOSIS — M79651 Pain in right thigh: Secondary | ICD-10-CM | POA: Insufficient documentation

## 2017-10-05 DIAGNOSIS — R1031 Right lower quadrant pain: Secondary | ICD-10-CM

## 2017-10-05 DIAGNOSIS — Z79899 Other long term (current) drug therapy: Secondary | ICD-10-CM | POA: Insufficient documentation

## 2017-10-05 DIAGNOSIS — I10 Essential (primary) hypertension: Secondary | ICD-10-CM | POA: Insufficient documentation

## 2017-10-05 DIAGNOSIS — Z7982 Long term (current) use of aspirin: Secondary | ICD-10-CM | POA: Insufficient documentation

## 2017-10-05 LAB — URINALYSIS, ROUTINE W REFLEX MICROSCOPIC
Bilirubin Urine: NEGATIVE
Glucose, UA: NEGATIVE mg/dL
Hgb urine dipstick: NEGATIVE
Ketones, ur: NEGATIVE mg/dL
LEUKOCYTES UA: NEGATIVE
Nitrite: NEGATIVE
PH: 6 (ref 5.0–8.0)
Protein, ur: NEGATIVE mg/dL

## 2017-10-05 LAB — CBC WITH DIFFERENTIAL/PLATELET
BASOS ABS: 0 10*3/uL (ref 0.0–0.1)
Basophils Relative: 1 %
EOS PCT: 2 %
Eosinophils Absolute: 0.1 10*3/uL (ref 0.0–0.7)
HEMATOCRIT: 38.4 % (ref 36.0–46.0)
HEMOGLOBIN: 12.5 g/dL (ref 12.0–15.0)
Lymphocytes Relative: 25 %
Lymphs Abs: 1.8 10*3/uL (ref 0.7–4.0)
MCH: 29.5 pg (ref 26.0–34.0)
MCHC: 32.6 g/dL (ref 30.0–36.0)
MCV: 90.6 fL (ref 78.0–100.0)
MONO ABS: 0.4 10*3/uL (ref 0.1–1.0)
MONOS PCT: 6 %
Neutro Abs: 5 10*3/uL (ref 1.7–7.7)
Neutrophils Relative %: 66 %
Platelets: 255 10*3/uL (ref 150–400)
RBC: 4.24 MIL/uL (ref 3.87–5.11)
RDW: 13.6 % (ref 11.5–15.5)
WBC: 7.4 10*3/uL (ref 4.0–10.5)

## 2017-10-05 LAB — COMPREHENSIVE METABOLIC PANEL
ALBUMIN: 3.7 g/dL (ref 3.5–5.0)
ALK PHOS: 95 U/L (ref 38–126)
ALT: 32 U/L (ref 0–44)
AST: 34 U/L (ref 15–41)
Anion gap: 8 (ref 5–15)
BILIRUBIN TOTAL: 0.5 mg/dL (ref 0.3–1.2)
BUN: 13 mg/dL (ref 8–23)
CALCIUM: 9.1 mg/dL (ref 8.9–10.3)
CO2: 28 mmol/L (ref 22–32)
Chloride: 106 mmol/L (ref 98–111)
Creatinine, Ser: 0.92 mg/dL (ref 0.44–1.00)
GFR calc Af Amer: >60 mL/min (ref >60)
GFR calc non Af Amer: >60 mL/min (ref >60)
GLUCOSE: 125 mg/dL — AB (ref 70–99)
Potassium: 3.4 mmol/L — ABNORMAL LOW (ref 3.5–5.1)
SODIUM: 142 mmol/L (ref 135–145)
TOTAL PROTEIN: 6.5 g/dL (ref 6.5–8.1)

## 2017-10-05 MED ORDER — METHYLPREDNISOLONE 4 MG PO TBPK: ORAL_TABLET | ORAL | 0 refills | Status: DC

## 2017-10-05 MED FILL — METHYLPREDNISOLONE 4 MG TAB: 4 | 6 days supply | Qty: 21 | Fill #0

## 2017-10-05 NOTE — ED Triage Notes (Addendum)
C/o right lower abd pain x 2 weeks , seen BY pmd x 3 dasy ago , with ct scan done , pt states po Toradol is not working

## 2017-10-05 NOTE — ED Provider Notes (Signed)
[facility] Provider Note   CSN: [id number] Arrival date & time: 10/05/17  1404     History   Chief Complaint Chief Complaint  Patient presents with  . Abdominal Pain    HPI HANAE Byrd is a 72 y.o. female who presents the emergency department with chief complaint of right lower quadrant abdominal pain and upper thigh pain.  She had onset of the symptoms about 2 weeks ago.  The pain has been progressively worsening.  She saw her primary care physician 2 days ago and had a CT scan of the abdomen that was negative except for mild lymphadenopathy which was nonspecific.  She has not had any diarrhea nausea vomiting constipation.  She denies urinary symptoms.  CT scan did show degenerative disc disease of the lumbar spine.  The patient describes the pain in her right lower quadrant as sharp.  She states that it is worse if she has been standing or sitting for long periods of time and she does not have pain when she lies flat.  She denies any bulging or palpable hernias.  She denies numbness tingling or weakness in the lower extremity.  She has been taking tramadol and states that it made her feel like she was "in a dream."  HPI  Past Medical History:  Diagnosis Date  . Anemia   . Arthritis   . Cervical cancer ([place]) 1977  . Diverticulosis   . Endometriosis   . Esophageal stricture   . Gastritis   . GERD (gastroesophageal reflux disease)   . Glaucoma   . Herpes simplex type 1 infection   . Hiatal hernia   . History of colon polyps   . History of gallstones   . Hypertension   . IBS (irritable bowel syndrome)   . Insomnia   . Post-operative nausea and vomiting   . Restless leg syndrome     Patient Active Problem List   Diagnosis Date Noted  . Restless legs syndrome (RLS) 08/29/2016  . Lumbar radiculopathy 08/29/2016  . Obesity (BMI 30-39.9) 02/23/2016  . Localized swelling, mass and lump, neck 02/23/2016  . Neuropathy 08/13/2012  . Essential  hypertension 02/05/2009  . ARTHRITIS 02/05/2009  . Dysphagia 02/05/2009  . ABDOMINAL BLOATING 02/05/2009  . ABDOMINAL PAIN, LEFT LOWER QUADRANT 02/05/2009  . ESOPHAGEAL STRICTURE 02/04/2009  . GERD 02/04/2009  . GASTRITIS 02/04/2009  . HIATAL HERNIA 02/04/2009  . DIVERTICULOSIS OF COLON 02/04/2009  . IRRITABLE BOWEL SYNDROME 02/04/2009  . ENDOMETRIOSIS 02/04/2009    Past Surgical History:  Procedure Laterality Date  . ABDOMINAL HYSTERECTOMY  1998  . APPENDECTOMY  1998  . CERVICAL CONE BIOPSY    . CHOLECYSTECTOMY  1980  . COLON RESECTION  2002  . COLON SURGERY  2003   removed 2 feet colon  . ESOPHAGEAL MANOMETRY N/A 04/24/2016   Procedure: ESOPHAGEAL MANOMETRY (EM);  Surgeon: Mauri Pole, MD;  Location: [facility];  Service: Endoscopy;  Laterality: N/A;  . HAND SURGERY     right/ thumb joints replaced both hands  . OOPHORECTOMY    . TUBAL LIGATION       OB History   None      Home Medications    Prior to Admission medications   Medication Sig Start Date End Date Taking? Authorizing Provider  aspirin 81 MG tablet Take 81 mg by mouth daily.    [provider]  cyclobenzaprine (FLEXERIL) 10 MG tablet Take 1 tablet (10 mg total) by mouth at bedtime.  06/08/17   [name], MD  Docusate Sodium (STOOL SOFTENER) 100 MG capsule Take 100 mg by mouth at bedtime.    [provider]  famotidine (PEPCID) 20 MG tablet Take 1 tablet (20 mg total) by mouth at bedtime. As needed for stomach acid 08/21/16   Mauri Pole, MD  ferrous sulfate 325 (65 FE) MG tablet Take one po TID Patient not taking: Reported on 10/03/2017 02/05/12   Lafayette Dragon, MD  furosemide (LASIX) 20 MG tablet Take 1 tablet (20 mg total) by mouth daily. 01/05/17   [name], MD  gabapentin (NEURONTIN) 300 MG capsule Take 2 capsules (600 mg total) by mouth 2 (two) times daily. 10/02/17   Penumalli, Earlean Polka, MD  latanoprost (XALATAN) 0.005 % ophthalmic solution Place 1  drop into both eyes at bedtime.  10/29/13   [provider]  losartan-hydrochlorothiazide (HYZAAR) 50-12.5 MG tablet TAKE 1 TABLET BY MOUTH EVERY DAY 03/19/17   [name], MD  methylPREDNISolone (MEDROL DOSEPAK) 4 MG TBPK tablet Use as directed 10/05/17   Margarita Mail, PA-C  metoprolol succinate (TOPROL-XL) 100 MG 24 hr tablet TAKE 1 TABLET BY MOUTH DAILY. NO FURTHER REFILLS WITHOUT AN APPT. 07/16/17   [name], MD  omeprazole (PRILOSEC) 40 MG capsule Take 1 capsule (40 mg total) by mouth 2 (two) times daily. 08/21/16   Mauri Pole, MD  pramipexole (MIRAPEX) 1 MG tablet Take 1 tablet (1 mg total) by mouth 2 (two) times daily. 10/02/17   Penumalli, Earlean Polka, MD  Wheat Dextrin (BENEFIBER) POWD Take 1 tablespoon three times a day Patient not taking: Reported on 10/03/2017 10/10/16   Mauri Pole, MD    Family History Family History  Problem Relation Age of Onset  . Diabetes Mother   . Stroke Mother        brother, sister, MGM  . Colon polyps Mother   . Heart disease Father   . Kidney disease Father   . Colon polyps Father   . Lung disease Father   . Kidney cancer Sister   . Stroke Sister   . Diabetes Sister   . Colon polyps Sister   . Celiac disease Other        niece  . Heart attack Paternal Grandfather   . Colon cancer Neg Hx   . Esophageal cancer Neg Hx   . Rectal cancer Neg Hx   . Stomach cancer Neg Hx     Social History Social History   Tobacco Use  . Smoking status: Never Smoker  . Smokeless tobacco: Never Used  Substance Use Topics  . Alcohol use: No  . Drug use: No     Allergies   Nitrofurantoin; Propoxyphene n-acetaminophen; Dexilant [dexlansoprazole]; Nexium [esomeprazole magnesium]; Nortriptyline hcl; Hydrocodone-homatropine; and Morphine   Review of Systems Review of Systems  Ten systems reviewed and are negative for acute change, except as noted in the HPI.   Physical Exam Updated Vital Signs BP 110/62 (BP  Location: Right Arm)   Pulse 82   Temp 98.2 F (36.8 C) (Oral)   Resp 16   Ht 5\' 3"  (1.6 m)   Wt 85.3 kg (188 lb)   SpO2 100%   BMI 33.30 kg/m   Physical Exam  Constitutional: She is oriented to person, place, and time. She appears well-developed and well-nourished. No distress.  HENT:  Head: Normocephalic and atraumatic.  Eyes: Conjunctivae are normal. No scleral icterus.  Neck: Normal range of motion.  Cardiovascular: Normal rate, regular rhythm and normal heart sounds. Exam reveals no gallop and no friction rub.  No murmur heard. Pulmonary/Chest: Effort normal and breath sounds normal. No respiratory distress.  Abdominal: Soft. Bowel sounds are normal. She exhibits no distension and no mass. There is no tenderness. There is no guarding.  Neurological: She is alert and oriented to person, place, and time.  Skin: Skin is warm and dry. She is not diaphoretic.  Psychiatric: Her behavior is normal.  Nursing note and vitals reviewed.    ED Treatments / Results  Labs (all labs ordered are listed, but only abnormal results are displayed) Labs Reviewed  URINALYSIS, ROUTINE W REFLEX MICROSCOPIC - Abnormal; Notable for the following components:      Result Value   Specific Gravity, Urine <1.005 (*)    All other components within normal limits  COMPREHENSIVE METABOLIC PANEL - Abnormal; Notable for the following components:   Potassium 3.4 (*)    Glucose, Bld 125 (*)    All other components within normal limits  CBC WITH DIFFERENTIAL/PLATELET    EKG None  Radiology No results found.  Procedures Procedures (including critical care time)  Medications Ordered in ED Medications - No data to display   Initial Impression / Assessment and Plan / ED Course  I have reviewed the triage vital signs and the nursing notes.  Pertinent labs & imaging results that were available during my care of the patient were reviewed by me and considered in my medical decision making (see chart  for details).     Patient work-up without any significant abnormality.  Her glucose is only slightly elevated potassium is slightly low.  She does not have a history of diabetes.  Urine is clear.  I have high suspicion for musculoskeletal etiology including radiculopathy as patient is sharp pain radiating into the thigh.  She was CT scan was -2 days ago no evidence of a kidney stone and again history and physical exam does not denote this.  Her abdominal exam is benign and I do not feel there is any intra-abdominal etiology of her pain.  Patient will be given a Medrol Dosepak for treatment of potential radiculopathy and will need outpatient MRI or surgical follow-up for potential hernia.  Patient appears appropriate for discharge at this time and is ambulatory.  Final Clinical Impressions(s) / ED Diagnoses   Final diagnoses:  Pain of right lateral upper thigh  Right lower quadrant abdominal pain    ED Discharge Orders        Ordered    methylPREDNISolone (MEDROL DOSEPAK) 4 MG TBPK tablet     10/05/17 1653       Margarita Mail, PA-C 10/05/17 1915    Malvin Johns, MD 10/06/17 [id number]

## 2017-10-05 NOTE — ED Notes (Addendum)
Denies lifting anything heavt and denies dysuria, has had full hystorectomy, states was told by her dr that CT was neg, pain  Is constant she states waxes and wanes but still there

## 2017-10-05 NOTE — ED Notes (Signed)
ED Provider at bedside. 

## 2017-10-05 NOTE — Telephone Encounter (Signed)
Pt. Reports her pain has increased and now she has pain in her right thigh. States the contrast she drank for the CT scan "cleaned me out." Denies any other symptoms. Levada Dy spoke with Mr. Hassell Done who recommends pt. Be evaluated in ED. Pt. Verbalizes understanding. Will have a friend take her. Reason for Disposition . [1] MODERATE pain (e.g., interferes with normal activities) AND [2] pain comes and goes (cramps) AND [3] present > 24 hours  (Exception: pain with Vomiting or Diarrhea - see that Guideline) . Patient sounds very sick or weak to the triager  Answer Assessment - Initial Assessment Questions 1. LOCATION: "Where does it hurt?"      Right  Lower quad 2. RADIATION: "Does the pain shoot anywhere else?" (e.g., chest, back)     Shoots into right thigh 3. ONSET: "When did the pain begin?" (e.g., minutes, hours or days ago)      Started 1 week ago 4. SUDDEN: "Gradual or sudden onset?"     Getting worse 5. PATTERN "Does the pain come and go, or is it constant?"    - If constant: "Is it getting better, staying the same, or worsening?"      (Note: Constant means the pain never goes away completely; most serious pain is constant and it progresses)     - If intermittent: "How long does it last?" "Do you have pain now?"     (Note: Intermittent means the pain goes away completely between bouts)     Constant 6. SEVERITY: "How bad is the pain?"  (e.g., Scale 1-10; mild, moderate, or severe)   - MILD (1-3): doesn't interfere with normal activities, abdomen soft and not tender to touch    - MODERATE (4-7): interferes with normal activities or awakens from sleep, tender to touch    - SEVERE (8-10): excruciating pain, doubled over, unable to do any normal activities       8-9 7. RECURRENT SYMPTOM: "Have you ever had this type of abdominal pain before?" If so, ask: "When was the last time?" and "What happened that time?"      No 8. CAUSE: "What do you think is causing the abdominal pain?"      Unsure 9. RELIEVING/AGGRAVATING FACTORS: "What makes it better or worse?" (e.g., movement, antacids, bowel movement)     No 10. OTHER SYMPTOMS: "Has there been any vomiting, diarrhea, constipation, or urine problems?"       No 11. PREGNANCY: "Is there any chance you are pregnant?" "When was your last menstrual period?"       No  Protocols used: ABDOMINAL PAIN - [facility]

## 2017-10-05 NOTE — Discharge Instructions (Addendum)
You may need to follow up for an MRI of your lumbar spine or have an evaluation by a surgeon for  Get help right away if: Your develop severe pain. Your pain suddenly gets worse. You develop increasing weakness in your legs. You lose the ability to control your bladder or bowel. You have difficulty walking or balancing. You have a fever.

## 2017-10-08 DIAGNOSIS — M5432 Sciatica, left side: Secondary | ICD-10-CM | POA: Diagnosis not present

## 2017-10-08 DIAGNOSIS — M9903 Segmental and somatic dysfunction of lumbar region: Secondary | ICD-10-CM | POA: Diagnosis not present

## 2017-10-08 DIAGNOSIS — M5431 Sciatica, right side: Secondary | ICD-10-CM | POA: Diagnosis not present

## 2017-10-11 DIAGNOSIS — M5431 Sciatica, right side: Secondary | ICD-10-CM | POA: Diagnosis not present

## 2017-10-11 DIAGNOSIS — M5432 Sciatica, left side: Secondary | ICD-10-CM | POA: Diagnosis not present

## 2017-10-11 DIAGNOSIS — M9903 Segmental and somatic dysfunction of lumbar region: Secondary | ICD-10-CM | POA: Diagnosis not present

## 2017-10-16 DIAGNOSIS — M5431 Sciatica, right side: Secondary | ICD-10-CM | POA: Diagnosis not present

## 2017-10-16 DIAGNOSIS — M9903 Segmental and somatic dysfunction of lumbar region: Secondary | ICD-10-CM | POA: Diagnosis not present

## 2017-10-16 DIAGNOSIS — M5432 Sciatica, left side: Secondary | ICD-10-CM | POA: Diagnosis not present

## 2017-10-17 ENCOUNTER — Other Ambulatory Visit: Payer: Self-pay | Admitting: Family Medicine

## 2017-10-18 ENCOUNTER — Ambulatory Visit (INDEPENDENT_AMBULATORY_CARE_PROVIDER_SITE_OTHER): Payer: Medicare Other | Admitting: Family Medicine

## 2017-10-18 ENCOUNTER — Encounter: Payer: Self-pay | Admitting: Family Medicine

## 2017-10-18 ENCOUNTER — Other Ambulatory Visit: Payer: Self-pay

## 2017-10-18 VITALS — BP 124/72 | HR 76 | Temp 98.1°F | Resp 16 | Ht 63.0 in | Wt 188.4 lb

## 2017-10-18 DIAGNOSIS — M5416 Radiculopathy, lumbar region: Secondary | ICD-10-CM

## 2017-10-18 MED ORDER — TRAMADOL HCL 50 MG PO TABS: 50.0000 mg | ORAL_TABLET | Freq: Three times a day (TID) | ORAL | 0 refills | Status: DC | PRN

## 2017-10-18 MED ORDER — PREDNISONE 10 MG PO TABS: ORAL_TABLET | ORAL | 0 refills | Status: DC

## 2017-10-18 NOTE — Progress Notes (Signed)
   Subjective:    Patient ID: Andrea Byrd, female    DOB: Apr 26, 1945, 72 y.o.   MRN: [id number]  HPI ER f/u- pt had OV w/ Elyn Aquas on 7/31 for RLQ pain.  Labs and CT scan were done.  Labs unremarkable.  CT showed nonspecific LAD and multilevel lumbar degeneration.  Went to ER on 8/2 and they felt it was musculoskeletal/ radiculopathy.  + relief w/ Tramadol.  Was d/c'd from hospital on Methylprednisolone dose pack.  No pain w/ lying down.  Hurts to sit, hurts to walk.  Pain radiates down anterior R leg to knee and around to the back.  Pain started in abdomen but is now strongest in the back.  Pt has been doing to Dr Owens Shark (Chiropractor)  Reviewed OV from 7/31, ER notes from 8/2, imaging and labs.  Review of Systems For ROS see HPI     Objective:   Physical Exam  Constitutional: She is oriented to person, place, and time. She appears well-developed and well-nourished. No distress.  HENT:  Head: Normocephalic and atraumatic.  Abdominal: Soft. Bowel sounds are normal. She exhibits no distension and no mass. There is no tenderness. There is no rebound and no guarding.  Musculoskeletal: She exhibits tenderness (TTP over lumbar spine).  Neurological: She is alert and oriented to person, place, and time. She displays normal reflexes. No cranial nerve deficit. She exhibits normal muscle tone. Coordination normal.  (-) SLR bilaterally  Skin: Skin is warm and dry. No rash noted.  Vitals reviewed.         Assessment & Plan:  Lumbar radiculopathy- new to provider, ongoing for pt since late July.  Reviewed CT scan, labs- no intrabdominal concerns.  Pt's abdominal pain has actually improved and pain is now localized to her back.  CT showed lumbar degeneration.  Refer to ortho.  Start Prednisone.  Refill pain meds.  Reviewed supportive care and red flags that should prompt return.  Pt expressed understanding and is in agreement w/ plan.

## 2017-10-18 NOTE — Patient Instructions (Signed)
Follow up as needed or as scheduled START the Prednisone as directed- take w/ food Use the Tramadol as needed for severe pain Alternate ice/heat whichever feels better We'll call you with your Ortho appt Call with any questions or concerns Hang in there!!

## 2017-10-22 DIAGNOSIS — M545 Low back pain: Secondary | ICD-10-CM | POA: Diagnosis not present

## 2017-10-22 DIAGNOSIS — M25551 Pain in right hip: Secondary | ICD-10-CM | POA: Diagnosis not present

## 2017-10-31 ENCOUNTER — Other Ambulatory Visit: Payer: Self-pay | Admitting: Family Medicine

## 2017-11-07 DIAGNOSIS — M25551 Pain in right hip: Secondary | ICD-10-CM | POA: Diagnosis not present

## 2017-11-10 DIAGNOSIS — M545 Low back pain: Secondary | ICD-10-CM | POA: Diagnosis not present

## 2017-11-13 DIAGNOSIS — M5416 Radiculopathy, lumbar region: Secondary | ICD-10-CM | POA: Diagnosis not present

## 2017-11-13 DIAGNOSIS — M545 Low back pain: Secondary | ICD-10-CM | POA: Diagnosis not present

## 2017-11-15 ENCOUNTER — Other Ambulatory Visit: Payer: Self-pay | Admitting: Gastroenterology

## 2017-11-23 DIAGNOSIS — M545 Low back pain: Secondary | ICD-10-CM | POA: Diagnosis not present

## 2017-11-23 DIAGNOSIS — M5416 Radiculopathy, lumbar region: Secondary | ICD-10-CM | POA: Diagnosis not present

## 2017-11-28 ENCOUNTER — Other Ambulatory Visit: Payer: Self-pay | Admitting: Family Medicine

## 2017-12-06 DIAGNOSIS — H401232 Low-tension glaucoma, bilateral, moderate stage: Secondary | ICD-10-CM | POA: Diagnosis not present

## 2017-12-11 DIAGNOSIS — M47816 Spondylosis without myelopathy or radiculopathy, lumbar region: Secondary | ICD-10-CM | POA: Diagnosis not present

## 2017-12-11 DIAGNOSIS — M545 Low back pain: Secondary | ICD-10-CM | POA: Diagnosis not present

## 2018-01-11 DIAGNOSIS — Z23 Encounter for immunization: Secondary | ICD-10-CM | POA: Diagnosis not present

## 2018-01-13 ENCOUNTER — Other Ambulatory Visit: Payer: Self-pay | Admitting: Family Medicine

## 2018-01-21 ENCOUNTER — Other Ambulatory Visit: Payer: Self-pay | Admitting: Family Medicine

## 2018-01-21 DIAGNOSIS — Z1231 Encounter for screening mammogram for malignant neoplasm of breast: Secondary | ICD-10-CM

## 2018-01-22 DIAGNOSIS — M5416 Radiculopathy, lumbar region: Secondary | ICD-10-CM | POA: Diagnosis not present

## 2018-01-22 DIAGNOSIS — M545 Low back pain: Secondary | ICD-10-CM | POA: Diagnosis not present

## 2018-01-22 DIAGNOSIS — M47816 Spondylosis without myelopathy or radiculopathy, lumbar region: Secondary | ICD-10-CM | POA: Diagnosis not present

## 2018-01-28 DIAGNOSIS — M25562 Pain in left knee: Secondary | ICD-10-CM | POA: Diagnosis not present

## 2018-02-04 ENCOUNTER — Other Ambulatory Visit: Payer: Self-pay

## 2018-02-04 ENCOUNTER — Encounter: Payer: Self-pay | Admitting: Family Medicine

## 2018-02-04 ENCOUNTER — Ambulatory Visit (INDEPENDENT_AMBULATORY_CARE_PROVIDER_SITE_OTHER): Payer: Medicare Other | Admitting: Family Medicine

## 2018-02-04 VITALS — BP 112/68 | HR 90 | Temp 98.2°F | Resp 16 | Ht 63.0 in | Wt 188.5 lb

## 2018-02-04 DIAGNOSIS — I10 Essential (primary) hypertension: Secondary | ICD-10-CM

## 2018-02-04 DIAGNOSIS — E669 Obesity, unspecified: Secondary | ICD-10-CM

## 2018-02-04 DIAGNOSIS — H61893 Other specified disorders of external ear, bilateral: Secondary | ICD-10-CM | POA: Diagnosis not present

## 2018-02-04 MED ORDER — FLUOCINOLONE ACETONIDE 0.01 % OT OIL: 5.0000 [drp] | TOPICAL_OIL | Freq: Two times a day (BID) | OTIC | 1 refills | Status: DC

## 2018-02-04 NOTE — Addendum Note (Signed)
Addended by: Davis Gourd on: 02/04/2018 11:16 AM   Modules accepted: Orders

## 2018-02-04 NOTE — Assessment & Plan Note (Signed)
Ongoing issue for pt.  Stressed need for healthy diet and regular exercise.  Check labs to risk stratify.  Will follow 

## 2018-02-04 NOTE — Assessment & Plan Note (Signed)
Chronic problem.  Excellent control today.  Asymptomatic.  Check labs.  No anticipated med changes.  Will follow.

## 2018-02-04 NOTE — Patient Instructions (Addendum)
Follow up in 6 months to recheck BP We'll notify you of your lab results and make any changes if needed Continue to take care of yourself!  You deserve it! USE the Dermotic in the ears as needed for itching Continue to work on healthy diet and regular exercise Call with any questions or concerns Happy Holidays!

## 2018-02-04 NOTE — Progress Notes (Signed)
   Subjective:    Patient ID: Andrea Byrd, female    DOB: 05-18-1945, 72 y.o.   MRN: [id number]  HPI HTN- chronic problem, on Losartan HCTZ 50/12.5mg  daily and Metoprolol 100mg  daily.  No CP, SOB, HAs, visual changes, edema.  Obesity- pt's BMI is 33.4  Pt has not been able to exercise or take care of herself b/c husband recently had a stroke.  Itchy ears- pt reports they are waking her at night, will have to use a qtip and baby oil to relief sxs  UTD on flu shot, mammo is scheduled  Review of Systems For ROS see HPI     Objective:   Physical Exam  Constitutional: She is oriented to person, place, and time. She appears well-developed and well-nourished. No distress.  HENT:  Head: Normocephalic and atraumatic.  EACs dry bilaterally  Eyes: Pupils are equal, round, and reactive to light. Conjunctivae and EOM are normal.  Neck: Normal range of motion. Neck supple. No thyromegaly present.  Cardiovascular: Normal rate, regular rhythm, normal heart sounds and intact distal pulses.  No murmur heard. Pulmonary/Chest: Effort normal and breath sounds normal. No respiratory distress.  Abdominal: Soft. She exhibits no distension. There is no tenderness.  Musculoskeletal: She exhibits no edema.  Lymphadenopathy:    She has no cervical adenopathy.  Neurological: She is alert and oriented to person, place, and time.  Skin: Skin is warm and dry.  Psychiatric: She has a normal mood and affect. Her behavior is normal.  Vitals reviewed.         Assessment & Plan:  Ear canal dryness- new.  Start Dermotic to relieve ear itching.  Will follow.

## 2018-02-05 ENCOUNTER — Other Ambulatory Visit (INDEPENDENT_AMBULATORY_CARE_PROVIDER_SITE_OTHER): Payer: Medicare Other

## 2018-02-05 DIAGNOSIS — I10 Essential (primary) hypertension: Secondary | ICD-10-CM | POA: Diagnosis not present

## 2018-02-05 LAB — BASIC METABOLIC PANEL
BUN: 17 mg/dL (ref 6–23)
CALCIUM: 9.5 mg/dL (ref 8.4–10.5)
CO2: 31 mEq/L (ref 19–32)
Chloride: 103 mEq/L (ref 96–112)
Creatinine, Ser: 0.92 mg/dL (ref 0.40–1.20)
GFR: 63.81 mL/min (ref >60.00)
Glucose, Bld: 107 mg/dL — ABNORMAL HIGH (ref 70–99)
Potassium: 3.8 mEq/L (ref 3.5–5.1)
Sodium: 140 mEq/L (ref 135–145)

## 2018-02-05 LAB — CBC WITH DIFFERENTIAL/PLATELET
BASOS PCT: 0.9 % (ref 0.0–3.0)
Basophils Absolute: 0.1 10*3/uL (ref 0.0–0.1)
Eosinophils Absolute: 0.1 10*3/uL (ref 0.0–0.7)
Eosinophils Relative: 1.3 % (ref 0.0–5.0)
HCT: 41 % (ref 36.0–46.0)
Hemoglobin: 13.5 g/dL (ref 12.0–15.0)
Lymphocytes Relative: 31.7 % (ref 12.0–46.0)
Lymphs Abs: 2.6 10*3/uL (ref 0.7–4.0)
MCHC: 33 g/dL (ref 30.0–36.0)
MCV: 89.3 fl (ref 78.0–100.0)
MONOS PCT: 6 % (ref 3.0–12.0)
Monocytes Absolute: 0.5 10*3/uL (ref 0.1–1.0)
NEUTROS PCT: 60.1 % (ref 43.0–77.0)
Neutro Abs: 4.9 10*3/uL (ref 1.4–7.7)
Platelets: 333 10*3/uL (ref 150.0–400.0)
RBC: 4.59 Mil/uL (ref 3.87–5.11)
RDW: 14.9 % (ref 11.5–15.5)
WBC: 8.1 10*3/uL (ref 4.0–10.5)

## 2018-02-05 LAB — HEPATIC FUNCTION PANEL
ALT: 22 U/L (ref 0–35)
AST: 19 U/L (ref 0–37)
Albumin: 4.1 g/dL (ref 3.5–5.2)
Alkaline Phosphatase: 101 U/L (ref 39–117)
BILIRUBIN TOTAL: 0.4 mg/dL (ref 0.2–1.2)
Bilirubin, Direct: 0.1 mg/dL (ref 0.0–0.3)
Total Protein: 7 g/dL (ref 6.0–8.3)

## 2018-02-05 LAB — TSH: TSH: 1.74 u[IU]/mL (ref 0.35–4.50)

## 2018-02-05 LAB — LIPID PANEL
Cholesterol: 158 mg/dL (ref 0–200)
HDL: 39 mg/dL — ABNORMAL LOW (ref >39.00)
LDL Cholesterol: 94 mg/dL (ref 0–99)
NonHDL: 119.44
Total CHOL/HDL Ratio: 4
Triglycerides: 125 mg/dL (ref 0.0–149.0)
VLDL: 25 mg/dL (ref 0.0–40.0)

## 2018-03-01 ENCOUNTER — Encounter: Payer: Self-pay | Admitting: Family Medicine

## 2018-03-01 ENCOUNTER — Ambulatory Visit (INDEPENDENT_AMBULATORY_CARE_PROVIDER_SITE_OTHER): Payer: Medicare Other | Admitting: Family Medicine

## 2018-03-01 VITALS — BP 147/80 | HR 100 | Temp 98.6°F | Resp 16 | Ht 63.0 in | Wt 193.4 lb

## 2018-03-01 DIAGNOSIS — R6889 Other general symptoms and signs: Secondary | ICD-10-CM | POA: Diagnosis not present

## 2018-03-01 DIAGNOSIS — B9789 Other viral agents as the cause of diseases classified elsewhere: Secondary | ICD-10-CM

## 2018-03-01 DIAGNOSIS — J069 Acute upper respiratory infection, unspecified: Secondary | ICD-10-CM

## 2018-03-01 DIAGNOSIS — G44219 Episodic tension-type headache, not intractable: Secondary | ICD-10-CM

## 2018-03-01 LAB — POC INFLUENZA A&B (BINAX/QUICKVUE)
Influenza A, POC: NEGATIVE
Influenza B, POC: NEGATIVE

## 2018-03-01 MED ORDER — BUTALBITAL-APAP-CAFFEINE 50-325-40 MG PO TABS: ORAL_TABLET | ORAL | 0 refills | Status: DC

## 2018-03-01 MED ORDER — BENZONATATE 200 MG PO CAPS: 200.0000 mg | ORAL_CAPSULE | Freq: Three times a day (TID) | ORAL | 0 refills | Status: DC | PRN

## 2018-03-01 NOTE — Progress Notes (Signed)
OFFICE VISIT  03/01/2018   CC:  Chief Complaint  Patient presents with  . Cough   HPI:    Patient is a 72 y.o. Caucasian female who presents for cough. Onset this morning (about 10 hours ago).  Some ST, some PND, bad HA, mildly achy.  No fever.  No wheezing or SOB. +Fatigued.  No n/v/d.   She did get her flu vaccine.  No otc meds tried.  Past Medical History:  Diagnosis Date  . Anemia   . Arthritis   . Cervical cancer ([place]) 1977  . Diverticulosis   . Endometriosis   . Esophageal stricture   . Gastritis   . GERD (gastroesophageal reflux disease)   . Glaucoma   . Herpes simplex type 1 infection   . Hiatal hernia   . History of colon polyps   . History of gallstones   . Hypertension   . IBS (irritable bowel syndrome)   . Insomnia   . Post-operative nausea and vomiting   . Restless leg syndrome     Past Surgical History:  Procedure Laterality Date  . ABDOMINAL HYSTERECTOMY  1998  . APPENDECTOMY  1998  . CERVICAL CONE BIOPSY    . CHOLECYSTECTOMY  1980  . COLON RESECTION  2002  . COLON SURGERY  2003   removed 2 feet colon  . ESOPHAGEAL MANOMETRY N/A 04/24/2016   Procedure: ESOPHAGEAL MANOMETRY (EM);  Surgeon: Mauri Pole, MD;  Location: [facility];  Service: Endoscopy;  Laterality: N/A;  . HAND SURGERY     right/ thumb joints replaced both hands  . OOPHORECTOMY    . TUBAL LIGATION      Outpatient Medications Prior to Visit  Medication Sig Dispense Refill  . aspirin 81 MG tablet Take 81 mg by mouth daily.    . cyclobenzaprine (FLEXERIL) 10 MG tablet TAKE 1 TABLET BY MOUTH AT BEDTIME 30 tablet 3  . famotidine (PEPCID) 20 MG tablet TAKE 1 TABLET (20 MG TOTAL) BY MOUTH AT BEDTIME. AS NEEDED FOR STOMACH ACID 90 tablet 4  . ferrous sulfate 325 (65 FE) MG tablet Take one po TID 90 tablet 3  . Fluocinolone Acetonide (DERMOTIC) 0.01 % OIL Place 5 drops in ear(s) 2 (two) times daily. 20 mL 1  . furosemide (LASIX) 20 MG tablet Take 1 tablet (20 mg total) by mouth  daily. 30 tablet 3  . gabapentin (NEURONTIN) 300 MG capsule Take 2 capsules (600 mg total) by mouth 2 (two) times daily. 360 capsule 4  . latanoprost (XALATAN) 0.005 % ophthalmic solution Place 1 drop into both eyes at bedtime.     Marland Kitchen losartan-hydrochlorothiazide (HYZAAR) 50-12.5 MG tablet TAKE 1 TABLET BY MOUTH EVERY DAY 90 tablet 1  . metoprolol succinate (TOPROL-XL) 100 MG 24 hr tablet TAKE 1 TABLET BY MOUTH DAILY. NO FURTHER REFILLS WITHOUT AN APPT. 90 tablet 0  . omeprazole (PRILOSEC) 40 MG capsule Take 1 capsule (40 mg total) by mouth 2 (two) times daily. 120 capsule 3  . pramipexole (MIRAPEX) 1 MG tablet Take 1 tablet (1 mg total) by mouth 2 (two) times daily. 180 tablet 4  . Wheat Dextrin (BENEFIBER) POWD Take 1 tablespoon three times a day  0   No facility-administered medications prior to visit.     Allergies  Allergen Reactions  . Nitrofurantoin     Makes mouth break out/blisters  . Propoxyphene N-Acetaminophen     REACTION: nausea  . Dexilant [Dexlansoprazole] Nausea And Vomiting  . Nexium [Esomeprazole Magnesium] Nausea And  Vomiting  . Nortriptyline Hcl Nausea And Vomiting  . Hydrocodone-Homatropine Itching and Rash  . Morphine Itching and Rash    ROS As per HPI  PE: Blood pressure (!) 147/80, pulse 100, temperature 98.6 F (37 C), temperature source Oral, resp. rate 16, height 5\' 3"  (1.6 m), weight 193 lb 6 oz (87.7 kg), SpO2 98 %. VS: noted--normal. Gen: alert, NAD, NONTOXIC APPEARING. HEENT: eyes without injection, drainage, or swelling.  Ears: EACs clear, TMs with normal light reflex and landmarks.  Nose: Clear rhinorrhea, with some dried, crusty exudate adherent to mildly injected mucosa.  No purulent d/c.  No paranasal sinus TTP.  No facial swelling.  Throat and mouth without focal lesion.  No pharyngial swelling, erythema, or exudate.   Neck: supple, no LAD.   LUNGS: CTA bilat, nonlabored resps.   CV: RRR, no m/r/g. EXT: no c/c/e SKIN: no rash    LABS:     Chemistry      Component Value Date/Time   NA 140 02/05/2018 0822   NA 141 10/11/2015   K 3.8 02/05/2018 0822   CL 103 02/05/2018 0822   CO2 31 02/05/2018 0822   BUN 17 02/05/2018 0822   BUN 13 10/11/2015   CREATININE 0.92 02/05/2018 0822   CREATININE 0.88 08/06/2017 1651   GLU 91 10/11/2015      Component Value Date/Time   CALCIUM 9.5 02/05/2018 0822   ALKPHOS 101 02/05/2018 0822   AST 19 02/05/2018 0822   ALT 22 02/05/2018 0822   BILITOT 0.4 02/05/2018 0822     Rapid flu A/B today: NEG  IMPRESSION AND PLAN:  Influenza-like illness; most prominent sx's being HA/body aches, cough. Symptomatic care discussed. Instructions: Get otc generic robitussin DM OR Mucinex DM and use as directed on the packaging for cough and congestion.  Compare this to the tessalon pearls that I prescribed and use whichever works best for your cough. Use otc generic saline nasal spray 2-3 times per day to irrigate/moisturize your nasal passages. I did rx fioricet 50/325/40, 1-2 tabs bid prn, #20, no RF for HA/body aches. Therapeutic expectations and side effect profile of medication discussed today.  Patient's questions answered. Push fluids, rest.  An After Visit Summary was printed and given to the patient.  FOLLOW UP: Return if symptoms worsen or fail to improve.  Signed:  Crissie Sickles, MD           03/01/2018

## 2018-03-01 NOTE — Patient Instructions (Addendum)
Get otc generic robitussin DM OR Mucinex DM and use as directed on the packaging for cough and congestion.  Compare this to the tessalon pearls that I prescribed and use whichever works best for your cough.  Use otc generic saline nasal spray 2-3 times per day to irrigate/moisturize your nasal passages.

## 2018-03-04 ENCOUNTER — Other Ambulatory Visit: Payer: Self-pay

## 2018-03-04 ENCOUNTER — Ambulatory Visit: Payer: Self-pay

## 2018-03-04 ENCOUNTER — Ambulatory Visit (INDEPENDENT_AMBULATORY_CARE_PROVIDER_SITE_OTHER): Payer: Medicare Other | Admitting: Physician Assistant

## 2018-03-04 ENCOUNTER — Encounter: Payer: Self-pay | Admitting: Physician Assistant

## 2018-03-04 VITALS — BP 114/68 | HR 59 | Temp 98.1°F | Resp 16 | Ht 63.0 in | Wt 189.0 lb

## 2018-03-04 DIAGNOSIS — J9801 Acute bronchospasm: Secondary | ICD-10-CM

## 2018-03-04 DIAGNOSIS — J069 Acute upper respiratory infection, unspecified: Secondary | ICD-10-CM | POA: Diagnosis not present

## 2018-03-04 DIAGNOSIS — B9789 Other viral agents as the cause of diseases classified elsewhere: Secondary | ICD-10-CM | POA: Diagnosis not present

## 2018-03-04 MED ORDER — PREDNISONE 10 MG PO TABS: ORAL_TABLET | ORAL | 0 refills | Status: AC

## 2018-03-04 MED ORDER — PROMETHAZINE-DM 6.25-15 MG/5ML PO SYRP: 5.0000 mL | ORAL_SOLUTION | Freq: Four times a day (QID) | ORAL | 0 refills | Status: DC | PRN

## 2018-03-04 NOTE — Progress Notes (Signed)
Patient presents to clinic today c/o continued dry cough despite supportive measures and Tessalon. Was seen 3 days ago by another provider, at which time she was diagnosed with a viral URI. Was instructed to start Tessalon and Mucinex. Flu testing at that time was negative. Denies any new or worsening symptoms. Cannot sleep due to cough..  Past Medical History:  Diagnosis Date  . Anemia   . Arthritis   . Cervical cancer ([place]) 1977  . Diverticulosis   . Endometriosis   . Esophageal stricture   . Gastritis   . GERD (gastroesophageal reflux disease)   . Glaucoma   . Herpes simplex type 1 infection   . Hiatal hernia   . History of colon polyps   . History of gallstones   . Hypertension   . IBS (irritable bowel syndrome)   . Insomnia   . Post-operative nausea and vomiting   . Restless leg syndrome     Current Outpatient Medications on File Prior to Visit  Medication Sig Dispense Refill  . aspirin 81 MG tablet Take 81 mg by mouth daily.    . butalbital-acetaminophen-caffeine (FIORICET, ESGIC) 50-325-40 MG tablet 1-2 tabs po bid prn headache/body aches 20 tablet 0  . cyclobenzaprine (FLEXERIL) 10 MG tablet TAKE 1 TABLET BY MOUTH AT BEDTIME 30 tablet 3  . famotidine (PEPCID) 20 MG tablet TAKE 1 TABLET (20 MG TOTAL) BY MOUTH AT BEDTIME. AS NEEDED FOR STOMACH ACID 90 tablet 4  . ferrous sulfate 325 (65 FE) MG tablet Take one po TID 90 tablet 3  . Fluocinolone Acetonide (DERMOTIC) 0.01 % OIL Place 5 drops in ear(s) 2 (two) times daily. 20 mL 1  . furosemide (LASIX) 20 MG tablet Take 1 tablet (20 mg total) by mouth daily. 30 tablet 3  . gabapentin (NEURONTIN) 300 MG capsule Take 2 capsules (600 mg total) by mouth 2 (two) times daily. 360 capsule 4  . latanoprost (XALATAN) 0.005 % ophthalmic solution Place 1 drop into both eyes at bedtime.     Marland Kitchen losartan-hydrochlorothiazide (HYZAAR) 50-12.5 MG tablet TAKE 1 TABLET BY MOUTH EVERY DAY 90 tablet 1  . metoprolol succinate (TOPROL-XL) 100 MG 24  hr tablet TAKE 1 TABLET BY MOUTH DAILY. NO FURTHER REFILLS WITHOUT AN APPT. 90 tablet 0  . omeprazole (PRILOSEC) 40 MG capsule Take 1 capsule (40 mg total) by mouth 2 (two) times daily. 120 capsule 3  . pramipexole (MIRAPEX) 1 MG tablet Take 1 tablet (1 mg total) by mouth 2 (two) times daily. 180 tablet 4  . Wheat Dextrin (BENEFIBER) POWD Take 1 tablespoon three times a day  0   No current facility-administered medications on file prior to visit.     Allergies  Allergen Reactions  . Nitrofurantoin     Makes mouth break out/blisters  . Propoxyphene N-Acetaminophen     REACTION: nausea  . Dexilant [Dexlansoprazole] Nausea And Vomiting  . Nexium [Esomeprazole Magnesium] Nausea And Vomiting  . Nortriptyline Hcl Nausea And Vomiting  . Hydrocodone-Homatropine Itching and Rash  . Morphine Itching and Rash    Family History  Problem Relation Age of Onset  . Diabetes Mother   . Stroke Mother        brother, sister, MGM  . Colon polyps Mother   . Heart disease Father   . Kidney disease Father   . Colon polyps Father   . Lung disease Father   . Kidney cancer Sister   . Stroke Sister   . Diabetes Sister   .  Colon polyps Sister   . Celiac disease Other        niece  . Heart attack Paternal Grandfather   . Colon cancer Neg Hx   . Esophageal cancer Neg Hx   . Rectal cancer Neg Hx   . Stomach cancer Neg Hx     Social History   Socioeconomic History  . Marital status: Married    Spouse name: Not on file  . Number of children: 1  . Years of education: Not on file  . Highest education level: Not on file  Occupational History  . Occupation: Retired  Scientific laboratory technician  . Financial resource strain: Not on file  . Food insecurity:    Worry: Not on file    Inability: Not on file  . Transportation needs:    Medical: Not on file    Non-medical: Not on file  Tobacco Use  . Smoking status: Never Smoker  . Smokeless tobacco: Never Used  Substance and Sexual Activity  . Alcohol use: No   . Drug use: No  . Sexual activity: Not on file  Lifestyle  . Physical activity:    Days per week: Not on file    Minutes per session: Not on file  . Stress: Not on file  Relationships  . Social connections:    Talks on phone: Not on file    Gets together: Not on file    Attends religious service: Not on file    Active member of club or organization: Not on file    Attends meetings of clubs or organizations: Not on file    Relationship status: Not on file  Other Topics Concern  . Not on file  Social History Narrative   Lives with husband floyd   Drinks maybe 4 sodas a week   Review of Systems - See HPI.  All other ROS are negative.  BP 114/68   Pulse (!) 59   Temp 98.1 F (36.7 C) (Oral)   Resp 16   Ht 5\' 3"  (1.6 m)   Wt 189 lb (85.7 kg)   SpO2 98%   BMI 33.48 kg/m   Physical Exam Vitals signs reviewed.  Constitutional:      Appearance: Normal appearance.  HENT:     Head: Normocephalic and atraumatic.     Right Ear: Tympanic membrane normal.     Left Ear: Tympanic membrane normal.     Nose: Nose normal.  Eyes:     Conjunctiva/sclera: Conjunctivae normal.  Neck:     Musculoskeletal: Normal range of motion and neck supple.  Cardiovascular:     Rate and Rhythm: Normal rate and regular rhythm.     Pulses: Normal pulses.     Heart sounds: Normal heart sounds.  Pulmonary:     Effort: Pulmonary effort is normal.     Breath sounds: Normal breath sounds.  Neurological:     Mental Status: She is alert.    Recent Results (from the past 2160 hour(s))  Lipid panel     Status: Abnormal   Collection Time: 02/05/18  8:22 AM  Result Value Ref Range   Cholesterol 158 0 - 200 mg/dL    Comment: ATP III Classification       Desirable:  < 200 mg/dL               Borderline High:  200 - 239 mg/dL          High:  > = 240 mg/dL   Triglycerides 125.0  0.0 - 149.0 mg/dL    Comment: Normal:  <150 mg/dLBorderline High:  150 - 199 mg/dL   HDL 39.00 (L) >39.00 mg/dL   VLDL 25.0  0.0 - 40.0 mg/dL   LDL Cholesterol 94 0 - 99 mg/dL   Total CHOL/HDL Ratio 4     Comment:                Men          Women1/2 Average Risk     3.4          3.3Average Risk          5.0          4.42X Average Risk          9.6          7.13X Average Risk          15.0          11.0                       NonHDL 119.44     Comment: NOTE:  Non-HDL goal should be 30 mg/dL higher than patient's LDL goal (i.e. LDL goal of < 70 mg/dL, would have non-HDL goal of < 100 mg/dL)  TSH     Status: None   Collection Time: 02/05/18  8:22 AM  Result Value Ref Range   TSH 1.74 0.35 - 4.50 uIU/mL  Hepatic function panel     Status: None   Collection Time: 02/05/18  8:22 AM  Result Value Ref Range   Total Bilirubin 0.4 0.2 - 1.2 mg/dL   Bilirubin, Direct 0.1 0.0 - 0.3 mg/dL   Alkaline Phosphatase 101 39 - 117 U/L   AST 19 0 - 37 U/L   ALT 22 0 - 35 U/L   Total Protein 7.0 6.0 - 8.3 g/dL   Albumin 4.1 3.5 - 5.2 g/dL  CBC with Differential/Platelet     Status: None   Collection Time: 02/05/18  8:22 AM  Result Value Ref Range   WBC 8.1 4.0 - 10.5 K/uL   RBC 4.59 3.87 - 5.11 Mil/uL   Hemoglobin 13.5 12.0 - 15.0 g/dL   HCT 41.0 36.0 - 46.0 %   MCV 89.3 78.0 - 100.0 fl   MCHC 33.0 30.0 - 36.0 g/dL   RDW 14.9 11.5 - 15.5 %   Platelets 333.0 150.0 - 400.0 K/uL   Neutrophils Relative % 60.1 43.0 - 77.0 %   Lymphocytes Relative 31.7 12.0 - 46.0 %   Monocytes Relative 6.0 3.0 - 12.0 %   Eosinophils Relative 1.3 0.0 - 5.0 %   Basophils Relative 0.9 0.0 - 3.0 %   Neutro Abs 4.9 1.4 - 7.7 K/uL   Lymphs Abs 2.6 0.7 - 4.0 K/uL   Monocytes Absolute 0.5 0.1 - 1.0 K/uL   Eosinophils Absolute 0.1 0.0 - 0.7 K/uL   Basophils Absolute 0.1 0.0 - 0.1 K/uL  Basic metabolic panel     Status: Abnormal   Collection Time: 02/05/18  8:22 AM  Result Value Ref Range   Sodium 140 135 - 145 mEq/L   Potassium 3.8 3.5 - 5.1 mEq/L   Chloride 103 96 - 112 mEq/L   CO2 31 19 - 32 mEq/L   Glucose, Bld 107 (H) 70 - 99 mg/dL   BUN  17 6 - 23 mg/dL   Creatinine, Ser 0.92 0.40 - 1.20 mg/dL   Calcium 9.5 8.4 - 10.5 mg/dL  GFR 63.81 >60.00 mL/min  POC Influenza A&B(BINAX/QUICKVUE)     Status: Normal   Collection Time: 03/01/18  4:26 PM  Result Value Ref Range   Influenza A, POC Negative Negative   Influenza B, POC Negative Negative    Assessment/Plan: 1. Viral URI with cough - promethazine-dextromethorphan (PROMETHAZINE-DM) 6.25-15 MG/5ML syrup; Take 5 mLs by mouth 4 (four) times daily as needed.  Dispense: 118 mL; Refill: 0 2. Bronchospasm - promethazine-dextromethorphan (PROMETHAZINE-DM) 6.25-15 MG/5ML syrup; Take 5 mLs by mouth 4 (four) times daily as needed.  Dispense: 118 mL; Refill: 0 - predniSONE (DELTASONE) 10 MG tablet; Take 3 tablets (30 mg total) by mouth daily with breakfast for 3 days, THEN 2 tablets (20 mg total) daily with breakfast for 3 days, THEN 1 tablet (10 mg total) daily with breakfast for 3 days.  Dispense: 18 tablet; Refill: 0  Lungs CTAB. Afebrile. Supportive measures and OTC medications again reviewed. Stop tessalon. Rx Promethazine-DM cough syrup. Short burst of prednisone given for bronchospasm  Leeanne Rio, PA-C

## 2018-03-04 NOTE — Patient Instructions (Signed)
Please keep well-hydrated and get plenty of rest.  Use the prescription cough syrup as directed to help with cough. Start the prednisone taper, taking as directed. Continue supportive measures recommended by Dr. Anitra Lauth.   Let me know if symptoms are not easing up in the next 48 hours.

## 2018-03-04 NOTE — Telephone Encounter (Signed)
Returned call to patient who is concerned that her cough has not improved.  She states that she coughs so hard she loses her breath.  She was see for this on Friday at another office but feels she has not improved.  Her cough is productive. Sputum is green.  She is achy. She has no sinus drainage.  She has no fever. She has been taking Mucinex DM, Tessalon, and Fioricet ESGIC. Appointment scheduled per protocol.  Care advice read to patient.  Pt verbalized understanding. Reason for Disposition . [1] Continuous (nonstop) coughing interferes with work or school AND [2] no improvement using cough treatment per Care Advice  Answer Assessment - Initial Assessment Questions 1. ONSET: "When did the cough begin?"      Sunday last week 2. SEVERITY: "How bad is the cough today?"      All the time 3. RESPIRATORY DISTRESS: "Describe your breathing."      No problem 4. FEVER: "Do you have a fever?" If so, ask: "What is your temperature, how was it measured, and when did it start?"     no 5. SPUTUM: "Describe the color of your sputum" (clear, white, yellow, green)     green 6. HEMOPTYSIS: "Are you coughing up any blood?" If so ask: "How much?" (flecks, streaks, tablespoons, etc.)     no 7. CARDIAC HISTORY: "Do you have any history of heart disease?" (e.g., heart attack, congestive heart failure)      no 8. LUNG HISTORY: "Do you have any history of lung disease?"  (e.g., pulmonary embolus, asthma, emphysema)     no 9. PE RISK FACTORS: "Do you have a history of blood clots?" (or: recent major surgery, recent prolonged travel, bedridden)     no 10. OTHER SYMPTOMS: "Do you have any other symptoms?" (e.g., runny nose, wheezing, chest pain)       Achy  Just in chest 11. PREGNANCY: "Is there any chance you are pregnant?" "When was your last menstrual period?"       N/A 12. TRAVEL: "Have you traveled out of the country in the last month?" (e.g., travel history, exposures)       no  Protocols used: [place]

## 2018-03-05 ENCOUNTER — Ambulatory Visit
Admission: RE | Admit: 2018-03-05 | Discharge: 2018-03-05 | Disposition: A | Discharge status: Home / Self Care | Payer: Medicare Other | Source: Ambulatory Visit | Attending: Family Medicine | Admitting: Family Medicine

## 2018-03-05 DIAGNOSIS — Z1231 Encounter for screening mammogram for malignant neoplasm of breast: Secondary | ICD-10-CM

## 2018-04-16 ENCOUNTER — Other Ambulatory Visit: Payer: Self-pay | Admitting: Family Medicine

## 2018-04-18 ENCOUNTER — Other Ambulatory Visit: Payer: Self-pay | Admitting: Family Medicine

## 2018-05-19 ENCOUNTER — Other Ambulatory Visit: Payer: Self-pay | Admitting: Family Medicine

## 2018-05-22 ENCOUNTER — Ambulatory Visit (INDEPENDENT_AMBULATORY_CARE_PROVIDER_SITE_OTHER): Payer: Medicare Other | Admitting: Family Medicine

## 2018-05-22 ENCOUNTER — Encounter: Payer: Self-pay | Admitting: Family Medicine

## 2018-05-22 ENCOUNTER — Other Ambulatory Visit: Payer: Self-pay

## 2018-05-22 VITALS — BP 116/72 | HR 94 | Temp 98.5°F | Resp 16 | Ht 63.0 in | Wt 195.5 lb

## 2018-05-22 DIAGNOSIS — J329 Chronic sinusitis, unspecified: Secondary | ICD-10-CM | POA: Diagnosis not present

## 2018-05-22 DIAGNOSIS — B9689 Other specified bacterial agents as the cause of diseases classified elsewhere: Secondary | ICD-10-CM | POA: Diagnosis not present

## 2018-05-22 MED ORDER — CETIRIZINE HCL 10 MG PO TABS: 10.0000 mg | ORAL_TABLET | Freq: Every day | ORAL | 11 refills | Status: DC

## 2018-05-22 MED ORDER — AMOXICILLIN 875 MG PO TABS: 875.0000 mg | ORAL_TABLET | Freq: Two times a day (BID) | ORAL | 0 refills | Status: DC

## 2018-05-22 NOTE — Patient Instructions (Signed)
Follow up as needed or as scheduled START the Amoxicillin twice daily- take w/ food ADD daily Cetirizine to improve allergies Drink plenty of fluids REST! Call with any questions or concerns Stay Safe!!

## 2018-05-22 NOTE — Progress Notes (Signed)
   Subjective:    Patient ID: Andrea Byrd, female    DOB: [birth year], 73 y.o.   MRN: [id number]  HPI URI- sxs started ~4 days ago w/ sore teeth.  + HA.  + maxillary pain.  No fevers.  + nasal drainage.  + cough- productive.  No N/V.  No known sick contacts.  Has taken multiple OTC medications w/o relief.  + mold exposure.   Review of Systems For ROS see HPI     Objective:   Physical Exam Vitals signs reviewed.  Constitutional:      General: She is not in acute distress.    Appearance: She is well-developed.  HENT:     Head: Normocephalic and atraumatic.     Right Ear: Tympanic membrane normal.     Left Ear: Tympanic membrane normal.     Nose: Mucosal edema and rhinorrhea present.     Right Sinus: Maxillary sinus tenderness and frontal sinus tenderness present.     Left Sinus: Maxillary sinus tenderness and frontal sinus tenderness present.     Mouth/Throat:     Pharynx: Uvula midline. Posterior oropharyngeal erythema present. No oropharyngeal exudate.  Eyes:     Conjunctiva/sclera: Conjunctivae normal.     Pupils: Pupils are equal, round, and reactive to light.  Neck:     Musculoskeletal: Normal range of motion and neck supple.  Cardiovascular:     Rate and Rhythm: Normal rate and regular rhythm.     Heart sounds: Normal heart sounds.  Pulmonary:     Effort: Pulmonary effort is normal. No respiratory distress.     Breath sounds: Normal breath sounds. No wheezing.  Lymphadenopathy:     Cervical: No cervical adenopathy.           Assessment & Plan:  Bacterial sinusitis- new.  Pt's sxs and PE consistent w/ infxn.  Start abx.  Reviewed supportive care and red flags that should prompt return.  Pt expressed understanding and is in agreement w/ plan.

## 2018-06-27 DIAGNOSIS — M1712 Unilateral primary osteoarthritis, left knee: Secondary | ICD-10-CM | POA: Diagnosis not present

## 2018-07-14 ENCOUNTER — Other Ambulatory Visit: Payer: Self-pay | Admitting: Family Medicine

## 2018-08-05 ENCOUNTER — Encounter: Payer: Self-pay | Admitting: Family Medicine

## 2018-08-05 ENCOUNTER — Ambulatory Visit (INDEPENDENT_AMBULATORY_CARE_PROVIDER_SITE_OTHER): Payer: Medicare Other | Admitting: Family Medicine

## 2018-08-05 ENCOUNTER — Other Ambulatory Visit: Payer: Self-pay

## 2018-08-05 ENCOUNTER — Ambulatory Visit: Payer: 59 | Admitting: Family Medicine

## 2018-08-05 VITALS — BP 115/70 | HR 72 | Temp 97.6°F | Ht 62.0 in | Wt 190.0 lb

## 2018-08-05 DIAGNOSIS — E669 Obesity, unspecified: Secondary | ICD-10-CM | POA: Diagnosis not present

## 2018-08-05 DIAGNOSIS — R590 Localized enlarged lymph nodes: Secondary | ICD-10-CM | POA: Diagnosis not present

## 2018-08-05 DIAGNOSIS — I10 Essential (primary) hypertension: Secondary | ICD-10-CM | POA: Diagnosis not present

## 2018-08-05 MED ORDER — CYCLOBENZAPRINE HCL 10 MG PO TABS: 10.0000 mg | ORAL_TABLET | Freq: Every day | ORAL | 3 refills | Status: DC

## 2018-08-05 MED ORDER — FUROSEMIDE 20 MG PO TABS: 20.0000 mg | ORAL_TABLET | Freq: Every day | ORAL | 3 refills | Status: DC

## 2018-08-05 NOTE — Progress Notes (Signed)
I have discussed the procedure for the virtual visit with the patient who has given consent to proceed with assessment and treatment.   Richetta Cubillos L Jaya Lapka, CMA     

## 2018-08-05 NOTE — Progress Notes (Signed)
Virtual Visit via Video   I connected with patient on 08/05/18 at  8:40 AM EDT by a video enabled telemedicine application and verified that I am speaking with the correct person using two identifiers.  Location patient: Home Location provider: [facility]up[facility], Office Persons participating in the virtual visit: Patient, Provider, [place] (Jess B)  I discussed the limitations of evaluation and management by telemedicine and the availability of in person appointments. The patient expressed understanding and agreed to proceed.  Subjective:   HPI:   HTN- chronic problem, on Lasix 20mg  PRN, Losartan HCTZ 50/12.5mg  daily, Metoprolol 100mg  daily w/ good control.  No CP, SOB, HAs, visual changes, abd pain.  Mild swelling but pt feels this is arthritis.   Obesity- weight is stable.  BMI is 34.75.  No regular exercise, working in the garden.  LAD- L sided parotid LAD.  Pt reports 'this just popped up'.  Had similar 3 yrs ago and had US showing benign LN.  No TTP.  ROS:   See pertinent positives and negatives per HPI.  Patient Active Problem List   Diagnosis Date Noted  . Restless legs syndrome (RLS) 08/29/2016  . Lumbar radiculopathy 08/29/2016  . Obesity (BMI 30-39.9) 02/23/2016  . Localized swelling, mass and lump, neck 02/23/2016  . Neuropathy 08/13/2012  . Essential hypertension 02/05/2009  . ARTHRITIS 02/05/2009  . Dysphagia 02/05/2009  . ABDOMINAL BLOATING 02/05/2009  . ABDOMINAL PAIN, LEFT LOWER QUADRANT 02/05/2009  . ESOPHAGEAL STRICTURE 02/04/2009  . GERD 02/04/2009  . GASTRITIS 02/04/2009  . HIATAL HERNIA 02/04/2009  . DIVERTICULOSIS OF COLON 02/04/2009  . IRRITABLE BOWEL SYNDROME 02/04/2009  . ENDOMETRIOSIS 02/04/2009    Social History   Tobacco Use  . Smoking status: Never Smoker  . Smokeless tobacco: Never Used  Substance Use Topics  . Alcohol use: No    Current Outpatient Medications:  .  aspirin 81 MG tablet, Take 81 mg by mouth daily., Disp: , Rfl:  .   butalbital-acetaminophen-caffeine (FIORICET, ESGIC) 50-325-40 MG tablet, 1-2 tabs po bid prn headache/body aches, Disp: 20 tablet, Rfl: 0 .  cetirizine (ZYRTEC) 10 MG tablet, Take 1 tablet (10 mg total) by mouth daily., Disp: 30 tablet, Rfl: 11 .  cyclobenzaprine (FLEXERIL) 10 MG tablet, TAKE 1 TABLET BY MOUTH AT BEDTIME, Disp: 30 tablet, Rfl: 3 .  ferrous sulfate 325 (65 FE) MG tablet, Take one po TID, Disp: 90 tablet, Rfl: 3 .  Fluocinolone Acetonide (DERMOTIC) 0.01 % OIL, Place 5 drops in ear(s) 2 (two) times daily., Disp: 20 mL, Rfl: 1 .  furosemide (LASIX) 20 MG tablet, Take 1 tablet (20 mg total) by mouth daily., Disp: 30 tablet, Rfl: 3 .  gabapentin (NEURONTIN) 300 MG capsule, Take 2 capsules (600 mg total) by mouth 2 (two) times daily., Disp: 360 capsule, Rfl: 4 .  latanoprost (XALATAN) 0.005 % ophthalmic solution, Place 1 drop into both eyes at bedtime. , Disp: , Rfl:  .  losartan-hydrochlorothiazide (HYZAAR) 50-12.5 MG tablet, TAKE 1 TABLET BY MOUTH EVERY DAY, Disp: 90 tablet, Rfl: 1 .  metoprolol succinate (TOPROL-XL) 100 MG 24 hr tablet, TAKE 1 TABLET BY MOUTH DAILY. NO FURTHER REFILLS WITHOUT AN APPT., Disp: 90 tablet, Rfl: 0 .  omeprazole (PRILOSEC) 40 MG capsule, Take 1 capsule (40 mg total) by mouth 2 (two) times daily., Disp: 120 capsule, Rfl: 3 .  pramipexole (MIRAPEX) 1 MG tablet, Take 1 tablet (1 mg total) by mouth 2 (two) times daily., Disp: 180 tablet, Rfl: 4 .  Wheat  Dextrin (BENEFIBER) POWD, Take 1 tablespoon three times a day, Disp: , Rfl: 0  Allergies  Allergen Reactions  . Nitrofurantoin     Makes mouth break out/blisters  . Propoxyphene N-Acetaminophen     REACTION: nausea  . Dexilant [Dexlansoprazole] Nausea And Vomiting  . Nexium [Esomeprazole Magnesium] Nausea And Vomiting  . Nortriptyline Hcl Nausea And Vomiting  . Hydrocodone-Homatropine Itching and Rash  . Morphine Itching and Rash    Objective:   BP 115/70   Pulse 72   Temp 97.6 F (36.4 C)  (Temporal)   Ht 5\' 2"  (1.575 m)   Wt 190 lb (86.2 kg)   BMI 34.75 kg/m   AAOx3, NAD NCAT, EOMI No obvious CN deficits Coloring WNL Pt is able to speak clearly, coherently without shortness of breath or increased work of breathing.  Thought process is linear.  Mood is appropriate.   Assessment and Plan:   HTN- chronic problem, well controlled.  Asymptomatic.  Check labs.  No anticipated med changes.  Will follow.  Obesity- chronic problem.  Pt is not exercising regularly nor following particular diet.  Encouraged daily activity and healthy diet.  Will check labs to risk stratify.  Will follow.  LAD- pt has hx of similar.  [place] 3 yrs ago showed benign LAD.  As this just occurred, encouraged pt to monitor and if no improvement or resolution will repeat imaging.  Pt expressed understanding and is in agreement w/ plan.    [name], MD 08/05/2018

## 2018-08-07 ENCOUNTER — Other Ambulatory Visit: Payer: Self-pay

## 2018-08-07 ENCOUNTER — Ambulatory Visit (INDEPENDENT_AMBULATORY_CARE_PROVIDER_SITE_OTHER): Payer: Medicare Other

## 2018-08-07 DIAGNOSIS — I10 Essential (primary) hypertension: Secondary | ICD-10-CM | POA: Diagnosis not present

## 2018-08-07 LAB — LIPID PANEL
Cholesterol: 152 mg/dL (ref 0–200)
HDL: 36.5 mg/dL — ABNORMAL LOW (ref >39.00)
LDL Cholesterol: 97 mg/dL (ref 0–99)
NonHDL: 115.92
Total CHOL/HDL Ratio: 4
Triglycerides: 94 mg/dL (ref 0.0–149.0)
VLDL: 18.8 mg/dL (ref 0.0–40.0)

## 2018-08-07 LAB — HEPATIC FUNCTION PANEL
ALT: 20 U/L (ref 0–35)
AST: 22 U/L (ref 0–37)
Albumin: 3.8 g/dL (ref 3.5–5.2)
Alkaline Phosphatase: 107 U/L (ref 39–117)
Bilirubin, Direct: 0.1 mg/dL (ref 0.0–0.3)
Total Bilirubin: 0.5 mg/dL (ref 0.2–1.2)
Total Protein: 6.3 g/dL (ref 6.0–8.3)

## 2018-08-07 LAB — CBC WITH DIFFERENTIAL/PLATELET
Basophils Absolute: 0.1 10*3/uL (ref 0.0–0.1)
Basophils Relative: 1.4 % (ref 0.0–3.0)
Eosinophils Absolute: 0.2 10*3/uL (ref 0.0–0.7)
Eosinophils Relative: 2.4 % (ref 0.0–5.0)
HCT: 38 % (ref 36.0–46.0)
Hemoglobin: 12.8 g/dL (ref 12.0–15.0)
Lymphocytes Relative: 36.9 % (ref 12.0–46.0)
Lymphs Abs: 2.6 10*3/uL (ref 0.7–4.0)
MCHC: 33.6 g/dL (ref 30.0–36.0)
MCV: 88.7 fl (ref 78.0–100.0)
Monocytes Absolute: 0.5 10*3/uL (ref 0.1–1.0)
Monocytes Relative: 6.5 % (ref 3.0–12.0)
Neutro Abs: 3.7 10*3/uL (ref 1.4–7.7)
Neutrophils Relative %: 52.8 % (ref 43.0–77.0)
Platelets: 308 10*3/uL (ref 150.0–400.0)
RBC: 4.29 Mil/uL (ref 3.87–5.11)
RDW: 15 % (ref 11.5–15.5)
WBC: 7 10*3/uL (ref 4.0–10.5)

## 2018-08-07 LAB — BASIC METABOLIC PANEL
BUN: 18 mg/dL (ref 6–23)
CO2: 34 mEq/L — ABNORMAL HIGH (ref 19–32)
Calcium: 9.4 mg/dL (ref 8.4–10.5)
Chloride: 99 mEq/L (ref 96–112)
Creatinine, Ser: 0.93 mg/dL (ref 0.40–1.20)
GFR: 59.21 mL/min — ABNORMAL LOW (ref >60.00)
Glucose, Bld: 99 mg/dL (ref 70–99)
Potassium: 3.3 mEq/L — ABNORMAL LOW (ref 3.5–5.1)
Sodium: 140 mEq/L (ref 135–145)

## 2018-08-07 LAB — TSH: TSH: 2.25 u[IU]/mL (ref 0.35–4.50)

## 2018-08-08 ENCOUNTER — Encounter: Payer: Self-pay | Admitting: General Practice

## 2018-08-08 DIAGNOSIS — H401232 Low-tension glaucoma, bilateral, moderate stage: Secondary | ICD-10-CM | POA: Diagnosis not present

## 2018-10-02 ENCOUNTER — Ambulatory Visit: Payer: Medicare Other

## 2018-10-02 NOTE — Progress Notes (Deleted)
Subjective:   Andrea Byrd is a 73 y.o. female who presents for Medicare Annual (Subsequent) preventive examination.  Review of Systems:  No ROS.  Medicare Wellness Virtual Visit.  Visual/audio telehealth visit, UTA vital signs.   See social history for additional risk factors.    Sleep patterns:  Home Safety/Smoke Alarms: Feels safe in home. Smoke alarms in place.  Living environment; residence and Firearm Safety: Lives with husband in 1 story home, rail at steps. Seat Belt Safety/Bike Helmet: Wears seat belt.   Female:   [id number]      Mammo-03/05/2018,  BI-RADS CATEGORY  1: Negative.      Dexa scan-02/19/2012, normal.          CCS-Colonoscopy 02/09/2012, normal. Recall 10 years      Objective:     Vitals: There were no vitals taken for this visit.  There is no height or weight on file to calculate BMI.  Advanced Directives 09/26/2017 08/30/2016 08/25/2014  Does Patient Have a Medical Advance Directive? Yes Yes Yes  Type of Paramedic of [place];Living will Living will;Healthcare Power of Attorney Living will;Healthcare Power of [place] in Chart? No - copy requested No - copy requested No - copy requested    Tobacco Social History   Tobacco Use  Smoking Status Never Smoker  Smokeless Tobacco Never Used     Counseling given: Not Answered    Past Medical History:  Diagnosis Date  . Anemia   . Arthritis   . Cervical cancer ([place]) 1977  . Diverticulosis   . Endometriosis   . Esophageal stricture   . Gastritis   . GERD (gastroesophageal reflux disease)   . Glaucoma   . Herpes simplex type 1 infection   . Hiatal hernia   . History of colon polyps   . History of gallstones   . Hypertension   . IBS (irritable bowel syndrome)   . Insomnia   . Post-operative nausea and vomiting   . Restless leg syndrome    Past Surgical History:  Procedure Laterality Date  . ABDOMINAL HYSTERECTOMY  1998  .  APPENDECTOMY  1998  . CERVICAL CONE BIOPSY    . CHOLECYSTECTOMY  1980  . COLON RESECTION  2002  . COLON SURGERY  2003   removed 2 feet colon  . ESOPHAGEAL MANOMETRY N/A 04/24/2016   Procedure: ESOPHAGEAL MANOMETRY (EM);  Surgeon: Mauri Pole, MD;  Location: [facility];  Service: Endoscopy;  Laterality: N/A;  . HAND SURGERY     right/ thumb joints replaced both hands  . OOPHORECTOMY    . TUBAL LIGATION     Family History  Problem Relation Age of Onset  . Diabetes Mother   . Stroke Mother        brother, sister, MGM  . Colon polyps Mother   . Heart disease Father   . Kidney disease Father   . Colon polyps Father   . Lung disease Father   . Kidney cancer Sister   . Stroke Sister   . Diabetes Sister   . Colon polyps Sister   . Celiac disease Other        niece  . Heart attack Paternal Grandfather   . Colon cancer Neg Hx   . Esophageal cancer Neg Hx   . Rectal cancer Neg Hx   . Stomach cancer Neg Hx    Social History   Socioeconomic History  . Marital status: Married  Spouse name: Not on file  . Number of children: 1  . Years of education: Not on file  . Highest education level: Not on file  Occupational History  . Occupation: Retired  Scientific laboratory technician  . Financial resource strain: Not on file  . Food insecurity    Worry: Not on file    Inability: Not on file  . Transportation needs    Medical: Not on file    Non-medical: Not on file  Tobacco Use  . Smoking status: Never Smoker  . Smokeless tobacco: Never Used  Substance and Sexual Activity  . Alcohol use: No  . Drug use: No  . Sexual activity: Not on file  Lifestyle  . Physical activity    Days per week: Not on file    Minutes per session: Not on file  . Stress: Not on file  Relationships  . Social Herbalist on phone: Not on file    Gets together: Not on file    Attends religious service: Not on file    Active member of club or organization: Not on file    Attends meetings of clubs  or organizations: Not on file    Relationship status: Not on file  Other Topics Concern  . Not on file  Social History Narrative   Lives with husband floyd   Drinks maybe 4 sodas a week    Outpatient Encounter Medications as of 10/02/2018  Medication Sig  . aspirin 81 MG tablet Take 81 mg by mouth daily.  . butalbital-acetaminophen-caffeine (FIORICET, ESGIC) 50-325-40 MG tablet 1-2 tabs po bid prn headache/body aches  . cetirizine (ZYRTEC) 10 MG tablet Take 1 tablet (10 mg total) by mouth daily.  . cyclobenzaprine (FLEXERIL) 10 MG tablet Take 1 tablet (10 mg total) by mouth at bedtime.  . ferrous sulfate 325 (65 FE) MG tablet Take one po TID  . Fluocinolone Acetonide (DERMOTIC) 0.01 % OIL Place 5 drops in ear(s) 2 (two) times daily.  . furosemide (LASIX) 20 MG tablet Take 1 tablet (20 mg total) by mouth daily.  Marland Kitchen gabapentin (NEURONTIN) 300 MG capsule Take 2 capsules (600 mg total) by mouth 2 (two) times daily.  Marland Kitchen latanoprost (XALATAN) 0.005 % ophthalmic solution Place 1 drop into both eyes at bedtime.   Marland Kitchen losartan-hydrochlorothiazide (HYZAAR) 50-12.5 MG tablet TAKE 1 TABLET BY MOUTH EVERY DAY  . metoprolol succinate (TOPROL-XL) 100 MG 24 hr tablet TAKE 1 TABLET BY MOUTH DAILY. NO FURTHER REFILLS WITHOUT AN APPT.  Marland Kitchen omeprazole (PRILOSEC) 40 MG capsule Take 1 capsule (40 mg total) by mouth 2 (two) times daily.  . pramipexole (MIRAPEX) 1 MG tablet Take 1 tablet (1 mg total) by mouth 2 (two) times daily.  . Wheat Dextrin (BENEFIBER) POWD Take 1 tablespoon three times a day   No facility-administered encounter medications on file as of 10/02/2018.     Activities of Daily Living In your present state of health, do you have any difficulty performing the following activities: 08/05/2018 05/22/2018  Hearing? N N  Vision? N N  Difficulty concentrating or making decisions? N N  Walking or climbing stairs? N N  Dressing or bathing? N N  Doing errands, shopping? N N  Some recent data might be  hidden    Patient Care Team: [name], MD as PCP - General (Family Medicine) Mauri Pole, MD as Consulting Physician (Gastroenterology) Penni Bombard, MD as Consulting Physician (Neurology) Harriett Sine, MD as Consulting Physician (Dermatology) Ortho,  [place] (Specialist)    Assessment:   This is a routine wellness examination for Keylah.  Exercise Activities and Dietary recommendations   Diet (meal preparation, eat out, water intake, caffeinated beverages, dairy products, fruits and vegetables):   Breakfast: Lunch:  Dinner:      Goals    . Increase physical activity     Stay active by gardening and yard maintenance.     . Weight (lb) < 180 lb (81.6 kg)     Lose weight by increasing activity.        Fall Risk Fall Risk  08/05/2018 05/22/2018 02/04/2018 10/03/2017 09/26/2017  Falls in the past year? 0 0 0 Yes Yes  Comment - - - - tripped over dog; stepped off ladder  Number falls in past yr: 0 0 - 2 or more 2 or more  Injury with Fall? 0 0 - No No  Follow up - - - Falls prevention discussed Falls prevention discussed    Depression Screen PHQ 2/9 Scores 08/05/2018 05/22/2018 02/04/2018 10/03/2017  PHQ - 2 Score 0 0 0 0  PHQ- 9 Score 0 - 0 0     Cognitive Function MMSE - Mini Mental State Exam 09/26/2017  Orientation to time 5  Orientation to Place 5  Registration 3  Attention/ Calculation 5  Recall 3  Language- name 2 objects 2  Language- repeat 1  Language- follow 3 step command 3  Language- read & follow direction 1  Write a sentence 1  Copy design 1  Total score 30        Immunization History  Administered Date(s) Administered  . Influenza, High Dose Seasonal PF 01/11/2018  . Influenza,inj,Quad PF,6+ Mos 12/23/2014, 10/24/2015, 11/14/2016  . Pneumococcal Conjugate-13 09/09/2013  . Pneumococcal Polysaccharide-23 12/20/2011  . Tdap 07/28/2005     Screening Tests Health Maintenance  Topic Date Due  . Hepatitis C Screening   1945-05-20  . TETANUS/TDAP  07/29/2015  . INFLUENZA VACCINE  10/05/2018  . MAMMOGRAM  03/05/2020  . COLONOSCOPY  02/08/2022  . DEXA SCAN  Completed  . PNA vac Low Risk Adult  Completed        Plan:     I have personally reviewed and noted the following in the patient's chart:   . Medical and social history . Use of alcohol, tobacco or illicit drugs  . Current medications and supplements . Functional ability and status . Nutritional status . Physical activity . Advanced directives . List of other physicians . Hospitalizations, surgeries, and ER visits in previous 12 months . Vitals . Screenings to include cognitive, depression, and falls . Referrals and appointments  In addition, I have reviewed and discussed with patient certain preventive protocols, quality metrics, and best practice recommendations. A written personalized care plan for preventive services as well as general preventive health recommendations were provided to patient.     Gerilyn Nestle, RN  10/02/2018

## 2018-10-07 ENCOUNTER — Other Ambulatory Visit: Payer: Self-pay

## 2018-10-07 ENCOUNTER — Ambulatory Visit: Payer: Medicare Other | Admitting: Adult Health

## 2018-10-07 ENCOUNTER — Encounter: Payer: Self-pay | Admitting: Family Medicine

## 2018-10-07 ENCOUNTER — Ambulatory Visit (INDEPENDENT_AMBULATORY_CARE_PROVIDER_SITE_OTHER): Payer: Medicare Other | Admitting: Family Medicine

## 2018-10-07 VITALS — BP 121/72 | HR 66 | Temp 97.3°F | Ht 62.0 in | Wt 194.0 lb

## 2018-10-07 DIAGNOSIS — G2581 Restless legs syndrome: Secondary | ICD-10-CM

## 2018-10-07 MED ORDER — PRAMIPEXOLE DIHYDROCHLORIDE 1 MG PO TABS: 1.0000 mg | ORAL_TABLET | Freq: Two times a day (BID) | ORAL | 4 refills | Status: DC

## 2018-10-07 MED ORDER — GABAPENTIN 300 MG PO CAPS: 600.0000 mg | ORAL_CAPSULE | Freq: Two times a day (BID) | ORAL | 4 refills | Status: DC

## 2018-10-07 NOTE — Progress Notes (Signed)
PATIENT: Andrea Byrd DOB: 18-May-1945  REASON FOR VISIT: follow up HISTORY FROM: patient  Chief Complaint  Patient presents with  . Follow-up    Yearly f/u. Alone. Rm 2. Patient mentioned that her legs hurt more than they normally would.      HISTORY OF PRESENT ILLNESS: Today 10/07/18 Andrea Byrd is a 73 y.o. female here today for follow up for restless legs. She continues pramipexole 1mg  BID and gabapentin 600mg  BID. She does continue to have aching in bilateral legs at night. She feels that this is manageable and does not wish to increase/add medications. 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 She is tolerating medications well with no adverse effects noted.   HISTORY: (copied from Dr Gladstone Lighter note on 10/02/2017)  UPDATE (10/02/17, VRP): Since last visit, doing well. Symptoms are mild. Severity is mild. No alleviating or aggravating factors. Tolerating meds. (on side note, pt husband is in hospital with stroke, and planning to be d/c'd today).   UPDATE 08/29/16: Since last visit, RLS stable on gabapentin and pramipexole. Sharp pains in toes and feet slightly worse.   UPDATE 09/01/15: Since last visit, doing well. On gabapentin 600mg  BID + pramipexole 1mg  BID. Sxs well controlled. No side effects.   UPDATE 08/25/14: Since last visit, doing well. Has had chiro tx for back issues and feels better. On gabapentin 300mg  TID + pramipex 1mg  qhs. Satisfied with current dosing.   UPDATE 04/17/14: Since last visit, symptoms have continued. Numbness in feet, restless legs at night. On gabapentin 300mg  TID. PCP also tried her on pramipexole 0.25mg x3 at bedtime.   UPDATE 08/13/12: 73 year old right-handed female with history of hypertension, restless leg syndrome, here for evaluation of progressive numbness and tingling in her toes and feet. I previously saw this patient in October 2013 as referred by primary care physician. Now patient is  referred to me by orthopedic surgery. Since last visit patient's symptoms have progressed. Now she has numbness and tingling in her bilateral feet up to her ankles. She has intermittent sharp sensations as well. Symptoms are fairly symmetric. No symptoms in her upper legs, lower back, torso, arms or neck.  PRIOR HPI (12/18/11): 73 year old right-handed female with history of hypertension, this is a syndrome, here for evaluation of numbness in her feet for the past 3 months. Patient describes numbness and tingling in her toes and bottom of her feet. No significant pain. She also feels coldness in her feet. No back pain or symptoms radiating from her back to her feet. No symptoms in her hands or fingers. No neck pain. Symptoms are mild and gradually progressing. 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 She did not have any numbness in the symptoms at that time.   REVIEW OF SYSTEMS: Out of a complete 14 system review of symptoms, the patient complains only of the following symptoms, leg aches and all other reviewed systems are negative.  ALLERGIES: Allergies  Allergen Reactions  . Nitrofurantoin     Makes mouth break out/blisters  . Propoxyphene N-Acetaminophen     REACTION: nausea  . Dexilant [Dexlansoprazole] Nausea And Vomiting  . Nexium [Esomeprazole Magnesium] Nausea And Vomiting  . Nortriptyline Hcl Nausea And Vomiting  . Hydrocodone-Homatropine Itching and Rash  . Morphine Itching and Rash    HOME MEDICATIONS: Outpatient Medications Prior to Visit  Medication Sig Dispense Refill  .  aspirin 81 MG tablet Take 81 mg by mouth daily.    . butalbital-acetaminophen-caffeine (FIORICET, ESGIC) 50-325-40 MG tablet 1-2 tabs po bid prn headache/body aches 20 tablet 0  . cetirizine (ZYRTEC) 10 MG tablet Take 1 tablet (10 mg total) by mouth daily. 30 tablet 11  . cyclobenzaprine (FLEXERIL) 10 MG  tablet Take 1 tablet (10 mg total) by mouth at bedtime. 30 tablet 3  . ferrous sulfate 325 (65 FE) MG tablet Take one po TID 90 tablet 3  . Fluocinolone Acetonide (DERMOTIC) 0.01 % OIL Place 5 drops in ear(s) 2 (two) times daily. 20 mL 1  . furosemide (LASIX) 20 MG tablet Take 1 tablet (20 mg total) by mouth daily. 30 tablet 3  . latanoprost (XALATAN) 0.005 % ophthalmic solution Place 1 drop into both eyes at bedtime.     Marland Kitchen losartan-hydrochlorothiazide (HYZAAR) 50-12.5 MG tablet TAKE 1 TABLET BY MOUTH EVERY DAY 90 tablet 1  . metoprolol succinate (TOPROL-XL) 100 MG 24 hr tablet TAKE 1 TABLET BY MOUTH DAILY. NO FURTHER REFILLS WITHOUT AN APPT. 90 tablet 0  . omeprazole (PRILOSEC) 40 MG capsule Take 1 capsule (40 mg total) by mouth 2 (two) times daily. 120 capsule 3  . Wheat Dextrin (BENEFIBER) POWD Take 1 tablespoon three times a day  0  . gabapentin (NEURONTIN) 300 MG capsule Take 2 capsules (600 mg total) by mouth 2 (two) times daily. 360 capsule 4  . pramipexole (MIRAPEX) 1 MG tablet Take 1 tablet (1 mg total) by mouth 2 (two) times daily. 180 tablet 4   No facility-administered medications prior to visit.     PAST MEDICAL HISTORY: Past Medical History:  Diagnosis Date  . Anemia   . Arthritis   . Cervical cancer ([place]) 1977  . Diverticulosis   . Endometriosis   . Esophageal stricture   . Gastritis   . GERD (gastroesophageal reflux disease)   . Glaucoma   . Herpes simplex type 1 infection   . Hiatal hernia   . History of colon polyps   . History of gallstones   . Hypertension   . IBS (irritable bowel syndrome)   . Insomnia   . Post-operative nausea and vomiting   . Restless leg syndrome     PAST SURGICAL HISTORY: Past Surgical History:  Procedure Laterality Date  . ABDOMINAL HYSTERECTOMY  1998  . APPENDECTOMY  1998  . CERVICAL CONE BIOPSY    . CHOLECYSTECTOMY  1980  . COLON RESECTION  2002  . COLON SURGERY  2003   removed 2 feet colon  . ESOPHAGEAL MANOMETRY N/A  04/24/2016   Procedure: ESOPHAGEAL MANOMETRY (EM);  Surgeon: Mauri Pole, MD;  Location: [facility];  Service: Endoscopy;  Laterality: N/A;  . HAND SURGERY     right/ thumb joints replaced both hands  . OOPHORECTOMY    . TUBAL LIGATION      FAMILY HISTORY: Family History  Problem Relation Age of Onset  . Diabetes Mother   . Stroke Mother        brother, sister, MGM  . Colon polyps Mother   . Heart disease Father   . Kidney disease Father   . Colon polyps Father   . Lung disease Father   . Kidney cancer Sister   . Stroke Sister   . Diabetes Sister   . Colon polyps Sister   . Celiac disease Other        niece  . Heart attack Paternal Grandfather   . Colon cancer  Neg Hx   . Esophageal cancer Neg Hx   . Rectal cancer Neg Hx   . Stomach cancer Neg Hx     SOCIAL HISTORY: Social History   Socioeconomic History  . Marital status: Married    Spouse name: Not on file  . Number of children: 1  . Years of education: Not on file  . Highest education level: Not on file  Occupational History  . Occupation: Retired  Scientific laboratory technician  . Financial resource strain: Not on file  . Food insecurity    Worry: Not on file    Inability: Not on file  . Transportation needs    Medical: Not on file    Non-medical: Not on file  Tobacco Use  . Smoking status: Never Smoker  . Smokeless tobacco: Never Used  Substance and Sexual Activity  . Alcohol use: No  . Drug use: No  . Sexual activity: Not on file  Lifestyle  . Physical activity    Days per week: Not on file    Minutes per session: Not on file  . Stress: Not on file  Relationships  . Social Herbalist on phone: Not on file    Gets together: Not on file    Attends religious service: Not on file    Active member of club or organization: Not on file    Attends meetings of clubs or organizations: Not on file    Relationship status: Not on file  . Intimate partner violence    Fear of current or ex partner: Not on  file    Emotionally abused: Not on file    Physically abused: Not on file    Forced sexual activity: Not on file  Other Topics Concern  . Not on file  Social History Narrative   Lives with husband floyd   Drinks maybe 4 sodas a week      PHYSICAL EXAM  Vitals:   10/07/18 0915  BP: 121/72  Pulse: 66  Temp: (!) 97.3 F (36.3 C)  TempSrc: Oral  Weight: 194 lb (88 kg)  Height: 5\' 2"  (1.575 m)   Body mass index is 35.48 kg/m.  Generalized: Well developed, in no acute distress  Cardiology: normal rate and rhythm, no murmur noted Neurological examination  Mentation: Alert oriented to time, place, history taking. Follows all commands speech and language fluent Cranial nerve II-XII: Pupils were equal round reactive to light. Extraocular movements were full, visual field were full on confrontational test. Facial sensation and strength were normal. Uvula tongue midline. Head turning and shoulder shrug  were normal and symmetric. Motor: The motor testing reveals 5 over 5 strength of all 4 extremities. Good symmetric motor tone is noted throughout.  Sensory: Sensory testing is intact to soft touch on all 4 extremities. Pinprick testing intact of bilateral lower extremities. No evidence of extinction is noted.  Coordination: Cerebellar testing reveals good finger-nose-finger and heel-to-shin bilaterally.  Gait and station: Gait is normal.   DIAGNOSTIC DATA (LABS, IMAGING, TESTING) - I reviewed patient records, labs, notes, testing and imaging myself where available.  MMSE - Mini Mental State Exam 09/26/2017  Orientation to time 5  Orientation to Place 5  Registration 3  Attention/ Calculation 5  Recall 3  Language- name 2 objects 2  Language- repeat 1  Language- follow 3 step command 3  Language- read & follow direction 1  Write a sentence 1  Copy design 1  Total score 30  Lab Results  Component Value Date   WBC 7.0 08/07/2018   HGB 12.8 08/07/2018   HCT 38.0  08/07/2018   MCV 88.7 08/07/2018   PLT 308.0 08/07/2018      Component Value Date/Time   NA 140 08/07/2018 0900   NA 141 10/11/2015   K 3.3 (L) 08/07/2018 0900   CL 99 08/07/2018 0900   CO2 34 (H) 08/07/2018 0900   GLUCOSE 99 08/07/2018 0900   BUN 18 08/07/2018 0900   BUN 13 10/11/2015   CREATININE 0.93 08/07/2018 0900   CREATININE 0.88 08/06/2017 1651   CALCIUM 9.4 08/07/2018 0900   PROT 6.3 08/07/2018 0900   ALBUMIN 3.8 08/07/2018 0900   AST 22 08/07/2018 0900   ALT 20 08/07/2018 0900   ALKPHOS 107 08/07/2018 0900   BILITOT 0.5 08/07/2018 0900   GFRNONAA >60 10/05/2017 1535   GFRAA >60 10/05/2017 1535   Lab Results  Component Value Date   CHOL 152 08/07/2018   HDL 36.50 (L) 08/07/2018   LDLCALC 97 08/07/2018   TRIG 94.0 08/07/2018   CHOLHDL 4 08/07/2018   No results found for: HGBA1C No results found for: VITAMINB12 Lab Results  Component Value Date   TSH 2.25 08/07/2018     ASSESSMENT AND PLAN 73 y.o. year old female  has a past medical history of Anemia, Arthritis, Cervical cancer ([place]) (1977), Diverticulosis, Endometriosis, Esophageal stricture, Gastritis, GERD (gastroesophageal reflux disease), Glaucoma, Herpes simplex type 1 infection, Hiatal hernia, History of colon polyps, History of gallstones, Hypertension, IBS (irritable bowel syndrome), Insomnia, Post-operative nausea and vomiting, and Restless leg syndrome. here with     ICD-10-CM   1. Restless legs syndrome (RLS)  G25.81 gabapentin (NEURONTIN) 300 MG capsule    pramipexole (MIRAPEX) 1 MG tablet    We will continue pramipexole 1mg  BID and gabapentin 600mg  BID. She was advised to stay active and exercise regularly. May consider neuropathy cream with worsening symptoms. Follow up in 1 year, sooner if needed. She verbalizes understanding and agreement with plan.    No orders of the defined types were placed in this encounter.    Meds ordered this encounter  Medications  . gabapentin (NEURONTIN) 300  MG capsule    Sig: Take 2 capsules (600 mg total) by mouth 2 (two) times daily.    Dispense:  360 capsule    Refill:  4    Order Specific Question:   Supervising Provider    Answer:   Melvenia Beam [id number]  . pramipexole (MIRAPEX) 1 MG tablet    Sig: Take 1 tablet (1 mg total) by mouth 2 (two) times daily.    Dispense:  180 tablet    Refill:  4    Order Specific Question:   Supervising Provider    Answer:   Melvenia Beam [id number]      I spent 15 minutes with the patient. 50% of this time was spent counseling and educating patient on plan of care and medications.    Debbora Presto, FNP-C 10/07/2018, 2:17 PM [facility] [address], [place], [place] [address] [phone]

## 2018-10-07 NOTE — Patient Instructions (Signed)
Continue Mirapex and gabapentin   Follow up in 1 year  Restless Legs Syndrome Restless legs syndrome is a condition that causes uncomfortable feelings or sensations in the legs, especially while sitting or lying down. The sensations usually cause an overwhelming urge to move the legs. The arms can also sometimes be affected. The condition can range from mild to severe. The symptoms often interfere with a person's ability to sleep. What are the causes? The cause of this condition is not known. What increases the risk? The following factors may make you more likely to develop this condition:  Being older than 50.  Pregnancy.  Being a woman. In general, the condition is more common in women than in men.  A family history of the condition.  Having iron deficiency.  Overuse of caffeine, nicotine, or alcohol.  Certain medical conditions, such as kidney disease, Parkinson's disease, or nerve damage.  Certain medicines, such as those for high blood pressure, nausea, colds, allergies, depression, and some heart conditions. What are the signs or symptoms? The main symptom of this condition is uncomfortable sensations in the legs, such as:  Pulling.  Tingling.  Prickling.  Throbbing.  Crawling.  Burning. Usually, the sensations:  Affect both sides of the body.  Are worse when you sit or lie down.  Are worse at night. These may wake you up or make it difficult to fall asleep.  Make you have a strong urge to move your legs.  Are temporarily relieved by moving your legs. The arms can also be affected, but this is rare. People who have this condition often have tiredness during the day because of their lack of sleep at night. How is this diagnosed? This condition may be diagnosed based on:  Your symptoms.  Blood tests. In some cases, you may be monitored in a sleep lab by a specialist (a sleep study). This can detect any disruptions in your sleep. How is this treated?  This condition is treated by managing the symptoms. This may include:  Lifestyle changes, such as exercising, using relaxation techniques, and avoiding caffeine, alcohol, or tobacco.  Medicines. Anti-seizure medicines may be tried first. Follow these instructions at home:     General instructions  Take over-the-counter and prescription medicines only as told by your health care provider.  Use methods to help relieve the uncomfortable sensations, such as: ? Massaging your legs. ? Walking or stretching. ? Taking a cold or hot bath.  Keep all follow-up visits as told by your health care provider. This is important. Lifestyle  Practice good sleep habits. For example, go to bed and get up at the same time every day. Most adults should get 7-9 hours of sleep each night.  Exercise regularly. Try to get at least 30 minutes of exercise most days of the week.  Practice ways of relaxing, such as yoga or meditation.  Avoid caffeine and alcohol.  Do not use any products that contain nicotine or tobacco, such as cigarettes and e-cigarettes. If you need help quitting, ask your health care provider. Contact a health care provider if:  Your symptoms get worse or they do not improve with treatment. Summary  Restless legs syndrome is a condition that causes uncomfortable feelings or sensations in the legs, especially while sitting or lying down.  The symptoms often interfere with a person's ability to sleep.  This condition is treated by managing the symptoms. You may need to make lifestyle changes or take medicines. This information is not intended to  replace advice given to you by your health care provider. Make sure you discuss any questions you have with your health care provider. Document Released: 02/10/2002 Document Revised: 03/12/2017 Document Reviewed: 03/12/2017 Elsevier Patient Education  2020 [employer].

## 2018-10-09 NOTE — Progress Notes (Signed)
I reviewed note and agree with plan.   Penni Bombard, MD 0/09/2180, 88:33 AM Certified in Neurology, Neurophysiology and Neuroimaging  [facility] [address] [place], [place] [address] [phone]

## 2018-10-11 ENCOUNTER — Other Ambulatory Visit: Payer: Self-pay | Admitting: Family Medicine

## 2018-11-04 ENCOUNTER — Encounter: Payer: Medicare Other | Admitting: Family Medicine

## 2018-11-04 NOTE — Progress Notes (Signed)
This encounter was created in error - please disregard.

## 2018-11-04 NOTE — Progress Notes (Signed)
I have discussed the procedure for the virtual visit with the patient who has given consent to proceed with assessment and treatment.   Called pt to discuss painful/achy joints. Upon which pt stated that today was her annual exam. I advised pt that per insurance she was allowed 2 follow ups a year since pt was just seen on 08/24/18 she was not due until December (appt already scheduled)  Again tried to speak with pt about pain in bones and she advised that she spoke with neurology and if symptoms worsened or changed she would call us back but she was not interested in discussing with PCP today.   Davis Gourd, CMA

## 2018-11-07 ENCOUNTER — Other Ambulatory Visit: Payer: Self-pay | Admitting: Family Medicine

## 2018-11-10 DIAGNOSIS — Z23 Encounter for immunization: Secondary | ICD-10-CM | POA: Diagnosis not present

## 2018-11-12 ENCOUNTER — Ambulatory Visit: Payer: Self-pay | Admitting: *Deleted

## 2018-11-12 NOTE — Telephone Encounter (Signed)
I returned pt's call.    She was bitten by fire ants on Saturday mainly on her left ankle with a few bites on her right leg.   Denies having shortness of breath, or any signs of reaction on Saturday.  "They are little bumps now with what looks like pus on top but they are not draining or hurting".   I went over the home care advice using the Hydrocortisone cream 1% and taking Benadryl or other antihistamine if they start itching. I let her know the s/s to watch for in case of infection and to call us back per the protocol.    She verbalized understanding of instructions.    She did not know when her last tetanus shot was.   I let her know it was recommended she get one if she had not had one in the last 10 years.   She also verbalized understanding of this.  "I'm really not sure when I had one last but I think it's been longer than 10 years".  She was not interested in coming in for one at this time.    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 SEVERITY: "How many stings are there?"     I was stung by fire ants on Saturday.  2. ONSET: "When did it occur?"      Saturday 3. LOCATION: "Where is the sting located?"  "How many stings?"     Mostly around left ankle.   A few on right leg. 4. SWELLING: "How big is the swelling?" (e.g., inches or cm)     My leg from my knee to my ankle is swollen a little. 5. REDNESS: "Is the area red or pink?" If so, ask "What size is the area of redness?" (e.g., inches or cm). "When did the redness start?"     They are red.   They are not draining pus. 6. PAIN: "Is there any pain?" If so, ask: "How bad is it?"  (Scale 1-10; or mild, moderate, severe)     No 7. ITCHING: "Is there any itching?" If so, ask: "How bad is it?"      No 8. RESPIRATORY DISTRESS: "Describe your breathing."     No 9. PRIOR REACTIONS: "Have you had any severe allergic reactions to stings in the past?" if yes,  ask: "What happened?"     No  10. OTHER SYMPTOMS: "Do you have any other symptoms?" (e.g., abdominal pain, face or tongue swelling, new rash elsewhere,  vomiting)       No 11. PREGNANCY: "Is there any chance you are pregnant?" "When was your last menstrual period?"       N/A due to age  Protocols used: FIRE ANT [facility]

## 2018-11-16 ENCOUNTER — Other Ambulatory Visit: Payer: Self-pay | Admitting: Family Medicine

## 2018-11-19 ENCOUNTER — Other Ambulatory Visit: Payer: Self-pay | Admitting: Gastroenterology

## 2018-12-03 DIAGNOSIS — R55 Syncope and collapse: Secondary | ICD-10-CM | POA: Diagnosis not present

## 2018-12-03 DIAGNOSIS — I472 Ventricular tachycardia: Secondary | ICD-10-CM | POA: Diagnosis not present

## 2018-12-03 DIAGNOSIS — I429 Cardiomyopathy, unspecified: Secondary | ICD-10-CM | POA: Diagnosis not present

## 2018-12-03 DIAGNOSIS — I10 Essential (primary) hypertension: Secondary | ICD-10-CM | POA: Diagnosis not present

## 2018-12-03 DIAGNOSIS — R001 Bradycardia, unspecified: Secondary | ICD-10-CM | POA: Diagnosis not present

## 2018-12-11 ENCOUNTER — Other Ambulatory Visit: Payer: Self-pay | Admitting: Family Medicine

## 2018-12-17 ENCOUNTER — Other Ambulatory Visit: Payer: Self-pay | Admitting: Family Medicine

## 2018-12-17 DIAGNOSIS — Z1231 Encounter for screening mammogram for malignant neoplasm of breast: Secondary | ICD-10-CM

## 2018-12-18 DIAGNOSIS — H0100A Unspecified blepharitis right eye, upper and lower eyelids: Secondary | ICD-10-CM | POA: Diagnosis not present

## 2018-12-18 DIAGNOSIS — H0100B Unspecified blepharitis left eye, upper and lower eyelids: Secondary | ICD-10-CM | POA: Diagnosis not present

## 2018-12-18 DIAGNOSIS — H0012 Chalazion right lower eyelid: Secondary | ICD-10-CM | POA: Diagnosis not present

## 2018-12-18 DIAGNOSIS — H0014 Chalazion left upper eyelid: Secondary | ICD-10-CM | POA: Diagnosis not present

## 2018-12-27 ENCOUNTER — Other Ambulatory Visit: Payer: Self-pay | Admitting: General Practice

## 2018-12-27 DIAGNOSIS — G2581 Restless legs syndrome: Secondary | ICD-10-CM

## 2018-12-27 DIAGNOSIS — M5416 Radiculopathy, lumbar region: Secondary | ICD-10-CM | POA: Diagnosis not present

## 2018-12-27 DIAGNOSIS — M545 Low back pain: Secondary | ICD-10-CM | POA: Diagnosis not present

## 2018-12-27 MED ORDER — ASPIRIN EC 81 MG PO TBEC: 81.0000 mg | DELAYED_RELEASE_TABLET | Freq: Every day | ORAL | 0 refills | Status: DC

## 2018-12-27 MED ORDER — CYCLOBENZAPRINE HCL 10 MG PO TABS: ORAL_TABLET | ORAL | 1 refills | Status: DC

## 2018-12-27 MED ORDER — METOPROLOL SUCCINATE ER 100 MG PO TB24: ORAL_TABLET | ORAL | 0 refills | Status: DC

## 2018-12-27 MED ORDER — GABAPENTIN 300 MG PO CAPS: 600.0000 mg | ORAL_CAPSULE | Freq: Two times a day (BID) | ORAL | 0 refills | Status: DC

## 2018-12-27 MED ORDER — PRAMIPEXOLE DIHYDROCHLORIDE 1 MG PO TABS: 1.0000 mg | ORAL_TABLET | Freq: Two times a day (BID) | ORAL | 1 refills | Status: DC

## 2018-12-27 MED ORDER — FUROSEMIDE 20 MG PO TABS: 20.0000 mg | ORAL_TABLET | Freq: Every day | ORAL | 1 refills | Status: DC

## 2018-12-27 MED ORDER — CETIRIZINE HCL 10 MG PO TABS: 10.0000 mg | ORAL_TABLET | Freq: Every day | ORAL | 0 refills | Status: DC

## 2019-01-03 ENCOUNTER — Encounter: Payer: Self-pay | Admitting: Family Medicine

## 2019-01-03 ENCOUNTER — Other Ambulatory Visit: Payer: Self-pay

## 2019-01-03 ENCOUNTER — Ambulatory Visit (INDEPENDENT_AMBULATORY_CARE_PROVIDER_SITE_OTHER): Payer: Medicare Other | Admitting: Family Medicine

## 2019-01-03 DIAGNOSIS — Z20828 Contact with and (suspected) exposure to other viral communicable diseases: Secondary | ICD-10-CM | POA: Diagnosis not present

## 2019-01-03 DIAGNOSIS — Z20822 Contact with and (suspected) exposure to covid-19: Secondary | ICD-10-CM

## 2019-01-03 MED ORDER — PROMETHAZINE-DM 6.25-15 MG/5ML PO SYRP: 5.0000 mL | ORAL_SOLUTION | Freq: Four times a day (QID) | ORAL | 0 refills | Status: DC | PRN

## 2019-01-03 MED ORDER — ALBUTEROL SULFATE HFA 108 (90 BASE) MCG/ACT IN AERS: 2.0000 | INHALATION_SPRAY | Freq: Four times a day (QID) | RESPIRATORY_TRACT | 0 refills | Status: DC | PRN

## 2019-01-03 NOTE — Progress Notes (Signed)
Virtual Visit via Video   I connected with patient on 01/03/19 at  4:00 PM EDT by a video enabled telemedicine application and verified that I am speaking with the correct person using two identifiers.  Location patient: Home Location provider: Acupuncturist, Office Persons participating in the virtual visit: Patient, Provider, [place] (Jess B)  I discussed the limitations of evaluation and management by telemedicine and the availability of in person appointments. The patient expressed understanding and agreed to proceed.  Subjective:   HPI:   Cough- pt reports cough starts late afternoon, lasts 'most of the night'.  + 'really bad HA'.  No fever, no change to taste/smell.  sxs started ~3 days ago.  + body aches.  Denies sinus pain/pressure.  No sore throat.  No N/V/D.  No known sick contacts.  Denies seasonal allergies.  + SOB when coughing.  'it feels like something sitting on my chest.  It's so powerful'.  Cough is not productive.  Husband w/o sxs.  Pt reports feeling 'ok' during the day when not coughing.  HA will improve w/ OTC meds but only temporarily.  HA is all day, cough is only in the evening.  No facial pain or pressure.  Denies PND.  sxs started suddenly.  Pt reports she has been wearing a mask and only going to grocery store or pharmacy.  ROS:   See pertinent positives and negatives per HPI.  Patient Active Problem List   Diagnosis Date Noted  . Restless legs syndrome (RLS) 08/29/2016  . Lumbar radiculopathy 08/29/2016  . Obesity (BMI 30-39.9) 02/23/2016  . Localized swelling, mass and lump, neck 02/23/2016  . Neuropathy 08/13/2012  . Essential hypertension 02/05/2009  . ARTHRITIS 02/05/2009  . Dysphagia 02/05/2009  . ABDOMINAL BLOATING 02/05/2009  . ABDOMINAL PAIN, LEFT LOWER QUADRANT 02/05/2009  . ESOPHAGEAL STRICTURE 02/04/2009  . GERD 02/04/2009  . GASTRITIS 02/04/2009  . HIATAL HERNIA 02/04/2009  . DIVERTICULOSIS OF COLON 02/04/2009  . IRRITABLE BOWEL  SYNDROME 02/04/2009  . ENDOMETRIOSIS 02/04/2009    Social History   Tobacco Use  . Smoking status: Never Smoker  . Smokeless tobacco: Never Used  Substance Use Topics  . Alcohol use: No    Current Outpatient Medications:  .  aspirin EC 81 MG tablet, Take 1 tablet (81 mg total) by mouth daily., Disp: 90 tablet, Rfl: 0 .  butalbital-acetaminophen-caffeine (FIORICET, ESGIC) 50-325-40 MG tablet, 1-2 tabs po bid prn headache/body aches, Disp: 20 tablet, Rfl: 0 .  cetirizine (ZYRTEC) 10 MG tablet, Take 1 tablet (10 mg total) by mouth daily., Disp: 90 tablet, Rfl: 0 .  cyclobenzaprine (FLEXERIL) 10 MG tablet, TAKE 1 TABLET BY MOUTH EVERYDAY AT BEDTIME, Disp: 90 tablet, Rfl: 1 .  ferrous sulfate 325 (65 FE) MG tablet, Take one po TID, Disp: 90 tablet, Rfl: 3 .  Fluocinolone Acetonide (DERMOTIC) 0.01 % OIL, Place 5 drops in ear(s) 2 (two) times daily., Disp: 20 mL, Rfl: 1 .  furosemide (LASIX) 20 MG tablet, Take 1 tablet (20 mg total) by mouth daily., Disp: 90 tablet, Rfl: 1 .  gabapentin (NEURONTIN) 300 MG capsule, Take 2 capsules (600 mg total) by mouth 2 (two) times daily., Disp: 360 capsule, Rfl: 0 .  latanoprost (XALATAN) 0.005 % ophthalmic solution, Place 1 drop into both eyes at bedtime. , Disp: , Rfl:  .  losartan-hydrochlorothiazide (HYZAAR) 50-12.5 MG tablet, TAKE 1 TABLET BY MOUTH EVERY DAY, Disp: 90 tablet, Rfl: 1 .  metoprolol succinate (TOPROL-XL) 100 MG 24 hr tablet,  TAKE 1 TABLET BY MOUTH DAILY. NO FURTHER REFILLS WITHOUT AN APPT., Disp: 90 tablet, Rfl: 0 .  omeprazole (PRILOSEC) 40 MG capsule, Take 1 capsule (40 mg total) by mouth 2 (two) times daily., Disp: 120 capsule, Rfl: 3 .  pramipexole (MIRAPEX) 1 MG tablet, Take 1 tablet (1 mg total) by mouth 2 (two) times daily., Disp: 180 tablet, Rfl: 1 .  Wheat Dextrin (BENEFIBER) POWD, Take 1 tablespoon three times a day, Disp: , Rfl: 0  Allergies  Allergen Reactions  . Nitrofurantoin     Makes mouth break out/blisters  .  Propoxyphene N-Acetaminophen     REACTION: nausea  . Dexilant [Dexlansoprazole] Nausea And Vomiting  . Nexium [Esomeprazole Magnesium] Nausea And Vomiting  . Nortriptyline Hcl Nausea And Vomiting  . Hydrocodone-Homatropine Itching and Rash  . Morphine Itching and Rash    Objective:   There were no vitals taken for this visit.  AAOx3, NAD NCAT, EOMI No obvious CN deficits Coloring WNL Pt is able to speak clearly, coherently.  Some shortness of breath after hacking dry cough Thought process is linear.  Mood is appropriate.   Assessment and Plan:   Suspected COVID- new.  Pt reports she has been wearing a mask but her husband is 'not as good about it'.  He is currently asymptomatic but for 3 days she has had a dry, strong, hacking cough that starts late afternoon and lasts all night.  + body aches, + HA that lasts all day.  Denies sinus pain, nasal congestion, sore throat, N/V/D, fevers.  She is supposed to work [employer] on Tuesday.  Given this, we must know if she has COVID.  [facility] [address]).  Will start cough syrup, albuterol inhaler, alternate tylenol/ibuprofen.  Encouraged increased fluids, rest.  If worsening shortness of breath or other concerns, pt advised to go to ER this weekend.  Pt expressed understanding and is in agreement w/ plan.    [name], MD 01/03/2019

## 2019-01-03 NOTE — Progress Notes (Signed)
I have discussed the procedure for the virtual visit with the patient who has given consent to proceed with assessment and treatment.   Pt unable to obtain vitals.   Ambera Fedele L Bryli Mantey, CMA     

## 2019-01-04 DIAGNOSIS — Z20828 Contact with and (suspected) exposure to other viral communicable diseases: Secondary | ICD-10-CM | POA: Diagnosis not present

## 2019-01-10 ENCOUNTER — Other Ambulatory Visit: Payer: Self-pay

## 2019-01-10 ENCOUNTER — Other Ambulatory Visit: Payer: Self-pay | Admitting: Family Medicine

## 2019-01-10 MED ORDER — METOPROLOL SUCCINATE ER 100 MG PO TB24: ORAL_TABLET | ORAL | 0 refills | Status: DC

## 2019-01-16 DIAGNOSIS — M47816 Spondylosis without myelopathy or radiculopathy, lumbar region: Secondary | ICD-10-CM | POA: Diagnosis not present

## 2019-01-16 DIAGNOSIS — M545 Low back pain: Secondary | ICD-10-CM | POA: Diagnosis not present

## 2019-01-17 DIAGNOSIS — M545 Low back pain: Secondary | ICD-10-CM | POA: Diagnosis not present

## 2019-01-17 DIAGNOSIS — M47816 Spondylosis without myelopathy or radiculopathy, lumbar region: Secondary | ICD-10-CM | POA: Diagnosis not present

## 2019-01-19 DIAGNOSIS — I429 Cardiomyopathy, unspecified: Secondary | ICD-10-CM | POA: Diagnosis not present

## 2019-01-27 ENCOUNTER — Other Ambulatory Visit: Payer: Self-pay | Admitting: Family Medicine

## 2019-02-04 ENCOUNTER — Other Ambulatory Visit: Payer: Self-pay

## 2019-02-04 ENCOUNTER — Ambulatory Visit (INDEPENDENT_AMBULATORY_CARE_PROVIDER_SITE_OTHER): Payer: Medicare Other | Admitting: Family Medicine

## 2019-02-04 ENCOUNTER — Encounter: Payer: Self-pay | Admitting: Family Medicine

## 2019-02-04 VITALS — BP 116/63 | HR 80 | Ht 63.0 in | Wt 195.0 lb

## 2019-02-04 DIAGNOSIS — I10 Essential (primary) hypertension: Secondary | ICD-10-CM

## 2019-02-04 DIAGNOSIS — Z0000 Encounter for general adult medical examination without abnormal findings: Secondary | ICD-10-CM | POA: Diagnosis not present

## 2019-02-04 DIAGNOSIS — E669 Obesity, unspecified: Secondary | ICD-10-CM | POA: Diagnosis not present

## 2019-02-04 NOTE — Progress Notes (Signed)
Virtual Visit via Video   I connected with patient on 02/04/19 at  9:30 AM EST by a video enabled telemedicine application and verified that I am speaking with the correct person using two identifiers.  Location patient: Home Location provider: Acupuncturist, Office Persons participating in the virtual visit: Patient, Provider, [place] (Jess B)  I discussed the limitations of evaluation and management by telemedicine and the availability of in person appointments. The patient expressed understanding and agreed to proceed.  Subjective:   HPI:  Here today for MWV.  Risk Factors: HTN- chronic problem, on Lasix 20mg  daily, Losartan HCTZ 50/12.5mg  daily.  No CP, SOB, HAs, visual changes, edema. Obesity- pt has gained 5 lbs since last visit.  No regular exercise.  No particular diet b/c she gets frustrated w/ lack of results.  Pt reports eating breakfast and dinner and nothing in between. Physical Activity: no regular exercise Fall Risk: low Depression: denies current sxs Hearing: normal to conversational tones and whispered voice ADL's: independent Cognitive: normal linear thought process, memory and attention intact Home Safety: safe at home, caregiver for husband who has had strokes Height, Weight, BMI, Visual Acuity: see vitals, vision corrected to 20/20 w/ glasses Counseling: UTD on mammo, colonoscopy, flu shot, pneumonia vaccines Labs Ordered: See A&P Care Plan: See A&P   Patient Care Team    Relationship Specialty Notifications Start End  [name], MD PCP - General Family Medicine  01/17/16   Mauri Pole, MD Consulting Physician Gastroenterology  02/23/16   Penni Bombard, MD Consulting Physician Neurology  02/23/16   Harriett Sine, MD Consulting Physician Dermatology  08/30/16   Ortho, [facility]  Specialist  08/30/16      ROS:   See pertinent positives and negatives per HPI.  Patient Active Problem List   Diagnosis Date Noted  . Restless  legs syndrome (RLS) 08/29/2016  . Lumbar radiculopathy 08/29/2016  . Obesity (BMI 30-39.9) 02/23/2016  . Localized swelling, mass and lump, neck 02/23/2016  . Neuropathy 08/13/2012  . Essential hypertension 02/05/2009  . ARTHRITIS 02/05/2009  . Dysphagia 02/05/2009  . ABDOMINAL BLOATING 02/05/2009  . ABDOMINAL PAIN, LEFT LOWER QUADRANT 02/05/2009  . ESOPHAGEAL STRICTURE 02/04/2009  . GERD 02/04/2009  . GASTRITIS 02/04/2009  . HIATAL HERNIA 02/04/2009  . DIVERTICULOSIS OF COLON 02/04/2009  . IRRITABLE BOWEL SYNDROME 02/04/2009  . ENDOMETRIOSIS 02/04/2009    Social History   Tobacco Use  . Smoking status: Never Smoker  . Smokeless tobacco: Never Used  Substance Use Topics  . Alcohol use: No    Current Outpatient Medications:  .  albuterol (VENTOLIN HFA) 108 (90 Base) MCG/ACT inhaler, Inhale 2 puffs into the lungs every 6 (six) hours as needed for wheezing or shortness of breath., Disp: 18 g, Rfl: 0 .  aspirin EC 81 MG tablet, Take 1 tablet (81 mg total) by mouth daily., Disp: 90 tablet, Rfl: 0 .  butalbital-acetaminophen-caffeine (FIORICET, ESGIC) 50-325-40 MG tablet, 1-2 tabs po bid prn headache/body aches, Disp: 20 tablet, Rfl: 0 .  cetirizine (ZYRTEC) 10 MG tablet, Take 1 tablet (10 mg total) by mouth daily., Disp: 90 tablet, Rfl: 0 .  cyclobenzaprine (FLEXERIL) 10 MG tablet, TAKE 1 TABLET BY MOUTH EVERYDAY AT BEDTIME, Disp: 90 tablet, Rfl: 1 .  ferrous sulfate 325 (65 FE) MG tablet, Take one po TID, Disp: 90 tablet, Rfl: 3 .  Fluocinolone Acetonide (DERMOTIC) 0.01 % OIL, Place 5 drops in ear(s) 2 (two) times daily., Disp: 20 mL, Rfl: 1 .  furosemide (LASIX) 20 MG tablet, Take 1 tablet (20 mg total) by mouth daily., Disp: 90 tablet, Rfl: 1 .  gabapentin (NEURONTIN) 300 MG capsule, Take 2 capsules (600 mg total) by mouth 2 (two) times daily., Disp: 360 capsule, Rfl: 0 .  latanoprost (XALATAN) 0.005 % ophthalmic solution, Place 1 drop into both eyes at bedtime. , Disp: , Rfl:  .   losartan-hydrochlorothiazide (HYZAAR) 50-12.5 MG tablet, TAKE 1 TABLET BY MOUTH EVERY DAY, Disp: 90 tablet, Rfl: 1 .  metoprolol succinate (TOPROL-XL) 100 MG 24 hr tablet, TAKE 1 TABLET BY MOUTH DAILY. NO FURTHER REFILLS WITHOUT AN APPT., Disp: 90 tablet, Rfl: 1 .  omeprazole (PRILOSEC) 40 MG capsule, Take 1 capsule (40 mg total) by mouth 2 (two) times daily., Disp: 120 capsule, Rfl: 3 .  pramipexole (MIRAPEX) 1 MG tablet, Take 1 tablet (1 mg total) by mouth 2 (two) times daily., Disp: 180 tablet, Rfl: 1 .  promethazine-dextromethorphan (PROMETHAZINE-DM) 6.25-15 MG/5ML syrup, Take 5 mLs by mouth 4 (four) times daily as needed., Disp: 120 mL, Rfl: 0 .  Wheat Dextrin (BENEFIBER) POWD, Take 1 tablespoon three times a day, Disp: , Rfl: 0  Allergies  Allergen Reactions  . Nitrofurantoin     Makes mouth break out/blisters  . Propoxyphene N-Acetaminophen     REACTION: nausea  . Dexilant [Dexlansoprazole] Nausea And Vomiting  . Nexium [Esomeprazole Magnesium] Nausea And Vomiting  . Nortriptyline Hcl Nausea And Vomiting  . Hydrocodone-Homatropine Itching and Rash  . Morphine Itching and Rash    Objective:   BP 116/63   Pulse 80   Ht 5\' 3"  (1.6 m)   Wt 195 lb (88.5 kg)   BMI 34.54 kg/m   AAOx3, NAD NCAT, EOMI No obvious CN deficits Coloring WNL Pt is able to speak clearly, coherently without shortness of breath or increased work of breathing.  Thought process is linear.  Mood is appropriate.   Assessment and Plan:   HTN- chronic problem, well controlled.  Asymptomatic.  Check labs.  No anticipated med changes  Obesity- deteriorated.  Pt has gained 5 lbs.  Stressed need for healthy diet and regular exercise.  Check labs to risk stratify.  Will follow.   [name], MD 02/04/2019

## 2019-02-07 ENCOUNTER — Ambulatory Visit: Payer: Medicare Other

## 2019-02-10 ENCOUNTER — Other Ambulatory Visit: Payer: Self-pay

## 2019-02-10 ENCOUNTER — Ambulatory Visit (INDEPENDENT_AMBULATORY_CARE_PROVIDER_SITE_OTHER): Payer: Medicare Other

## 2019-02-10 DIAGNOSIS — I10 Essential (primary) hypertension: Secondary | ICD-10-CM | POA: Diagnosis not present

## 2019-02-10 DIAGNOSIS — E669 Obesity, unspecified: Secondary | ICD-10-CM | POA: Diagnosis not present

## 2019-02-10 LAB — LIPID PANEL
Cholesterol: 156 mg/dL (ref 0–200)
HDL: 43.2 mg/dL (ref >39.00)
LDL Cholesterol: 89 mg/dL (ref 0–99)
NonHDL: 112.79
Total CHOL/HDL Ratio: 4
Triglycerides: 119 mg/dL (ref 0.0–149.0)
VLDL: 23.8 mg/dL (ref 0.0–40.0)

## 2019-02-10 LAB — HEPATIC FUNCTION PANEL
ALT: 31 U/L (ref 0–35)
AST: 28 U/L (ref 0–37)
Albumin: 3.8 g/dL (ref 3.5–5.2)
Alkaline Phosphatase: 107 U/L (ref 39–117)
Bilirubin, Direct: 0.1 mg/dL (ref 0.0–0.3)
Total Bilirubin: 0.4 mg/dL (ref 0.2–1.2)
Total Protein: 6 g/dL (ref 6.0–8.3)

## 2019-02-10 LAB — BASIC METABOLIC PANEL
BUN: 15 mg/dL (ref 6–23)
CO2: 33 mEq/L — ABNORMAL HIGH (ref 19–32)
Calcium: 9.3 mg/dL (ref 8.4–10.5)
Chloride: 103 mEq/L (ref 96–112)
Creatinine, Ser: 0.93 mg/dL (ref 0.40–1.20)
GFR: 59.12 mL/min — ABNORMAL LOW (ref >60.00)
Glucose, Bld: 92 mg/dL (ref 70–99)
Potassium: 4.2 mEq/L (ref 3.5–5.1)
Sodium: 143 mEq/L (ref 135–145)

## 2019-02-10 LAB — CBC WITH DIFFERENTIAL/PLATELET
Basophils Absolute: 0.1 10*3/uL (ref 0.0–0.1)
Basophils Relative: 1.2 % (ref 0.0–3.0)
Eosinophils Absolute: 0.2 10*3/uL (ref 0.0–0.7)
Eosinophils Relative: 2.1 % (ref 0.0–5.0)
HCT: 40.3 % (ref 36.0–46.0)
Hemoglobin: 13.3 g/dL (ref 12.0–15.0)
Lymphocytes Relative: 26.2 % (ref 12.0–46.0)
Lymphs Abs: 2 10*3/uL (ref 0.7–4.0)
MCHC: 32.9 g/dL (ref 30.0–36.0)
MCV: 89.6 fl (ref 78.0–100.0)
Monocytes Absolute: 0.4 10*3/uL (ref 0.1–1.0)
Monocytes Relative: 5.2 % (ref 3.0–12.0)
Neutro Abs: 4.9 10*3/uL (ref 1.4–7.7)
Neutrophils Relative %: 65.3 % (ref 43.0–77.0)
Platelets: 293 10*3/uL (ref 150.0–400.0)
RBC: 4.5 Mil/uL (ref 3.87–5.11)
RDW: 14.5 % (ref 11.5–15.5)
WBC: 7.5 10*3/uL (ref 4.0–10.5)

## 2019-02-10 LAB — TSH: TSH: 2.69 u[IU]/mL (ref 0.35–4.50)

## 2019-02-11 ENCOUNTER — Encounter: Payer: Self-pay | Admitting: General Practice

## 2019-02-11 DIAGNOSIS — M47816 Spondylosis without myelopathy or radiculopathy, lumbar region: Secondary | ICD-10-CM | POA: Diagnosis not present

## 2019-02-11 DIAGNOSIS — M545 Low back pain: Secondary | ICD-10-CM | POA: Diagnosis not present

## 2019-02-13 DIAGNOSIS — H401232 Low-tension glaucoma, bilateral, moderate stage: Secondary | ICD-10-CM | POA: Diagnosis not present

## 2019-02-17 ENCOUNTER — Telehealth: Payer: Self-pay | Admitting: Family Medicine

## 2019-02-17 NOTE — Telephone Encounter (Signed)
Called and left a detailed message on VM as requested by pt to inform of PCP recommendations.

## 2019-02-17 NOTE — Telephone Encounter (Signed)
Pt called in asking if she should get the life line test, per. artrial abd and  A-fib test done? Pt can be reached at the home # ok to LM if no answer.

## 2019-02-17 NOTE — Telephone Encounter (Signed)
That is up to her.  When she comes to the office and we listen to her heart that is screening for Afib.  The other testing would be her decision

## 2019-02-17 NOTE — Telephone Encounter (Signed)
Please advise 

## 2019-03-10 ENCOUNTER — Other Ambulatory Visit: Payer: Self-pay

## 2019-03-10 ENCOUNTER — Ambulatory Visit
Admission: RE | Admit: 2019-03-10 | Discharge: 2019-03-10 | Disposition: A | Discharge status: Home / Self Care | Payer: Medicare Other | Source: Ambulatory Visit | Attending: Family Medicine | Admitting: Family Medicine

## 2019-03-10 DIAGNOSIS — M545 Low back pain: Secondary | ICD-10-CM | POA: Diagnosis not present

## 2019-03-10 DIAGNOSIS — Z1231 Encounter for screening mammogram for malignant neoplasm of breast: Secondary | ICD-10-CM

## 2019-03-25 DIAGNOSIS — M545 Low back pain: Secondary | ICD-10-CM | POA: Diagnosis not present

## 2019-03-25 DIAGNOSIS — M47816 Spondylosis without myelopathy or radiculopathy, lumbar region: Secondary | ICD-10-CM | POA: Diagnosis not present

## 2019-04-04 DIAGNOSIS — M47816 Spondylosis without myelopathy or radiculopathy, lumbar region: Secondary | ICD-10-CM | POA: Diagnosis not present

## 2019-04-04 DIAGNOSIS — M545 Low back pain: Secondary | ICD-10-CM | POA: Diagnosis not present

## 2019-04-11 DIAGNOSIS — Z23 Encounter for immunization: Secondary | ICD-10-CM | POA: Diagnosis not present

## 2019-04-17 ENCOUNTER — Telehealth: Payer: Self-pay

## 2019-04-17 NOTE — Telephone Encounter (Signed)
Patient called in stating that her insurance no longer covers Andrea Byrd. Requesting a paper script to be printed so she can take to a new pharmacy to pay out of pocket. Advised patient I would send message to PCP. Please advise

## 2019-04-17 NOTE — Telephone Encounter (Signed)
Called and advised pt she is willing to try the tizanidine. [facility]

## 2019-04-17 NOTE — Telephone Encounter (Signed)
Would pt prefer switching to Tizanidine as this is typically covered by Medicare?

## 2019-04-18 MED ORDER — TIZANIDINE HCL 4 MG PO TABS: 4.0000 mg | ORAL_TABLET | Freq: Three times a day (TID) | ORAL | 1 refills | Status: DC | PRN

## 2019-04-18 NOTE — Telephone Encounter (Signed)
Ok for Tizanidine 4mg  TID PRN spasm, #45, 1 refill

## 2019-04-18 NOTE — Addendum Note (Signed)
Addended by: Davis Gourd on: 04/18/2019 10:07 AM   Modules accepted: Orders

## 2019-04-18 NOTE — Telephone Encounter (Signed)
Medication filled to pharmacy as requested.   

## 2019-04-21 DIAGNOSIS — M545 Low back pain: Secondary | ICD-10-CM | POA: Diagnosis not present

## 2019-04-21 DIAGNOSIS — M47816 Spondylosis without myelopathy or radiculopathy, lumbar region: Secondary | ICD-10-CM | POA: Diagnosis not present

## 2019-04-28 DIAGNOSIS — M545 Low back pain: Secondary | ICD-10-CM | POA: Diagnosis not present

## 2019-04-28 DIAGNOSIS — M47816 Spondylosis without myelopathy or radiculopathy, lumbar region: Secondary | ICD-10-CM | POA: Diagnosis not present

## 2019-04-29 ENCOUNTER — Other Ambulatory Visit: Payer: Self-pay | Admitting: Family Medicine

## 2019-05-08 DIAGNOSIS — M545 Low back pain: Secondary | ICD-10-CM | POA: Diagnosis not present

## 2019-05-08 DIAGNOSIS — M47816 Spondylosis without myelopathy or radiculopathy, lumbar region: Secondary | ICD-10-CM | POA: Diagnosis not present

## 2019-05-10 DIAGNOSIS — Z23 Encounter for immunization: Secondary | ICD-10-CM | POA: Diagnosis not present

## 2019-05-17 ENCOUNTER — Other Ambulatory Visit: Payer: Self-pay | Admitting: Family Medicine

## 2019-05-29 DIAGNOSIS — L570 Actinic keratosis: Secondary | ICD-10-CM | POA: Diagnosis not present

## 2019-05-29 DIAGNOSIS — D485 Neoplasm of uncertain behavior of skin: Secondary | ICD-10-CM | POA: Diagnosis not present

## 2019-06-03 DIAGNOSIS — M25562 Pain in left knee: Secondary | ICD-10-CM | POA: Diagnosis not present

## 2019-06-07 ENCOUNTER — Other Ambulatory Visit: Payer: Self-pay | Admitting: Family Medicine

## 2019-06-12 ENCOUNTER — Telehealth: Payer: Self-pay | Admitting: Family Medicine

## 2019-06-12 NOTE — Progress Notes (Signed)
  Chronic Care Management   Note  06/12/2019 Name: Andrea Byrd MRN: [id number] DOB: [birth year]  Andrea Byrd is a 74 y.o. year old female who is a primary care patient of Birdie Riddle, Aundra Millet, MD. I reached out to [name] by phone today in response to a referral sent by [name]'s PCP, [name], MD.   Ms. Biebel was given information about Chronic Care Management services today including:  1. CCM service includes personalized support from designated clinical staff supervised by her physician, including individualized plan of care and coordination with other care providers 2. 24/7 contact phone numbers for assistance for urgent and routine care needs. 3. Service will only be billed when office clinical staff spend 20 minutes or more in a month to coordinate care. 4. Only one practitioner may furnish and bill the service in a calendar month. 5. The patient may stop CCM services at any time (effective at the end of the month) by phone call to the office staff.   Patient agreed to services and verbal consent obtained.   Follow up plan:   Earney Hamburg Upstream Scheduler

## 2019-06-20 DIAGNOSIS — H401232 Low-tension glaucoma, bilateral, moderate stage: Secondary | ICD-10-CM | POA: Diagnosis not present

## 2019-06-24 ENCOUNTER — Other Ambulatory Visit: Payer: Self-pay | Admitting: Family Medicine

## 2019-06-24 ENCOUNTER — Other Ambulatory Visit: Payer: Self-pay | Admitting: General Practice

## 2019-06-24 ENCOUNTER — Other Ambulatory Visit: Payer: Self-pay | Admitting: Gastroenterology

## 2019-06-24 DIAGNOSIS — E669 Obesity, unspecified: Secondary | ICD-10-CM

## 2019-06-24 DIAGNOSIS — I10 Essential (primary) hypertension: Secondary | ICD-10-CM

## 2019-06-26 DIAGNOSIS — M545 Low back pain: Secondary | ICD-10-CM | POA: Diagnosis not present

## 2019-06-26 DIAGNOSIS — M47816 Spondylosis without myelopathy or radiculopathy, lumbar region: Secondary | ICD-10-CM | POA: Diagnosis not present

## 2019-07-01 ENCOUNTER — Other Ambulatory Visit: Payer: Self-pay

## 2019-07-01 MED ORDER — LOSARTAN POTASSIUM-HCTZ 50-12.5 MG PO TABS: 1.0000 | ORAL_TABLET | Freq: Every day | ORAL | 0 refills | Status: DC

## 2019-07-02 ENCOUNTER — Other Ambulatory Visit: Payer: Self-pay

## 2019-07-02 ENCOUNTER — Ambulatory Visit: Payer: Medicare HMO | Admitting: Gastroenterology

## 2019-07-02 ENCOUNTER — Encounter: Payer: Self-pay | Admitting: Gastroenterology

## 2019-07-02 VITALS — BP 100/60 | HR 68 | Temp 97.4°F | Ht 63.0 in | Wt 192.0 lb

## 2019-07-02 DIAGNOSIS — R1013 Epigastric pain: Secondary | ICD-10-CM | POA: Diagnosis not present

## 2019-07-02 DIAGNOSIS — R1031 Right lower quadrant pain: Secondary | ICD-10-CM

## 2019-07-02 DIAGNOSIS — K5904 Chronic idiopathic constipation: Secondary | ICD-10-CM | POA: Diagnosis not present

## 2019-07-02 DIAGNOSIS — K219 Gastro-esophageal reflux disease without esophagitis: Secondary | ICD-10-CM

## 2019-07-02 MED ORDER — SUCRALFATE 1 G PO TABS: 1.0000 g | ORAL_TABLET | Freq: Two times a day (BID) | ORAL | 2 refills | Status: DC

## 2019-07-02 MED ORDER — DEXILANT 60 MG PO CPDR: 60.0000 mg | DELAYED_RELEASE_CAPSULE | Freq: Every day | ORAL | 3 refills | Status: DC

## 2019-07-02 NOTE — Progress Notes (Signed)
Andrea Byrd    CE:[id number]    [birth year]  Primary Care Physician:Tabori, Aundra Millet, MD  Referring Physician: [name], MD [address] [place],  [place] [address]   Chief complaint:  GERD, RLQ abd pain  HPI:  39 yr very pleasant female here for follow up visit for GERD. Last office visit in 10/2016  She is currently taking Omeprazole twice daily but continues to have persistent breakthrough symptoms esp regurgitation, acid taste and heartburn at night time, wakes her up from sleep.  She started having RLQ abd pain worse when she lays on the side, walks or does activity.  Denies any nausea, vomiting, abdominal pain, melena or bright red blood per rectum   EGD August 21, 2016: LA grade C esophagitis, esophageal stricture dilated with 20 mm TTS balloon.  Gastritis  Colonoscopy February 09, 2012: Diverticulosis, sigmoid anastomosis.  Unable to retroflex in rectum due to small rectal vault.  Outpatient Encounter Medications as of 07/02/2019  Medication Sig  . albuterol (VENTOLIN HFA) 108 (90 Base) MCG/ACT inhaler Inhale 2 puffs into the lungs every 6 (six) hours as needed for wheezing or shortness of breath.  Marland Kitchen aspirin EC 81 MG tablet Take 1 tablet (81 mg total) by mouth daily.  . butalbital-acetaminophen-caffeine (FIORICET, ESGIC) 50-325-40 MG tablet 1-2 tabs po bid prn headache/body aches  . cetirizine (ZYRTEC) 10 MG tablet Take 1 tablet (10 mg total) by mouth daily.  . cyclobenzaprine (FLEXERIL) 10 MG tablet TAKE 1 TABLET BY MOUTH EVERYDAY AT BEDTIME  . ferrous sulfate 325 (65 FE) MG tablet Take one po TID  . Fluocinolone Acetonide (DERMOTIC) 0.01 % OIL Place 5 drops in ear(s) 2 (two) times daily.  . furosemide (LASIX) 20 MG tablet Take 1 tablet (20 mg total) by mouth daily.  Marland Kitchen gabapentin (NEURONTIN) 300 MG capsule Take 2 capsules (600 mg total) by mouth 2 (two) times daily.  Marland Kitchen latanoprost (XALATAN) 0.005 % ophthalmic solution Place 1 drop into both eyes  at bedtime.   Marland Kitchen losartan-hydrochlorothiazide (HYZAAR) 50-12.5 MG tablet Take 1 tablet by mouth daily.  . metoprolol succinate (TOPROL-XL) 100 MG 24 hr tablet TAKE 1 TABLET BY MOUTH DAILY. NO FURTHER REFILLS WITHOUT AN APPT.  Marland Kitchen omeprazole (PRILOSEC) 40 MG capsule Take 1 capsule (40 mg total) by mouth 2 (two) times daily.  . pramipexole (MIRAPEX) 1 MG tablet Take 1 tablet (1 mg total) by mouth 2 (two) times daily.  . promethazine-dextromethorphan (PROMETHAZINE-DM) 6.25-15 MG/5ML syrup Take 5 mLs by mouth 4 (four) times daily as needed.  Marland Kitchen tiZANidine (ZANAFLEX) 4 MG tablet TAKE 1 TABLET (4 MG TOTAL) BY MOUTH 3 (THREE) TIMES DAILY AS NEEDED FOR MUSCLE SPASMS.  . Wheat Dextrin (BENEFIBER) POWD Take 1 tablespoon three times a day   No facility-administered encounter medications on file as of 07/02/2019.    Allergies as of 07/02/2019 - Review Complete 02/04/2019  Allergen Reaction Noted  . Nitrofurantoin  02/04/2009  . Propoxyphene n-acetaminophen  02/04/2009  . Dexilant [dexlansoprazole] Nausea And Vomiting 08/29/2016  . Nexium [esomeprazole magnesium] Nausea And Vomiting 08/29/2016  . Nortriptyline hcl Nausea And Vomiting 08/29/2016  . Hydrocodone-homatropine Itching and Rash 09/01/2015  . Morphine Itching and Rash     Past Medical History:  Diagnosis Date  . Anemia   . Arthritis   . Cervical cancer ([place]) 1977  . Diverticulosis   . Endometriosis   . Esophageal stricture   . Gastritis   .  GERD (gastroesophageal reflux disease)   . Glaucoma   . Herpes simplex type 1 infection   . Hiatal hernia   . History of colon polyps   . History of gallstones   . Hypertension   . IBS (irritable bowel syndrome)   . Insomnia   . Post-operative nausea and vomiting   . Restless leg syndrome     Past Surgical History:  Procedure Laterality Date  . ABDOMINAL HYSTERECTOMY  1998  . APPENDECTOMY  1998  . CERVICAL CONE BIOPSY    . CHOLECYSTECTOMY  1980  . COLON RESECTION  2002  . COLON SURGERY   2003   removed 2 feet colon  . ESOPHAGEAL MANOMETRY N/A 04/24/2016   Procedure: ESOPHAGEAL MANOMETRY (EM);  Surgeon: Mauri Pole, MD;  Location: [facility];  Service: Endoscopy;  Laterality: N/A;  . HAND SURGERY     right/ thumb joints replaced both hands  . OOPHORECTOMY    . TUBAL LIGATION      Family History  Problem Relation Age of Onset  . Diabetes Mother   . Stroke Mother        brother, sister, MGM  . Colon polyps Mother   . Heart disease Father   . Kidney disease Father   . Colon polyps Father   . Lung disease Father   . Kidney cancer Sister   . Stroke Sister   . Diabetes Sister   . Colon polyps Sister   . Celiac disease Other        niece  . Heart attack Paternal Grandfather   . Colon cancer Neg Hx   . Esophageal cancer Neg Hx   . Rectal cancer Neg Hx   . Stomach cancer Neg Hx     Social History   Socioeconomic History  . Marital status: Married    Spouse name: Not on file  . Number of children: 1  . Years of education: Not on file  . Highest education level: Not on file  Occupational History  . Occupation: Retired  Tobacco Use  . Smoking status: Never Smoker  . Smokeless tobacco: Never Used  Substance and Sexual Activity  . Alcohol use: No  . Drug use: No  . Sexual activity: Not on file  Other Topics Concern  . Not on file  Social History Narrative   Lives with husband floyd   Drinks maybe 4 sodas a week   Social Determinants of Health   Financial Resource Strain:   . Difficulty of Paying Living Expenses:   Food Insecurity:   . Worried About Charity fundraiser in the Last Year:   . Arboriculturist in the Last Year:   Transportation Needs:   . Film/video editor (Medical):   Marland Kitchen Lack of Transportation (Non-Medical):   Physical Activity:   . Days of Exercise per Week:   . Minutes of Exercise per Session:   Stress:   . Feeling of Stress :   Social Connections:   . Frequency of Communication with Friends and Family:   .  Frequency of Social Gatherings with Friends and Family:   . Attends Religious Services:   . Active Member of Clubs or Organizations:   . Attends Archivist Meetings:   Marland Kitchen Marital Status:   Intimate Partner Violence:   . Fear of Current or Ex-Partner:   . Emotionally Abused:   Marland Kitchen Physically Abused:   . Sexually Abused:       Review of systems: All  other review of systems negative except as mentioned in the HPI.   Physical Exam: Vitals:   07/02/19 1027  BP: 100/60  Pulse: 68  Temp: (!) 97.4 F (36.3 C)   Body mass index is 34.01 kg/m. Gen:      No acute distress Abd:      soft, non-tender; no palpable masses, no distension Ext:    No edema; adequate peripheral perfusion Skin:      Warm and dry; no rash Neuro:   alert and oriented x 3 Psych: normal mood and affect  Data Reviewed:  Reviewed labs, radiology imaging, old records and pertinent past GI work up   Assessment and Plan/Recommendations:  15 yr F with h/o hiatal hernia, chronic GERD with erosive esophagitis and gastritis  GERD, epigastric abd pain: Persistent symptoms despite PPI use (Omeprazole BID) Switch to Dexilant 60mg  daily Carafate 1gm BID Antireflux measures If persistent symptoms, will consider EGD for further evaluation  Chronic idiopathic Constipation: Increase dietary fiber and fluid intake Benefiber 1 teaspoon BID  RLQ: likely secondary to R hip arthritis/musculoskeletal pain based on history. If persistent symptoms consider further imaging, referral to Dr Charlann Boxer  The patient was provided an opportunity to ask questions and all were answered. The patient agreed with the plan and demonstrated an understanding of the instructions.  Damaris Hippo , MD    CC: [name], MD

## 2019-07-02 NOTE — Patient Instructions (Signed)
We have sent the following medications to your pharmacy for you to pick up at your convenience: Dexilant , Carafate   Start: Dexilant- 60mg   once daily.   Carafate- 1 gram tablet twice daily.   Benefiber- 1 teaspoon twice daily as tolerated.   Please see anti reflux measures-  Gastroesophageal Reflux Disease, Adult Gastroesophageal reflux (GER) happens when acid from the stomach flows up into the tube that connects the mouth and the stomach (esophagus). Normally, food travels down the esophagus and stays in the stomach to be digested. However, when a person has GER, food and stomach acid sometimes move back up into the esophagus. If this becomes a more serious problem, the person may be diagnosed with a disease called gastroesophageal reflux disease (GERD). GERD occurs when the reflux:  Happens often.  Causes frequent or severe symptoms.  Causes problems such as damage to the esophagus. When stomach acid comes in contact with the esophagus, the acid may cause soreness (inflammation) in the esophagus. Over time, GERD may create small holes (ulcers) in the lining of the esophagus. What are the causes? This condition is caused by a problem with the muscle between the esophagus and the stomach (lower esophageal sphincter, or LES). Normally, the LES muscle closes after food passes through the esophagus to the stomach. When the LES is weakened or abnormal, it does not close properly, and that allows food and stomach acid to go back up into the esophagus. The LES can be weakened by certain dietary substances, medicines, and medical conditions, including:  Tobacco use.  Pregnancy.  Having a hiatal hernia.  Alcohol use.  Certain foods and beverages, such as coffee, chocolate, onions, and peppermint. What increases the risk? You are more likely to develop this condition if you:  Have an increased body weight.  Have a connective tissue disorder.  Use NSAID medicines. What are the signs  or symptoms? Symptoms of this condition include:  Heartburn.  Difficult or painful swallowing.  The feeling of having a lump in the throat.  Abitter taste in the mouth.  Bad breath.  Having a large amount of saliva.  Having an upset or bloated stomach.  Belching.  Chest pain. Different conditions can cause chest pain. Make sure you see your health care provider if you experience chest pain.  Shortness of breath or wheezing.  Ongoing (chronic) cough or a night-time cough.  Wearing away of tooth enamel.  Weight loss. How is this diagnosed? Your health care provider will take a medical history and perform a physical exam. To determine if you have mild or severe GERD, your health care provider may also monitor how you respond to treatment. You may also have tests, including:  A test to examine your stomach and esophagus with a small camera (endoscopy).  A test thatmeasures the acidity level in your esophagus.  A test thatmeasures how much pressure is on your esophagus.  A barium swallow or modified barium swallow test to show the shape, size, and functioning of your esophagus. How is this treated? The goal of treatment is to help relieve your symptoms and to prevent complications. Treatment for this condition may vary depending on how severe your symptoms are. Your health care provider may recommend:  Changes to your diet.  Medicine.  Surgery. Follow these instructions at home: Eating and drinking   Follow a diet as recommended by your health care provider. This may involve avoiding foods and drinks such as: ? Coffee and tea (with or without caffeine). ?  Drinks that containalcohol. ? Energy drinks and sports drinks. ? Carbonated drinks or sodas. ? Chocolate and cocoa. ? Peppermint and mint flavorings. ? Garlic and onions. ? Horseradish. ? Spicy and acidic foods, including peppers, chili powder, curry powder, vinegar, hot sauces, and barbecue sauce. ? Citrus  fruit juices and citrus fruits, such as oranges, lemons, and limes. ? Tomato-based foods, such as red sauce, chili, salsa, and pizza with red sauce. ? Fried and fatty foods, such as donuts, french fries, potato chips, and high-fat dressings. ? High-fat meats, such as hot dogs and fatty cuts of red and white meats, such as rib eye steak, sausage, ham, and bacon. ? High-fat dairy items, such as whole milk, butter, and cream cheese.  Eat small, frequent meals instead of large meals.  Avoid drinking large amounts of liquid with your meals.  Avoid eating meals during the 2-3 hours before bedtime.  Avoid lying down right after you eat.  Do not exercise right after you eat. Lifestyle   Do not use any products that contain nicotine or tobacco, such as cigarettes, e-cigarettes, and chewing tobacco. If you need help quitting, ask your health care provider.  Try to reduce your stress by using methods such as yoga or meditation. If you need help reducing stress, ask your health care provider.  If you are overweight, reduce your weight to an amount that is healthy for you. Ask your health care provider for guidance about a safe weight loss goal. General instructions  Pay attention to any changes in your symptoms.  Take over-the-counter and prescription medicines only as told by your health care provider. Do not take aspirin, ibuprofen, or other NSAIDs unless your health care provider told you to do so.  Wear loose-fitting clothing. Do not wear anything tight around your waist that causes pressure on your abdomen.  Raise (elevate) the head of your bed about 6 inches (15 cm).  Avoid bending over if this makes your symptoms worse.  Keep all follow-up visits as told by your health care provider. This is important. Contact a health care provider if:  You have: ? New symptoms. ? Unexplained weight loss. ? Difficulty swallowing or it hurts to swallow. ? Wheezing or a persistent cough. ? A  hoarse voice.  Your symptoms do not improve with treatment. Get help right away if you:  Have pain in your arms, neck, jaw, teeth, or back.  Feel sweaty, dizzy, or light-headed.  Have chest pain or shortness of breath.  Vomit and your vomit looks like blood or coffee grounds.  Faint.  Have stool that is bloody or black.  Cannot swallow, drink, or eat. Summary  Gastroesophageal reflux happens when acid from the stomach flows up into the esophagus. GERD is a disease in which the reflux happens often, causes frequent or severe symptoms, or causes problems such as damage to the esophagus.  Treatment for this condition may vary depending on how severe your symptoms are. Your health care provider may recommend diet and lifestyle changes, medicine, or surgery.  Contact a health care provider if you have new or worsening symptoms.  Take over-the-counter and prescription medicines only as told by your health care provider. Do not take aspirin, ibuprofen, or other NSAIDs unless your health care provider told you to do so.  Keep all follow-up visits as told by your health care provider. This is important. This information is not intended to replace advice given to you by your health care provider. Make sure you discuss  any questions you have with your health care provider. Document Revised: 08/29/2017 Document Reviewed: 08/29/2017 Elsevier Patient Education  [employer].   If you are age 32 or older, your body mass index should be between 23-30. Your Body mass index is 34.01 kg/m. If this is out of the aforementioned range listed, please consider follow up with your Primary Care Provider.  If you are age 68 or younger, your body mass index should be between 19-25. Your Body mass index is 34.01 kg/m. If this is out of the aformentioned range listed, please consider follow up with your Primary Care Provider.  . Thank you for choosing me and [facility].  Dr. Silverio Decamp

## 2019-07-03 ENCOUNTER — Telehealth: Payer: Medicare HMO

## 2019-07-03 NOTE — Progress Notes (Unsigned)
Chronic Care Management Pharmacy  Name: AIDE DAPONTE  MRN: [id number] DOB: 08/10/1945  Chief Complaint/ HPI Andrea Byrd,  74 y.o. , female presents for their Initial CCM visit with the clinical pharmacist via telephone due to COVID-19 Pandemic.  PCP : [name], MD  Their chronic conditions include: GERD, HTN, Obesity, RLS.   Consult Visit:  4/28(Gastro, Dr. Karleen Hampshire): start dexlansoprazole 60 mg daily, sucralfate 1 gram BID   Medications: Outpatient Encounter Medications as of 07/03/2019  Medication Sig  . aspirin EC 81 MG tablet Take 1 tablet (81 mg total) by mouth daily.  . cyclobenzaprine (FLEXERIL) 10 MG tablet TAKE 1 TABLET BY MOUTH EVERYDAY AT BEDTIME  . dexlansoprazole (DEXILANT) 60 MG capsule Take 1 capsule (60 mg total) by mouth daily.  . furosemide (LASIX) 20 MG tablet Take 1 tablet (20 mg total) by mouth daily.  Marland Kitchen gabapentin (NEURONTIN) 300 MG capsule Take 2 capsules (600 mg total) by mouth 2 (two) times daily.  Marland Kitchen latanoprost (XALATAN) 0.005 % ophthalmic solution Place 1 drop into both eyes at bedtime.   Marland Kitchen losartan-hydrochlorothiazide (HYZAAR) 50-12.5 MG tablet Take 1 tablet by mouth daily.  . metoprolol succinate (TOPROL-XL) 100 MG 24 hr tablet TAKE 1 TABLET BY MOUTH DAILY. NO FURTHER REFILLS WITHOUT AN APPT.  . sucralfate (CARAFATE) 1 g tablet Take 1 tablet (1 g total) by mouth 2 (two) times daily.  Marland Kitchen tiZANidine (ZANAFLEX) 4 MG tablet TAKE 1 TABLET (4 MG TOTAL) BY MOUTH 3 (THREE) TIMES DAILY AS NEEDED FOR MUSCLE SPASMS.   No facility-administered encounter medications on file as of 07/03/2019.   Current Diagnosis/Assessment: Goals Addressed            This Visit's Progress   . BP <140/90      . GERD: Minimize recurring symptoms        Hypertension   BP today is:  {CHL HP UPSTREAM Pharmacist BP ranges:[phone]}  Office blood pressures are  BP Readings from Last 3 Encounters:  07/02/19 100/60  02/04/19 116/63  10/07/18 121/72   Patient is  currently {CHL Controlled/[phone]} on the following medications:   Losartan-hctz 50-12.5 mg daily  Furosemide 20 mg daily   Metoprolol succinate 100 mg daily***?  Patient has failed these meds in the past: ***  Patient checks BP at home {CHL HP BP Monitoring Frequency:[phone]}  Patient home BP readings are ranging: ***  We discussed {CHL HP Upstream Pharmacy discussion:[phone]}  Plan Continue {CHL HP Upstream Pharmacy Plans:[phone]}     Hyperlipidemia   Lipid Panel     Component Value Date/Time   CHOL 156 02/10/2019 0920   TRIG 119.0 02/10/2019 0920   HDL 43.20 02/10/2019 0920   CHOLHDL 4 02/10/2019 0920   VLDL 23.8 02/10/2019 0920   LDLCALC 89 02/10/2019 0920     The 10-year ASCVD risk score Mikey Bussing DC Jr., et al., 2013) is: 10.5%   Values used to calculate the score:     Age: 8 years     Sex: Female     Is Non-Hispanic African American: No     Diabetic: No     Tobacco smoker: No     Systolic Blood Pressure: 123XX123 mmHg     Is BP treated: Yes     HDL Cholesterol: 43.2 mg/dL     Total Cholesterol: 156 mg/dL   Patient has failed these meds in past: *** Patient is currently {CHL Controlled/[phone]} on the following medications: ***  We discussed:  {CHL HP Upstream Pharmacy  [phone]}  Plan  Continue {CHL HP Upstream Pharmacy Plans:[phone]}  GERD   Patient is currently {CHL Controlled/[phone]} on the following medications: ***  Plan  {rxplan:23810::"Continue current medication."}   ***   Patient has failed these meds in past: *** Patient is currently {CHL Controlled/[phone]} on the following medications: ***  We discussed:  ***  Plan  Continue {CHL HP Upstream Pharmacy Plans:[phone]}   Medication Management   Pt uses *** pharmacy for all medications Uses pill box? {Yes or If no, why not?:20788} Pt endorses ***% compliance  We discussed: ***  Plan  {US  Pharmacy [id number]    Follow up: *** month phone visit  *** The 10-year ASCVD risk score Mikey Bussing DC Jr., et al., 2013) is: 10.5%   Values used to calculate the score:     Age: 56 years     Sex: Female     Is Non-Hispanic African American: No     Diabetic: No     Tobacco smoker: No     Systolic Blood Pressure: 123XX123 mmHg     Is BP treated: Yes     HDL Cholesterol: 43.2 mg/dL     Total Cholesterol: 156 mg/dL  ______________ Visit Information SDOH (Social Determinants of Health) assessments performed: {testsdoh:23840::"Yes."}  Ms. Steffensmeier was given information about Chronic Care Management services today including:  1. CCM service includes personalized support from designated clinical staff supervised by her physician, including individualized plan of care and coordination with other care providers 2. 24/7 contact phone numbers for assistance for urgent and routine care needs. 3. Standard insurance, coinsurance, copays and deductibles apply for chronic care management only during months in which we provide at least 20 minutes of these services. Most insurances cover these services at 100%, however patients may be responsible for any copay, coinsurance and/or deductible if applicable. This service may help you avoid the need for more expensive face-to-face services. 4. Only one practitioner may furnish and bill the service in a calendar month. 5. The patient may stop CCM services at any time (effective at the end of the month) by phone call to the office staff.  Patient agreed to services and verbal consent obtained.   Madelin Rear, Pharm.D. Clinical Pharmacist [facility] [phone]

## 2019-07-04 ENCOUNTER — Encounter: Payer: Self-pay | Admitting: Gastroenterology

## 2019-07-07 ENCOUNTER — Ambulatory Visit: Payer: Medicare HMO

## 2019-07-07 DIAGNOSIS — I10 Essential (primary) hypertension: Secondary | ICD-10-CM

## 2019-07-07 DIAGNOSIS — K219 Gastro-esophageal reflux disease without esophagitis: Secondary | ICD-10-CM

## 2019-07-07 NOTE — Progress Notes (Signed)
Chronic Care Management Pharmacy  Name: AMILIANA FEDORA  MRN: [id number] DOB: 04/30/1945  Chief Complaint/ HPI  [name],  74 y.o. , female presents for their Initial CCM visit with the clinical pharmacist via telephone due to COVID-19 Pandemic.  PCP : [name], MD  Their chronic conditions include: Hypertension, Gastroesophageal Reflux Disease, GERD (erosive esophagitis and gastritis), IBS.  Office Visits:  12/1 (PCP): 5 lbs gained last OV, no diet due to being frustrated with results.  Consult Visits:  4/28 (Dr. Silverio Decamp, Gastroenterology): Persistent symptoms - regurgitation, acid taste, heart burn despite omeprazole twice daily. Switched to Dexilant 60 mg daily and carafate 1 gram twice daily. Constipation - Increase dietary fiber and fluid intake, Benefiber 1 teaspoon twice daily  Outpatient Encounter Medications as of 07/07/2019  Medication Sig  . aspirin EC 81 MG tablet Take 1 tablet (81 mg total) by mouth daily.  Marland Kitchen dexlansoprazole (DEXILANT) 60 MG capsule Take 1 capsule (60 mg total) by mouth daily.  Marland Kitchen gabapentin (NEURONTIN) 300 MG capsule Take 2 capsules (600 mg total) by mouth 2 (two) times daily.  Marland Kitchen latanoprost (XALATAN) 0.005 % ophthalmic solution Place 1 drop into both eyes at bedtime.   Marland Kitchen losartan-hydrochlorothiazide (HYZAAR) 50-12.5 MG tablet Take 1 tablet by mouth daily.  . metoprolol succinate (TOPROL-XL) 100 MG 24 hr tablet TAKE 1 TABLET BY MOUTH DAILY. NO FURTHER REFILLS WITHOUT AN APPT.  . pramipexole (MIRAPEX) 1 MG tablet Take 1 mg by mouth in the morning and at bedtime.  . sucralfate (CARAFATE) 1 g tablet Take 1 tablet (1 g total) by mouth 2 (two) times daily.  Marland Kitchen tiZANidine (ZANAFLEX) 4 MG tablet TAKE 1 TABLET (4 MG TOTAL) BY MOUTH 3 (THREE) TIMES DAILY AS NEEDED FOR MUSCLE SPASMS.  . cyclobenzaprine (FLEXERIL) 10 MG tablet TAKE 1 TABLET BY MOUTH EVERYDAY AT BEDTIME (Patient not taking: Reported on 07/07/2019)  . furosemide (LASIX) 20 MG tablet Take 1  tablet (20 mg total) by mouth daily. (Patient not taking: Reported on 07/07/2019)   No facility-administered encounter medications on file as of 07/07/2019.   Current Diagnosis/Assessment: Goals Addressed            This Visit's Progress   . BP <130/80       CARE PLAN ENTRY Current Barriers:  . Current antihypertensive regimen:  o CONTINUE: metoprolol succinate XL 100 mg daily at bedtime and losartan-hydrochlorothiazide 50-12.5 mg daily at bedtime o STOP: furosemide 20 mg daily and notify office of any swelling/fluid retention . Last practice recorded BP readings:  BP Readings from Last 3 Encounters:  07/02/19 100/60  02/04/19 116/63  10/07/18 121/72   Pharmacist Clinical Goal(s):  Marland Kitchen Over the next 180 days, patient will work with PharmD and providers to optimize antihypertensive regimen.  Interventions: . Inter-disciplinary care team collaboration. . Comprehensive medication review performed; medication list updated in the electronic medical record.  o CONTINUE: metoprolol succinate XL 100 mg daily at bedtime and losartan-hydrochlorothiazide 50-12.5 mg daily at bedtime. o STOP: furosemide 20 mg daily and notify office of any swelling/fluid retention.  Patient Self Care Activities:  . Patient will continue to check BP daily , document, and provide at future appointments. . Patient will focus on medication adherence by utilizing home delivery, medication synchronization, and pill packaging.   Initial goal documentation.    Marland Kitchen GERD: Minimize recurring symptoms       CARE PLAN ENTRY  Current Barriers:  . Chronic Disease Management support, education, and care coordination needs  related to  GERD/IBS. Marland Kitchen Current GERD regimen: o STOP: Omeprazole  o START: Dexilant 60 mg once daily first thing in the morning and sucralfate 1 gram twice daily  Pharmacist Clinical Goal(s):  Marland Kitchen Over next 180 days: Minimize recurring symptoms of GERD.  Interventions: . Comprehensive medication review  performed.  Patient Self Care Activities:  . Please call with any questions! . Patient will focus on medication adherence by utilizing home delivery, medication synchronization, and pill packaging.  Initial goal documentation       Hypertension   BP today is:  <140/90  Office blood pressures are  BP Readings from Last 3 Encounters:  07/02/19 100/60  02/04/19 116/63  10/07/18 121/72   Blood pressure consistently ~120/80 or lower over the last month. Patient is currently controlled on the following medications:   Furosemide 20 mg daily  Metoprolol XL 100 mg daily  Losartan-hctz 50-12.5 mg daily  Denies dizziness, SOB, chest pain. Reviewed recommendation to discontinue furosemide with Dr. Birdie Riddle. Patient agrees with plan and expresses understanding to call office with any signs of fluid retention/swelling after stopping furosemide and daily BP monitoring over next 2 weeks.   Plan Continue metoprolol XL 100 mg and losartan-hctz 50-12.5 mg. STOP furosemide 20 mg daily.     GERD  Currently uncontrolled on omeprazole. Patient reports ongoing symptoms of acid reflux. Had gastro appt last week 4/28, needs to pick up new prescriptions for Dexilant 60 mg daily, Carafate 1 gram twice daily. Omeprazole is being discontinued. Expresses understanding to avoid triggers such as lying down.  Plan  Stop omeprazole. Start Dexilant 60 mg daily, Carafate 1 gram twice daily .   RLS / Pain  Patient is currently controlled on the following medications: gabapentin 600 mg twice daily, tizanidine 4 mg three times daily as needed (has been taking just one at night), and pramipexole 1 mg twice daily. No longer taking cyclobenzaprine. Denies any side effects including drowsiness or day time sleepiness at this time.  Plan Continue current medications.  Vaccines  Reviewed and discussed patient's vaccination history.  Due for Tdap, Shingrix.  Immunization History  Administered Date(s) Administered  .  Fluad Quad(high Dose 65+) 11/10/2018  . Influenza, High Dose Seasonal PF 01/11/2018  . Influenza,inj,Quad PF,6+ Mos 12/23/2014, 10/24/2015, 11/14/2016  . Pneumococcal Conjugate-13 09/09/2013  . Pneumococcal Polysaccharide-23 12/20/2011  . Tdap 07/28/2005   Plan Recommended patient receive Shingrix, Tdap vaccine in office/pharmacy.   Medication Management  Pt uses [facility] for all medications. Uses pill box? Yes.  We discussed benefit of medication home delivery, med synch and pill packaging.   Plan Utilize [facility] for medication synchronization, packaging and delivery.  Follow up: 2 month phone visit. ______________ Visit Information SDOH (Social Determinants of Health) assessments performed:  Yes.   Food Insecurity: No Food Insecurity  . Worried About Charity fundraiser in the Last Year: Never true  . Ran Out of Food in the Last Year: Never true     Transportation Needs: No Transportation Needs  . Lack of Transportation (Medical): No  . Lack of Transportation (Non-Medical): No   Verbal consent obtained for [facility] enhanced pharmacy services (medication synchronization, adherence packaging, delivery coordination). A medication sync plan was created to allow patient to get all medications delivered once every 30 to 90 days per patient preference. Patient understands they have freedom to choose pharmacy and clinical pharmacist will coordinate care between all prescribers and [facility].  Ms. Onnen was given information about Chronic Care  Management services today including:  1. CCM service includes personalized support from designated clinical staff supervised by her physician, including individualized plan of care and coordination with other care providers 2. 24/7 contact phone numbers for assistance for urgent and routine care needs. 3. Standard insurance, coinsurance, copays and deductibles apply for chronic care management only during months in  which we provide at least 20 minutes of these services. Most insurances cover these services at 100%, however patients may be responsible for any copay, coinsurance and/or deductible if applicable. This service may help you avoid the need for more expensive face-to-face services. 4. Only one practitioner may furnish and bill the service in a calendar month. 5. The patient may stop CCM services at any time (effective at the end of the month) by phone call to the office staff.  Patient agreed to services and verbal consent obtained.   Madelin Rear, Pharm.D. Clinical Pharmacist [facility] at [facility] [phone]

## 2019-07-07 NOTE — Patient Instructions (Addendum)
Visit Information  Goals Addressed            This Visit's Progress   . BP <130/80       CARE PLAN ENTRY Current Barriers:  . Current antihypertensive regimen:  o CONTINUE: metoprolol succinate XL 100 mg daily at bedtime and losartan-hydrochlorothiazide 50-12.5 mg daily at bedtime o STOP: furosemide 20 mg daily and notify office of any swelling/fluid retention . Last practice recorded BP readings:  BP Readings from Last 3 Encounters:  07/02/19 100/60  02/04/19 116/63  10/07/18 121/72   Pharmacist Clinical Goal(s):  Marland Kitchen Over the next 180 days, patient will work with PharmD and providers to optimize antihypertensive regimen.  Interventions: . Inter-disciplinary care team collaboration. . Comprehensive medication review performed; medication list updated in the electronic medical record.  o CONTINUE: metoprolol succinate XL 100 mg daily at bedtime and losartan-hydrochlorothiazide 50-12.5 mg daily at bedtime. o STOP: furosemide 20 mg daily and notify office of any swelling/fluid retention.  Patient Self Care Activities:  . Patient will continue to check BP daily , document, and provide at future appointments. . Patient will focus on medication adherence by utilizing home delivery, medication synchronization, and pill packaging.   Initial goal documentation.    Marland Kitchen GERD: Minimize recurring symptoms       CARE PLAN ENTRY  Current Barriers:  . Chronic Disease Management support, education, and care coordination needs related to  GERD/IBS. Marland Kitchen Current GERD regimen: o STOP: Omeprazole  o START: Dexilant 60 mg once daily first thing in the morning and sucralfate 1 gram twice daily  Pharmacist Clinical Goal(s):  Marland Kitchen Over next 180 days: Minimize recurring symptoms of GERD.  Interventions: . Comprehensive medication review performed.  Patient Self Care Activities:  . Please call with any questions! . Patient will focus on medication adherence by utilizing home delivery, medication  synchronization, and pill packaging.  Initial goal documentation       Andrea Byrd was given information about Chronic Care Management services today including:  1. CCM service includes personalized support from designated clinical staff supervised by her physician, including individualized plan of care and coordination with other care providers 2. 24/7 contact phone numbers for assistance for urgent and routine care needs. 3. Standard insurance, coinsurance, copays and deductibles apply for chronic care management only during months in which we provide at least 20 minutes of these services. Most insurances cover these services at 100%, however patients may be responsible for any copay, coinsurance and/or deductible if applicable. This service may help you avoid the need for more expensive face-to-face services. 4. Only one practitioner may furnish and bill the service in a calendar month. 5. The patient may stop CCM services at any time (effective at the end of the month) by phone call to the office staff.  Patient agreed to services and verbal consent obtained.   The patient verbalized understanding of instructions provided today and agreed to receive a mailed copy of patient instruction and/or educational materials. Telephone follow up appointment with pharmacy team member scheduled for: See next appointment with "Care Management Staff" under "What's Next" below.   Thank you!  Madelin Rear, Pharm.D. Clinical Pharmacist [facility] [phone] Hypertension, Adult High blood pressure (hypertension) is when the force of blood pumping through the arteries is too strong. The arteries are the blood vessels that carry blood from the heart throughout the body. Hypertension forces the heart to work harder to pump blood and may cause arteries to become  narrow or stiff. Untreated or uncontrolled hypertension can cause a heart attack, heart failure, a stroke, kidney  disease, and other problems. A blood pressure reading consists of a higher number over a lower number. Ideally, your blood pressure should be below 120/80. The first ("top") number is called the systolic pressure. It is a measure of the pressure in your arteries as your heart beats. The second ("bottom") number is called the diastolic pressure. It is a measure of the pressure in your arteries as the heart relaxes. What are the causes? The exact cause of this condition is not known. There are some conditions that result in or are related to high blood pressure. What increases the risk? Some risk factors for high blood pressure are under your control. The following factors may make you more likely to develop this condition:  Smoking.  Having type 2 diabetes mellitus, high cholesterol, or both.  Not getting enough exercise or physical activity.  Being overweight.  Having too much fat, sugar, calories, or salt (sodium) in your diet.  Drinking too much alcohol. Some risk factors for high blood pressure may be difficult or impossible to change. Some of these factors include:  Having chronic kidney disease.  Having a family history of high blood pressure.  Age. Risk increases with age.  Race. You may be at higher risk if you are African American.  Gender. Men are at higher risk than women before age 1. After age 37, women are at higher risk than men.  Having obstructive sleep apnea.  Stress. What are the signs or symptoms? High blood pressure may not cause symptoms. Very high blood pressure (hypertensive crisis) may cause:  Headache.  Anxiety.  Shortness of breath.  Nosebleed.  Nausea and vomiting.  Vision changes.  Severe chest pain.  Seizures. How is this diagnosed? This condition is diagnosed by measuring your blood pressure while you are seated, with your arm resting on a flat surface, your legs uncrossed, and your feet flat on the floor. The cuff of the blood  pressure monitor will be placed directly against the skin of your upper arm at the level of your heart. It should be measured at least twice using the same arm. Certain conditions can cause a difference in blood pressure between your right and left arms. Certain factors can cause blood pressure readings to be lower or higher than normal for a short period of time:  When your blood pressure is higher when you are in a health care provider's office than when you are at home, this is called white coat hypertension. Most people with this condition do not need medicines.  When your blood pressure is higher at home than when you are in a health care provider's office, this is called masked hypertension. Most people with this condition may need medicines to control blood pressure. If you have a high blood pressure reading during one visit or you have normal blood pressure with other risk factors, you may be asked to:  Return on a different day to have your blood pressure checked again.  Monitor your blood pressure at home for 1 week or longer. If you are diagnosed with hypertension, you may have other blood or imaging tests to help your health care provider understand your overall risk for other conditions. How is this treated? This condition is treated by making healthy lifestyle changes, such as eating healthy foods, exercising more, and reducing your alcohol intake. Your health care provider may prescribe medicine if lifestyle  changes are not enough to get your blood pressure under control, and if:  Your systolic blood pressure is above 130.  Your diastolic blood pressure is above 80. Your personal target blood pressure may vary depending on your medical conditions, your age, and other factors. Follow these instructions at home: Eating and drinking   Eat a diet that is high in fiber and potassium, and low in sodium, added sugar, and fat. An example eating plan is called the DASH (Dietary  Approaches to Stop Hypertension) diet. To eat this way: ? Eat plenty of fresh fruits and vegetables. Try to fill one half of your plate at each meal with fruits and vegetables. ? Eat whole grains, such as whole-wheat pasta, brown rice, or whole-grain bread. Fill about one fourth of your plate with whole grains. ? Eat or drink low-fat dairy products, such as skim milk or low-fat yogurt. ? Avoid fatty cuts of meat, processed or cured meats, and poultry with skin. Fill about one fourth of your plate with lean proteins, such as fish, chicken without skin, beans, eggs, or tofu. ? Avoid pre-made and processed foods. These tend to be higher in sodium, added sugar, and fat.  Reduce your daily sodium intake. Most people with hypertension should eat less than 1,500 mg of sodium a day.  Do not drink alcohol if: ? Your health care provider tells you not to drink. ? You are pregnant, may be pregnant, or are planning to become pregnant.  If you drink alcohol: ? Limit how much you use to:  0-1 drink a day for women.  0-2 drinks a day for men. ? Be aware of how much alcohol is in your drink. In the [place] one drink equals one 12 oz bottle of beer (355 mL), one 5 oz glass of wine (148 mL), or one 1 oz glass of hard liquor (44 mL). Lifestyle   Work with your health care provider to maintain a healthy body weight or to lose weight. Ask what an ideal weight is for you.  Get at least 30 minutes of exercise most days of the week. Activities may include walking, swimming, or biking.  Include exercise to strengthen your muscles (resistance exercise), such as Pilates or lifting weights, as part of your weekly exercise routine. Try to do these types of exercises for 30 minutes at least 3 days a week.  Do not use any products that contain nicotine or tobacco, such as cigarettes, e-cigarettes, and chewing tobacco. If you need help quitting, ask your health care provider.  Monitor your blood pressure at home as  told by your health care provider.  Keep all follow-up visits as told by your health care provider. This is important. Medicines  Take over-the-counter and prescription medicines only as told by your health care provider. Follow directions carefully. Blood pressure medicines must be taken as prescribed.  Do not skip doses of blood pressure medicine. Doing this puts you at risk for problems and can make the medicine less effective.  Ask your health care provider about side effects or reactions to medicines that you should watch for. Contact a health care provider if you:  Think you are having a reaction to a medicine you are taking.  Have headaches that keep coming back (recurring).  Feel dizzy.  Have swelling in your ankles.  Have trouble with your vision. Get help right away if you:  Develop a severe headache or confusion.  Have unusual weakness or numbness.  Feel faint.  Have  severe pain in your chest or abdomen.  Vomit repeatedly.  Have trouble breathing. Summary  Hypertension is when the force of blood pumping through your arteries is too strong. If this condition is not controlled, it may put you at risk for serious complications.  Your personal target blood pressure may vary depending on your medical conditions, your age, and other factors. For most people, a normal blood pressure is less than 120/80.  Hypertension is treated with lifestyle changes, medicines, or a combination of both. Lifestyle changes include losing weight, eating a healthy, low-sodium diet, exercising more, and limiting alcohol. This information is not intended to replace advice given to you by your health care provider. Make sure you discuss any questions you have with your health care provider. Document Revised: 10/31/2017 Document Reviewed: 10/31/2017 Elsevier Patient Education  2020 [employer].

## 2019-07-08 DIAGNOSIS — M545 Low back pain: Secondary | ICD-10-CM | POA: Diagnosis not present

## 2019-07-09 ENCOUNTER — Other Ambulatory Visit: Payer: Self-pay | Admitting: General Practice

## 2019-07-09 DIAGNOSIS — G2581 Restless legs syndrome: Secondary | ICD-10-CM

## 2019-07-09 MED ORDER — GABAPENTIN 300 MG PO CAPS: 1.0000 mg | ORAL_CAPSULE | Freq: Two times a day (BID) | ORAL | 1 refills | Status: DC

## 2019-07-09 MED ORDER — PRAMIPEXOLE DIHYDROCHLORIDE 1 MG PO TABS: 1.0000 mg | ORAL_TABLET | Freq: Two times a day (BID) | ORAL | 1 refills | Status: DC

## 2019-07-09 MED ORDER — METOPROLOL SUCCINATE ER 100 MG PO TB24: ORAL_TABLET | ORAL | 1 refills | Status: DC

## 2019-07-09 MED ORDER — LOSARTAN POTASSIUM-HCTZ 50-12.5 MG PO TABS: 1.0000 | ORAL_TABLET | Freq: Every day | ORAL | 1 refills | Status: DC

## 2019-07-15 ENCOUNTER — Telehealth: Payer: Self-pay | Admitting: Gastroenterology

## 2019-07-15 NOTE — Telephone Encounter (Signed)
Patient is calling- regarding Dexilant. She went to pick it up but it is $300 out of pocket. She states that help at hand sent her a letter to help her get it but they will not accept the stamp of Dr. Jillyn Hidden name they need her actual signature. She is asking if she comes by the office today if Dr. Silverio Decamp can sign the paper for her really quick.

## 2019-07-15 NOTE — Telephone Encounter (Signed)
The form would need to be dropped off. I can't guarantee that Dr.Nandigam will be available to sign when pt comes by office. I can call and let pt know when form is ready to pick up.

## 2019-07-15 NOTE — Telephone Encounter (Signed)
Left message for patient to let her know she could drop the form off.

## 2019-07-28 DIAGNOSIS — M545 Low back pain: Secondary | ICD-10-CM | POA: Diagnosis not present

## 2019-07-28 DIAGNOSIS — M47816 Spondylosis without myelopathy or radiculopathy, lumbar region: Secondary | ICD-10-CM | POA: Diagnosis not present

## 2019-07-30 ENCOUNTER — Other Ambulatory Visit: Payer: Self-pay | Admitting: Family Medicine

## 2019-07-30 DIAGNOSIS — Z1231 Encounter for screening mammogram for malignant neoplasm of breast: Secondary | ICD-10-CM

## 2019-08-04 ENCOUNTER — Other Ambulatory Visit: Payer: Self-pay | Admitting: Family Medicine

## 2019-08-05 ENCOUNTER — Other Ambulatory Visit: Payer: Self-pay | Admitting: General Practice

## 2019-08-05 DIAGNOSIS — G2581 Restless legs syndrome: Secondary | ICD-10-CM

## 2019-08-07 DIAGNOSIS — M47816 Spondylosis without myelopathy or radiculopathy, lumbar region: Secondary | ICD-10-CM | POA: Diagnosis not present

## 2019-08-07 DIAGNOSIS — M545 Low back pain: Secondary | ICD-10-CM | POA: Diagnosis not present

## 2019-08-14 ENCOUNTER — Other Ambulatory Visit: Payer: Self-pay

## 2019-08-14 ENCOUNTER — Encounter: Payer: Self-pay | Admitting: Family Medicine

## 2019-08-14 ENCOUNTER — Ambulatory Visit (INDEPENDENT_AMBULATORY_CARE_PROVIDER_SITE_OTHER): Payer: Medicare HMO | Admitting: Family Medicine

## 2019-08-14 VITALS — BP 101/78 | HR 76 | Temp 98.0°F | Resp 16 | Ht 63.0 in | Wt 192.5 lb

## 2019-08-14 DIAGNOSIS — I10 Essential (primary) hypertension: Secondary | ICD-10-CM

## 2019-08-14 DIAGNOSIS — E669 Obesity, unspecified: Secondary | ICD-10-CM

## 2019-08-14 DIAGNOSIS — F4323 Adjustment disorder with mixed anxiety and depressed mood: Secondary | ICD-10-CM

## 2019-08-14 DIAGNOSIS — R69 Illness, unspecified: Secondary | ICD-10-CM | POA: Diagnosis not present

## 2019-08-14 LAB — CBC WITH DIFFERENTIAL/PLATELET
Basophils Absolute: 0.1 10*3/uL (ref 0.0–0.1)
Basophils Relative: 0.6 % (ref 0.0–3.0)
Eosinophils Absolute: 0.1 10*3/uL (ref 0.0–0.7)
Eosinophils Relative: 0.6 % (ref 0.0–5.0)
HCT: 36.5 % (ref 36.0–46.0)
Hemoglobin: 12.1 g/dL (ref 12.0–15.0)
Lymphocytes Relative: 15.8 % (ref 12.0–46.0)
Lymphs Abs: 1.7 10*3/uL (ref 0.7–4.0)
MCHC: 33.1 g/dL (ref 30.0–36.0)
MCV: 86.8 fl (ref 78.0–100.0)
Monocytes Absolute: 0.5 10*3/uL (ref 0.1–1.0)
Monocytes Relative: 5 % (ref 3.0–12.0)
Neutro Abs: 8.4 10*3/uL — ABNORMAL HIGH (ref 1.4–7.7)
Neutrophils Relative %: 78 % — ABNORMAL HIGH (ref 43.0–77.0)
Platelets: 276 10*3/uL (ref 150.0–400.0)
RBC: 4.21 Mil/uL (ref 3.87–5.11)
RDW: 16.1 % — ABNORMAL HIGH (ref 11.5–15.5)
WBC: 10.7 10*3/uL — ABNORMAL HIGH (ref 4.0–10.5)

## 2019-08-14 LAB — BASIC METABOLIC PANEL
BUN: 14 mg/dL (ref 6–23)
CO2: 32 mEq/L (ref 19–32)
Calcium: 9.2 mg/dL (ref 8.4–10.5)
Chloride: 101 mEq/L (ref 96–112)
Creatinine, Ser: 0.86 mg/dL (ref 0.40–1.20)
GFR: 64.62 mL/min (ref >60.00)
Glucose, Bld: 162 mg/dL — ABNORMAL HIGH (ref 70–99)
Potassium: 3.8 mEq/L (ref 3.5–5.1)
Sodium: 139 mEq/L (ref 135–145)

## 2019-08-14 LAB — LIPID PANEL
Cholesterol: 158 mg/dL (ref 0–200)
HDL: 40.1 mg/dL (ref >39.00)
LDL Cholesterol: 87 mg/dL (ref 0–99)
NonHDL: 118.38
Total CHOL/HDL Ratio: 4
Triglycerides: 156 mg/dL — ABNORMAL HIGH (ref 0.0–149.0)
VLDL: 31.2 mg/dL (ref 0.0–40.0)

## 2019-08-14 LAB — TSH: TSH: 1.28 u[IU]/mL (ref 0.35–4.50)

## 2019-08-14 LAB — HEPATIC FUNCTION PANEL
ALT: 18 U/L (ref 0–35)
AST: 16 U/L (ref 0–37)
Albumin: 3.8 g/dL (ref 3.5–5.2)
Alkaline Phosphatase: 103 U/L (ref 39–117)
Bilirubin, Direct: 0.1 mg/dL (ref 0.0–0.3)
Total Bilirubin: 0.3 mg/dL (ref 0.2–1.2)
Total Protein: 6 g/dL (ref 6.0–8.3)

## 2019-08-14 NOTE — Patient Instructions (Signed)
Schedule your complete physical in 3 months We'll notify you of your lab results and make any changes if needed Try and take some time for yourself!  Even if it's just a walk! Call with any questions or concerns- particularly if we can help in any way Hang in there!!!

## 2019-08-14 NOTE — Progress Notes (Signed)
   Subjective:    Patient ID: Andrea Byrd, female    DOB: 1945-09-20, 74 y.o.   MRN: [id number]  HPI HTN- chronic problem, on Losartan HCTZ 50/12.5mg  daily and Metoprolol 100mg  daily w/ good control.  No CP.  Mild SOB.  No HAs or blurry vision.  No edema.  Obesity- BMI 34.1.  Weight is currently stable.  She is spending mornings at the nursing home and afternoons at the hospital.  No time for exercise.    Adjustment disorder- husband keeps having repetitive strokes.  Has currently been hospitalized x19 days.  And 40 days in the last 2 months.  She's been told that 'he's going to keep having them til he's gone'.  Plan is for him to come home Saturday.  Pt is not interested in medication for mood.  Husband is not enrolled in Hospice.  Sister was just enrolled in Hospice yesterday.   Review of Systems For ROS see HPI   This visit occurred during the SARS-CoV-2 public health emergency.  Safety protocols were in place, including screening questions prior to the visit, additional usage of staff PPE, and extensive cleaning of exam room while observing appropriate contact time as indicated for disinfecting solutions.       Objective:   Physical Exam Vitals reviewed.  Constitutional:      General: She is not in acute distress.    Appearance: She is well-developed. She is obese.  HENT:     Head: Normocephalic and atraumatic.  Eyes:     Conjunctiva/sclera: Conjunctivae normal.     Pupils: Pupils are equal, round, and reactive to light.  Neck:     Thyroid: No thyromegaly.  Cardiovascular:     Rate and Rhythm: Normal rate and regular rhythm.     Heart sounds: Normal heart sounds. No murmur heard.   Pulmonary:     Effort: Pulmonary effort is normal. No respiratory distress.     Breath sounds: Normal breath sounds.  Abdominal:     General: There is no distension.     Palpations: Abdomen is soft.     Tenderness: There is no abdominal tenderness.  Musculoskeletal:     Cervical back: Normal  range of motion and neck supple.  Lymphadenopathy:     Cervical: No cervical adenopathy.  Skin:    General: Skin is warm and dry.  Neurological:     Mental Status: She is alert and oriented to person, place, and time.  Psychiatric:        Behavior: Behavior normal.           Assessment & Plan:  Adjustment disorder- new.  Pt's husband has had multiple strokes recently ('at least 5') and has spent a good bit of time in the hospital.  She has been told there is nothing else they can do and he will continue to have them.  This places a huge amount of stress on her as the caregiver.  Her sister is also in Hospice and she is visiting her daily.  Discussed Hospice counseling, medication- pt is not willing at this time.  Will continue to follow and offer my support.

## 2019-08-14 NOTE — Assessment & Plan Note (Signed)
Chronic problem.  Well controlled.  Asymptomatic.  Check labs.  No anticipated med changes.  Will follow. 

## 2019-08-14 NOTE — Assessment & Plan Note (Signed)
Ongoing issue for pt.  Given the amount of stress she is under I encouraged her to be kind to herself as she is very upset about how she looks.  Encouraged her to take time for herself and get a walk in, but not b/c of how she looks.  Will continue to follow.

## 2019-08-15 NOTE — Telephone Encounter (Signed)
Patient aware that patient assistance paper work has been signed and put into the mail to be sent to [facility].

## 2019-08-19 ENCOUNTER — Encounter: Payer: Self-pay | Admitting: General Practice

## 2019-08-23 ENCOUNTER — Other Ambulatory Visit: Payer: Self-pay | Admitting: Family Medicine

## 2019-08-25 ENCOUNTER — Encounter: Payer: Self-pay | Admitting: Gastroenterology

## 2019-08-25 ENCOUNTER — Ambulatory Visit: Payer: Medicare HMO | Admitting: Gastroenterology

## 2019-08-25 VITALS — BP 100/60 | HR 68 | Ht 61.75 in | Wt 188.5 lb

## 2019-08-25 DIAGNOSIS — K449 Diaphragmatic hernia without obstruction or gangrene: Secondary | ICD-10-CM

## 2019-08-25 DIAGNOSIS — K219 Gastro-esophageal reflux disease without esophagitis: Secondary | ICD-10-CM

## 2019-08-25 DIAGNOSIS — R1031 Right lower quadrant pain: Secondary | ICD-10-CM

## 2019-08-25 NOTE — Progress Notes (Signed)
Andrea Byrd    [id number]    18-Jan-1946  Primary Care Physician:Tabori, Aundra Millet, MD  Referring Physician: [name], MD [address] [place],  [place] [address]   Chief complaint: GERD, right lower quadrant abdominal pain  HPI:  74 year old pleasant female here for follow-up visit for GERD and right lower quadrant abdominal pain  She did not start taking Dexilant, has higher out-of-pocket expense.  Listed under allergies previously but when questioned, patient does not recall , may be nausea and was not an allergic reaction.  Continues to have persistent right lower quadrant abdominal pain, no relationship to diet or bowel habits but sometimes worse with activity.  No relieving factors  CT abdomen and pelvis without contrast 10/03/2017: Moderate hiatal hernia, sigmoid diverticulosis and multilevel degenerative changes in lumbar spine, scattered mesenteric lymph nodes  EGD August 21, 2016: LA grade C esophagitis, esophageal stricture dilated with 20 mm TTS balloon.  Gastritis  Colonoscopy February 09, 2012: Diverticulosis, sigmoid anastomosis.  Unable to retroflex in rectum due to small rectal vault.  Outpatient Encounter Medications as of 08/25/2019  Medication Sig  . aspirin EC 81 MG tablet Take 1 tablet (81 mg total) by mouth daily.  Marland Kitchen gabapentin (NEURONTIN) 300 MG capsule Take 1 capsule (300 mg total) by mouth 2 (two) times daily.  Marland Kitchen latanoprost (XALATAN) 0.005 % ophthalmic solution Place 1 drop into both eyes at bedtime.   Marland Kitchen losartan-hydrochlorothiazide (HYZAAR) 50-12.5 MG tablet Take 1 tablet by mouth daily.  . metoprolol succinate (TOPROL-XL) 100 MG 24 hr tablet TAKE 1 TABLET BY MOUTH DAILY. NO FURTHER REFILLS WITHOUT AN APPT.  . pramipexole (MIRAPEX) 1 MG tablet TAKE 1 TABLET BY MOUTH 2 TIMES DAILY  . tiZANidine (ZANAFLEX) 4 MG tablet TAKE 1 TABLET (4 MG TOTAL) BY MOUTH 3 (THREE) TIMES DAILY AS NEEDED FOR MUSCLE SPASMS.   No  facility-administered encounter medications on file as of 08/25/2019.    Allergies as of 08/25/2019 - Review Complete 08/25/2019  Allergen Reaction Noted  . Nitrofurantoin  02/04/2009  . Propoxyphene n-acetaminophen  02/04/2009  . Dexilant [dexlansoprazole] Nausea And Vomiting 08/29/2016  . Nexium [esomeprazole magnesium] Nausea And Vomiting 08/29/2016  . Nortriptyline hcl Nausea And Vomiting 08/29/2016  . Hydrocodone-homatropine Itching and Rash 09/01/2015  . Morphine Itching and Rash     Past Medical History:  Diagnosis Date  . Anemia   . Arthritis   . Cervical cancer ([facility]) 1977  . Diverticulosis   . Endometriosis   . Esophageal stricture   . Gastritis   . GERD (gastroesophageal reflux disease)   . Glaucoma   . Herpes simplex type 1 infection   . Hiatal hernia   . History of colon polyps   . History of gallstones   . Hypertension   . IBS (irritable bowel syndrome)   . Insomnia   . Post-operative nausea and vomiting   . Restless leg syndrome     Past Surgical History:  Procedure Laterality Date  . ABDOMINAL HYSTERECTOMY  1998  . APPENDECTOMY  1998  . CERVICAL CONE BIOPSY    . CHOLECYSTECTOMY  1980  . COLON RESECTION  2002  . COLON SURGERY  2003   removed 2 feet colon  . ESOPHAGEAL MANOMETRY N/A 04/24/2016   Procedure: ESOPHAGEAL MANOMETRY (EM);  Surgeon: Mauri Pole, MD;  Location: [facility];  Service: Endoscopy;  Laterality: N/A;  . HAND SURGERY     right/ thumb  joints replaced both hands  . OOPHORECTOMY    . TUBAL LIGATION      Family History  Problem Relation Age of Onset  . Diabetes Mother   . Stroke Mother        brother, sister, MGM  . Colon polyps Mother   . Heart disease Father   . Kidney disease Father   . Colon polyps Father   . Lung disease Father   . Kidney cancer Sister   . Stroke Sister   . Diabetes Sister   . Colon polyps Sister   . Celiac disease Other        niece  . Heart attack Paternal Grandfather   . Colon cancer Neg  Hx   . Esophageal cancer Neg Hx   . Rectal cancer Neg Hx   . Stomach cancer Neg Hx     Social History   Socioeconomic History  . Marital status: Married    Spouse name: Not on file  . Number of children: 1  . Years of education: Not on file  . Highest education level: Not on file  Occupational History  . Occupation: Retired  Tobacco Use  . Smoking status: Never Smoker  . Smokeless tobacco: Never Used  Vaping Use  . Vaping Use: Never used  Substance and Sexual Activity  . Alcohol use: No  . Drug use: No  . Sexual activity: Not on file  Other Topics Concern  . Not on file  Social History Narrative   Lives with husband floyd   Drinks maybe 4 sodas a week   Social Determinants of Radio broadcast assistant Strain:   . Difficulty of Paying Living Expenses:   Food Insecurity: No Food Insecurity  . Worried About Charity fundraiser in the Last Year: Never true  . Ran Out of Food in the Last Year: Never true  Transportation Needs: No Transportation Needs  . Lack of Transportation (Medical): No  . Lack of Transportation (Non-Medical): No  Physical Activity:   . Days of Exercise per Week:   . Minutes of Exercise per Session:   Stress:   . Feeling of Stress :   Social Connections:   . Frequency of Communication with Friends and Family:   . Frequency of Social Gatherings with Friends and Family:   . Attends Religious Services:   . Active Member of Clubs or Organizations:   . Attends Archivist Meetings:   Marland Kitchen Marital Status:   Intimate Partner Violence:   . Fear of Current or Ex-Partner:   . Emotionally Abused:   Marland Kitchen Physically Abused:   . Sexually Abused:       Review of systems: All other review of systems negative except as mentioned in the HPI.   Physical Exam: Vitals:   08/25/19 0923  BP: 100/60  Pulse: 68   Body mass index is 34.76 kg/m. Gen:      No acute distress Abd:       soft, right lower quadrant tender; no palpable masses, no  distension Ext:    No edema Skin:      Warm and dry Neuro: alert and oriented x 3 Psych: normal mood and affect  Data Reviewed:  Reviewed labs, radiology imaging, old records and pertinent past GI work up   Assessment and Plan/Recommendations:  74 year old pleasant female with history of hiatal hernia, chronic GERD, erosive esophagitis in 2018 with complaints of persistent GERD symptoms despite high-dose PPI  Schedule for EGD for evaluation,  exclude recurrent erosive esophagitis, ulcers secondary to uncontrolled reflux  Continue Carafate 1 g twice daily before meals Patient was provided samples for Dexilant  Right lower quadrant abdominal pain: Persistent, mild tenderness on exam.  Unclear etiology.  Will schedule CT abdomen pelvis with contrast for further evaluation  The risks and benefits as well as alternatives of endoscopic procedure(s) have been discussed and reviewed. All questions answered. The patient agrees to proceed.   The patient was provided an opportunity to ask questions and all were answered. The patient agreed with the plan and demonstrated an understanding of the instructions.  Damaris Hippo , MD    CC: [name], MD

## 2019-08-25 NOTE — Patient Instructions (Signed)
If you are age 73 or older, your body mass index should be between 23-30. Your Body mass index is 34.76 kg/m. If this is out of the aforementioned range listed, please consider follow up with your Primary Care Provider.  If you are age 34 or younger, your body mass index should be between 19-25. Your Body mass index is 34.76 kg/m. If this is out of the aformentioned range listed, please consider follow up with your Primary Care Provider.   We have given you samples of the following medication to take: Dexilant 60 mg  You have been scheduled for a CT scan of the abdomen and pelvis at [facility], 1st floor Radiology. You are scheduled on 09/01/2019  at 2:30pm. You should arrive 15 minutes prior to your appointment time for registration. Please follow the written instructions below on the day of your exam:    1) Do not eat anything after 10:30am (4 hours prior to your test)  2) At least 3 days prior to your procedure you will need to pick up) 2 bottles of oral contrast to drink.  The solution may taste better if refrigerated, but do NOT add ice or any other liquid to this solution. Shake well before drinking.   Drink 1 bottle of contrast @ 12:30pm (2 hours prior to your exam)  Drink 1 bottle of contrast @ 11:30 pm (1 hour prior to your exam)   You may take any medications as prescribed with a small amount of water, if necessary. If you take any of the following medications: METFORMIN, GLUCOPHAGE, GLUCOVANCE, AVANDAMET, RIOMET, FORTAMET, [place] MET, JANUMET, GLUMETZA or METAGLIP, you MAY be asked to HOLD this medication 48 hours AFTER the exam.   The purpose of you drinking the oral contrast is to aid in the visualization of your intestinal tract. The contrast solution may cause some diarrhea. Depending on your individual set of symptoms, you may also receive an intravenous injection of x-ray contrast/dye. Plan on being at [facility] for 45 minutes or longer, depending on the type of exam you are  having performed.   If you have any questions regarding your exam or if you need to reschedule, you may call [facility] at [phone] between the hours of 8:00 am and 5:00 pm, Monday-Friday.   Due to recent changes in healthcare laws, you may see the results of your imaging and laboratory studies on MyChart before your provider has had a chance to review them.  We understand that in some cases there may be results that are confusing or concerning to you. Not all laboratory results come back in the same time frame and the provider may be waiting for multiple results in order to interpret others.  Please give [place] 48 hours in order for your provider to thoroughly review all the results before contacting the office for clarification of your results.

## 2019-09-01 ENCOUNTER — Encounter: Payer: Self-pay | Admitting: Gastroenterology

## 2019-09-01 ENCOUNTER — Ambulatory Visit (HOSPITAL_COMMUNITY)
Admission: RE | Admit: 2019-09-01 | Discharge: 2019-09-01 | Disposition: A | Discharge status: Home / Self Care | Payer: Medicare HMO | Source: Ambulatory Visit | Attending: Gastroenterology | Admitting: Gastroenterology

## 2019-09-01 ENCOUNTER — Other Ambulatory Visit: Payer: Self-pay

## 2019-09-01 DIAGNOSIS — K449 Diaphragmatic hernia without obstruction or gangrene: Secondary | ICD-10-CM | POA: Diagnosis present

## 2019-09-01 DIAGNOSIS — K219 Gastro-esophageal reflux disease without esophagitis: Secondary | ICD-10-CM | POA: Diagnosis present

## 2019-09-01 DIAGNOSIS — R1031 Right lower quadrant pain: Secondary | ICD-10-CM | POA: Insufficient documentation

## 2019-09-01 MED ORDER — SODIUM CHLORIDE (PF) 0.9 % IJ SOLN
INTRAMUSCULAR | Status: AC
  Filled 2019-09-01: qty 50

## 2019-09-01 MED ORDER — IOHEXOL 300 MG/ML  SOLN
100.0000 mL | Freq: Once | INTRAMUSCULAR | Status: AC | PRN
  Administered 2019-09-01: 100 mL via INTRAVENOUS

## 2019-09-03 DIAGNOSIS — M545 Low back pain: Secondary | ICD-10-CM | POA: Diagnosis not present

## 2019-09-03 DIAGNOSIS — M47816 Spondylosis without myelopathy or radiculopathy, lumbar region: Secondary | ICD-10-CM | POA: Diagnosis not present

## 2019-09-09 ENCOUNTER — Other Ambulatory Visit: Payer: Self-pay

## 2019-09-09 DIAGNOSIS — G2581 Restless legs syndrome: Secondary | ICD-10-CM

## 2019-09-09 NOTE — Telephone Encounter (Signed)
Gabapentin LFD 07/09/19 #360 with no refills LOV 08/14/19 NOV 11/14/19

## 2019-09-10 MED ORDER — GABAPENTIN 300 MG PO CAPS: 600.0000 mg | ORAL_CAPSULE | Freq: Two times a day (BID) | ORAL | 1 refills | Status: DC

## 2019-09-10 NOTE — Telephone Encounter (Signed)
Patient states she is taking 2 tabs in the am and 2 tabs at night

## 2019-09-10 NOTE — Telephone Encounter (Signed)
Please call patient to clarify how she is taking. The instructions say 1 tablet twice daily but quantity was 360 which would be a 3 months supply if taking 4 tablets total per day.

## 2019-09-10 NOTE — Telephone Encounter (Signed)
RX updated in patient chart and new rx sent

## 2019-09-10 NOTE — Telephone Encounter (Signed)
Please update chart to reflect this. Per last fill (5/8) she should not be due for medication refill for another month.

## 2019-09-11 DIAGNOSIS — M545 Low back pain: Secondary | ICD-10-CM | POA: Diagnosis not present

## 2019-09-11 DIAGNOSIS — M47816 Spondylosis without myelopathy or radiculopathy, lumbar region: Secondary | ICD-10-CM | POA: Diagnosis not present

## 2019-09-12 ENCOUNTER — Other Ambulatory Visit: Payer: Self-pay | Admitting: Family Medicine

## 2019-09-12 NOTE — Telephone Encounter (Signed)
Tizanidine LFD 08/25/19 #45 with 1 refill LOV 08/14/19 NOV 11/14/19

## 2019-09-16 ENCOUNTER — Ambulatory Visit: Payer: Medicare HMO

## 2019-09-16 DIAGNOSIS — I10 Essential (primary) hypertension: Secondary | ICD-10-CM

## 2019-09-16 DIAGNOSIS — K219 Gastro-esophageal reflux disease without esophagitis: Secondary | ICD-10-CM

## 2019-09-16 NOTE — Progress Notes (Signed)
[facility] Management Pharmacy  Name: MAYUKHA SYMMONDS  MRN: [id number] DOB: 10/05/1945  Chief Complaint/ HPI Andrea Byrd,  74 y.o. , female presents for their Initial CCM visit with the clinical pharmacist via telephone due to COVID-19 Pandemic.  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 Patient reports starting routine of walking once daily every morning with her dogs. She states this has helped to give her some time to herself. Declines any additional support at this time and understands to call with any questions or concerns.   Primary concern today is cost of Dexilant, several previous PPIs tried.Has some ongoing reflux but reports finding the most relief with Dexilant (compared to other PPIs).   PCP : [name], MD  Their chronic conditions include: GERD, HTN, Obesity, RLS.   Office Visits: 08/25/2019 (Dr. Silverio Decamp, GI): Dexilant samples.  08/14/2019 (PCP): walking encouraged 07/02/2019 (Dr. Silverio Decamp, Gastroenterology): Persistent symptoms - regurgitation, acid taste, heart burn despite omeprazole twice daily. Switched to Dexilant 60 mg daily and carafate 1 gram twice daily. Constipation - Increase dietary fiber and fluid intake, Benefiber 1 teaspoon twice daily  Medications: Outpatient Encounter Medications as of 09/16/2019  Medication Sig  . aspirin EC 81 MG tablet Take 1 tablet (81 mg total) by mouth daily.  Marland Kitchen gabapentin (NEURONTIN) 300 MG capsule Take 2 capsules (600 mg total) by mouth 2 (two) times daily.  Marland Kitchen latanoprost (XALATAN) 0.005 % ophthalmic solution Place 1 drop into both eyes at bedtime.   Marland Kitchen losartan-hydrochlorothiazide (HYZAAR) 50-12.5 MG tablet Take 1 tablet by mouth daily.  . metoprolol succinate (TOPROL-XL) 100 MG 24 hr tablet TAKE 1 TABLET BY MOUTH DAILY. NO FURTHER REFILLS WITHOUT AN APPT.  . pramipexole (MIRAPEX) 1 MG tablet TAKE 1 TABLET BY MOUTH 2 TIMES DAILY  . tiZANidine (ZANAFLEX) 4 MG tablet TAKE 1 TABLET (4 MG TOTAL)  BY MOUTH 3 (THREE) TIMES DAILY AS NEEDED FOR MUSCLE SPASMS.   No facility-administered encounter medications on file as of 09/16/2019.   Hypertension   BP Readings from Last 3 Encounters:  08/25/19 100/60  08/14/19 101/78  07/02/19 100/60   BP continues to be well controlled. Furosemide has been discontinued. No fluid retention. Denies dizziness, SOB, chest pain. Patient is currently controlled on the following medications:   Losartan-hctz 50-12.5 mg daily  Metoprolol succinate 100 mg daily  Plan  Continue current medications.   GERD   Reports ongoing acid reflux. Reports that she has benefited the most from [place]. Concerned with cost, inaccessible due to insurance no covering. Patient is currently taking:  Dexilant 60 mg daily (samples)  We discussed: PAP for Dexilant.   Plan   Continue current medication. Coordinate PAP for Dexilant.   Vaccines   Immunization History  Administered Date(s) Administered  . Fluad Quad(high Dose 65+) 11/10/2018  . Influenza, High Dose Seasonal PF 01/11/2018  . Influenza,inj,Quad PF,6+ Mos 12/23/2014, 10/24/2015, 11/14/2016  . Pneumococcal Conjugate-13 09/09/2013  . Pneumococcal Polysaccharide-23 12/20/2011  . Tdap 07/28/2005   Due for Shingrix, tdap  Plan  Recommended patient receive shingrix/tdap vaccines in pharmacy  Medication Management   Receives prescription medications from:  [id number] - [place], [place] - [address] [place] [address] Phone: [phone] Fax: [phone]   [facility]. [facility].   Plan  Continue current medication management strategy.  Follow up: 6 month phone visit. PAP support. Application  prefilled and sent to patient. _____________________________________ Madelin Rear, Pharm.D. Clinical Pharmacist [facility] [phone]

## 2019-09-16 NOTE — Patient Instructions (Addendum)
  Please fill out the highlighted sections on the [place] patient assistance application and include proof of income as outlined in the application. You can either drop this off at Dr. Woodward Ku office for signature or Dr. Virgil Benedict office (where I am located) and I will send to Dr. Silverio Decamp. Please call me at [phone] (direct line) with any questions - thank you!  - Edyth Gunnels., Clinical Pharmacist  The patient verbalized understanding of instructions provided today and agreed to receive a mailed copy of patient instruction and/or educational materials. Telephone follow up appointment with pharmacy team member scheduled for: 6 month telephone visit Thank you!  Madelin Rear, Pharm.D., BCGP Clinical Pharmacist [facility] [phone]

## 2019-10-08 ENCOUNTER — Encounter: Payer: Self-pay | Admitting: Family Medicine

## 2019-10-08 ENCOUNTER — Ambulatory Visit: Payer: Medicare HMO | Admitting: Family Medicine

## 2019-10-08 ENCOUNTER — Other Ambulatory Visit: Payer: Self-pay

## 2019-10-08 VITALS — BP 103/60 | HR 70 | Ht 61.0 in | Wt 185.0 lb

## 2019-10-08 DIAGNOSIS — G2581 Restless legs syndrome: Secondary | ICD-10-CM

## 2019-10-08 DIAGNOSIS — R208 Other disturbances of skin sensation: Secondary | ICD-10-CM

## 2019-10-08 NOTE — Patient Instructions (Addendum)
We will continue gabapentin and pramipexole for now. Please let me know if symptoms worsen.   Follow up yearly or return to PCP for refills if you wish    Neuropathic Pain Neuropathic pain is pain caused by damage to the nerves that are responsible for certain sensations in your body (sensory nerves). The pain can be caused by:  Damage to the sensory nerves that send signals to your spinal cord and brain (peripheral nervous system).  Damage to the sensory nerves in your brain or spinal cord (central nervous system). Neuropathic pain can make you more sensitive to pain. Even a minor sensation can feel very painful. This is usually a long-term condition that can be difficult to treat. The type of pain differs from person to person. It may:  Start suddenly (acute), or it may develop slowly and last for a long time (chronic).  Come and go as damaged nerves heal, or it may stay at the same level for years.  Cause emotional distress, loss of sleep, and a lower quality of life. What are the causes? The most common cause of this condition is diabetes. Many other diseases and conditions can also cause neuropathic pain. Causes of neuropathic pain can be classified as:  Toxic. This is caused by medicines and chemicals. The most common cause of toxic neuropathic pain is damage from cancer treatments (chemotherapy).  Metabolic. This can be caused by: ? Diabetes. This is the most common disease that damages the nerves. ? Lack of vitamin B from long-term alcohol abuse.  Traumatic. Any injury that cuts, crushes, or stretches a nerve can cause damage and pain. A common example is feeling pain after losing an arm or leg (phantom limb pain).  Compression-related. If a sensory nerve gets trapped or compressed for a long period of time, the blood supply to the nerve can be cut off.  Vascular. Many blood vessel diseases can cause neuropathic pain by decreasing blood supply and oxygen to  nerves.  Autoimmune. This type of pain results from diseases in which the body's defense system (immune system) mistakenly attacks sensory nerves. Examples of autoimmune diseases that can cause neuropathic pain include lupus and multiple sclerosis.  Infectious. Many types of viral infections can damage sensory nerves and cause pain. Shingles infection is a common cause of this type of pain.  Inherited. Neuropathic pain can be a symptom of many diseases that are passed down through families (genetic). What increases the risk? You are more likely to develop this condition if:  You have diabetes.  You smoke.  You drink too much alcohol.  You are taking certain medicines, including medicines that kill cancer cells (chemotherapy) or that treat immune system disorders. What are the signs or symptoms? The main symptom is pain. Neuropathic pain is often described as:  Burning.  Shock-like.  Stinging.  Hot or cold.  Itching. How is this diagnosed? No single test can diagnose neuropathic pain. It is diagnosed based on:  Physical exam and your symptoms. Your health care provider will ask you about your pain. You may be asked to use a pain scale to describe how bad your pain is.  Tests. These may be done to see if you have a high sensitivity to pain and to help find the cause and location of any sensory nerve damage. They include: ? Nerve conduction studies to test how well nerve signals travel through your sensory nerves (electrodiagnostic testing). ? Stimulating your sensory nerves through electrodes on your skin and measuring  the response in your spinal cord and brain (somatosensory evoked potential).  Imaging studies, such as: ? X-rays. ? CT scan. ? MRI. How is this treated? Treatment for neuropathic pain may change over time. You may need to try different treatment options or a combination of treatments. Some options include:  Treating the underlying cause of the neuropathy,  such as diabetes, kidney disease, or vitamin deficiencies.  Stopping medicines that can cause neuropathy, such as chemotherapy.  Medicine to relieve pain. Medicines may include: ? Prescription or over-the-counter pain medicine. ? Anti-seizure medicine. ? Antidepressant medicines. ? Pain-relieving patches that are applied to painful areas of skin. ? A medicine to numb the area (local anesthetic), which can be injected as a nerve block.  Transcutaneous nerve stimulation. This uses electrical currents to block painful nerve signals. The treatment is painless.  Alternative treatments, such as: ? Acupuncture. ? Meditation. ? Massage. ? Physical therapy. ? Pain management programs. ? Counseling. Follow these instructions at home: Medicines   Take over-the-counter and prescription medicines only as told by your health care provider.  Do not drive or use heavy machinery while taking prescription pain medicine.  If you are taking prescription pain medicine, take actions to prevent or treat constipation. Your health care provider may recommend that you: ? Drink enough fluid to keep your urine pale yellow. ? Eat foods that are high in fiber, such as fresh fruits and vegetables, whole grains, and beans. ? Limit foods that are high in fat and processed sugars, such as fried or sweet foods. ? Take an over-the-counter or prescription medicine for constipation. Lifestyle   Have a good support system at home.  Consider joining a chronic pain support group.  Do not use any products that contain nicotine or tobacco, such as cigarettes and e-cigarettes. If you need help quitting, ask your health care provider.  Do not drink alcohol. General instructions  Learn as much as you can about your condition.  Work closely with all your health care providers to find the treatment plan that works best for you.  Ask your health care provider what activities are safe for you.  Keep all follow-up  visits as told by your health care provider. This is important. Contact a health care provider if:  Your pain treatments are not working.  You are having side effects from your medicines.  You are struggling with tiredness (fatigue), mood changes, depression, or anxiety. Summary  Neuropathic pain is pain caused by damage to the nerves that are responsible for certain sensations in your body (sensory nerves).  Neuropathic pain may come and go as damaged nerves heal, or it may stay at the same level for years.  Neuropathic pain is usually a long-term condition that can be difficult to treat. Consider joining a chronic pain support group. This information is not intended to replace advice given to you by your health care provider. Make sure you discuss any questions you have with your health care provider. Document Revised: 06/13/2018 Document Reviewed: 03/09/2017 Elsevier Patient Education  [employer].   Restless Legs Syndrome Restless legs syndrome is a condition that causes uncomfortable feelings or sensations in the legs, especially while sitting or lying down. The sensations usually cause an overwhelming urge to move the legs. The arms can also sometimes be affected. The condition can range from mild to severe. The symptoms often interfere with a person's ability to sleep. What are the causes? The cause of this condition is not known. What increases the  risk? The following factors may make you more likely to develop this condition:  Being older than 50.  Pregnancy.  Being a woman. In general, the condition is more common in women than in men.  A family history of the condition.  Having iron deficiency.  Overuse of caffeine, nicotine, or alcohol.  Certain medical conditions, such as kidney disease, Parkinson's disease, or nerve damage.  Certain medicines, such as those for high blood pressure, nausea, colds, allergies, depression, and some heart conditions. What  are the signs or symptoms? The main symptom of this condition is uncomfortable sensations in the legs, such as:  Pulling.  Tingling.  Prickling.  Throbbing.  Crawling.  Burning. Usually, the sensations:  Affect both sides of the body.  Are worse when you sit or lie down.  Are worse at night. These may wake you up or make it difficult to fall asleep.  Make you have a strong urge to move your legs.  Are temporarily relieved by moving your legs. The arms can also be affected, but this is rare. People who have this condition often have tiredness during the day because of their lack of sleep at night. How is this diagnosed? This condition may be diagnosed based on:  Your symptoms.  Blood tests. In some cases, you may be monitored in a sleep lab by a specialist (a sleep study). This can detect any disruptions in your sleep. How is this treated? This condition is treated by managing the symptoms. This may include:  Lifestyle changes, such as exercising, using relaxation techniques, and avoiding caffeine, alcohol, or tobacco.  Medicines. Anti-seizure medicines may be tried first. Follow these instructions at home:     General instructions  Take over-the-counter and prescription medicines only as told by your health care provider.  Use methods to help relieve the uncomfortable sensations, such as: ? Massaging your legs. ? Walking or stretching. ? Taking a cold or hot bath.  Keep all follow-up visits as told by your health care provider. This is important. Lifestyle  Practice good sleep habits. For example, go to bed and get up at the same time every day. Most adults should get 7-9 hours of sleep each night.  Exercise regularly. Try to get at least 30 minutes of exercise most days of the week.  Practice ways of relaxing, such as yoga or meditation.  Avoid caffeine and alcohol.  Do not use any products that contain nicotine or tobacco, such as cigarettes and  e-cigarettes. If you need help quitting, ask your health care provider. Contact a health care provider if:  Your symptoms get worse or they do not improve with treatment. Summary  Restless legs syndrome is a condition that causes uncomfortable feelings or sensations in the legs, especially while sitting or lying down.  The symptoms often interfere with a person's ability to sleep.  This condition is treated by managing the symptoms. You may need to make lifestyle changes or take medicines. This information is not intended to replace advice given to you by your health care provider. Make sure you discuss any questions you have with your health care provider. Document Revised: 03/12/2017 Document Reviewed: 03/12/2017 Elsevier Patient Education  [place].   Duloxetine delayed-release capsules What is this medicine? DULOXETINE (doo LOX e teen) is used to treat depression, anxiety, and different types of chronic pain. This medicine may be used for other purposes; ask your health care provider or pharmacist if you have questions. COMMON BRAND NAME(S): Cymbalta, Drizalma,  [place]ren[place] What should I tell my health care provider before I take this medicine? They need to know if you have any of these conditions:  bipolar disorder  glaucoma  high blood pressure  kidney disease  liver disease  seizures  suicidal thoughts, plans or attempt; a previous suicide attempt by you or a family member  take medicines that treat or prevent blood clots  taken medicines called MAOIs like Carbex, Eldepryl, Marplan, Nardil, and Parnate within 14 days  trouble passing urine  an unusual reaction to duloxetine, other medicines, foods, dyes, or preservatives  pregnant or trying to get pregnant  breast-feeding How should I use this medicine? Take this medicine by mouth with a glass of water. Follow the directions on the prescription label. Do not crush, cut or chew some capsules of this  medicine. Some capsules may be opened and sprinkled on applesauce. Check with your doctor or pharmacist if you are not sure. You can take this medicine with or without food. Take your medicine at regular intervals. Do not take your medicine more often than directed. Do not stop taking this medicine suddenly except upon the advice of your doctor. Stopping this medicine too quickly may cause serious side effects or your condition may worsen. A special MedGuide will be given to you by the pharmacist with each prescription and refill. Be sure to read this information carefully each time. Talk to your pediatrician regarding the use of this medicine in children. While this drug may be prescribed for children as young as 39 years of age for selected conditions, precautions do apply. Overdosage: If you think you have taken too much of this medicine contact a poison control center or emergency room at once. NOTE: This medicine is only for you. Do not share this medicine with others. What if I miss a dose? If you miss a dose, take it as soon as you can. If it is almost time for your next dose, take only that dose. Do not take double or extra doses. What may interact with this medicine? Do not take this medicine with any of the following medications:  desvenlafaxine  levomilnacipran  linezolid  MAOIs like Carbex, Eldepryl, Marplan, Nardil, and Parnate  methylene blue (injected into a vein)  milnacipran  thioridazine  venlafaxine This medicine may also interact with the following medications:  alcohol  amphetamines  aspirin and aspirin-like medicines  certain antibiotics like ciprofloxacin and enoxacin  certain medicines for blood pressure, heart disease, irregular heart beat  certain medicines for depression, anxiety, or psychotic disturbances  certain medicines for migraine headache like almotriptan, eletriptan, frovatriptan, naratriptan, rizatriptan, sumatriptan, zolmitriptan  certain  medicines that treat or prevent blood clots like warfarin, enoxaparin, and dalteparin  cimetidine  fentanyl  lithium  NSAIDS, medicines for pain and inflammation, like ibuprofen or naproxen  phentermine  procarbazine  rasagiline  sibutramine  St. John's wort  theophylline  tramadol  tryptophan This list may not describe all possible interactions. Give your health care provider a list of all the medicines, herbs, non-prescription drugs, or dietary supplements you use. Also tell them if you smoke, drink alcohol, or use illegal drugs. Some items may interact with your medicine. What should I watch for while using this medicine? Tell your doctor if your symptoms do not get better or if they get worse. Visit your doctor or healthcare provider for regular checks on your progress. Because it may take several weeks to see the full effects of this medicine, it is important  to continue your treatment as prescribed by your doctor. This medicine may cause serious skin reactions. They can happen weeks to months after starting the medicine. Contact your healthcare provider right away if you notice fevers or flu-like symptoms with a rash. The rash may be red or purple and then turn into blisters or peeling of the skin. Or, you might notice a red rash with swelling of the face, lips, or lymph nodes in your neck or under your arms. Patients and their families should watch out for new or worsening thoughts of suicide or depression. Also watch out for sudden changes in feelings such as feeling anxious, agitated, panicky, irritable, hostile, aggressive, impulsive, severely restless, overly excited and hyperactive, or not being able to sleep. If this happens, especially at the beginning of treatment or after a change in dose, call your healthcare provider. You may get drowsy or dizzy. Do not drive, use machinery, or do anything that needs mental alertness until you know how this medicine affects you. Do not  stand or sit up quickly, especially if you are an older patient. This reduces the risk of dizzy or fainting spells. Alcohol may interfere with the effect of this medicine. Avoid alcoholic drinks. This medicine can cause an increase in blood pressure. This medicine can also cause a sudden drop in your blood pressure, which may make you feel faint and increase the chance of a fall. These effects are most common when you first start the medicine or when the dose is increased, or during use of other medicines that can cause a sudden drop in blood pressure. Check with your doctor for instructions on monitoring your blood pressure while taking this medicine. Your mouth may get dry. Chewing sugarless gum or sucking hard candy, and drinking plenty of water, may help. Contact your doctor if the problem does not go away or is severe. What side effects may I notice from receiving this medicine? Side effects that you should report to your doctor or health care professional as soon as possible:  allergic reactions like skin rash, itching or hives, swelling of the face, lips, or tongue  anxious  breathing problems  confusion  changes in vision  chest pain  confusion  elevated mood, decreased need for sleep, racing thoughts, impulsive behavior  eye pain  fast, irregular heartbeat  feeling faint or lightheaded, falls  feeling agitated, angry, or irritable  hallucination, loss of contact with reality  high blood pressure  loss of balance or coordination  palpitations  redness, blistering, peeling or loosening of the skin, including inside the mouth  restlessness, pacing, inability to keep still  seizures  stiff muscles  suicidal thoughts or other mood changes  trouble passing urine or change in the amount of urine  trouble sleeping  unusual bleeding or bruising  unusually weak or tired  vomiting  yellowing of the eyes or skin Side effects that usually do not require medical  attention (report to your doctor or health care professional if they continue or are bothersome):  change in sex drive or performance  change in appetite or weight  constipation  dizziness  dry mouth  headache  increased sweating  nausea  tired This list may not describe all possible side effects. Call your doctor for medical advice about side effects. You may report side effects to FDA at 1-800-FDA-1088. Where should I keep my medicine? Keep out of the reach of children. Store at room temperature between 15 and 30 degrees C (59 to  86 degrees F). Throw away any unused medicine after the expiration date. NOTE: This sheet is a summary. It may not cover all possible information. If you have questions about this medicine, talk to your doctor, pharmacist, or health care provider.  2020 Elsevier/Gold Standard (2018-05-23 13:47:50)

## 2019-10-08 NOTE — Progress Notes (Signed)
PATIENT: Andrea Byrd DOB: 03-14-1945  REASON FOR VISIT: follow up HISTORY FROM: patient  Chief Complaint  Patient presents with  . Follow-up    Rm 1 here for a f/u on RLS. Pt said the burning and pins and needle feeling is worse. Pt also said he has sharp pains through her foot.     HISTORY OF PRESENT ILLNESS: Today 10/08/19 Andrea Byrd is a 74 y.o. female here today for follow up for RLS and neuropathic pain of both feet. She continues gabapentin 600mg  BID and pramipexole 1mg  BID. She reports symptoms are about the same. She does have sharp shooting pains of both feel regularly. She does not wish to change treatment plan at this time. She is selling her home and building a smaller home. She continues to provide care to her disabled husband. Her sister passed away last month. She is seen regularly by PCP who currently refills all medications.   HISTORY: (copied from my note on 10/07/2018)  Andrea Byrd is a 74 y.o. female here today for follow up for restless legs. She continues pramipexole 1mg  BID and gabapentin 600mg  BID. She does continue to have aching in bilateral legs at night. She feels that this is manageable and does not wish to increase/add medications. 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 She is tolerating medications well with no adverse effects noted.   HISTORY: (copied from Dr Gladstone Lighter note on 10/02/2017)  UPDATE (10/02/17, VRP): Since last visit, doingwell. Symptoms aremild. Severityis mild. No alleviating or aggravating factors. Toleratingmeds.(on side note, pt husband is in hospital with stroke, and planning to be d/c'd today).  UPDATE 08/29/16: Since last visit, RLS stable on gabapentin and pramipexole. Sharp pains in toes and feet slightly worse.   UPDATE 09/01/15: Since last visit, doing well. On gabapentin 600mg  BID + pramipexole 1mg  BID. Sxs well controlled. No side effects.   UPDATE 08/25/14: Since  last visit, doing well. Has had chiro tx for back issues and feels better. On gabapentin 300mg  TID + pramipex 1mg  qhs. Satisfied with current dosing.   UPDATE 04/17/14: Since last visit, symptoms have continued. Numbness in feet, restless legs at night. On gabapentin 300mg  TID. PCP also tried her on pramipexole 0.25mg x3 at bedtime.   UPDATE 08/13/12: 74 year old right-handed female with history of hypertension, restless leg syndrome, here for evaluation of progressive numbness and tingling in her toes and feet. I previously saw this patient in October 2013 as referred by primary care physician. Now patient is referred to me by orthopedic surgery. Since last visit patient's symptoms have progressed. Now she has numbness and tingling in her bilateral feet up to her ankles. She has intermittent sharp sensations as well. Symptoms are fairly symmetric. No symptoms in her upper legs, lower back, torso, arms or neck.  PRIOR HPI (12/18/11): 74 year old right-handed female with history of hypertension, this is a syndrome, here for evaluation of numbness in her feet for the past 3 months. Patient describes numbness and tingling in her toes and bottom of her feet. No significant pain. She also feels coldness in her feet. No back pain or symptoms radiating from her back to her feet. No symptoms in her hands or fingers. No neck pain. Symptoms are mild and gradually progressing. 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 She did not have any numbness in the symptoms  at that time.    REVIEW OF SYSTEMS: Out of a complete 14 system review of symptoms, the patient complains only of the following symptoms, restless legs, dysesthesias and all other reviewed systems are negative.   ALLERGIES: Allergies  Allergen Reactions  . Nitrofurantoin     Makes mouth break out/blisters  . Propoxyphene N-Acetaminophen     REACTION:  nausea  . Nexium [Esomeprazole Magnesium] Nausea And Vomiting  . Nortriptyline Hcl Nausea And Vomiting  . Hydrocodone-Homatropine Itching and Rash  . Morphine Itching and Rash    HOME MEDICATIONS: Outpatient Medications Prior to Visit  Medication Sig Dispense Refill  . aspirin EC 81 MG tablet Take 1 tablet (81 mg total) by mouth daily. 90 tablet 0  . gabapentin (NEURONTIN) 300 MG capsule Take 2 capsules (600 mg total) by mouth 2 (two) times daily. 360 capsule 1  . latanoprost (XALATAN) 0.005 % ophthalmic solution Place 1 drop into both eyes at bedtime.     Marland Kitchen losartan-hydrochlorothiazide (HYZAAR) 50-12.5 MG tablet Take 1 tablet by mouth daily. 90 tablet 1  . metoprolol succinate (TOPROL-XL) 100 MG 24 hr tablet TAKE 1 TABLET BY MOUTH DAILY. NO FURTHER REFILLS WITHOUT AN APPT. 90 tablet 1  . pramipexole (MIRAPEX) 1 MG tablet TAKE 1 TABLET BY MOUTH 2 TIMES DAILY 180 tablet 1  . tiZANidine (ZANAFLEX) 4 MG tablet TAKE 1 TABLET (4 MG TOTAL) BY MOUTH 3 (THREE) TIMES DAILY AS NEEDED FOR MUSCLE SPASMS. 45 tablet 1   No facility-administered medications prior to visit.    PAST MEDICAL HISTORY: Past Medical History:  Diagnosis Date  . Anemia   . Arthritis   . Cervical cancer ([place]) 1977  . Diverticulosis   . Endometriosis   . Esophageal stricture   . Gastritis   . GERD (gastroesophageal reflux disease)   . Glaucoma   . Herpes simplex type 1 infection   . Hiatal hernia   . History of colon polyps   . History of gallstones   . Hypertension   . IBS (irritable bowel syndrome)   . Insomnia   . Post-operative nausea and vomiting   . Restless leg syndrome     PAST SURGICAL HISTORY: Past Surgical History:  Procedure Laterality Date  . ABDOMINAL HYSTERECTOMY  1998  . APPENDECTOMY  1998  . CERVICAL CONE BIOPSY    . CHOLECYSTECTOMY  1980  . COLON RESECTION  2002  . COLON SURGERY  2003   removed 2 feet colon  . ESOPHAGEAL MANOMETRY N/A 04/24/2016   Procedure: ESOPHAGEAL MANOMETRY (EM);   Surgeon: Mauri Pole, MD;  Location: [facility];  Service: Endoscopy;  Laterality: N/A;  . HAND SURGERY     right/ thumb joints replaced both hands  . OOPHORECTOMY    . TUBAL LIGATION      FAMILY HISTORY: Family History  Problem Relation Age of Onset  . Diabetes Mother   . Stroke Mother        brother, sister, MGM  . Colon polyps Mother   . Heart disease Father   . Kidney disease Father   . Colon polyps Father   . Lung disease Father   . Kidney cancer Sister   . Stroke Sister   . Diabetes Sister   . Colon polyps Sister   . Celiac disease Other        niece  . Heart attack Paternal Grandfather   . Colon cancer Neg Hx   . Esophageal cancer Neg Hx   . Rectal cancer Neg  Hx   . Stomach cancer Neg Hx     SOCIAL HISTORY: Social History   Socioeconomic History  . Marital status: Married    Spouse name: Not on file  . Number of children: 1  . Years of education: Not on file  . Highest education level: Not on file  Occupational History  . Occupation: Retired  Tobacco Use  . Smoking status: Never Smoker  . Smokeless tobacco: Never Used  Vaping Use  . Vaping Use: Never used  Substance and Sexual Activity  . Alcohol use: No  . Drug use: No  . Sexual activity: Not on file  Other Topics Concern  . Not on file  Social History Narrative   Lives with husband floyd   Drinks maybe 4 sodas a week   Social Determinants of Radio broadcast assistant Strain:   . Difficulty of Paying Living Expenses:   Food Insecurity: No Food Insecurity  . Worried About Charity fundraiser in the Last Year: Never true  . Ran Out of Food in the Last Year: Never true  Transportation Needs: No Transportation Needs  . Lack of Transportation (Medical): No  . Lack of Transportation (Non-Medical): No  Physical Activity:   . Days of Exercise per Week:   . Minutes of Exercise per Session:   Stress:   . Feeling of Stress :   Social Connections:   . Frequency of Communication with  Friends and Family:   . Frequency of Social Gatherings with Friends and Family:   . Attends Religious Services:   . Active Member of Clubs or Organizations:   . Attends Archivist Meetings:   Marland Kitchen Marital Status:   Intimate Partner Violence:   . Fear of Current or Ex-Partner:   . Emotionally Abused:   Marland Kitchen Physically Abused:   . Sexually Abused:       PHYSICAL EXAM  Vitals:   10/08/19 0853  BP: 103/60  Pulse: 70  Weight: 185 lb (83.9 kg)  Height: 5\' 1"  (1.549 m)   Body mass index is 34.96 kg/m.  Generalized: Well developed, in no acute distress  Cardiology: normal rate and rhythm, no murmur noted Respiratory: clear to auscultation bilaterally  Neurological examination  Mentation: Alert oriented to time, place, history taking. Follows all commands speech and language fluent Cranial nerve II-XII: Pupils were equal round reactive to light. Extraocular movements were full, visual field were full on confrontational test. Facial sensation and strength were normal. Uvula tongue midline. Head turning and shoulder shrug  were normal and symmetric. Motor: The motor testing reveals 5 over 5 strength of all 4 extremities. Good symmetric motor tone is noted throughout.  Sensory: Sensory testing is intact to soft touch on all 4 extremities. No evidence of extinction is noted.  Coordination: Cerebellar testing reveals good finger-nose-finger and heel-to-shin bilaterally.  Gait and station: Gait is normal.  Reflexes: Deep tendon reflexes are brisk but symmetric bilaterally.   DIAGNOSTIC DATA (LABS, IMAGING, TESTING) - I reviewed patient records, labs, notes, testing and imaging myself where available.  MMSE - Mini Mental State Exam 09/26/2017  Orientation to time 5  Orientation to Place 5  Registration 3  Attention/ Calculation 5  Recall 3  Language- name 2 objects 2  Language- repeat 1  Language- follow 3 step command 3  Language- read & follow direction 1  Write a sentence 1    Copy design 1  Total score 30     Lab Results  Component  Value Date   WBC 10.7 (H) 08/14/2019   HGB 12.1 08/14/2019   HCT 36.5 08/14/2019   MCV 86.8 08/14/2019   PLT 276.0 08/14/2019      Component Value Date/Time   NA 139 08/14/2019 1121   NA 141 10/11/2015 0000   K 3.8 08/14/2019 1121   CL 101 08/14/2019 1121   CO2 32 08/14/2019 1121   GLUCOSE 162 (H) 08/14/2019 1121   BUN 14 08/14/2019 1121   BUN 13 10/11/2015 0000   CREATININE 0.86 08/14/2019 1121   CREATININE 0.88 08/06/2017 1651   CALCIUM 9.2 08/14/2019 1121   PROT 6.0 08/14/2019 1121   ALBUMIN 3.8 08/14/2019 1121   AST 16 08/14/2019 1121   ALT 18 08/14/2019 1121   ALKPHOS 103 08/14/2019 1121   BILITOT 0.3 08/14/2019 1121   GFRNONAA >60 10/05/2017 1535   GFRAA >60 10/05/2017 1535   Lab Results  Component Value Date   CHOL 158 08/14/2019   HDL 40.10 08/14/2019   LDLCALC 87 08/14/2019   TRIG 156.0 (H) 08/14/2019   CHOLHDL 4 08/14/2019   No results found for: HGBA1C No results found for: VITAMINB12 Lab Results  Component Value Date   TSH 1.28 08/14/2019      ASSESSMENT AND PLAN 74 y.o. year old female  has a past medical history of Anemia, Arthritis, Cervical cancer ([place]) (1977), Diverticulosis, Endometriosis, Esophageal stricture, Gastritis, GERD (gastroesophageal reflux disease), Glaucoma, Herpes simplex type 1 infection, Hiatal hernia, History of colon polyps, History of gallstones, Hypertension, IBS (irritable bowel syndrome), Insomnia, Post-operative nausea and vomiting, and Restless leg syndrome. here with     ICD-10-CM   1. Restless legs syndrome (RLS)  G25.81   2. Dysesthesia  R20.8     Meilyn reports that symptoms are farily stable. She wishes to continue current treatment plan. We will continue gabapentin 600mg  BID and pramipexole 1mg  BID. PCP is currently refilling medications. We have discussed increasing dosage versus adding low dose duloxetine in the future if needed. She was encouraged to  continue focusing on healthy lifestyle habits. She will follow up with me annually or return to PCP for refills if she wishes. She verbalizes understanding and agreement with this plan.    No orders of the defined types were placed in this encounter.    No orders of the defined types were placed in this encounter.     I spent 20 minutes with the patient. 50% of this time was spent counseling and educating patient on plan of care and medications.    Debbora Presto, FNP-C 10/08/2019, 9:57 AM [facility] [address], [place], [place] [address] [phone]

## 2019-10-10 ENCOUNTER — Telehealth: Payer: Self-pay

## 2019-10-10 NOTE — Progress Notes (Signed)
I reviewed note and agree with plan.   Penni Bombard, MD 03/10/7260, 03:55 PM Certified in Neurology, Neurophysiology and Neuroimaging  [facility] [address] [place], [place] [address] [phone]

## 2019-10-10 NOTE — Progress Notes (Signed)
Dexilant PAP - Patient section completed and reviewed, form faxed to Dr. Silverio Decamp.  Madelin Rear, Pharm.D., BCGP Clinical Pharmacist [facility] [phone]

## 2019-10-13 ENCOUNTER — Telehealth: Payer: Self-pay | Admitting: *Deleted

## 2019-10-13 NOTE — Telephone Encounter (Signed)
Erica from [employer] is requesting to have the application forms for the pt refaxed over if possible.  CB [phone]

## 2019-10-13 NOTE — Telephone Encounter (Signed)
Dexilant PAP sent to [employer]  for patient assistance for Dexilant today for [facility]

## 2019-10-14 ENCOUNTER — Other Ambulatory Visit: Payer: Self-pay | Admitting: Family Medicine

## 2019-10-17 NOTE — Telephone Encounter (Signed)
Spoke with [place] at [facility] [employer] has been approved.  Medication was delivered to patient by [employer] on 10-16-19.  Tracking # [id number].

## 2019-10-17 NOTE — Telephone Encounter (Signed)
Spoke with [place] at [facility] [employer] has been approved.  Medication was delivered to patient by [employer] on 10-16-19.  Tracking # [id number].

## 2019-10-21 ENCOUNTER — Other Ambulatory Visit: Payer: Self-pay | Admitting: General Practice

## 2019-10-21 MED ORDER — FUROSEMIDE 20 MG PO TABS
20.0000 mg | ORAL_TABLET | Freq: Every day | ORAL | 1 refills | Status: DC
Start: 2019-10-21 — End: 2021-02-18

## 2019-10-21 NOTE — Telephone Encounter (Signed)
We sent  6 month supply of Metoprolol in June.  She was previously on Furosemide 20mg  daily but indicated she was no longer taking this.  If she would like to restart, we can send #90, 1 refill

## 2019-10-21 NOTE — Telephone Encounter (Signed)
Received a fax that pt is requesting a new refill for metoprolol and furosemide. Please advise as I show she should still have metoprolol left and I do not see furosemide on her med list.

## 2019-10-27 DIAGNOSIS — M5416 Radiculopathy, lumbar region: Secondary | ICD-10-CM | POA: Diagnosis not present

## 2019-10-27 DIAGNOSIS — M47816 Spondylosis without myelopathy or radiculopathy, lumbar region: Secondary | ICD-10-CM | POA: Diagnosis not present

## 2019-10-27 DIAGNOSIS — M545 Low back pain: Secondary | ICD-10-CM | POA: Diagnosis not present

## 2019-10-28 ENCOUNTER — Other Ambulatory Visit: Payer: Self-pay | Admitting: Family Medicine

## 2019-10-31 ENCOUNTER — Encounter: Payer: Medicare HMO | Admitting: Gastroenterology

## 2019-11-04 DIAGNOSIS — R69 Illness, unspecified: Secondary | ICD-10-CM | POA: Diagnosis not present

## 2019-11-06 DIAGNOSIS — M5416 Radiculopathy, lumbar region: Secondary | ICD-10-CM | POA: Diagnosis not present

## 2019-11-06 DIAGNOSIS — M545 Low back pain: Secondary | ICD-10-CM | POA: Diagnosis not present

## 2019-11-12 ENCOUNTER — Other Ambulatory Visit: Payer: Self-pay | Admitting: Family Medicine

## 2019-11-12 NOTE — Telephone Encounter (Signed)
Please advise, this says it was last filled 2 weeks ago in the note on the Rx.

## 2019-11-14 ENCOUNTER — Ambulatory Visit (INDEPENDENT_AMBULATORY_CARE_PROVIDER_SITE_OTHER): Payer: Medicare HMO | Admitting: Family Medicine

## 2019-11-14 ENCOUNTER — Other Ambulatory Visit: Payer: Self-pay

## 2019-11-14 ENCOUNTER — Encounter: Payer: Self-pay | Admitting: Family Medicine

## 2019-11-14 VITALS — BP 121/78 | HR 80 | Temp 99.6°F | Resp 17 | Ht 61.0 in | Wt 185.5 lb

## 2019-11-14 DIAGNOSIS — E669 Obesity, unspecified: Secondary | ICD-10-CM

## 2019-11-14 DIAGNOSIS — Z0000 Encounter for general adult medical examination without abnormal findings: Secondary | ICD-10-CM

## 2019-11-14 DIAGNOSIS — M2041 Other hammer toe(s) (acquired), right foot: Secondary | ICD-10-CM

## 2019-11-14 LAB — BASIC METABOLIC PANEL
BUN: 10 mg/dL (ref 6–23)
CO2: 29 mEq/L (ref 19–32)
Calcium: 9 mg/dL (ref 8.4–10.5)
Chloride: 107 mEq/L (ref 96–112)
Creatinine, Ser: 0.69 mg/dL (ref 0.40–1.20)
GFR: 83.26 mL/min (ref >60.00)
Glucose, Bld: 93 mg/dL (ref 70–99)
Potassium: 3.9 mEq/L (ref 3.5–5.1)
Sodium: 142 mEq/L (ref 135–145)

## 2019-11-14 LAB — CBC WITH DIFFERENTIAL/PLATELET
Basophils Absolute: 0.1 10*3/uL (ref 0.0–0.1)
Basophils Relative: 1 % (ref 0.0–3.0)
Eosinophils Absolute: 0.1 10*3/uL (ref 0.0–0.7)
Eosinophils Relative: 1.5 % (ref 0.0–5.0)
HCT: 36.9 % (ref 36.0–46.0)
Hemoglobin: 12.2 g/dL (ref 12.0–15.0)
Lymphocytes Relative: 16.8 % (ref 12.0–46.0)
Lymphs Abs: 1.3 10*3/uL (ref 0.7–4.0)
MCHC: 33.1 g/dL (ref 30.0–36.0)
MCV: 89.4 fl (ref 78.0–100.0)
Monocytes Absolute: 0.4 10*3/uL (ref 0.1–1.0)
Monocytes Relative: 5.1 % (ref 3.0–12.0)
Neutro Abs: 5.9 10*3/uL (ref 1.4–7.7)
Neutrophils Relative %: 75.6 % (ref 43.0–77.0)
Platelets: 254 10*3/uL (ref 150.0–400.0)
RBC: 4.13 Mil/uL (ref 3.87–5.11)
RDW: 15 % (ref 11.5–15.5)
WBC: 7.8 10*3/uL (ref 4.0–10.5)

## 2019-11-14 LAB — LIPID PANEL
Cholesterol: 152 mg/dL (ref 0–200)
HDL: 40.6 mg/dL (ref >39.00)
LDL Cholesterol: 87 mg/dL (ref 0–99)
NonHDL: 111.68
Total CHOL/HDL Ratio: 4
Triglycerides: 123 mg/dL (ref 0.0–149.0)
VLDL: 24.6 mg/dL (ref 0.0–40.0)

## 2019-11-14 LAB — HEPATIC FUNCTION PANEL
ALT: 17 U/L (ref 0–35)
AST: 17 U/L (ref 0–37)
Albumin: 3.9 g/dL (ref 3.5–5.2)
Alkaline Phosphatase: 84 U/L (ref 39–117)
Bilirubin, Direct: 0.1 mg/dL (ref 0.0–0.3)
Total Bilirubin: 0.5 mg/dL (ref 0.2–1.2)
Total Protein: 6 g/dL (ref 6.0–8.3)

## 2019-11-14 LAB — TSH: TSH: 1.94 u[IU]/mL (ref 0.35–4.50)

## 2019-11-14 NOTE — Progress Notes (Signed)
   Subjective:    Patient ID: Andrea Byrd, female    DOB: Mar 16, 1945, 74 y.o.   MRN: [id number]  HPI CPE- UTD on COVID vaccines, declines flu (has plans this weekend).  UTD on mammo, colonoscopy.  Reviewed past medical, surgical, family and social histories.   Patient Care Team    Relationship Specialty Notifications Start End  [name], MD PCP - General Family Medicine  01/17/16   Mauri Pole, MD Consulting Physician Gastroenterology  02/23/16   Penni Bombard, MD Consulting Physician Neurology  02/23/16   Harriett Sine, MD Consulting Physician Dermatology  08/30/16   Manson Passey, [place]  Specialist  08/30/16   Madelin Rear, [facility] Pharmacist Pharmacist  06/12/19    Comment: phone number [phone]     Health Maintenance  Topic Date Due  . TETANUS/TDAP  01/03/2020 (Originally 07/29/2015)  . Hepatitis C Screening  01/03/2020 (Originally 06-15-1945)  . INFLUENZA VACCINE  06/03/2020 (Originally 10/05/2019)  . MAMMOGRAM  03/09/2021  . COLONOSCOPY  02/08/2022  . DEXA SCAN  Completed  . COVID-19 Vaccine  Completed  . PNA vac Low Risk Adult  Completed      Review of Systems Patient reports no vision/ hearing changes, adenopathy,fever, weight change,  persistant/recurrent hoarseness, chest pain, palpitations, edema, persistant/recurrent cough, hemoptysis, dyspnea (rest/exertional/paroxysmal nocturnal), gastrointestinal bleeding (melena, rectal bleeding), abdominal pain, bowel changes, GU symptoms (dysuria, hematuria, incontinence), Gyn symptoms (abnormal  bleeding, pain),  syncope, focal weakness, memory loss, skin/hair/nail changes, abnormal bruising or bleeding.   + depression- pt is not interested in medication. + dysphagia- has EGD scheduled + GERD- Dexilant + neuropathy of feet  This visit occurred during the SARS-CoV-2 public health emergency.  Safety protocols were in place, including screening questions prior to the visit, additional usage of staff PPE, and  extensive cleaning of exam room while observing appropriate contact time as indicated for disinfecting solutions.       Objective:   Physical Exam General Appearance:    Alert, cooperative, no distress, appears stated age  Head:    Normocephalic, without obvious abnormality, atraumatic  Eyes:    PERRL, conjunctiva/corneas clear, EOM's intact, fundi    benign, both eyes  Ears:    Normal TM's and external ear canals, both ears  Nose:   Deferred due to COVID  Throat:   Neck:   Supple, symmetrical, trachea midline, no adenopathy;    Thyroid: no enlargement/tenderness/nodules  Back:     Symmetric, no curvature, ROM normal, no CVA tenderness  Lungs:     Clear to auscultation bilaterally, respirations unlabored  Chest Wall:    No tenderness or deformity   Heart:    Regular rate and rhythm, S1 and S2 normal, no murmur, rub   or gallop  Breast Exam:    Deferred to mammo  Abdomen:     Soft, non-tender, bowel sounds active all four quadrants,    no masses, no organomegaly  Genitalia:    Deferred  Rectal:    Extremities:   Extremities normal, atraumatic, no cyanosis or edema  Pulses:   2+ and symmetric all extremities  Skin:   Skin color, texture, turgor normal, no rashes or lesions  Lymph nodes:   Cervical, supraclavicular, and axillary nodes normal  Neurologic:   CNII-XII intact, normal strength, sensation and reflexes    throughout          Assessment & Plan:

## 2019-11-14 NOTE — Assessment & Plan Note (Signed)
Ongoing issue for pt.  Weight is stable.  Stressed need for healthy diet and regular exercise.  Check labs to risk stratify.  Will follow.

## 2019-11-14 NOTE — Assessment & Plan Note (Signed)
Pt's PE WNL w/ exception of obesity and R hammertoes.  UTD on mammo, colonoscopy, pneumonia/COVID vaccines.  Declines flu shot today.  Check labs.  Anticipatory guidance provided.

## 2019-11-14 NOTE — Patient Instructions (Signed)
Follow up in 6 months to recheck BP We'll notify you of your lab results and make any changes if needed We'll call you with your podiatry appt Continue to work on healthy diet and regular exercise- you can do it! IF the mood doesn't improve- or worsens- please let me know Call with any questions or concerns Stay Safe!  Stay Healthy!

## 2019-11-17 ENCOUNTER — Encounter: Payer: Self-pay | Admitting: General Practice

## 2019-11-24 DIAGNOSIS — M47816 Spondylosis without myelopathy or radiculopathy, lumbar region: Secondary | ICD-10-CM | POA: Diagnosis not present

## 2019-11-24 DIAGNOSIS — M545 Low back pain: Secondary | ICD-10-CM | POA: Diagnosis not present

## 2019-11-26 DIAGNOSIS — H401232 Low-tension glaucoma, bilateral, moderate stage: Secondary | ICD-10-CM | POA: Diagnosis not present

## 2019-11-27 ENCOUNTER — Other Ambulatory Visit: Payer: Self-pay | Admitting: Family Medicine

## 2019-11-30 ENCOUNTER — Other Ambulatory Visit: Payer: Self-pay | Admitting: Family Medicine

## 2019-12-01 ENCOUNTER — Other Ambulatory Visit: Payer: Self-pay | Admitting: Family Medicine

## 2019-12-03 DIAGNOSIS — M79671 Pain in right foot: Secondary | ICD-10-CM | POA: Diagnosis not present

## 2019-12-24 ENCOUNTER — Encounter: Payer: Medicare HMO | Admitting: Gastroenterology

## 2019-12-28 DIAGNOSIS — R69 Illness, unspecified: Secondary | ICD-10-CM | POA: Diagnosis not present

## 2020-01-05 DIAGNOSIS — M47816 Spondylosis without myelopathy or radiculopathy, lumbar region: Secondary | ICD-10-CM | POA: Diagnosis not present

## 2020-01-05 DIAGNOSIS — M545 Low back pain, unspecified: Secondary | ICD-10-CM | POA: Diagnosis not present

## 2020-01-30 ENCOUNTER — Other Ambulatory Visit: Payer: Self-pay | Admitting: Family Medicine

## 2020-01-30 DIAGNOSIS — G2581 Restless legs syndrome: Secondary | ICD-10-CM

## 2020-02-09 ENCOUNTER — Telehealth: Payer: Self-pay | Admitting: Family Medicine

## 2020-02-09 NOTE — Telephone Encounter (Signed)
Left message for patient to schedule Annual Wellness Visit.  Please schedule with Nurse Health Advisor Martha Stanley, RN at [place]  

## 2020-03-03 DIAGNOSIS — Z23 Encounter for immunization: Secondary | ICD-10-CM | POA: Diagnosis not present

## 2020-03-11 ENCOUNTER — Ambulatory Visit: Payer: Medicare HMO

## 2020-03-17 ENCOUNTER — Other Ambulatory Visit: Payer: Self-pay | Admitting: Family Medicine

## 2020-03-17 DIAGNOSIS — G2581 Restless legs syndrome: Secondary | ICD-10-CM

## 2020-03-18 ENCOUNTER — Ambulatory Visit: Payer: Medicare HMO

## 2020-03-18 DIAGNOSIS — K219 Gastro-esophageal reflux disease without esophagitis: Secondary | ICD-10-CM

## 2020-03-18 DIAGNOSIS — I10 Essential (primary) hypertension: Secondary | ICD-10-CM

## 2020-03-18 NOTE — Progress Notes (Signed)
[facility]  Name: Andrea Byrd  MRN: [id number] DOB: 1945/11/03  Chief Complaint/ HPI Andrea Byrd,  75 y.o. , female presents for their Follow-Up CCM visit with the clinical pharmacist via telephone.  PCP : [name], MD   Encounter Diagnoses  Name Primary?  . Gastroesophageal reflux disease, unspecified whether esophagitis present Yes  . Essential hypertension    Medications: Outpatient Encounter Medications as of 03/18/2020  Medication Sig  . albuterol (VENTOLIN HFA) 108 (90 Base) MCG/ACT inhaler TAKE 2 PUFFS BY MOUTH EVERY 6 HOURS AS NEEDED FOR WHEEZE OR SHORTNESS OF BREATH  . aspirin EC 81 MG tablet Take 1 tablet (81 mg total) by mouth daily.  . furosemide (LASIX) 20 MG tablet Take 1 tablet (20 mg total) by mouth daily.  Marland Kitchen gabapentin (NEURONTIN) 300 MG capsule TAKE 2 CAPSULES (600 MG TOTAL) BY MOUTH 2 (TWO) TIMES DAILY.  Marland Kitchen latanoprost (XALATAN) 0.005 % ophthalmic solution Place 1 drop into both eyes at bedtime.   Marland Kitchen losartan-hydrochlorothiazide (HYZAAR) 50-12.5 MG tablet TAKE 1 TABLET BY MOUTH EVERY DAY  . metoprolol succinate (TOPROL-XL) 100 MG 24 hr tablet TAKE 1 TABLET BY MOUTH DAILY. NO FURTHER REFILLS WITHOUT AN APPT.  . pramipexole (MIRAPEX) 1 MG tablet TAKE 1 TABLET BY MOUTH 2 TIMES DAILY  . tiZANidine (ZANAFLEX) 4 MG tablet TAKE 1 TABLET (4 MG TOTAL) BY MOUTH 3 (THREE) TIMES DAILY AS NEEDED FOR MUSCLE SPASMS.   No facility-administered encounter medications on file as of 03/18/2020.   Hypertension   BP Readings from Last 3 Encounters:  11/14/19 121/78  10/08/19 103/60  08/25/19 100/60   Checking BP at home several times per month. BP at home was 115/70 last night. Changes to diet and exercise has been a challenge due to involvement as caregiver to husband. Has not had any issues with fluid retention since stopping furosemide.  Denies dizziness. Patient is currently controlled on the following medications:   Losartan-hctz 50-12.5 mg  daily  Metoprolol succinate 100 mg daily  Reviewed diet - Maintain a healthy weight and exercise regularly, as directed by your health care provider. Eat healthy foods, such as: Lean proteins, complex carbohydrates, fresh fruits and vegetables, low-fat dairy products, healthy fats.  Plan  Continue current medications.   GERD   Reports acid reflux has been well controlled. Confirmed that patient assistance had been approved on the [place] and has refilled second order through PAP without a problem.   Dexilant 60 mg daily (samples)  Reviewed cost concerns - no longer an issue.   Plan   Continue current medication. PAP support as needed.  Vaccines   Immunization History  Administered Date(s) Administered  . Fluad Quad(high Dose 65+) 11/10/2018, 12/28/2019  . Influenza, High Dose Seasonal PF 01/11/2018  . Influenza,inj,Quad PF,6+ Mos 12/23/2014, 10/24/2015, 11/14/2016  . Moderna Sars-Covid-2 Vaccination 04/11/2019, 05/10/2019, 03/03/2020  . Pneumococcal Conjugate-13 09/09/2013  . Pneumococcal Polysaccharide-23 12/20/2011  . Tdap 07/28/2005   Reviewed vaccines needed, pt agreeable to receiving Tdap and Shingrix. Updated record with moderna booster.   Plan  Recommended patient receive Shingrix and Tdap vaccines at pharmacy.  Medication Management   Receives prescription medications from:  [id number] - [place], [place] - [address] [place] [address] Phone: [phone] Fax: [phone]   [facility]   Plan  Continue current medication management strategy.  Follow up:  RPH: 6-8  month phone visit. CPA: 4 month BP call  SDOH assessments performed: Yes.  Future Appointments  Date Time Provider [place]  04/19/2020 11:00 AM [facility] MM 2 [facility]  05/13/2020  8:30 AM [name], MD [facility]  10/15/2020  1:30  PM [facility] PHARMACIST [facility]  _____________________________________  Madelin Rear, Pharm.D. Clinical Pharmacist [facility] [phone]

## 2020-03-18 NOTE — Patient Instructions (Signed)
Ms. Crisanti,  Thank you for taking the time to review your medications with me today.  I have included our care plan/goals in the following pages. Please review and call me at [phone] with any questions!  Thanks! Ellin Mayhew, Pharm.D., BCGP Clinical Pharmacist [facility] at [facility] [phone]  Goals Addressed            This Visit's Progress   . BP <130/80   On track    CARE PLAN ENTRY Current Barriers:  . Current antihypertensive regimen:  o CONTINUE: metoprolol succinate XL 100 mg daily at bedtime and losartan-hydrochlorothiazide 50-12.5 mg daily at bedtime o STOP: furosemide 20 mg daily and notify office of any swelling/fluid retention . Last practice recorded BP readings:  BP Readings from Last 3 Encounters:  07/02/19 100/60  02/04/19 116/63  10/07/18 121/72   Pharmacist Clinical Goal(s):  Marland Kitchen Over the next 180 days, patient will work with PharmD and providers to optimize antihypertensive regimen.  Interventions: . Inter-disciplinary care team collaboration. . Comprehensive medication review performed; medication list updated in the electronic medical record.  o CONTINUE: metoprolol succinate XL 100 mg daily at bedtime and losartan-hydrochlorothiazide 50-12.5 mg daily at bedtime.  Patient Self Care Activities:  . Patient will continue to check BP daily , document, and provide at future appointments. . Patient will focus on medication adherence by utilizing home delivery, medication synchronization, and pill packaging.   Initial goal documentation.    Marland Kitchen GERD: Minimize recurring symptoms   On track    CARE PLAN ENTRY  Current Barriers:  . Chronic Disease Management support, education, and care coordination needs related to  GERD/IBS. Marland Kitchen Current GERD regimen: o STOP: Omeprazole  o START: Dexilant 60 mg once daily first thing in the morning and sucralfate 1 gram twice daily  Pharmacist Clinical Goal(s):  Marland Kitchen Over next 180 days:  Minimize recurring symptoms of GERD.  Interventions: . Comprehensive medication review performed.  Patient Self Care Activities:  . Please call with any questions! . Patient will focus on medication adherence by utilizing home delivery, medication synchronization, and pill packaging.  Initial goal documentation       The patient verbalized understanding of instructions provided today and agreed to receive a MyChart copy of patient instruction and/or educational materials. Telephone follow up appointment with pharmacy team member scheduled for: See next appointment with "Care Management Staff" under "What's Next" below.

## 2020-04-19 ENCOUNTER — Other Ambulatory Visit: Payer: Self-pay

## 2020-04-19 ENCOUNTER — Ambulatory Visit
Admission: RE | Admit: 2020-04-19 | Discharge: 2020-04-19 | Disposition: A | Discharge status: Home / Self Care | Payer: Medicare HMO | Source: Ambulatory Visit | Attending: Family Medicine | Admitting: Family Medicine

## 2020-04-19 DIAGNOSIS — Z1231 Encounter for screening mammogram for malignant neoplasm of breast: Secondary | ICD-10-CM

## 2020-04-29 NOTE — Progress Notes (Signed)
Subjective:   COURTNEY BELLIZZI is a 75 y.o. female who presents for Medicare Annual (Subsequent) preventive examination.  Review of Systems     Cardiac Risk Factors include: advanced age (>52men, >45 women);hypertension;obesity (BMI >30kg/m2);sedentary lifestyle     Objective:    Today's Vitals   05/03/20 0755  BP: 126/64  Pulse: 85  Resp: 16  Temp: 98.6 F (37 C)  TempSrc: Oral  SpO2: 95%  Weight: 179 lb 6.4 oz (81.4 kg)  Height: 5\' 3"  (1.6 m)   Body mass index is 31.78 kg/m.  Advanced Directives 05/03/2020 09/26/2017 08/30/2016 08/25/2014  Does Patient Have a Medical Advance Directive? Yes Yes Yes Yes  Type of Paramedic of [place];Living will [place];Living will Living will;Healthcare Power of Attorney Living will;Healthcare Power of [place] in Chart? No - copy requested No - copy requested No - copy requested No - copy requested    Current Medications (verified) Outpatient Encounter Medications as of 05/03/2020  Medication Sig  . albuterol (VENTOLIN HFA) 108 (90 Base) MCG/ACT inhaler TAKE 2 PUFFS BY MOUTH EVERY 6 HOURS AS NEEDED FOR WHEEZE OR SHORTNESS OF BREATH  . aspirin EC 81 MG tablet Take 1 tablet (81 mg total) by mouth daily.  . DULoxetine (CYMBALTA) 20 MG capsule Take 20 mg by mouth daily. 2 daily  . furosemide (LASIX) 20 MG tablet Take 1 tablet (20 mg total) by mouth daily.  Marland Kitchen gabapentin (NEURONTIN) 300 MG capsule TAKE 2 CAPSULES (600 MG TOTAL) BY MOUTH 2 (TWO) TIMES DAILY.  Marland Kitchen latanoprost (XALATAN) 0.005 % ophthalmic solution Place 1 drop into both eyes at bedtime.   Marland Kitchen losartan-hydrochlorothiazide (HYZAAR) 50-12.5 MG tablet TAKE 1 TABLET BY MOUTH EVERY DAY  . metoprolol succinate (TOPROL-XL) 100 MG 24 hr tablet TAKE 1 TABLET BY MOUTH DAILY. NO FURTHER REFILLS WITHOUT AN APPT.  . pramipexole (MIRAPEX) 1 MG tablet TAKE 1 TABLET BY MOUTH 2 TIMES DAILY  . tiZANidine (ZANAFLEX) 4 MG  tablet TAKE 1 TABLET (4 MG TOTAL) BY MOUTH 3 (THREE) TIMES DAILY AS NEEDED FOR MUSCLE SPASMS.   No facility-administered encounter medications on file as of 05/03/2020.    Allergies (verified) Nitrofurantoin, Propoxyphene n-acetaminophen, Nexium [esomeprazole magnesium], Nortriptyline hcl, Hydrocodone-homatropine, and Morphine   History: Past Medical History:  Diagnosis Date  . Anemia   . Arthritis   . Cervical cancer ([place]) 1977  . Diverticulosis   . Endometriosis   . Esophageal stricture   . Gastritis   . GERD (gastroesophageal reflux disease)   . Glaucoma   . Herpes simplex type 1 infection   . Hiatal hernia   . History of colon polyps   . History of gallstones   . Hypertension   . IBS (irritable bowel syndrome)   . Insomnia   . Post-operative nausea and vomiting   . Restless leg syndrome    Past Surgical History:  Procedure Laterality Date  . ABDOMINAL HYSTERECTOMY  1998  . APPENDECTOMY  1998  . CERVICAL CONE BIOPSY    . CHOLECYSTECTOMY  1980  . COLON RESECTION  2002  . COLON SURGERY  2003   removed 2 feet colon  . ESOPHAGEAL MANOMETRY N/A 04/24/2016   Procedure: ESOPHAGEAL MANOMETRY (EM);  Surgeon: Mauri Pole, MD;  Location: [facility];  Service: Endoscopy;  Laterality: N/A;  . HAND SURGERY     right/ thumb joints replaced both hands  . OOPHORECTOMY    . TUBAL LIGATION  Family History  Problem Relation Age of Onset  . Diabetes Mother   . Stroke Mother        brother, sister, MGM  . Colon polyps Mother   . Heart disease Father   . Kidney disease Father   . Colon polyps Father   . Lung disease Father   . Kidney cancer Sister   . Stroke Sister   . Diabetes Sister   . Colon polyps Sister   . Celiac disease Other        niece  . Heart attack Paternal Grandfather   . Colon cancer Neg Hx   . Esophageal cancer Neg Hx   . Rectal cancer Neg Hx   . Stomach cancer Neg Hx    Social History   Socioeconomic History  . Marital status: Married     Spouse name: Not on file  . Number of children: 1  . Years of education: Not on file  . Highest education level: Not on file  Occupational History  . Occupation: Retired  Tobacco Use  . Smoking status: Never Smoker  . Smokeless tobacco: Never Used  Vaping Use  . Vaping Use: Never used  Substance and Sexual Activity  . Alcohol use: No  . Drug use: No  . Sexual activity: Not on file  Other Topics Concern  . Not on file  Social History Narrative   Lives with husband floyd   Drinks maybe 4 sodas a week   Social Determinants of Health   Financial Resource Strain: Low Risk   . Difficulty of Paying Living Expenses: Not hard at all  Food Insecurity: No Food Insecurity  . Worried About Charity fundraiser in the Last Year: Never true  . Ran Out of Food in the Last Year: Never true  Transportation Needs: No Transportation Needs  . Lack of Transportation (Medical): No  . Lack of Transportation (Non-Medical): No  Physical Activity: Inactive  . Days of Exercise per Week: 0 days  . Minutes of Exercise per Session: 0 min  Stress: Stress Concern Present  . Feeling of Stress : To some extent  Social Connections: Socially Integrated  . Frequency of Communication with Friends and Family: More than three times a week  . Frequency of Social Gatherings with Friends and Family: More than three times a week  . Attends Religious Services: More than 4 times per year  . Active Member of Clubs or Organizations: Yes  . Attends Archivist Meetings: 1 to 4 times per year  . Marital Status: Married    Tobacco Counseling Counseling given: Not Answered   Clinical Intake:  Pre-visit preparation completed: Yes  Pain : No/denies pain     Nutritional Status: BMI > 30  Obese Nutritional Risks: None  How often do you need to have someone help you when you read instructions, pamphlets, or other written materials from your doctor or pharmacy?: 1 - Never  Diabetic?No  Interpreter  Needed?: No  Information entered by :: Caroleen Hamman LPN   Activities of Daily Living In your present state of health, do you have any difficulty performing the following activities: 05/03/2020 11/14/2019  Hearing? N N  Vision? N N  Difficulty concentrating or making decisions? N N  Walking or climbing stairs? N N  Dressing or bathing? N N  Doing errands, shopping? N N  Preparing Food and eating ? N -  Using the Toilet? N -  In the past six months, have you accidently leaked  urine? N -  Do you have problems with loss of bowel control? Y -  Comment occasionally -  Managing your Medications? N -  Managing your Finances? N -  Housekeeping or managing your Housekeeping? N -  Some recent data might be hidden    Patient Care Team: [name], MD as PCP - General (Family Medicine) Mauri Pole, MD as Consulting Physician (Gastroenterology) Penni Bombard, MD as Consulting Physician (Neurology) Harriett Sine, MD as Consulting Physician (Dermatology) Ortho, [facility] (Specialist) Madelin Rear, [facility] as Pharmacist (Pharmacist)  Indicate any recent Medical Services you may have received from other than [facility] providers in the past year (date may be approximate).     Assessment:   This is a routine wellness examination for Kelby.  Hearing/Vision screen  Hearing Screening   125Hz  250Hz  500Hz  1000Hz  2000Hz  3000Hz  4000Hz  6000Hz  8000Hz   Right ear:           Left ear:           Comments: No issues  Vision Screening Comments: Last eye exam-05/2019-Dr. Lyles  Dietary issues and exercise activities discussed: Current Exercise Habits: The patient does not participate in regular exercise at present, Exercise limited by: None identified  Goals    . BP <130/80     CARE PLAN ENTRY Current Barriers:  . Current antihypertensive regimen:  o CONTINUE: metoprolol succinate XL 100 mg daily at bedtime and losartan-hydrochlorothiazide 50-12.5 mg daily at bedtime o STOP:  furosemide 20 mg daily and notify office of any swelling/fluid retention . Last practice recorded BP readings:  BP Readings from Last 3 Encounters:  07/02/19 100/60  02/04/19 116/63  10/07/18 121/72   Pharmacist Clinical Goal(s):  Marland Kitchen Over the next 180 days, patient will work with PharmD and providers to optimize antihypertensive regimen.  Interventions: . Inter-disciplinary care team collaboration. . Comprehensive medication review performed; medication list updated in the electronic medical record.  o CONTINUE: metoprolol succinate XL 100 mg daily at bedtime and losartan-hydrochlorothiazide 50-12.5 mg daily at bedtime.  Patient Self Care Activities:  . Patient will continue to check BP daily , document, and provide at future appointments. . Patient will focus on medication adherence by utilizing home delivery, medication synchronization, and pill packaging.   Initial goal documentation.    Marland Kitchen GERD: Minimize recurring symptoms     CARE PLAN ENTRY  Current Barriers:  . Chronic Disease Management support, education, and care coordination needs related to  GERD/IBS. Marland Kitchen Current GERD regimen: o STOP: Omeprazole  o START: Dexilant 60 mg once daily first thing in the morning and sucralfate 1 gram twice daily  Pharmacist Clinical Goal(s):  Marland Kitchen Over next 180 days: Minimize recurring symptoms of GERD.  Interventions: . Comprehensive medication review performed.  Patient Self Care Activities:  . Please call with any questions! . Patient will focus on medication adherence by utilizing home delivery, medication synchronization, and pill packaging.  Initial goal documentation     . Increase physical activity     Stay active by gardening and yard maintenance.     . Weight (lb) < 180 lb (81.6 kg)     Lose weight by increasing activity.       Depression Screen PHQ 2/9 Scores 05/03/2020 11/14/2019 08/14/2019 02/04/2019 01/03/2019 08/05/2018 05/22/2018  PHQ - 2 Score 1 3 2  0 0 0 0  PHQ- 9 Score  - 12 2 0 0 0 -    Fall Risk Fall Risk  05/03/2020 11/14/2019 08/14/2019 02/04/2019 02/04/2019  Falls in  the past year? 1 0 0 1 1  Comment - - - - -  Number falls in past yr: 0 0 0 0 0  Injury with Fall? 1 1 0 0 0  Risk for fall due to : History of fall(s) - - - -  Follow up Falls prevention discussed Falls evaluation completed Falls evaluation completed Falls evaluation completed Falls evaluation completed    [place]:  Any stairs in or around the home? No  Home free of loose throw rugs in walkways, pet beds, electrical cords, etc? Yes  Adequate lighting in your home to reduce risk of falls? Yes   ASSISTIVE DEVICES UTILIZED TO PREVENT FALLS:  Life alert? No  Use of a cane, walker or w/c? No  Grab bars in the bathroom? Yes  Shower chair or bench in shower? No  Elevated toilet seat or a handicapped toilet? No   TIMED UP AND GO:  Was the test performed? Yes .  Length of time to ambulate 10 feet: 9 sec.   Gait steady and fast without use of assistive device  Cognitive Function:Normal cognitive status assessed by direct observation by this Nurse Health Advisor. No abnormalities found.   MMSE - Mini Mental State Exam 09/26/2017  Orientation to time 5  Orientation to Place 5  Registration 3  Attention/ Calculation 5  Recall 3  Language- name 2 objects 2  Language- repeat 1  Language- follow 3 step command 3  Language- read & follow direction 1  Write a sentence 1  Copy design 1  Total score 30        Immunizations Immunization History  Administered Date(s) Administered  . Fluad Quad(high Dose 65+) 11/10/2018, 12/28/2019  . Influenza, High Dose Seasonal PF 01/11/2018  . Influenza,inj,Quad PF,6+ Mos 12/23/2014, 10/24/2015, 11/14/2016  . Moderna Sars-Covid-2 Vaccination 04/11/2019, 05/10/2019, 03/03/2020  . Pneumococcal Conjugate-13 09/09/2013  . Pneumococcal Polysaccharide-23 12/20/2011  . Tdap 07/28/2005    TDAP status: Due,  Education has been provided regarding the importance of this vaccine. Advised may receive this vaccine at local pharmacy or Health Dept. Aware to provide a copy of the vaccination record if obtained from local pharmacy or Health Dept. Verbalized acceptance and understanding.  Flu Vaccine status: Up to date  Pneumococcal vaccine status: Up to date  Covid-19 vaccine status: Completed vaccines  Qualifies for Shingles Vaccine? Yes   Zostavax completed No   Shingrix Completed?: No.    Education has been provided regarding the importance of this vaccine. Patient has been advised to call insurance company to determine out of pocket expense if they have not yet received this vaccine. Advised may also receive vaccine at local pharmacy or Health Dept. Verbalized acceptance and understanding.  Screening Tests Health Maintenance  Topic Date Due  . Hepatitis C Screening  Never done  . TETANUS/TDAP  07/29/2015  . COLONOSCOPY (Pts 45-11yrs Insurance coverage will need to be confirmed)  02/08/2022  . MAMMOGRAM  04/19/2022  . INFLUENZA VACCINE  Completed  . DEXA SCAN  Completed  . COVID-19 Vaccine  Completed  . PNA vac Low Risk Adult  Completed    Health Maintenance  Health Maintenance Due  Topic Date Due  . Hepatitis C Screening  Never done  . TETANUS/TDAP  07/29/2015    Colorectal cancer screening: Type of screening: Colonoscopy. Completed 02/09/2012. Repeat every 10 years  Mammogram status: Completed Bilateral 04/19/2020. Repeat every year  Bone Density status: Due-Patient will call back when she is  ready to schedule.  Lung Cancer Screening: (Low Dose CT Chest recommended if Age 30-80 years, 30 pack-year currently smoking OR have quit w/in 15years.) does not qualify.    Additional Screening:  Hepatitis C Screening: does qualify; Patient to discuss with PCP at next office visit  Vision Screening: Recommended annual ophthalmology exams for early detection of glaucoma and other disorders  of the eye. Is the patient up to date with their annual eye exam?  Yes  Who is the provider or what is the name of the office in which the patient attends annual eye exams? Dr. Prudencio Burly   Dental Screening: Recommended annual dental exams for proper oral hygiene  Community Resource Referral / Chronic Care Management: CRR required this visit?  No   CCM required this visit?  No      Plan:     I have personally reviewed and noted the following in the patient's chart:   . Medical and social history . Use of alcohol, tobacco or illicit drugs  . Current medications and supplements . Functional ability and status . Nutritional status . Physical activity . Advanced directives . List of other physicians . Hospitalizations, surgeries, and ER visits in previous 12 months . Vitals . Screenings to include cognitive, depression, and falls . Referrals and appointments  In addition, I have reviewed and discussed with patient certain preventive protocols, quality metrics, and best practice recommendations. A written personalized care plan for preventive services as well as general preventive health recommendations were provided to patient.     Marta Antu, LPN   03/08/7251  Nurse Health Advisor  Nurse Notes: None

## 2020-05-03 ENCOUNTER — Other Ambulatory Visit: Payer: Self-pay

## 2020-05-03 ENCOUNTER — Ambulatory Visit (INDEPENDENT_AMBULATORY_CARE_PROVIDER_SITE_OTHER): Payer: Medicare HMO

## 2020-05-03 VITALS — BP 126/64 | HR 85 | Temp 98.6°F | Resp 16 | Ht 63.0 in | Wt 179.4 lb

## 2020-05-03 DIAGNOSIS — Z0000 Encounter for general adult medical examination without abnormal findings: Secondary | ICD-10-CM | POA: Diagnosis not present

## 2020-05-03 NOTE — Patient Instructions (Signed)
Andrea Byrd , Thank you for taking time to come for your Medicare Wellness Visit. I appreciate your ongoing commitment to your health goals. Please review the following plan we discussed and let me know if I can assist you in the future.   Screening recommendations/referrals: Colonoscopy: Completed 12/6/2-13-Due 02/08/2022 Mammogram: Completed 04/19/2020-Due 04/19/2021 Bone Density: Due-Please call the office when you are ready to schedule. Recommended yearly ophthalmology/optometry visit for glaucoma screening and checkup Recommended yearly dental visit for hygiene and checkup  Vaccinations: Influenza vaccine: Up to date Pneumococcal vaccine: Completed vaccines Tdap vaccine: Discuss with pharmacy Shingles vaccine: Discuss with pharmacy Covid-19:Completed vacccines  Advanced directives: Please bring a copy for your chart  Conditions/risks identified: See problem list  Next appointment: Follow up in one year for your annual wellness visit 05/09/2021 @ 8:15   Preventive Care 74 Years and Older, Female Preventive care refers to lifestyle choices and visits with your health care provider that can promote health and wellness. What does preventive care include?  A yearly physical exam. This is also called an annual well check.  Dental exams once or twice a year.  Routine eye exams. Ask your health care provider how often you should have your eyes checked.  Personal lifestyle choices, including:  Daily care of your teeth and gums.  Regular physical activity.  Eating a healthy diet.  Avoiding tobacco and drug use.  Limiting alcohol use.  Practicing safe sex.  Taking low-dose aspirin every day.  Taking vitamin and mineral supplements as recommended by your health care provider. What happens during an annual well check? The services and screenings done by your health care provider during your annual well check will depend on your age, overall health, lifestyle risk factors, and  family history of disease. Counseling  Your health care provider may ask you questions about your:  Alcohol use.  Tobacco use.  Drug use.  Emotional well-being.  Home and relationship well-being.  Sexual activity.  Eating habits.  History of falls.  Memory and ability to understand (cognition).  Work and work Statistician.  Reproductive health. Screening  You may have the following tests or measurements:  Height, weight, and BMI.  Blood pressure.  Lipid and cholesterol levels. These may be checked every 5 years, or more frequently if you are over 1 years old.  Skin check.  Lung cancer screening. You may have this screening every year starting at age 74 if you have a 30-pack-year history of smoking and currently smoke or have quit within the past 15 years.  Fecal occult blood test (FOBT) of the stool. You may have this test every year starting at age 74.  Flexible sigmoidoscopy or colonoscopy. You may have a sigmoidoscopy every 5 years or a colonoscopy every 10 years starting at age 74.  Hepatitis C blood test.  Hepatitis B blood test.  Sexually transmitted disease (STD) testing.  Diabetes screening. This is done by checking your blood sugar (glucose) after you have not eaten for a while (fasting). You may have this done every 1-3 years.  Bone density scan. This is done to screen for osteoporosis. You may have this done starting at age 74.  Mammogram. This may be done every 1-2 years. Talk to your health care provider about how often you should have regular mammograms. Talk with your health care provider about your test results, treatment options, and if necessary, the need for more tests. Vaccines  Your health care provider may recommend certain vaccines, such as:  Influenza vaccine.  This is recommended every year.  Tetanus, diphtheria, and acellular pertussis (Tdap, Td) vaccine. You may need a Td booster every 10 years.  Zoster vaccine. You may need this  after age 74.  Pneumococcal 13-valent conjugate (PCV13) vaccine. One dose is recommended after age 74.  Pneumococcal polysaccharide (PPSV23) vaccine. One dose is recommended after age 74. Talk to your health care provider about which screenings and vaccines you need and how often you need them. This information is not intended to replace advice given to you by your health care provider. Make sure you discuss any questions you have with your health care provider. Document Released: 03/19/2015 Document Revised: 11/10/2015 Document Reviewed: 12/22/2014 Elsevier Interactive Patient Education  2017 [place] Prevention in the Home Falls can cause injuries. They can happen to people of all ages. There are many things you can do to make your home safe and to help prevent falls. What can I do on the outside of my home?  Regularly fix the edges of walkways and driveways and fix any cracks.  Remove anything that might make you trip as you walk through a door, such as a raised step or threshold.  Trim any bushes or trees on the path to your home.  Use bright outdoor lighting.  Clear any walking paths of anything that might make someone trip, such as rocks or tools.  Regularly check to see if handrails are loose or broken. Make sure that both sides of any steps have handrails.  Any raised decks and porches should have guardrails on the edges.  Have any leaves, snow, or ice cleared regularly.  Use sand or salt on walking paths during winter.  Clean up any spills in your garage right away. This includes oil or grease spills. What can I do in the bathroom?  Use night lights.  Install grab bars by the toilet and in the tub and shower. Do not use towel bars as grab bars.  Use non-skid mats or decals in the tub or shower.  If you need to sit down in the shower, use a plastic, non-slip stool.  Keep the floor dry. Clean up any water that spills on the floor as soon as it  happens.  Remove soap buildup in the tub or shower regularly.  Attach bath mats securely with double-sided non-slip rug tape.  Do not have throw rugs and other things on the floor that can make you trip. What can I do in the bedroom?  Use night lights.  Make sure that you have a light by your bed that is easy to reach.  Do not use any sheets or blankets that are too big for your bed. They should not hang down onto the floor.  Have a firm chair that has side arms. You can use this for support while you get dressed.  Do not have throw rugs and other things on the floor that can make you trip. What can I do in the kitchen?  Clean up any spills right away.  Avoid walking on wet floors.  Keep items that you use a lot in easy-to-reach places.  If you need to reach something above you, use a strong step stool that has a grab bar.  Keep electrical cords out of the way.  Do not use floor polish or wax that makes floors slippery. If you must use wax, use non-skid floor wax.  Do not have throw rugs and other things on the floor that can make  you trip. What can I do with my stairs?  Do not leave any items on the stairs.  Make sure that there are handrails on both sides of the stairs and use them. Fix handrails that are broken or loose. Make sure that handrails are as long as the stairways.  Check any carpeting to make sure that it is firmly attached to the stairs. Fix any carpet that is loose or worn.  Avoid having throw rugs at the top or bottom of the stairs. If you do have throw rugs, attach them to the floor with carpet tape.  Make sure that you have a light switch at the top of the stairs and the bottom of the stairs. If you do not have them, ask someone to add them for you. What else can I do to help prevent falls?  Wear shoes that:  Do not have high heels.  Have rubber bottoms.  Are comfortable and fit you well.  Are closed at the toe. Do not wear sandals.  If you  use a stepladder:  Make sure that it is fully opened. Do not climb a closed stepladder.  Make sure that both sides of the stepladder are locked into place.  Ask someone to hold it for you, if possible.  Clearly mark and make sure that you can see:  Any grab bars or handrails.  First and last steps.  Where the edge of each step is.  Use tools that help you move around (mobility aids) if they are needed. These include:  Canes.  Walkers.  Scooters.  Crutches.  Turn on the lights when you go into a dark area. Replace any light bulbs as soon as they burn out.  Set up your furniture so you have a clear path. Avoid moving your furniture around.  If any of your floors are uneven, fix them.  If there are any pets around you, be aware of where they are.  Review your medicines with your doctor. Some medicines can make you feel dizzy. This can increase your chance of falling. Ask your doctor what other things that you can do to help prevent falls. This information is not intended to replace advice given to you by your health care provider. Make sure you discuss any questions you have with your health care provider. Document Released: 12/17/2008 Document Revised: 07/29/2015 Document Reviewed: 03/27/2014 Elsevier Interactive Patient Education  2017 [employer].

## 2020-05-13 ENCOUNTER — Ambulatory Visit (INDEPENDENT_AMBULATORY_CARE_PROVIDER_SITE_OTHER): Payer: Medicare HMO | Admitting: Family Medicine

## 2020-05-13 ENCOUNTER — Other Ambulatory Visit: Payer: Self-pay

## 2020-05-13 ENCOUNTER — Encounter: Payer: Self-pay | Admitting: Family Medicine

## 2020-05-13 VITALS — BP 118/70 | HR 74 | Temp 98.7°F | Resp 18 | Ht 63.0 in | Wt 178.0 lb

## 2020-05-13 DIAGNOSIS — L03119 Cellulitis of unspecified part of limb: Secondary | ICD-10-CM

## 2020-05-13 DIAGNOSIS — G2581 Restless legs syndrome: Secondary | ICD-10-CM

## 2020-05-13 DIAGNOSIS — E669 Obesity, unspecified: Secondary | ICD-10-CM | POA: Diagnosis not present

## 2020-05-13 DIAGNOSIS — I10 Essential (primary) hypertension: Secondary | ICD-10-CM

## 2020-05-13 LAB — BASIC METABOLIC PANEL
BUN: 13 mg/dL (ref 6–23)
CO2: 33 mEq/L — ABNORMAL HIGH (ref 19–32)
Calcium: 9.7 mg/dL (ref 8.4–10.5)
Chloride: 99 mEq/L (ref 96–112)
Creatinine, Ser: 0.83 mg/dL (ref 0.40–1.20)
GFR: 69.61 mL/min (ref >60.00)
Glucose, Bld: 82 mg/dL (ref 70–99)
Potassium: 3.2 mEq/L — ABNORMAL LOW (ref 3.5–5.1)
Sodium: 140 mEq/L (ref 135–145)

## 2020-05-13 LAB — LIPID PANEL
Cholesterol: 137 mg/dL (ref 0–200)
HDL: 41.8 mg/dL (ref >39.00)
LDL Cholesterol: 82 mg/dL (ref 0–99)
NonHDL: 95.2
Total CHOL/HDL Ratio: 3
Triglycerides: 66 mg/dL (ref 0.0–149.0)
VLDL: 13.2 mg/dL (ref 0.0–40.0)

## 2020-05-13 LAB — CBC WITH DIFFERENTIAL/PLATELET
Basophils Absolute: 0.1 10*3/uL (ref 0.0–0.1)
Basophils Relative: 1 % (ref 0.0–3.0)
Eosinophils Absolute: 0.2 10*3/uL (ref 0.0–0.7)
Eosinophils Relative: 3.6 % (ref 0.0–5.0)
HCT: 36.7 % (ref 36.0–46.0)
Hemoglobin: 12.3 g/dL (ref 12.0–15.0)
Lymphocytes Relative: 23.2 % (ref 12.0–46.0)
Lymphs Abs: 1.5 10*3/uL (ref 0.7–4.0)
MCHC: 33.5 g/dL (ref 30.0–36.0)
MCV: 87 fl (ref 78.0–100.0)
Monocytes Absolute: 0.4 10*3/uL (ref 0.1–1.0)
Monocytes Relative: 6 % (ref 3.0–12.0)
Neutro Abs: 4.2 10*3/uL (ref 1.4–7.7)
Neutrophils Relative %: 66.2 % (ref 43.0–77.0)
Platelets: 268 10*3/uL (ref 150.0–400.0)
RBC: 4.22 Mil/uL (ref 3.87–5.11)
RDW: 14.3 % (ref 11.5–15.5)
WBC: 6.3 10*3/uL (ref 4.0–10.5)

## 2020-05-13 LAB — HEPATIC FUNCTION PANEL
ALT: 36 U/L — ABNORMAL HIGH (ref 0–35)
AST: 39 U/L — ABNORMAL HIGH (ref 0–37)
Albumin: 3.9 g/dL (ref 3.5–5.2)
Alkaline Phosphatase: 103 U/L (ref 39–117)
Bilirubin, Direct: 0.1 mg/dL (ref 0.0–0.3)
Total Bilirubin: 0.5 mg/dL (ref 0.2–1.2)
Total Protein: 6.9 g/dL (ref 6.0–8.3)

## 2020-05-13 LAB — VITAMIN D 25 HYDROXY (VIT D DEFICIENCY, FRACTURES): VITD: 24.73 ng/mL — ABNORMAL LOW (ref 30.00–100.00)

## 2020-05-13 LAB — TSH: TSH: 1.18 u[IU]/mL (ref 0.35–4.50)

## 2020-05-13 MED ORDER — DOXYCYCLINE HYCLATE 100 MG PO TABS
100.0000 mg | ORAL_TABLET | Freq: Two times a day (BID) | ORAL | 0 refills | Status: DC
Start: 2020-05-13 — End: 2021-02-18

## 2020-05-13 NOTE — Patient Instructions (Signed)
Schedule your complete physical in 6 months We'll notify you of your lab results and make any changes if needed Keep up the good work on healthy diet and regular exercise- you're down 7 lbs!!! START the Doxycycline twice daily- w/ food Epsom soaks, pat dry, and then moisturize Call with any questions or concerns [place]!!!!

## 2020-05-13 NOTE — Progress Notes (Signed)
   Subjective:    Patient ID: Andrea Byrd, female    DOB: Sep 01, 1945, 75 y.o.   MRN: [id number]  HPI HTN- chronic problem, on Losartan HCTZ 50/12.5mg  daily w/ good control.  Denies CP, SOB, HAs, visual changes.  Some intermittent swelling of feet.  Swelling is not uncomfortable.  Obesity- pt is down 7 lbs since CPE in September.  Pt has been very busy as she just moved recently.  RLS- pt reports sxs are adequately controlled as long as she takes the medication (Mirapex 1mg  BID and Gabapentin 600mg  BID).  Able to sleep at night.  R foot infection- pt developed a fissure in R great toe 1 week ago.  Now is painful, red, and warm to touch.  Since then, 3rd toenail has fallen off and is oozing   Review of Systems For ROS see HPI   This visit occurred during the SARS-CoV-2 public health emergency.  Safety protocols were in place, including screening questions prior to the visit, additional usage of staff PPE, and extensive cleaning of exam room while observing appropriate contact time as indicated for disinfecting solutions.       Objective:   Physical Exam Vitals reviewed.  Constitutional:      General: She is not in acute distress.    Appearance: Normal appearance. She is well-developed. She is not ill-appearing.  HENT:     Head: Normocephalic and atraumatic.  Eyes:     Conjunctiva/sclera: Conjunctivae normal.     Pupils: Pupils are equal, round, and reactive to light.  Neck:     Thyroid: No thyromegaly.  Cardiovascular:     Rate and Rhythm: Normal rate and regular rhythm.     Heart sounds: Normal heart sounds. No murmur heard.   Pulmonary:     Effort: Pulmonary effort is normal. No respiratory distress.     Breath sounds: Normal breath sounds.  Abdominal:     General: There is no distension.     Palpations: Abdomen is soft.     Tenderness: There is no abdominal tenderness.  Musculoskeletal:     Cervical back: Normal range of motion and neck supple.     Right lower leg: No  edema.     Left lower leg: No edema.  Lymphadenopathy:     Cervical: No cervical adenopathy.  Skin:    General: Skin is warm and dry.     Findings: Erythema and lesion (fissure along medial edge of R big toe w/ surrounding redness.  3rd toenail has fallen off and nailbed is oozing) present.  Neurological:     Mental Status: She is alert and oriented to person, place, and time.  Psychiatric:        Behavior: Behavior normal.           Assessment & Plan:  Cellulitis R foot- new.  Start Doxy.  Reviewed supportive care and red flags that should prompt return.  Pt expressed understanding and is in agreement w/ plan.

## 2020-05-13 NOTE — Assessment & Plan Note (Signed)
Ongoing issue.  sxs are well controlled on Mirapex and Gabapentin.  No changes at this time

## 2020-05-13 NOTE — Assessment & Plan Note (Signed)
Pt is down 7 lbs since last visit.  Applauded her efforts.  Will continue to follow.

## 2020-05-13 NOTE — Assessment & Plan Note (Signed)
Chronic problem.  On Losartan HCTZ 50/12.5mg  daily w/ good control.  Currently asymptomatic.  Check labs.  No anticipated med changes.

## 2020-05-14 ENCOUNTER — Other Ambulatory Visit: Payer: Self-pay

## 2020-05-14 DIAGNOSIS — E559 Vitamin D deficiency, unspecified: Secondary | ICD-10-CM

## 2020-05-14 DIAGNOSIS — E876 Hypokalemia: Secondary | ICD-10-CM

## 2020-05-14 MED ORDER — VITAMIN D (ERGOCALCIFEROL) 1.25 MG (50000 UNIT) PO CAPS: 50000.0000 [IU] | ORAL_CAPSULE | ORAL | 0 refills | Status: DC

## 2020-05-14 MED ORDER — POTASSIUM CHLORIDE CRYS ER 20 MEQ PO TBCR: 20.0000 meq | EXTENDED_RELEASE_TABLET | Freq: Every day | ORAL | 1 refills | Status: DC

## 2020-05-17 NOTE — Progress Notes (Signed)
Called patient about labs. Pt did not answer no have a vm to leave a message. Patient labs have been mailed to patient home address.

## 2020-05-19 ENCOUNTER — Other Ambulatory Visit: Payer: Self-pay | Admitting: *Deleted

## 2020-05-19 ENCOUNTER — Telehealth: Payer: Self-pay | Admitting: Family Medicine

## 2020-05-19 DIAGNOSIS — M2041 Other hammer toe(s) (acquired), right foot: Secondary | ICD-10-CM

## 2020-05-19 NOTE — Progress Notes (Signed)
Pt requesting referral for [facility] for Hammer toes on right foot. Okay per Dr. Birdie Riddle. Referral placed.

## 2020-05-19 NOTE — Telephone Encounter (Signed)
Spoke to pt asked her how can I help you? Pt said calling about lab results. Told her okay, Labs look good w/ the exception of low Vit D and low potassium. Based on this, we need to start 50,000 units weekly x12 weeks in addition to daily OTC supplement of at least 2000 units.  We also need to start [place] 51meq daily, (#90, 1 refill) to improve your potassium. Told pt both Rx's have been sent to pharmacy but you may want to call first to let them know that you will be coming by to pickup because they probably put them back. Pt verbalized understanding. We will repeat a BMP at a lab only visit in 2 weeks (dx hypokalemia) after staring the potassium. Need to call office and schedule lab appt only. Pt verbalized understanding.

## 2020-05-19 NOTE — Telephone Encounter (Signed)
Pt returned phone call back about lab results, she also states that a referral to [facility] was to be placed for her.  Please advise

## 2020-05-24 DIAGNOSIS — H401232 Low-tension glaucoma, bilateral, moderate stage: Secondary | ICD-10-CM | POA: Diagnosis not present

## 2020-05-27 ENCOUNTER — Ambulatory Visit: Payer: Medicare HMO | Admitting: Podiatry

## 2020-05-27 ENCOUNTER — Other Ambulatory Visit: Payer: Self-pay

## 2020-05-27 ENCOUNTER — Ambulatory Visit (INDEPENDENT_AMBULATORY_CARE_PROVIDER_SITE_OTHER): Payer: Medicare HMO

## 2020-05-27 DIAGNOSIS — M2041 Other hammer toe(s) (acquired), right foot: Secondary | ICD-10-CM | POA: Diagnosis not present

## 2020-05-27 DIAGNOSIS — M2011 Hallux valgus (acquired), right foot: Secondary | ICD-10-CM | POA: Diagnosis not present

## 2020-05-27 DIAGNOSIS — L84 Corns and callosities: Secondary | ICD-10-CM

## 2020-05-27 DIAGNOSIS — M21621 Bunionette of right foot: Secondary | ICD-10-CM

## 2020-05-27 DIAGNOSIS — R234 Changes in skin texture: Secondary | ICD-10-CM

## 2020-05-27 DIAGNOSIS — M21611 Bunion of right foot: Secondary | ICD-10-CM | POA: Diagnosis not present

## 2020-05-27 MED ORDER — MUPIROCIN 2 % EX OINT
1.0000 | TOPICAL_OINTMENT | Freq: Two times a day (BID) | CUTANEOUS | 2 refills | Status: DC
Start: 2020-05-27 — End: 2021-02-18

## 2020-05-27 NOTE — Progress Notes (Signed)
  Subjective:  Patient ID: Andrea Byrd, female    DOB: Jul 10, 1945,  MRN: [id number]  Chief Complaint  Patient presents with  . Toe Pain    Right hallux has a split in the skin that will try to open up and then close back she has a place on the 2nd toe in between the toe. She has been soaking it in epsom salt and is on Doxy    75 y.o. female presents with the above complaint. History confirmed with patient.   Objective:  Physical Exam: warm, good capillary refill, no trophic changes or ulcerative lesions, normal DP and PT pulses and normal sensory exam.   Right Foot: Moderate hallux valgus deformity with bunion formation, semirigid hammertoes 2 through 5, the worst of which is a second, the medial hallux has a fissure with the medial pinch callusing, no signs of infection or deep penetration, there is also blistering of the tip of the second toe with heloma molle and callus formation    Radiographs: X-ray of the right foot: Moderate hallux valgus deformity with hammertoes appears to have previous toe fracture  Assessment:   1. Hallux valgus with bunions, right   2. Hammertoe of right foot      Plan:  Patient was evaluated and treated and all questions answered.  Discussed the etiology and treatment including surgical and non surgical treatment for painful bunions and hammertoes. Also discussed with her that this is likely the primary reason she has the hyperkeratotic lesions.  She has exhausted all non surgical treatment prior to this visit including shoe gear changes and padding.  She is considering surgical intervention. We discussed all risks including but not limited to: pain, swelling, infection, scar, numbness which may be temporary or permanent, chronic pain, stiffness, nerve pain or damage, wound healing problems, bone healing problems including delayed or non-union and recurrence. Specifically we discussed the following procedures: Minimally invasive bunionectomy with distal  metatarsal metaphyseal osteotomies, tailor's bunion correction and hammertoe correction 2 through 5.  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  Today I dispensed silicone offloading pads to alleviate the hyperkeratotic lesions that are painful.  Also gave her prescription for mupirocin to apply to the fissure so that it will alleviate this   Return if symptoms worsen or fail to improve, for to plan bunion surgery when ready.

## 2020-06-07 ENCOUNTER — Other Ambulatory Visit: Payer: Self-pay

## 2020-06-07 ENCOUNTER — Other Ambulatory Visit (INDEPENDENT_AMBULATORY_CARE_PROVIDER_SITE_OTHER): Payer: Medicare HMO

## 2020-06-07 DIAGNOSIS — E876 Hypokalemia: Secondary | ICD-10-CM | POA: Diagnosis not present

## 2020-06-07 LAB — BASIC METABOLIC PANEL
BUN: 13 mg/dL (ref 6–23)
CO2: 33 mEq/L — ABNORMAL HIGH (ref 19–32)
Calcium: 9.9 mg/dL (ref 8.4–10.5)
Chloride: 102 mEq/L (ref 96–112)
Creatinine, Ser: 0.79 mg/dL (ref 0.40–1.20)
GFR: 73.82 mL/min (ref >60.00)
Glucose, Bld: 91 mg/dL (ref 70–99)
Potassium: 4.3 mEq/L (ref 3.5–5.1)
Sodium: 140 mEq/L (ref 135–145)

## 2020-06-11 ENCOUNTER — Other Ambulatory Visit: Payer: Self-pay | Admitting: Family Medicine

## 2020-06-12 ENCOUNTER — Other Ambulatory Visit: Payer: Self-pay | Admitting: Family Medicine

## 2020-06-23 ENCOUNTER — Other Ambulatory Visit: Payer: Self-pay | Admitting: Family Medicine

## 2020-06-23 NOTE — Telephone Encounter (Signed)
Patient is requesting a refill of the following medications: Requested Prescriptions   Pending Prescriptions Disp Refills  . tiZANidine (ZANAFLEX) 4 MG tablet [Pharmacy Med Name: TIZANIDINE HCL 4 MG TABLET] 45 tablet 1    Sig: TAKE 1 TABLET (4 MG TOTAL) BY MOUTH 3 (THREE) TIMES DAILY AS NEEDED FOR MUSCLE SPASMS.    Date of patient request:06/23/2020 Last office visit: 05/13/2020 Date of last refill: 06/14/2020 Last refill amount: 45 tablets 1 refill Follow up time period per chart: 10/15/2020

## 2020-06-23 NOTE — Telephone Encounter (Signed)
This was filled last week- 45 tablets w/ 1 refill.  No refills at this time

## 2020-07-09 ENCOUNTER — Telehealth: Payer: Self-pay

## 2020-07-09 NOTE — Chronic Care Management (AMB) (Signed)
Chronic Care Management Pharmacy Assistant   Name: Andrea Byrd  MRN: [id number] DOB: 1945/12/14  Reason for Encounter: Hypertension Disease State Call   Recent office visits:  05/13/20- [name], MD- seen for hypertension,short course doxycycline x7DS, labs ordered Per lab follow up orders: started VitaminD < 5000U weekly and 2000U  OTC supplement daily, started Kdur 66meq daily, referral to podiatry, labs repeat 2 wks  Recent consult visits:  05/27/20- Criselda Peaches, DPM (Podiatry)- seen for toe pain, started mupirocin 2% topical bid, follow up as needed  Hospital visits:  None in previous 6 months  Medications: Outpatient Encounter Medications as of 07/09/2020  Medication Sig  . albuterol (VENTOLIN HFA) 108 (90 Base) MCG/ACT inhaler TAKE 2 PUFFS BY MOUTH EVERY 6 HOURS AS NEEDED FOR WHEEZE OR SHORTNESS OF BREATH  . aspirin EC 81 MG tablet Take 1 tablet (81 mg total) by mouth daily.  Marland Kitchen doxycycline (VIBRA-TABS) 100 MG tablet Take 1 tablet (100 mg total) by mouth 2 (two) times daily.  . DULoxetine (CYMBALTA) 20 MG capsule Take 20 mg by mouth daily. 2 daily  . furosemide (LASIX) 20 MG tablet Take 1 tablet (20 mg total) by mouth daily.  Marland Kitchen gabapentin (NEURONTIN) 300 MG capsule TAKE 2 CAPSULES (600 MG TOTAL) BY MOUTH 2 (TWO) TIMES DAILY.  Marland Kitchen latanoprost (XALATAN) 0.005 % ophthalmic solution Place 1 drop into both eyes at bedtime.   Marland Kitchen losartan-hydrochlorothiazide (HYZAAR) 50-12.5 MG tablet TAKE 1 TABLET BY MOUTH EVERY DAY  . mupirocin ointment (BACTROBAN) 2 % Apply 1 application topically 2 (two) times daily.  . potassium chloride SA (KLOR-CON) 20 MEQ tablet Take 1 tablet (20 mEq total) by mouth daily.  . pramipexole (MIRAPEX) 1 MG tablet TAKE 1 TABLET BY MOUTH 2 TIMES DAILY  . tiZANidine (ZANAFLEX) 4 MG tablet TAKE 1 TABLET (4 MG TOTAL) BY MOUTH 3 (THREE) TIMES DAILY AS NEEDED FOR MUSCLE SPASMS.  . Vitamin D, Ergocalciferol, (DRISDOL) 1.25 MG (50000 UNIT) CAPS capsule Take 1  capsule (50,000 Units total) by mouth every 7 (seven) days.   No facility-administered encounter medications on file as of 07/09/2020.   Reviewed chart prior to disease state call. Spoke with patient regarding BP  Recent Office Vitals: BP Readings from Last 3 Encounters:  05/13/20 118/70  05/03/20 126/64  11/14/19 121/78   Pulse Readings from Last 3 Encounters:  05/13/20 74  05/03/20 85  11/14/19 80    Wt Readings from Last 3 Encounters:  05/13/20 178 lb (80.7 kg)  05/03/20 179 lb 6.4 oz (81.4 kg)  11/14/19 185 lb 8 oz (84.1 kg)     Kidney Function Lab Results  Component Value Date/Time   CREATININE 0.79 06/07/2020 08:49 AM   CREATININE 0.83 05/13/2020 08:56 AM   CREATININE 0.88 08/06/2017 04:51 PM   GFR 73.82 06/07/2020 08:49 AM   GFRNONAA >60 10/05/2017 03:35 PM   GFRAA >60 10/05/2017 03:35 PM    BMP Latest Ref Rng & Units 06/07/2020 05/13/2020 11/14/2019  Glucose 70 - 99 mg/dL 91 82 93  BUN 6 - 23 mg/dL 13 13 10   Creatinine 0.40 - 1.20 mg/dL 0.79 0.83 0.69  BUN/Creat Ratio 6 - 22 (calc) - - -  Sodium 135 - 145 mEq/L 140 140 142  Potassium 3.5 - 5.1 mEq/L 4.3 3.2(L) 3.9  Chloride 96 - 112 mEq/L 102 99 107  CO2 19 - 32 mEq/L 33(H) 33(H) 29  Calcium 8.4 - 10.5 mg/dL 9.9 9.7 9.0   Spoke with Andrea Byrd and she is doing well overall. She did express that her toe has been slow to heal since starting on the antibiotic ointment. She stated it looks better than it was before, but she isn't sure if it should have progressed more by now.  Current antihypertensive regimen:  Losartan-hctz 50-12.5 mg daily  How often are you checking your Blood Pressure? Patient stated she checks her BP 3-4 times weekly  Current home BP readings: 100-110/60  What recent interventions/DTPs have been made by any provider to improve Blood Pressure control since last CPP Visit:  05/13/20- [name], MD started VitaminD < 5000U weekly and 2000U  OTC supplement daily, started Kdur 53meq  daily  Any recent hospitalizations or ED visits since last visit with CPP? No  What diet changes have been made to improve Blood Pressure Control?  Patient did not note any changes in diet   What exercise is being done to improve your Blood Pressure Control?  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 He has had 7 strokes and needs consistent care. Andrea Byrd does try to do some things when time allows. She recently planted flowers in her garden and moved.  Adherence Review: Is the patient currently on ACE/ARB medication? Yes Does the patient have >5 day gap between last estimated fill dates? No  Star Rating Drugs: Losartan-hctz 50-12.5 mg- 90 DS last filled 06/11/20  [facility] CPA, CMA

## 2020-07-16 ENCOUNTER — Other Ambulatory Visit: Payer: Self-pay | Admitting: Family Medicine

## 2020-07-16 NOTE — Telephone Encounter (Signed)
LFD 06/14/20 #45with 1 refill LOV 05/13/20 NOV 11/19/20

## 2020-08-01 ENCOUNTER — Other Ambulatory Visit: Payer: Self-pay | Admitting: Family Medicine

## 2020-09-01 ENCOUNTER — Encounter: Payer: Self-pay | Admitting: *Deleted

## 2020-09-02 ENCOUNTER — Other Ambulatory Visit: Payer: Self-pay | Admitting: Family Medicine

## 2020-09-02 DIAGNOSIS — G2581 Restless legs syndrome: Secondary | ICD-10-CM

## 2020-09-16 ENCOUNTER — Other Ambulatory Visit: Payer: Self-pay | Admitting: Family Medicine

## 2020-10-05 ENCOUNTER — Telehealth: Payer: Self-pay

## 2020-10-05 ENCOUNTER — Other Ambulatory Visit: Payer: Self-pay | Admitting: Family Medicine

## 2020-10-05 NOTE — Chronic Care Management (AMB) (Signed)
Chronic Care Management Pharmacy Assistant   Name: GIULLIANA MCCLENNY  MRN: [id number] DOB: [birth year]   Reason for Encounter: Disease State - Hypertension    Recent office visits:  None noted.   Recent consult visits:  None noted.   Hospital visits:  None in previous 6 months  Medications: Outpatient Encounter Medications as of 10/05/2020  Medication Sig   albuterol (VENTOLIN HFA) 108 (90 Base) MCG/ACT inhaler TAKE 2 PUFFS BY MOUTH EVERY 6 HOURS AS NEEDED FOR WHEEZE OR SHORTNESS OF BREATH   aspirin EC 81 MG tablet Take 1 tablet (81 mg total) by mouth daily.   doxycycline (VIBRA-TABS) 100 MG tablet Take 1 tablet (100 mg total) by mouth 2 (two) times daily.   DULoxetine (CYMBALTA) 20 MG capsule Take 20 mg by mouth daily. 2 daily   furosemide (LASIX) 20 MG tablet Take 1 tablet (20 mg total) by mouth daily.   gabapentin (NEURONTIN) 300 MG capsule TAKE 2 CAPSULES BY MOUTH 2 TIMES DAILY.   latanoprost (XALATAN) 0.005 % ophthalmic solution Place 1 drop into both eyes at bedtime.    losartan-hydrochlorothiazide (HYZAAR) 50-12.5 MG tablet TAKE 1 TABLET BY MOUTH EVERY DAY   mupirocin ointment (BACTROBAN) 2 % Apply 1 application topically 2 (two) times daily.   potassium chloride SA (KLOR-CON) 20 MEQ tablet Take 1 tablet (20 mEq total) by mouth daily.   pramipexole (MIRAPEX) 1 MG tablet TAKE 1 TABLET BY MOUTH TWICE A DAY   tiZANidine (ZANAFLEX) 4 MG tablet TAKE 1 TABLET (4 MG TOTAL) BY MOUTH 3 (THREE) TIMES DAILY AS NEEDED FOR MUSCLE SPASMS.   Vitamin D, Ergocalciferol, (DRISDOL) 1.25 MG (50000 UNIT) CAPS capsule Take 1 capsule (50,000 Units total) by mouth every 7 (seven) days.   No facility-administered encounter medications on file as of 10/05/2020.    Current antihypertensive regimen:  Losartan-hctz 50-12.5 mg 1 tablet daily  How often are you checking your Blood Pressure?  She reported she has been trying to check daily but the last week she has not been checking as well due to being  under a lot of stress. She reported her blood pressure has been all over the place. 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 Her other brother in law is having some memory issues as well.    Current home BP readings: 09/30/20 = 170/81, 10/01/20 = 155/90, 10/05/20  = 136/78, 10/07/20 = 156/88   What recent interventions/DTPs have been made by any provider to improve Blood Pressure control since last CPP Visit:  Patient reports no medication changes but her current family and social stressor have Increased which she feels is the cause of her spike in blood pressures. She denies any chest pain or acute symptoms at this time.    Any recent hospitalizations or ED visits since last visit with CPP?   Patient has not has any hospitalizations or ED visits since last visit with CPP.  What diet changes have been made to improve Blood Pressure Control?  Patient reported she does follow a low sodium diet.   What exercise is being done to improve your Blood Pressure Control?  Patient reported she is very active being a caregiver and her recent family stressor but does not have a lot of time for herself to exercise.     Adherence Review: Is the patient currently on ACE/ARB medication? Yes Does the patient have >5 day gap between last estimated fill  dates? No  Losartan-hctz 50-12.5 mg daily - last filled 09/03/20 90 days   Star Rating Drugs: Losartan-hctz 50-12.5 mg daily - last filled 09/03/20 90 days    Rescheduled her 8/12 appointment with Madelin Rear, CPP to 10/20/20 @ 8:30am patient confirmed new appointment date and time.   Jobe Gibbon, [facility] Pharmacist Assistant  [phone]   Time Spent: 30 minutes

## 2020-10-05 NOTE — Telephone Encounter (Signed)
LFD 09/16/20 #45 with 1 refill LOV 05/13/20 NOV 11/19/20

## 2020-10-15 ENCOUNTER — Telehealth: Payer: Medicare HMO

## 2020-10-20 ENCOUNTER — Ambulatory Visit: Payer: Medicare HMO

## 2020-10-20 DIAGNOSIS — I10 Essential (primary) hypertension: Secondary | ICD-10-CM

## 2020-10-20 DIAGNOSIS — K219 Gastro-esophageal reflux disease without esophagitis: Secondary | ICD-10-CM

## 2020-10-20 NOTE — Progress Notes (Signed)
Chronic Care Management Pharmacy Note  10/20/2020 Name:  MARAE COTTRELL  MRN:  [id number] DOB:  November 15, 1945  Summary: No Rx changes, plan to f/u 02/2021 to help renew patient assistance on Dexilant  Subjective: REGENIA ERCK is an 75 y.o. year old female who is a primary patient of Tabori, Aundra Millet, MD.  The CCM team was consulted for assistance with disease management and care coordination needs.    Engaged with patient by telephone for follow up visit in response to provider referral for pharmacy case management and/or care coordination services.   Consent to Services:  The patient was given information about Chronic Care Management services, agreed to services, and gave verbal consent prior to initiation of services.  Please see initial visit note for detailed documentation.   Patient Care Team: [name], MD as PCP - General (Family Medicine) Mauri Pole, MD as Consulting Physician (Gastroenterology) Penni Bombard, MD as Consulting Physician (Neurology) Harriett Sine, MD as Consulting Physician (Dermatology) Manson Passey, [facility] (Specialist) Madelin Rear, [facility] as Pharmacist (Pharmacist)  Hospital visits: None in previous 6 months  Objective:  Lab Results  Component Value Date   CREATININE 0.79 06/07/2020   CREATININE 0.83 05/13/2020   CREATININE 0.69 11/14/2019    No results found for: HGBA1C Last diabetic Eye exam: No results found for: HMDIABEYEEXA  Last diabetic Foot exam: No results found for: HMDIABFOOTEX      Component Value Date/Time   CHOL 137 05/13/2020 0856   TRIG 66.0 05/13/2020 0856   HDL 41.80 05/13/2020 0856   CHOLHDL 3 05/13/2020 0856   VLDL 13.2 05/13/2020 0856   LDLCALC 82 05/13/2020 0856    Hepatic Function Latest Ref Rng & Units 05/13/2020 11/14/2019 08/14/2019  Total Protein 6.0 - 8.3 g/dL 6.9 6.0 6.0  Albumin 3.5 - 5.2 g/dL 3.9 3.9 3.8  AST 0 - 37 U/L 39(H) 17 16  ALT 0 - 35 U/L 36(H) 17 18  Alk Phosphatase 39 - 117  U/L 103 84 103  Total Bilirubin 0.2 - 1.2 mg/dL 0.5 0.5 0.3  Bilirubin, Direct 0.0 - 0.3 mg/dL 0.1 0.1 0.1    Lab Results  Component Value Date/Time   TSH 1.18 05/13/2020 08:56 AM   TSH 1.94 11/14/2019 09:45 AM   CBC Latest Ref Rng & Units 05/13/2020 11/14/2019 08/14/2019  WBC 4.0 - 10.5 K/uL 6.3 7.8 10.7(H)  Hemoglobin 12.0 - 15.0 g/dL 12.3 12.2 12.1  Hematocrit 36.0 - 46.0 % 36.7 36.9 36.5  Platelets 150.0 - 400.0 K/uL 268.0 254.0 276.0    Lab Results  Component Value Date/Time   VD25OH 24.73 (L) 05/13/2020 08:56 AM    Clinical ASCVD:  The 10-year ASCVD risk score Mikey Bussing DC Jr., et al., 2013) is: 15.7%   Values used to calculate the score:     Age: 83 years     Sex: Female     Is Non-Hispanic African American: No     Diabetic: No     Tobacco smoker: No     Systolic Blood Pressure: 458 mmHg     Is BP treated: Yes     HDL Cholesterol: 41.8 mg/dL     Total Cholesterol: 137 mg/dL    Social History   Tobacco Use  Smoking Status Never  Smokeless Tobacco Never   BP Readings from Last 3 Encounters:  05/13/20 118/70  05/03/20 126/64  11/14/19 121/78   Pulse Readings from Last 3 Encounters:  05/13/20 74  05/03/20 85  11/14/19 80  Wt Readings from Last 3 Encounters:  05/13/20 178 lb (80.7 kg)  05/03/20 179 lb 6.4 oz (81.4 kg)  11/14/19 185 lb 8 oz (84.1 kg)    Assessment: Review of patient past medical history, allergies, medications, health status, including review of consultants reports, laboratory and other test data, was performed as part of comprehensive evaluation and provision of chronic care management services.   SDOH:  (Social Determinants of Health) assessments and interventions performed:    CCM Care Plan  Allergies  Allergen Reactions   Nitrofurantoin     Makes mouth break out/blisters   Propoxyphene N-Acetaminophen     REACTION: nausea   Nexium [Esomeprazole Magnesium] Nausea And Vomiting   Nortriptyline Hcl Nausea And Vomiting   Hydrocodone  Bit-Homatrop Mbr Itching and Rash   Morphine Itching and Rash    Medications Reviewed Today     Reviewed by Madelin Rear, [facility] (Pharmacist) on 10/20/20 at [phone]  Med List Status: <None>   Medication Order Taking? Sig Documenting Provider Last Dose Status Informant  albuterol (VENTOLIN HFA) 108 (90 Base) MCG/ACT inhaler [id number]  TAKE 2 PUFFS BY MOUTH EVERY 6 HOURS AS NEEDED FOR WHEEZE OR SHORTNESS OF BREATH [name], MD  Active   aspirin EC 81 MG tablet [id number]  Take 1 tablet (81 mg total) by mouth daily. [name], MD  Active   doxycycline (VIBRA-TABS) 100 MG tablet [id number]  Take 1 tablet (100 mg total) by mouth 2 (two) times daily. [name], MD  Active   DULoxetine (CYMBALTA) 20 MG capsule [id number]  Take 20 mg by mouth daily. 2 daily [provider]  Active   furosemide (LASIX) 20 MG tablet [id number]  Take 1 tablet (20 mg total) by mouth daily. [name], MD  Active   gabapentin (NEURONTIN) 300 MG capsule [id number] Yes TAKE 2 CAPSULES BY MOUTH 2 TIMES DAILY. [name], MD  Active   latanoprost (XALATAN) 0.005 % ophthalmic solution [id number]  Place 1 drop into both eyes at bedtime.  [provider]  Active            Med Note Rosana Hoes, Tania Ade   Mon Jan 17, 2016  1:43 PM)    losartan-hydrochlorothiazide [facility]) 50-12.5 MG tablet [id number] Yes TAKE 1 TABLET BY MOUTH EVERY DAY [name], MD Taking Active   mupirocin ointment (BACTROBAN) 2 % [id number]  Apply 1 application topically 2 (two) times daily. Criselda Peaches, DPM  Active   potassium chloride SA (KLOR-CON) 20 MEQ tablet [id number] No Take 1 tablet (20 mEq total) by mouth daily.  Patient not taking: Reported on 10/20/2020   [name], MD Not Taking Active   pramipexole (MIRAPEX) 1 MG tablet [id number] Yes TAKE 1 TABLET BY MOUTH TWICE A DAY [name], MD Taking Active   tiZANidine (ZANAFLEX) 4 MG tablet [id number]  TAKE 1 TABLET (4 MG  TOTAL) BY MOUTH 3 (THREE) TIMES DAILY AS NEEDED FOR MUSCLE SPASMS. Dutch Quint B, FNP  Active   Vitamin D, Ergocalciferol, (DRISDOL) 1.25 MG (50000 UNIT) CAPS capsule [id number] No Take 1 capsule (50,000 Units total) by mouth every 7 (seven) days.  Patient not taking: Reported on 10/20/2020   [name], MD Not Taking Active             Patient Active Problem List   Diagnosis Date Noted   Physical exam 11/14/2019   Restless legs syndrome (RLS) 08/29/2016   Lumbar radiculopathy 08/29/2016   Obesity (  BMI 30-39.9) 02/23/2016   Neuropathy 08/13/2012   Essential hypertension 02/05/2009   ARTHRITIS 02/05/2009   Dysphagia 02/05/2009   ESOPHAGEAL STRICTURE 02/04/2009   GERD 02/04/2009   GASTRITIS 02/04/2009   HIATAL HERNIA 02/04/2009   DIVERTICULOSIS OF COLON 02/04/2009   IRRITABLE BOWEL SYNDROME 02/04/2009   ENDOMETRIOSIS 02/04/2009    Immunization History  Administered Date(s) Administered   Fluad Quad(high Dose 65+) 11/10/2018, 12/28/2019   Influenza, High Dose Seasonal PF 01/11/2018   Influenza,inj,Quad PF,6+ Mos 12/23/2014, 10/24/2015, 11/14/2016   Moderna Sars-Covid-2 Vaccination 04/11/2019, 05/10/2019, 03/03/2020   Pneumococcal Conjugate-13 09/09/2013   Pneumococcal Polysaccharide-23 12/20/2011   Tdap 07/28/2005    Conditions to be addressed/monitored: GERD HTN RLS Vitamin D deficiency  Care Plan : ccm pharmacy care plan  Updates made by Madelin Rear, [place] since 10/20/2020 12:00 AM     Problem: GERD HTN RLS Vitamin D deficiency   Priority: High     Long-Range Goal: Disease Management   Start Date: 10/20/2020  Expected End Date: 10/20/2021  This Visit's Progress: On track  Priority: High  Note:    Pharmacist Clinical Goal(s):  Patient will verbalize ability to afford treatment regimen contact provider office for questions/concerns as evidenced notation of same in electronic health record through collaboration with PharmD and provider.    Interventions: 1:1 collaboration with [name], MD regarding development and update of comprehensive plan of care as evidenced by provider attestation and co-signature Inter-disciplinary care team collaboration (see longitudinal plan of care) Comprehensive medication review performed; medication list updated in electronic medical record  Hypertension (BP goal <140/90) -Controlled -Current treatment: Losartan-HCTZ 50-12.5 mg once daily Furosemide 20 mg daily as needed  -Current home readings: 120s-130s/70s -Current dietary habits: low salt -Denies hypotensive/hypertensive symptoms -Educated on BP goals and benefits of medications for prevention of heart attack, stroke and kidney damage; Importance of home blood pressure monitoring; August 2022 update: Reviewed side effects - no problems noted. Swelling has not been an issue  -Counseled to monitor BP at home 1-2x/day, document, and provide log at future appointments -Recommended to continue current medication  GERD-complicated (optimize medication safety, ensure accessibility) -Controlled -Current treatment  Dexilant 60 mg once daily -Reviewed triggers: No issues identified, aware triggers including tomato sauce  -Counseled on diet extensively -Some inconsistent use - is going to get back to taking once daily  Recommended to continue current medication  Vitamin D deficiency (Goal: ensure appropriate Rx/supplementation) -Uncontrolled -Current treatment  START Vitamin D3 1000 units -Recommended to continue current medication  -Reviewed previous labs and encouraged daily use of at least 1000 units vitamin D3 daily -Consider updating DEXA  Patient Goals/Self-Care Activities Patient will:  - engage in dietary modifications by avoiding reflux triggers (spicy foods, tomato sauce, chocolate)  Medication Assistance: None required.  Patient affirms current coverage meets needs.  Patient's preferred pharmacy  is:  [id number] [place], [place] - [address] [place] [address]Phone: [phone]: [phone] Follow Up:  Patient agrees to Care Plan and Follow-up. Plan: CPP f/u telephone visit 4 months for BP/PAP review     Future Appointments  Date Time Provider DJenkins 11/19/2020  8:00 AM TMidge Minium MD [facility] PEC  02/09/2021  9:00 AM [facility] CCM PHARMACIST [facility]  05/09/2021  8:15 AM [facility] HEALTH COACH [facility] [place] PharmD, CPP Clinical Pharmacist Practitioner  [phone]

## 2020-10-20 NOTE — Patient Instructions (Addendum)
Ms. Crumby,  Thank you for talking with me today. I have included our care plan/goals in the following pages.   Please review and call me at [phone] with any questions.  Thanks! Ellin Mayhew, Pharm.D., BCGP Clinical Pharmacist [phone]  Patient Care Plan: ccm pharmacy care plan     Problem Identified: GERD HTN RLS Vitamin D deficiency   Priority: High     Long-Range Goal: Disease Management   Start Date: 10/20/2020  Expected End Date: 10/20/2021  This Visit's Progress: On track  Priority: High  Note:    Pharmacist Clinical Goal(s):  Patient will verbalize ability to afford treatment regimen contact provider office for questions/concerns as evidenced notation of same in electronic health record through collaboration with PharmD and provider.   Interventions: 1:1 collaboration with [name], MD regarding development and update of comprehensive plan of care as evidenced by provider attestation and co-signature Inter-disciplinary care team collaboration (see longitudinal plan of care) Comprehensive medication review performed; medication list updated in electronic medical record  Hypertension (BP goal <140/90) -Controlled -Current treatment: Losartan-HCTZ 50-12.5 mg once daily Furosemide 20 mg daily as needed  -Current home readings: 120s-130s/70s -Current dietary habits: low salt -Denies hypotensive/hypertensive symptoms -Educated on BP goals and benefits of medications for prevention of heart attack, stroke and kidney damage; Importance of home blood pressure monitoring; August 2022 update: Reviewed side effects - no problems noted. Swelling has not been an issue  -Counseled to monitor BP at home 1-2x/day, document, and provide log at future appointments -Recommended to continue current medication  GERD-complicated (optimize medication safety, ensure accessibility) -Controlled -Current treatment  Dexilant 60 mg once daily -Reviewed  triggers: No issues identified, aware triggers including tomato sauce  -Counseled on diet extensively -Some inconsistent use - is going to get back to taking once daily  Recommended to continue current medication  Vitamin D deficiency (Goal: ensure appropriate Rx/supplementation) -Uncontrolled -Current treatment  START Vitamin D3 1000 units -Recommended to continue current medication  -Reviewed previous labs and encouraged daily use of at least 1000 units vitamin D3 daily -Consider updating DEXA  Patient Goals/Self-Care Activities Patient will:  - engage in dietary modifications by avoiding reflux triggers (spicy foods, tomato sauce, chocolate)  Medication Assistance: None required.  Patient affirms current coverage meets needs.  Patient's preferred pharmacy [facility]:  [id number] [place], [place] - [address] [place] [address]Phone: [phone]: [phone] Follow Up:  Patient agrees to Care Plan and Follow-up. Plan: CPP f/u telephone visit 4 months for BP/PAP review      The patient verbalized understanding of instructions provided today and agreed to receive a MyChart copy of patient instruction and/or educational materials. Telephone follow up appointment with pharmacy team member scheduled for: See next appointment with "Care Management Staff" under "What's Next" below.

## 2020-11-19 ENCOUNTER — Encounter: Payer: Medicare HMO | Admitting: Family Medicine

## 2020-11-22 DIAGNOSIS — H401232 Low-tension glaucoma, bilateral, moderate stage: Secondary | ICD-10-CM | POA: Diagnosis not present

## 2020-11-22 DIAGNOSIS — Z961 Presence of intraocular lens: Secondary | ICD-10-CM | POA: Diagnosis not present

## 2020-12-04 ENCOUNTER — Other Ambulatory Visit: Payer: Self-pay | Admitting: Family Medicine

## 2020-12-20 ENCOUNTER — Telehealth: Payer: Self-pay

## 2020-12-20 NOTE — Progress Notes (Signed)
Chronic Care Management Pharmacy Assistant   Name: Andrea Byrd  MRN: [id number] DOB: [birth year]   Reason for Encounter: Disease State - Hypertension Call     Recent office visits:  None noted.   Recent consult visits:  None noted.   Hospital visits:  None in previous 6 months  Medications: Outpatient Encounter Medications as of 12/20/2020  Medication Sig   albuterol (VENTOLIN HFA) 108 (90 Base) MCG/ACT inhaler TAKE 2 PUFFS BY MOUTH EVERY 6 HOURS AS NEEDED FOR WHEEZE OR SHORTNESS OF BREATH   aspirin EC 81 MG tablet Take 1 tablet (81 mg total) by mouth daily.   doxycycline (VIBRA-TABS) 100 MG tablet Take 1 tablet (100 mg total) by mouth 2 (two) times daily.   DULoxetine (CYMBALTA) 20 MG capsule Take 20 mg by mouth daily. 2 daily   furosemide (LASIX) 20 MG tablet Take 1 tablet (20 mg total) by mouth daily.   gabapentin (NEURONTIN) 300 MG capsule TAKE 2 CAPSULES BY MOUTH 2 TIMES DAILY.   latanoprost (XALATAN) 0.005 % ophthalmic solution Place 1 drop into both eyes at bedtime.    losartan-hydrochlorothiazide (HYZAAR) 50-12.5 MG tablet TAKE 1 TABLET BY MOUTH EVERY DAY   mupirocin ointment (BACTROBAN) 2 % Apply 1 application topically 2 (two) times daily.   potassium chloride SA (KLOR-CON) 20 MEQ tablet Take 1 tablet (20 mEq total) by mouth daily. (Patient not taking: Reported on 10/20/2020)   pramipexole (MIRAPEX) 1 MG tablet TAKE 1 TABLET BY MOUTH TWICE A DAY   tiZANidine (ZANAFLEX) 4 MG tablet TAKE 1 TABLET (4 MG TOTAL) BY MOUTH 3 (THREE) TIMES DAILY AS NEEDED FOR MUSCLE SPASMS.   Vitamin D, Ergocalciferol, (DRISDOL) 1.25 MG (50000 UNIT) CAPS capsule Take 1 capsule (50,000 Units total) by mouth every 7 (seven) days. (Patient not taking: Reported on 10/20/2020)   No facility-administered encounter medications on file as of 12/20/2020.    Current antihypertensive regimen:  Losartan-HCTZ 50-12.5 mg once daily Furosemide 20 mg daily as needed (pt reported has not been needing  much)  How often are you checking your Blood Pressure?  Patient reported she is checking her blood pressures daily and they fluctuate depending on her stress level that day.    Current home BP readings: 126/83  (12/22/20)   What recent interventions/DTPs have been made by any provider to improve Blood Pressure control since last CPP Visit:  Patient denied any recent changes to her current regimen.   Any recent hospitalizations or ED visits since last visit with CPP?  Patient has not had any hospitalizations or ED visits since last visit with CPP.   What diet changes have been made to improve Blood Pressure Control?  Patient reported she watches her sodium intake in her diet.    What exercise is being done to improve your Blood Pressure Control?  Patient reported she does not exercise she takes care of her husband who requires full time care.     Adherence Review: Is the patient currently on ACE/ARB medication? Yes Does the patient have >5 day gap between last estimated fill dates? No  Losartan-HCTZ 50-12.5 mg once daily - last filled  12/06/20 90 days  Furosemide 20 mg daily as needed  - last filled 01/16/20 (Pt only uses when needed)   Care Gaps  AWV: done 05/03/20 Colonoscopy: done 04/12/11 DM Eye Exam: N/A DM Foot Exam: N/A Microalbumin: N/A HbgAIC: N/A DEXA: done 02/19/12 Mammogram: done 04/19/20  Star Rating Drugs: Losartan-hctz 50-12.5 mg daily - last filled  12/06/20  90 days   Future Appointments  Date Time Provider [place]  02/09/2021  9:00 AM [facility] CCM PHARMACIST [facility]  02/18/2021 12:30 PM [name], MD [facility]  05/09/2021  8:15 AM [facility] HEALTH COACH [facility] [place], CCMA Clinical Pharmacist Assistant  [phone]  Time Spent: 40 minutes

## 2021-01-07 ENCOUNTER — Other Ambulatory Visit: Payer: Self-pay | Admitting: Family Medicine

## 2021-01-08 ENCOUNTER — Other Ambulatory Visit: Payer: Self-pay | Admitting: Family Medicine

## 2021-01-21 DIAGNOSIS — R69 Illness, unspecified: Secondary | ICD-10-CM | POA: Diagnosis not present

## 2021-01-21 DIAGNOSIS — R32 Unspecified urinary incontinence: Secondary | ICD-10-CM | POA: Diagnosis not present

## 2021-01-21 DIAGNOSIS — G8929 Other chronic pain: Secondary | ICD-10-CM | POA: Diagnosis not present

## 2021-01-21 DIAGNOSIS — Z6834 Body mass index (BMI) 34.0-34.9, adult: Secondary | ICD-10-CM | POA: Diagnosis not present

## 2021-01-21 DIAGNOSIS — E669 Obesity, unspecified: Secondary | ICD-10-CM | POA: Diagnosis not present

## 2021-01-21 DIAGNOSIS — K219 Gastro-esophageal reflux disease without esophagitis: Secondary | ICD-10-CM | POA: Diagnosis not present

## 2021-01-21 DIAGNOSIS — Z7982 Long term (current) use of aspirin: Secondary | ICD-10-CM | POA: Diagnosis not present

## 2021-01-21 DIAGNOSIS — I10 Essential (primary) hypertension: Secondary | ICD-10-CM | POA: Diagnosis not present

## 2021-01-21 DIAGNOSIS — Z8249 Family history of ischemic heart disease and other diseases of the circulatory system: Secondary | ICD-10-CM | POA: Diagnosis not present

## 2021-01-21 DIAGNOSIS — F419 Anxiety disorder, unspecified: Secondary | ICD-10-CM | POA: Diagnosis not present

## 2021-01-21 DIAGNOSIS — M545 Low back pain, unspecified: Secondary | ICD-10-CM | POA: Diagnosis not present

## 2021-01-21 DIAGNOSIS — G629 Polyneuropathy, unspecified: Secondary | ICD-10-CM | POA: Diagnosis not present

## 2021-01-21 DIAGNOSIS — H409 Unspecified glaucoma: Secondary | ICD-10-CM | POA: Diagnosis not present

## 2021-01-21 DIAGNOSIS — M1990 Unspecified osteoarthritis, unspecified site: Secondary | ICD-10-CM | POA: Diagnosis not present

## 2021-01-21 DIAGNOSIS — Z008 Encounter for other general examination: Secondary | ICD-10-CM | POA: Diagnosis not present

## 2021-01-22 ENCOUNTER — Other Ambulatory Visit: Payer: Self-pay | Admitting: Family Medicine

## 2021-02-01 ENCOUNTER — Telehealth: Payer: Self-pay | Admitting: Family Medicine

## 2021-02-01 NOTE — Telephone Encounter (Signed)
Pt is requesting a refill for pramipexole (MIRAPEX) 1 MG tablet.  [facility]arm[facility]: [id number]

## 2021-02-01 NOTE — Telephone Encounter (Signed)
Called the patient back. It appears that her pcp has been prescribing this medication for her for a while now. Asked the pt if she reached out to them about refilling. Pt states that they had refused it. Looks like the script was sent per her primary care on 01/10/2021.

## 2021-02-01 NOTE — Telephone Encounter (Signed)
Called the patient and made aware that the script was sent to the pharmacy on 11/7 by her pcp and that it was a 3 mth supply with a refill advised that we will keep the upcoming feb apt since she described having worsening like symptoms and will place on wait list. She was appreciative

## 2021-02-07 NOTE — Progress Notes (Signed)
Chronic Care Management Pharmacy Note  02/09/2021 Name:  Andrea Byrd  MRN:  [id number] DOB:  April 05, 1945  Summary: Renewal of Dexilant PAP.  Patient Bp fluctuating due to stressor of taking care of husband.  Will have check in by CMA in 30 days.  Further eval on meds at that time.  Subjective: ROLONDA PONTARELLI is an 75 y.o. year old female who is a primary patient of Tabori, Aundra Millet, MD.  The CCM team was consulted for assistance with disease management and care coordination needs.    Engaged with patient by telephone for follow up visit in response to provider referral for pharmacy case management and/or care coordination services.   Consent to Services:  The patient was given information about Chronic Care Management services, agreed to services, and gave verbal consent prior to initiation of services.  Please see initial visit note for detailed documentation.   Patient Care Team: [name], MD as PCP - General (Family Medicine) Mauri Pole, MD as Consulting Physician (Gastroenterology) Penni Bombard, MD as Consulting Physician (Neurology) Harriett Sine, MD as Consulting Physician (Dermatology) [facility] (Specialist) Edythe Clarity, [facility] (Pharmacist)  Hospital visits: None in previous 6 months  Objective:  Lab Results  Component Value Date   CREATININE 0.79 06/07/2020   CREATININE 0.83 05/13/2020   CREATININE 0.69 11/14/2019    No results found for: HGBA1C Last diabetic Eye exam: No results found for: HMDIABEYEEXA  Last diabetic Foot exam: No results found for: HMDIABFOOTEX      Component Value Date/Time   CHOL 137 05/13/2020 0856   TRIG 66.0 05/13/2020 0856   HDL 41.80 05/13/2020 0856   CHOLHDL 3 05/13/2020 0856   VLDL 13.2 05/13/2020 0856   LDLCALC 82 05/13/2020 0856    Hepatic Function Latest Ref Rng & Units 05/13/2020 11/14/2019 08/14/2019  Total Protein 6.0 - 8.3 g/dL 6.9 6.0 6.0  Albumin 3.5 - 5.2 g/dL 3.9 3.9 3.8  AST 0  - 37 U/L 39(H) 17 16  ALT 0 - 35 U/L 36(H) 17 18  Alk Phosphatase 39 - 117 U/L 103 84 103  Total Bilirubin 0.2 - 1.2 mg/dL 0.5 0.5 0.3  Bilirubin, Direct 0.0 - 0.3 mg/dL 0.1 0.1 0.1    Lab Results  Component Value Date/Time   TSH 1.18 05/13/2020 08:56 AM   TSH 1.94 11/14/2019 09:45 AM   CBC Latest Ref Rng & Units 05/13/2020 11/14/2019 08/14/2019  WBC 4.0 - 10.5 K/uL 6.3 7.8 10.7(H)  Hemoglobin 12.0 - 15.0 g/dL 12.3 12.2 12.1  Hematocrit 36.0 - 46.0 % 36.7 36.9 36.5  Platelets 150.0 - 400.0 K/uL 268.0 254.0 276.0    Lab Results  Component Value Date/Time   VD25OH 24.73 (L) 05/13/2020 08:56 AM    Clinical ASCVD:  The 10-year ASCVD risk score (Arnett DK, et al., 2019) is: 15.7%   Values used to calculate the score:     Age: 61 years     Sex: Female     Is Non-Hispanic African American: No     Diabetic: No     Tobacco smoker: No     Systolic Blood Pressure: 276 mmHg     Is BP treated: Yes     HDL Cholesterol: 41.8 mg/dL     Total Cholesterol: 137 mg/dL    Social History   Tobacco Use  Smoking Status Never  Smokeless Tobacco Never   BP Readings from Last 3 Encounters:  05/13/20 118/70  05/03/20 126/64  11/14/19  121/78   Pulse Readings from Last 3 Encounters:  05/13/20 74  05/03/20 85  11/14/19 80   Wt Readings from Last 3 Encounters:  05/13/20 178 lb (80.7 kg)  05/03/20 179 lb 6.4 oz (81.4 kg)  11/14/19 185 lb 8 oz (84.1 kg)    Assessment: Review of patient past medical history, allergies, medications, health status, including review of consultants reports, laboratory and other test data, was performed as part of comprehensive evaluation and provision of chronic care management services.   SDOH:  (Social Determinants of Health) assessments and interventions performed:    CCM Care Plan  Allergies  Allergen Reactions   Nitrofurantoin     Makes mouth break out/blisters   Propoxyphene N-Acetaminophen     REACTION: nausea   Nexium [Esomeprazole Magnesium]  Nausea And Vomiting   Nortriptyline Hcl Nausea And Vomiting   Hydrocodone Bit-Homatrop Mbr Itching and Rash   Morphine Itching and Rash    Medications Reviewed Today     Reviewed by Edythe Clarity, [facility] (Pharmacist) on 02/09/21 at 1219  Med List Status: <None>   Medication Order Taking? Sig Documenting Provider Last Dose Status Informant  albuterol (VENTOLIN HFA) 108 (90 Base) MCG/ACT inhaler [id number] Yes TAKE 2 PUFFS BY MOUTH EVERY 6 HOURS AS NEEDED FOR WHEEZE OR SHORTNESS OF BREATH [name], MD Taking Active   aspirin EC 81 MG tablet [id number] Yes Take 1 tablet (81 mg total) by mouth daily. [name], MD Taking Active   doxycycline (VIBRA-TABS) 100 MG tablet [id number] Yes Take 1 tablet (100 mg total) by mouth 2 (two) times daily. [name], MD Taking Active   furosemide (LASIX) 20 MG tablet [id number] Yes Take 1 tablet (20 mg total) by mouth daily. [name], MD Taking Active   gabapentin (NEURONTIN) 300 MG capsule [id number] Yes TAKE 2 CAPSULES BY MOUTH 2 TIMES DAILY. [name], MD Taking Active   latanoprost (XALATAN) 0.005 % ophthalmic solution [id number] Yes Place 1 drop into both eyes at bedtime.  [provider] Taking Active            Med Note Rosana Hoes, Tania Ade   Mon Jan 17, 2016  1:43 PM)    losartan-hydrochlorothiazide [facility]) 50-12.5 MG tablet [id number] Yes TAKE 1 TABLET BY MOUTH EVERY DAY [name], MD Taking Active   mupirocin ointment (BACTROBAN) 2 % [id number] Yes Apply 1 application topically 2 (two) times daily. Criselda Peaches, DPM Taking Active   potassium chloride SA (KLOR-CON) 20 MEQ tablet [id number] Yes Take 1 tablet (20 mEq total) by mouth daily. [name], MD Taking Active   pramipexole (MIRAPEX) 1 MG tablet [id number] Yes TAKE 1 TABLET BY MOUTH TWICE A DAY [name], MD Taking Active   tiZANidine (ZANAFLEX) 4 MG tablet [id number] Yes Take 1 tablet (4 mg total) by mouth 3 (three)  times daily as needed for muscle spasms. [name], MD Taking Active   Vitamin D, Ergocalciferol, (DRISDOL) 1.25 MG (50000 UNIT) CAPS capsule [id number] Yes Take 1 capsule (50,000 Units total) by mouth every 7 (seven) days. [name], MD Taking Active             Patient Active Problem List   Diagnosis Date Noted   Physical exam 11/14/2019   Restless legs syndrome (RLS) 08/29/2016   Lumbar radiculopathy 08/29/2016   Obesity (BMI 30-39.9) 02/23/2016   Neuropathy 08/13/2012   Essential hypertension 02/05/2009   ARTHRITIS 02/05/2009   Dysphagia 02/05/2009  ESOPHAGEAL STRICTURE 02/04/2009   GERD 02/04/2009   GASTRITIS 02/04/2009   HIATAL HERNIA 02/04/2009   DIVERTICULOSIS OF COLON 02/04/2009   IRRITABLE BOWEL SYNDROME 02/04/2009   ENDOMETRIOSIS 02/04/2009    Immunization History  Administered Date(s) Administered   Fluad Quad(high Dose 65+) 11/10/2018, 12/28/2019, 12/24/2020   Influenza, High Dose Seasonal PF 01/11/2018   Influenza,inj,Quad PF,6+ Mos 12/23/2014, 10/24/2015, 11/14/2016   Moderna Sars-Covid-2 Vaccination 04/11/2019, 05/10/2019, 03/03/2020   Pneumococcal Conjugate-13 09/09/2013   Pneumococcal Polysaccharide-23 12/20/2011   Tdap 07/28/2005    Conditions to be addressed/monitored: GERD HTN RLS Vitamin D deficiency  Care Plan : ccm pharmacy care plan  Updates made by Edythe Clarity, RPH since 02/09/2021 12:00 AM     Problem: GERD HTN RLS Vitamin D deficiency   Priority: High     Long-Range Goal: Disease Management   Start Date: 10/20/2020  Expected End Date: 10/20/2021  Recent Progress: On track  Priority: High  Note:    Pharmacist Clinical Goal(s):  Patient will verbalize ability to afford treatment regimen contact provider office for questions/concerns as evidenced notation of same in electronic health record through collaboration with PharmD and provider.  Control BP  Interventions: 1:1 collaboration with [name], MD regarding development and update of comprehensive plan of care as evidenced by provider attestation and co-signature Inter-disciplinary care team collaboration (see longitudinal plan of care) Comprehensive medication review performed; medication list updated in electronic medical record  Hypertension (BP goal <140/90) -Controlled -Current treatment: Losartan-HCTZ 50-12.5 mg once daily Furosemide 20 mg daily as needed  -Current home readings: 120s-130s/70s -Current dietary habits: low salt -Denies hypotensive/hypertensive symptoms -Educated on BP goals and benefits of medications for prevention of heart attack, stroke and kidney damage; Importance of home blood pressure monitoring; August 2022 update: Reviewed side effects - no problems noted. Swelling has not been an issue  -Counseled to monitor BP at home 1-2x/day, document, and provide log at future appointments -Recommended to continue current medication  Update 02/09/21 Recent home BP - 110/70, 123/74, 145/88 Reports BP is up and down depending on the stress of her day.   Taking care of bed bound husband has some effect on her BP. Continues to take medications as prescribed. Have asked her to monitor and let me know if BP is consistently > 140/90. Room to titrate dose on her Losartan/HCTZ if need to. Continue current meds for now will have BP check next month.  GERD-complicated (optimize medication safety, ensure accessibility) -Controlled -Current treatment  Dexilant 60 mg once daily -Reviewed triggers: No issues identified, aware triggers including tomato sauce  -Counseled on diet extensively -Some inconsistent use - is going to get back to taking once daily  Recommended to continue current medication  Update 02/09/21 Still reports relatively inconsistent use of Dexilant. She is having most of her symptoms at night. Discussed proper administration 30-60 minutes before a meal. Advised her to take 30-60 minutes before  her evening meal since most of her symptoms are at night.  Continue to watch triggers. Have started the process for renewing her Dexilant PAP form. Encouraged regular use, no changes at this time.  Vitamin D deficiency (Goal: ensure appropriate Rx/supplementation) -Uncontrolled -Current treatment  START Vitamin D3 1000 units -Recommended to continue current medication  -Reviewed previous labs and encouraged daily use of at least 1000 units vitamin D3 daily -Consider updating DEXA  Patient Goals/Self-Care Activities Patient will:  - engage in dietary modifications by avoiding reflux triggers (spicy foods, tomato sauce, chocolate)  Medication Assistance: None required.  Patient affirms current coverage meets needs.  Patient's preferred pharmacy is:  [id number] [place], [place] - [address] [place] [id number]: [phone]: [phone] Follow Up:  Patient agrees to Care Plan and Follow-up. Plan: CPP f/u telephone visit 6 months, CMA BP check 30 days        Future Appointments  Date Time Provider D[place]ifornsky 02/18/2021 12:30 PM TMidge Minium MD [facility]  05/02/2021 11:00 AM LDebbora Presto NP [facility] None  05/09/2021  8:15 AM [facility] HEALTH COACH [facility] [place] PharmD Clinical Pharmacist  [facility]([phone]

## 2021-02-09 ENCOUNTER — Telehealth: Payer: Self-pay | Admitting: Pharmacist

## 2021-02-09 ENCOUNTER — Ambulatory Visit: Payer: Medicare HMO | Admitting: Pharmacist

## 2021-02-09 ENCOUNTER — Other Ambulatory Visit: Payer: Self-pay

## 2021-02-09 DIAGNOSIS — I10 Essential (primary) hypertension: Secondary | ICD-10-CM

## 2021-02-09 DIAGNOSIS — K219 Gastro-esophageal reflux disease without esophagitis: Secondary | ICD-10-CM

## 2021-02-09 MED ORDER — TIZANIDINE HCL 4 MG PO TABS: 4.0000 mg | ORAL_TABLET | Freq: Three times a day (TID) | ORAL | 1 refills | Status: DC | PRN

## 2021-02-09 NOTE — Patient Instructions (Signed)
Visit Information   Goals Addressed   None    Patient Care Plan: ccm pharmacy care plan     Problem Identified: GERD HTN RLS Vitamin D deficiency   Priority: High     Long-Range Goal: Disease Management   Start Date: 10/20/2020  Expected End Date: 10/20/2021  This Visit's Progress: On track  Priority: High  Note:    Pharmacist Clinical Goal(s):  Patient will verbalize ability to afford treatment regimen contact provider office for questions/concerns as evidenced notation of same in electronic health record through collaboration with PharmD and provider.   Interventions: 1:1 collaboration with [name], MD regarding development and update of comprehensive plan of care as evidenced by provider attestation and co-signature Inter-disciplinary care team collaboration (see longitudinal plan of care) Comprehensive medication review performed; medication list updated in electronic medical record  Hypertension (BP goal <140/90) -Controlled -Current treatment: Losartan-HCTZ 50-12.5 mg once daily Furosemide 20 mg daily as needed  -Current home readings: 120s-130s/70s -Current dietary habits: low salt -Denies hypotensive/hypertensive symptoms -Educated on BP goals and benefits of medications for prevention of heart attack, stroke and kidney damage; Importance of home blood pressure monitoring; August 2022 update: Reviewed side effects - no problems noted. Swelling has not been an issue  -Counseled to monitor BP at home 1-2x/day, document, and provide log at future appointments -Recommended to continue current medication  GERD-complicated (optimize medication safety, ensure accessibility) -Controlled -Current treatment  Dexilant 60 mg once daily -Reviewed triggers: No issues identified, aware triggers including tomato sauce  -Counseled on diet extensively -Some inconsistent use - is going to get back to taking once daily  Recommended to continue current  medication  Vitamin D deficiency (Goal: ensure appropriate Rx/supplementation) -Uncontrolled -Current treatment  START Vitamin D3 1000 units -Recommended to continue current medication  -Reviewed previous labs and encouraged daily use of at least 1000 units vitamin D3 daily -Consider updating DEXA  Patient Goals/Self-Care Activities Patient will:  - engage in dietary modifications by avoiding reflux triggers (spicy foods, tomato sauce, chocolate)  Medication Assistance: None required.  Patient affirms current coverage meets needs.  Patient's preferred pharmacy is:  [id number] [place], [place] - [address] [place] [address] Phone: [phone] Fax: [phone]  Follow Up:  Patient agrees to Care Plan and Follow-up. Plan: CPP f/u telephone visit 4 months for BP/PAP review       Patient verbalizes understanding of instructions provided today and agrees to view in [place].  Telephone follow up appointment with pharmacy team member scheduled for: 6 months  Edythe Clarity, [place], PharmD Clinical Pharmacist  [facility] [phone]

## 2021-02-09 NOTE — Progress Notes (Addendum)
    Chronic Care Management Pharmacy Assistant   Name: KEYUNA CUTHRELL  MRN: [id number] DOB: 1945-08-24   Reason for Encounter: PAP for Dexilant   PAP filled out and will be mailed to patient to be completed by patient for Dexilant. Patient will provide requested information and documentation  and return PAP to prescribing office upon completion for final steps for processing. Patient will notify of outcome once application has been processed.    Jobe Gibbon, [facility] Clinical Pharmacist Assistant  [phone]

## 2021-02-16 ENCOUNTER — Other Ambulatory Visit: Payer: Self-pay | Admitting: Family Medicine

## 2021-02-18 ENCOUNTER — Ambulatory Visit (INDEPENDENT_AMBULATORY_CARE_PROVIDER_SITE_OTHER): Payer: Medicare HMO | Admitting: Family Medicine

## 2021-02-18 ENCOUNTER — Encounter: Payer: Self-pay | Admitting: Family Medicine

## 2021-02-18 VITALS — BP 128/72 | HR 71 | Temp 98.5°F | Resp 16 | Ht 63.0 in | Wt 181.6 lb

## 2021-02-18 DIAGNOSIS — K219 Gastro-esophageal reflux disease without esophagitis: Secondary | ICD-10-CM

## 2021-02-18 DIAGNOSIS — G629 Polyneuropathy, unspecified: Secondary | ICD-10-CM

## 2021-02-18 DIAGNOSIS — E559 Vitamin D deficiency, unspecified: Secondary | ICD-10-CM | POA: Diagnosis not present

## 2021-02-18 DIAGNOSIS — F4323 Adjustment disorder with mixed anxiety and depressed mood: Secondary | ICD-10-CM

## 2021-02-18 DIAGNOSIS — R69 Illness, unspecified: Secondary | ICD-10-CM | POA: Diagnosis not present

## 2021-02-18 DIAGNOSIS — I10 Essential (primary) hypertension: Secondary | ICD-10-CM

## 2021-02-18 DIAGNOSIS — Z0000 Encounter for general adult medical examination without abnormal findings: Secondary | ICD-10-CM | POA: Diagnosis not present

## 2021-02-18 DIAGNOSIS — F4320 Adjustment disorder, unspecified: Secondary | ICD-10-CM | POA: Insufficient documentation

## 2021-02-18 HISTORY — DX: Vitamin D deficiency, unspecified: E55.9

## 2021-02-18 MED ORDER — PREGABALIN 50 MG PO CAPS: 50.0000 mg | ORAL_CAPSULE | Freq: Three times a day (TID) | ORAL | 1 refills | Status: DC

## 2021-02-18 MED ORDER — DEXLANSOPRAZOLE 60 MG PO CPDR: 60.0000 mg | DELAYED_RELEASE_CAPSULE | Freq: Every day | ORAL | 1 refills | Status: DC

## 2021-02-18 MED ORDER — VITAMIN D (ERGOCALCIFEROL) 1.25 MG (50000 UNIT) PO CAPS: 50000.0000 [IU] | ORAL_CAPSULE | ORAL | 0 refills | Status: DC

## 2021-02-18 MED ORDER — FUROSEMIDE 20 MG PO TABS: 20.0000 mg | ORAL_TABLET | Freq: Every day | ORAL | 1 refills | Status: DC

## 2021-02-18 NOTE — Assessment & Plan Note (Signed)
Deteriorated.  Pt didn't notice any improvement w/ gabapentin and is fearful to go up on the dose b/c she needs to be alert to care for her husband.  Will switch to Lyrica TID and monitor for improvement.  Pt expressed understanding and is in agreement w/ plan.

## 2021-02-18 NOTE — Assessment & Plan Note (Signed)
Check labs and replete prn. 

## 2021-02-18 NOTE — Patient Instructions (Addendum)
Follow up in 6 months to recheck BP We'll notify you of your lab results and make any changes if needed Start the Lyrica 3x/day as directed for neuropathy Continue to work on healthy diet and regular exercise- you can do it! I want you to make sure you're taking care of yourself Call with any questions or concerns Stay Safe!  Stay Healthy! Happy Holidays!!

## 2021-02-18 NOTE — Assessment & Plan Note (Signed)
Chronic problem.  Well controlled.  Currently asymptomatic.  Check labs but no anticipated med changes. 

## 2021-02-18 NOTE — Assessment & Plan Note (Signed)
New.  Pt's husband is now completely bed bound and Hospice is involved.  She is the primary caregiver and only has help for a few hours each week.  She is not interested in medication b/c she doesn't want to be cloudy or altered since she needs to care for him.  Talked about the importance of self care- walking, socializing, relaxing- and asking for help.  Will continue to follow closely.

## 2021-02-18 NOTE — Assessment & Plan Note (Signed)
Deteriorated.  Pt has restarted Dexilant and brought the forms for pt assistance to be completed.

## 2021-02-18 NOTE — Assessment & Plan Note (Signed)
Pt's PE WNL w/ exception of obesity.  UTD on colonoscopy, mammo, PNA, flu.  Check labs.  Anticipatory guidance provided.

## 2021-02-18 NOTE — Progress Notes (Signed)
Subjective:    Patient ID: Andrea Byrd, female    DOB: 1945-10-29, 75 y.o.   MRN: [id number]  HPI CPE- UTD on colonoscopy, mammo, PNA, flu.  Patient Care Team    Relationship Specialty Notifications Start End  [name], MD PCP - General Family Medicine  01/17/16   Mauri Pole, MD Consulting Physician Gastroenterology  02/23/16   Penni Bombard, MD Consulting Physician Neurology  02/23/16   Harriett Sine, MD Consulting Physician Dermatology  08/30/16   Manson Passey, [facility]  Specialist  08/30/16   Edythe Clarity, [facility]  Pharmacist  02/09/21    Comment: [phone]    Health Maintenance  Topic Date Due   Hepatitis C Screening  Never done   Zoster Vaccines- Shingrix (1 of 2) Never done   TETANUS/TDAP  07/29/2015   COVID-19 Vaccine (4 - Booster for Moderna series) 04/28/2020   COLONOSCOPY (Pts 45-50yrs Insurance coverage will need to be confirmed)  02/08/2022   MAMMOGRAM  04/19/2022   Pneumonia Vaccine 74+ Years old  Completed   INFLUENZA VACCINE  Completed   DEXA SCAN  Completed   HPV VACCINES  Aged Out      Review of Systems Patient reports no vision/ hearing changes, adenopathy,fever, weight change,  persistant/recurrent hoarseness , swallowing issues, chest pain, palpitations, edema, persistant/recurrent cough, hemoptysis, dyspnea (rest/exertional/paroxysmal nocturnal), gastrointestinal bleeding (melena, rectal bleeding), abdominal pain, bowel changes, GU symptoms (dysuria, hematuria, incontinence), Gyn symptoms (abnormal  bleeding, pain),  syncope, focal weakness, memory loss, skin/hair/nail changes, abnormal bruising or bleeding.   + anxiety/depression- husband is completely bed bound at this time.  She has very little help.  Hospice is involved + neuropathy of feet bilaterally- no relief w/ gabapentin but sxs are severe.  Wants to know if there is anything else to help + GERD- deteriorated.  Pt has restarted Dexilant and brought forms today for patient  assistance  This visit occurred during the SARS-CoV-2 public health emergency.  Safety protocols were in place, including screening questions prior to the visit, additional usage of staff PPE, and extensive cleaning of exam room while observing appropriate contact time as indicated for disinfecting solutions.      Objective:   Physical Exam General Appearance:    Alert, cooperative, no distress, appears stated age  Head:    Normocephalic, without obvious abnormality, atraumatic  Eyes:    PERRL, conjunctiva/corneas clear, EOM's intact, fundi    benign, both eyes  Ears:    Normal TM's and external ear canals, both ears  Nose:   Deferred due to COVID  Throat:   Neck:   Supple, symmetrical, trachea midline, no adenopathy;    Thyroid: no enlargement/tenderness/nodules  Back:     Symmetric, no curvature, ROM normal, no CVA tenderness  Lungs:     Clear to auscultation bilaterally, respirations unlabored  Chest Wall:    No tenderness or deformity   Heart:    Regular rate and rhythm, S1 and S2 normal, no murmur, rub   or gallop  Breast Exam:    Deferred to mammo  Abdomen:     Soft, non-tender, bowel sounds active all four quadrants,    no masses, no organomegaly  Genitalia:    Deferred  Rectal:    Extremities:   Extremities normal, atraumatic, no cyanosis or edema  Pulses:   2+ and symmetric all extremities  Skin:   Skin color, texture, turgor normal, no rashes or lesions  Lymph nodes:   Cervical, supraclavicular, and axillary nodes  normal  Neurologic:   CNII-XII intact, normal strength, sensation and reflexes    throughout          Assessment & Plan:

## 2021-02-21 ENCOUNTER — Other Ambulatory Visit: Payer: Self-pay | Admitting: Family Medicine

## 2021-02-21 ENCOUNTER — Other Ambulatory Visit (INDEPENDENT_AMBULATORY_CARE_PROVIDER_SITE_OTHER): Payer: Medicare HMO

## 2021-02-21 ENCOUNTER — Telehealth: Payer: Self-pay | Admitting: Family Medicine

## 2021-02-21 DIAGNOSIS — E559 Vitamin D deficiency, unspecified: Secondary | ICD-10-CM

## 2021-02-21 DIAGNOSIS — I10 Essential (primary) hypertension: Secondary | ICD-10-CM

## 2021-02-21 LAB — LIPID PANEL
Cholesterol: 151 mg/dL (ref 0–200)
HDL: 49.3 mg/dL (ref >39.00)
LDL Cholesterol: 84 mg/dL (ref 0–99)
NonHDL: 101.76
Total CHOL/HDL Ratio: 3
Triglycerides: 90 mg/dL (ref 0.0–149.0)
VLDL: 18 mg/dL (ref 0.0–40.0)

## 2021-02-21 LAB — HEPATIC FUNCTION PANEL
ALT: 12 U/L (ref 0–35)
AST: 15 U/L (ref 0–37)
Albumin: 4 g/dL (ref 3.5–5.2)
Alkaline Phosphatase: 106 U/L (ref 39–117)
Bilirubin, Direct: 0.1 mg/dL (ref 0.0–0.3)
Total Bilirubin: 0.4 mg/dL (ref 0.2–1.2)
Total Protein: 6.2 g/dL (ref 6.0–8.3)

## 2021-02-21 LAB — CBC WITH DIFFERENTIAL/PLATELET
Basophils Absolute: 0.1 10*3/uL (ref 0.0–0.1)
Basophils Relative: 1 % (ref 0.0–3.0)
Eosinophils Absolute: 0.4 10*3/uL (ref 0.0–0.7)
Eosinophils Relative: 5.2 % — ABNORMAL HIGH (ref 0.0–5.0)
HCT: 39.2 % (ref 36.0–46.0)
Hemoglobin: 12.7 g/dL (ref 12.0–15.0)
Lymphocytes Relative: 19.3 % (ref 12.0–46.0)
Lymphs Abs: 1.4 10*3/uL (ref 0.7–4.0)
MCHC: 32.3 g/dL (ref 30.0–36.0)
MCV: 87.6 fl (ref 78.0–100.0)
Monocytes Absolute: 0.5 10*3/uL (ref 0.1–1.0)
Monocytes Relative: 6.2 % (ref 3.0–12.0)
Neutro Abs: 5.1 10*3/uL (ref 1.4–7.7)
Neutrophils Relative %: 68.3 % (ref 43.0–77.0)
Platelets: 275 10*3/uL (ref 150.0–400.0)
RBC: 4.48 Mil/uL (ref 3.87–5.11)
RDW: 13.9 % (ref 11.5–15.5)
WBC: 7.5 10*3/uL (ref 4.0–10.5)

## 2021-02-21 LAB — BASIC METABOLIC PANEL
BUN: 14 mg/dL (ref 6–23)
CO2: 32 mEq/L (ref 19–32)
Calcium: 9.5 mg/dL (ref 8.4–10.5)
Chloride: 103 mEq/L (ref 96–112)
Creatinine, Ser: 0.8 mg/dL (ref 0.40–1.20)
GFR: 72.35 mL/min (ref >60.00)
Glucose, Bld: 86 mg/dL (ref 70–99)
Potassium: 3.8 mEq/L (ref 3.5–5.1)
Sodium: 141 mEq/L (ref 135–145)

## 2021-02-21 LAB — TSH: TSH: 1.42 u[IU]/mL (ref 0.35–5.50)

## 2021-02-21 NOTE — Telephone Encounter (Signed)
Pt called in stating that the pregabalin is going to cost her $ 385.08 a mth. She states that she can't afford that and wanted to know if something else could be sent in.   Please advise   Pt can be reached at the home [facility] [place]

## 2021-02-22 LAB — VITAMIN D 25 HYDROXY (VIT D DEFICIENCY, FRACTURES): VITD: 20.14 ng/mL — ABNORMAL LOW (ref 30.00–100.00)

## 2021-02-22 MED ORDER — DULOXETINE HCL 20 MG PO CPEP: 20.0000 mg | ORAL_CAPSULE | Freq: Every day | ORAL | 3 refills | Status: DC

## 2021-02-22 NOTE — Telephone Encounter (Signed)
Prescription sent

## 2021-02-22 NOTE — Telephone Encounter (Signed)
Unfortunately, there are not a lot of good options for treating neuropathy.  We can try Cymbalta (Duloxetine) 20mg  daily as this carries an indication for nerve pain and also works on mood.  It is not sedating so there is no need to worry about that.  If this is something she wants to try, we can send in #30, 3 refills

## 2021-02-28 ENCOUNTER — Other Ambulatory Visit: Payer: Self-pay | Admitting: Family Medicine

## 2021-03-01 DIAGNOSIS — Z20822 Contact with and (suspected) exposure to covid-19: Secondary | ICD-10-CM | POA: Diagnosis not present

## 2021-03-02 DIAGNOSIS — Z20822 Contact with and (suspected) exposure to covid-19: Secondary | ICD-10-CM | POA: Diagnosis not present

## 2021-03-03 DIAGNOSIS — Z20822 Contact with and (suspected) exposure to covid-19: Secondary | ICD-10-CM | POA: Diagnosis not present

## 2021-03-07 DIAGNOSIS — Z20822 Contact with and (suspected) exposure to covid-19: Secondary | ICD-10-CM | POA: Diagnosis not present

## 2021-03-08 ENCOUNTER — Telehealth: Payer: Self-pay | Admitting: Family Medicine

## 2021-03-08 DIAGNOSIS — Z20822 Contact with and (suspected) exposure to covid-19: Secondary | ICD-10-CM | POA: Diagnosis not present

## 2021-03-08 NOTE — Telephone Encounter (Signed)
Pt called in stating that while she was reading up on the Duloxetine 20 mg information it stated that if you have Glaucome you shouldn't take the medications.  Please advise on what she should do.

## 2021-03-08 NOTE — Telephone Encounter (Signed)
Spoke to patient and made her aware, she understood. Had no other concerns at this time

## 2021-03-08 NOTE — Telephone Encounter (Signed)
According to Dr Prudencio Burly last note she has 'low tension glaucoma'.  This is a form of open angle glaucoma and should not be impacted by the Cymbalta (which impacts narrow angle glaucoma).  Given this information, it should be safe to take the Cymbalta but she can certainly clear this w/ Dr Prudencio Burly first.

## 2021-03-08 NOTE — Telephone Encounter (Signed)
Pt asking for advise as on warning for duloxetine it states not to use if you have glaucoma and pt would like to know how to proceed

## 2021-03-09 DIAGNOSIS — Z20822 Contact with and (suspected) exposure to covid-19: Secondary | ICD-10-CM | POA: Diagnosis not present

## 2021-03-10 ENCOUNTER — Telehealth: Payer: Self-pay | Admitting: Pharmacist

## 2021-03-10 NOTE — Progress Notes (Signed)
° ° °  Chronic Care Management Pharmacy Assistant   Name: Andrea Byrd  MRN: [id number] DOB: September 29, 1945   Reason for Encounter: Disease State - General Adherence Call     Recent office visits:  02/18/21 [name], MD - [place] were ordered. dexlansoprazole (DEXILANT) 60 MG capsule and  pregabalin (LYRICA) 50 MG capsule prescribed. Follow up in 6 months.   Recent consult visits:  None noted  Hospital visits:  None in previous 6 months  Medications: Outpatient Encounter Medications as of 03/10/2021  Medication Sig   DULoxetine (CYMBALTA) 20 MG capsule Take 1 capsule (20 mg total) by mouth daily.   aspirin EC 81 MG tablet Take 1 tablet (81 mg total) by mouth daily.   dexlansoprazole (DEXILANT) 60 MG capsule Take 1 capsule (60 mg total) by mouth daily.   furosemide (LASIX) 20 MG tablet Take 1 tablet (20 mg total) by mouth daily.   latanoprost (XALATAN) 0.005 % ophthalmic solution Place 1 drop into both eyes at bedtime.    losartan-hydrochlorothiazide (HYZAAR) 50-12.5 MG tablet TAKE 1 TABLET BY MOUTH EVERY DAY   pramipexole (MIRAPEX) 1 MG tablet TAKE 1 TABLET BY MOUTH TWICE A DAY   tiZANidine (ZANAFLEX) 4 MG tablet TAKE 1 TABLET (4 MG TOTAL) BY MOUTH 3 (THREE) TIMES DAILY AS NEEDED FOR MUSCLE SPASMS.   Vitamin D, Ergocalciferol, (DRISDOL) 1.25 MG (50000 UNIT) CAPS capsule Take 1 capsule (50,000 Units total) by mouth every 7 (seven) days.   No facility-administered encounter medications on file as of 03/10/2021.    Have you had any problems recently with your health? Patient denied any recent problems with his health.   Have you had any problems with your pharmacy? Patient denied any problems with her current pharmacy.  What issues or side effects are you having with your medications? Patient denied any side effects with her current medications.   What would you like me to pass along to [name], CPP for them to help you with?  Patient reported  she did receive Dexilant PAP and has returned to PCP to be sent in and will update with outcome.   What can we do to take care of you better? Patient did not have any recommendations at this time.   Care Gaps   AWV: done 05/03/20 Colonoscopy: done 04/12/11 DM Eye Exam: N/A DM Foot Exam: N/A Microalbumin: N/A HbgAIC: N/A DEXA: done 02/19/12 Mammogram: done 04/19/20   Star Rating Drugs: Losartan-hctz 50-12.5 mg daily - last filled 03/06/2021  90 days   Future Appointments  Date Time Provider [place]  05/02/2021 11:00 AM [name], [name], NP [facility] None  05/09/2021  8:15 AM [facility] HEALTH COACH [facility]  08/10/2021  2:15 PM [facility] PHARMACIST [facility]  08/19/2021  2:00 PM [name], MD [facility] [place], [facility] Clinical Pharmacist Assistant  [phone]

## 2021-03-23 ENCOUNTER — Other Ambulatory Visit: Payer: Self-pay | Admitting: Family Medicine

## 2021-03-23 DIAGNOSIS — Z1231 Encounter for screening mammogram for malignant neoplasm of breast: Secondary | ICD-10-CM

## 2021-03-26 ENCOUNTER — Other Ambulatory Visit: Payer: Self-pay | Admitting: Family Medicine

## 2021-04-03 ENCOUNTER — Other Ambulatory Visit: Payer: Self-pay | Admitting: Family Medicine

## 2021-04-20 ENCOUNTER — Telehealth: Payer: Self-pay | Admitting: Pharmacist

## 2021-04-20 NOTE — Progress Notes (Signed)
° ° °  Chronic Care Management Pharmacy Assistant   Name: TAM SAVOIA  MRN: [id number] DOB: 06/15/1945   Reason for Encounter: Disease State - Hypertension Call    Recent office visits:  None noted.   Recent consult visits:  None noted.   Hospital visits:  None in previous 6 months  Medications: Outpatient Encounter Medications as of 04/20/2021  Medication Sig   aspirin EC 81 MG tablet Take 1 tablet (81 mg total) by mouth daily.   dexlansoprazole (DEXILANT) 60 MG capsule Take 1 capsule (60 mg total) by mouth daily.   DULoxetine (CYMBALTA) 20 MG capsule TAKE 1 CAPSULE BY MOUTH EVERY DAY   furosemide (LASIX) 20 MG tablet Take 1 tablet (20 mg total) by mouth daily.   latanoprost (XALATAN) 0.005 % ophthalmic solution Place 1 drop into both eyes at bedtime.    losartan-hydrochlorothiazide (HYZAAR) 50-12.5 MG tablet TAKE 1 TABLET BY MOUTH EVERY DAY   pramipexole (MIRAPEX) 1 MG tablet TAKE 1 TABLET BY MOUTH TWICE A DAY   tiZANidine (ZANAFLEX) 4 MG tablet TAKE 1 TABLET (4 MG TOTAL) BY MOUTH 3 (THREE) TIMES DAILY AS NEEDED FOR MUSCLE SPASMS.   Vitamin D, Ergocalciferol, (DRISDOL) 1.25 MG (50000 UNIT) CAPS capsule Take 1 capsule (50,000 Units total) by mouth every 7 (seven) days.   No facility-administered encounter medications on file as of 04/20/2021.   Current antihypertensive regimen:  Losartan-HCTZ 50-12.5 mg once daily Furosemide 20 mg daily as needed   How often are you checking your Blood Pressure?  Patient reported checking blood pressures twice a week.    Current home BP readings: 156/88 (pre medicine)   What recent interventions/DTPs have been made by any provider to improve Blood Pressure control since last CPP Visit:  Patient denied any changes in medication regimen.    Any recent hospitalizations or ED visits since last visit with CPP?  Patient has not had any ED visits or hospitalizations since last visit with CPP.    What diet changes have been made to improve  Blood Pressure Control?  Patient reported she limits salt in her diet.    What exercise is being done to improve your Blood Pressure Control?  Patient reported she is active. She states she is the sole caregiver for her husband who is bedridden.     Adherence Review: Is the patient currently on ACE/ARB medication? Yes Does the patient have >5 day gap between last estimated fill dates? No   Care Gaps   AWV: done 05/03/20 Colonoscopy: done 04/12/11 DM Eye Exam: N/A DM Foot Exam: N/A Microalbumin: N/A HbgAIC: N/A DEXA: done 02/19/12 Mammogram: done 04/19/20   Star Rating Drugs: losartan-hydrochlorothiazide (HYZAAR) 50-12.5 MG tablet - last filled 03/06/21 90 days   Future Appointments  Date Time Provider [place]  04/26/2021 10:40 AM [facility]  05/02/2021 11:00 AM [name], [name], NP [facility] None  05/12/2021  8:15 AM [facility] HEALTH COACH [facility]  08/10/2021  2:15 PM [facility] CCM PHARMACIST [facility]  08/19/2021  2:00 PM [name], MD [facility] [place], [facility] Clinical Pharmacist Assistant  [phone]

## 2021-04-21 ENCOUNTER — Ambulatory Visit: Payer: Medicare HMO

## 2021-04-26 ENCOUNTER — Ambulatory Visit
Admission: RE | Admit: 2021-04-26 | Discharge: 2021-04-26 | Disposition: A | Discharge status: Home / Self Care | Payer: Medicare HMO | Source: Ambulatory Visit | Attending: Family Medicine | Admitting: Family Medicine

## 2021-04-26 DIAGNOSIS — Z1231 Encounter for screening mammogram for malignant neoplasm of breast: Secondary | ICD-10-CM | POA: Diagnosis not present

## 2021-04-26 DIAGNOSIS — M25511 Pain in right shoulder: Secondary | ICD-10-CM | POA: Diagnosis not present

## 2021-04-27 DIAGNOSIS — H401232 Low-tension glaucoma, bilateral, moderate stage: Secondary | ICD-10-CM | POA: Diagnosis not present

## 2021-04-29 DIAGNOSIS — M25511 Pain in right shoulder: Secondary | ICD-10-CM | POA: Insufficient documentation

## 2021-05-02 ENCOUNTER — Ambulatory Visit: Payer: Medicare HMO | Admitting: Family Medicine

## 2021-05-02 ENCOUNTER — Encounter: Payer: Self-pay | Admitting: Family Medicine

## 2021-05-02 ENCOUNTER — Other Ambulatory Visit: Payer: Self-pay | Admitting: Family Medicine

## 2021-05-02 VITALS — BP 152/82 | HR 87 | Ht 62.0 in | Wt 179.0 lb

## 2021-05-02 DIAGNOSIS — R208 Other disturbances of skin sensation: Secondary | ICD-10-CM

## 2021-05-02 DIAGNOSIS — G2581 Restless legs syndrome: Secondary | ICD-10-CM

## 2021-05-02 NOTE — Progress Notes (Signed)
PATIENT: Andrea Byrd DOB: [birth year]  REASON FOR VISIT: follow up HISTORY FROM: patient  Chief Complaint  Patient presents with   Follow-up    EMG RM 3, alone. C/o neuroaoathy being worse than RLS.     HISTORY OF PRESENT ILLNESS: 05/02/21 ALL: Andrea Byrd returns for follow up for RLS and dysesthesias. She was last seen 10/2019. She continues pramipexole 1mg  BID. Gabapentin was discontinued by PCP about 2-3 months ago. She did not feel it was helping. Now on duloxetine 20mg  daily. 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 She continues to have numbness/tingling and describes a cold sensation of both feet. No changes in gait. She did have a fall a couple of months ago after her sock got stuck on glue that was dropped on the floor. She reports not being able to feel the hot glue through her sock. She continues to provide total care for her husband who is now bed bound. He is now being seen by hospice. She has an aide that helps for a few hours three times a week.   10/08/2019 ALL:  [name] is a 76 y.o. female here today for follow up for RLS and neuropathic pain of both feet. She continues gabapentin 600mg  BID and pramipexole 1mg  BID. She reports symptoms are about the same. She does have sharp shooting pains of both feel regularly. She does not wish to change treatment plan at this time. She is selling her home and building a smaller home. She continues to provide care to her disabled husband. Her sister passed away last month. She is seen regularly by PCP who currently refills all medications.   HISTORY: (copied from my note on 10/07/2018)  Andrea Byrd is a 76 y.o. female here today for follow up for restless legs. She continues pramipexole 1mg  BID and gabapentin 600mg  BID. She does continue to have aching in bilateral legs at night. She feels that this is manageable and does not wish to increase/add medications. She stays busy as caregiver for  her husband post CVA and her sister who recently fell ill. She is tolerating medications well with no adverse effects noted.    HISTORY: (copied from Dr Gladstone Lighter note on 10/02/2017)   UPDATE (10/02/17, VRP): Since last visit, doing well. Symptoms are mild. Severity is mild. No alleviating or aggravating factors. Tolerating meds. (on side note, pt husband is in hospital with stroke, and planning to be d/c'd today).    UPDATE 08/29/16: Since last visit, RLS stable on gabapentin and pramipexole. Sharp pains in toes and feet slightly worse.    UPDATE 09/01/15: Since last visit, doing well. On gabapentin 600mg  BID + pramipexole 1mg  BID. Sxs well controlled. No side effects.    UPDATE 08/25/14: Since last visit, doing well. Has had chiro tx for back issues and feels better. On gabapentin 300mg  TID + pramipex 1mg  qhs. Satisfied with current dosing.    UPDATE 04/17/14: Since last visit, symptoms have continued. Numbness in feet, restless legs at night. On gabapentin 300mg  TID. PCP also tried her on pramipexole 0.25mg x3 at bedtime.    UPDATE 08/13/12: 76 year old right-handed female with history of hypertension, restless leg syndrome, here for evaluation of progressive numbness and tingling in her toes and feet. I previously saw this patient in October 2013 as referred by primary care physician. Now patient is referred to me by orthopedic surgery. Since last visit patient's symptoms have progressed. Now  she has numbness and tingling in her bilateral feet up to her ankles. She has intermittent sharp sensations as well. Symptoms are fairly symmetric. No symptoms in her upper legs, lower back, torso, arms or neck.   PRIOR HPI (12/18/11): 76 year old right-handed female with history of hypertension, this is a syndrome, here for evaluation of numbness in her feet for the past 3 months. Patient describes numbness and tingling in her toes and bottom of her feet. No significant pain. She also feels coldness in her feet.  No back pain or symptoms radiating from her back to her feet. No symptoms in her hands or fingers. No neck pain. Symptoms are mild and gradually progressing. 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 She did not have any numbness in the symptoms at that time.     REVIEW OF SYSTEMS: Out of a complete 14 system review of symptoms, the patient complains only of the following symptoms, restless legs, dysesthesias and all other reviewed systems are negative.   ALLERGIES: Allergies  Allergen Reactions   Nitrofurantoin     Makes mouth break out/blisters   Propoxyphene N-Acetaminophen     REACTION: nausea   Nexium [Esomeprazole Magnesium] Nausea And Vomiting   Nortriptyline Hcl Nausea And Vomiting   Hydrocodone Bit-Homatrop Mbr Itching and Rash   Morphine Itching and Rash    HOME MEDICATIONS: Outpatient Medications Prior to Visit  Medication Sig Dispense Refill   aspirin EC 81 MG tablet Take 1 tablet (81 mg total) by mouth daily. 90 tablet 0   dexlansoprazole (DEXILANT) 60 MG capsule Take 1 capsule (60 mg total) by mouth daily. 90 capsule 1   DULoxetine (CYMBALTA) 20 MG capsule TAKE 1 CAPSULE BY MOUTH EVERY DAY 90 capsule 2   furosemide (LASIX) 20 MG tablet Take 1 tablet (20 mg total) by mouth daily. 90 tablet 1   latanoprost (XALATAN) 0.005 % ophthalmic solution Place 1 drop into both eyes at bedtime.      losartan-hydrochlorothiazide (HYZAAR) 50-12.5 MG tablet TAKE 1 TABLET BY MOUTH EVERY DAY 90 tablet 1   pramipexole (MIRAPEX) 1 MG tablet TAKE 1 TABLET BY MOUTH TWICE A DAY 180 tablet 1   tiZANidine (ZANAFLEX) 4 MG tablet TAKE 1 TABLET (4 MG TOTAL) BY MOUTH 3 (THREE) TIMES DAILY AS NEEDED FOR MUSCLE SPASMS. 45 tablet 1   Vitamin D, Ergocalciferol, (DRISDOL) 1.25 MG (50000 UNIT) CAPS capsule Take 1 capsule (50,000 Units total) by mouth every 7 (seven) days. 12 capsule 0   No  facility-administered medications prior to visit.    PAST MEDICAL HISTORY: Past Medical History:  Diagnosis Date   Anemia    Arthritis    Cervical cancer ([place]) 1977   Diverticulosis    Endometriosis    Esophageal stricture    Gastritis    GERD (gastroesophageal reflux disease)    Glaucoma    Herpes simplex type 1 infection    Hiatal hernia    History of colon polyps    History of gallstones    Hypertension    IBS (irritable bowel syndrome)    Insomnia    Post-operative nausea and vomiting    Restless leg syndrome     PAST SURGICAL HISTORY: Past Surgical History:  Procedure Laterality Date   [place]   COLON RESECTION  2002   COLON  SURGERY  2003   removed 2 feet colon   ESOPHAGEAL MANOMETRY N/A 04/24/2016   Procedure: ESOPHAGEAL MANOMETRY (EM);  Surgeon: Mauri Pole, MD;  Location: [facility];  Service: Endoscopy;  Laterality: N/A;   HAND SURGERY     right/ thumb joints replaced both hands   OOPHORECTOMY     TUBAL LIGATION      FAMILY HISTORY: Family History  Problem Relation Age of Onset   Diabetes Mother    Stroke Mother        brother, sister, MGM   Colon polyps Mother    Heart disease Father    Kidney disease Father    Colon polyps Father    Lung disease Father    Kidney cancer Sister    Stroke Sister    Diabetes Sister    Colon polyps Sister    Celiac disease Other        niece   Heart attack Paternal Grandfather    Colon cancer Neg Hx    Esophageal cancer Neg Hx    Rectal cancer Neg Hx    Stomach cancer Neg Hx     SOCIAL HISTORY: Social History   Socioeconomic History   Marital status: Married    Spouse name: Not on file   Number of children: 1   Years of education: Not on file   Highest education level: Not on file  Occupational History   Occupation: Retired  Tobacco Use   Smoking status: Never   Smokeless tobacco: Never  Vaping Use    Vaping Use: Never used  Substance and Sexual Activity   Alcohol use: No   Drug use: No   Sexual activity: Not on file  Other Topics Concern   Not on file  Social History Narrative   Lives with husband floyd   Drinks maybe 4 sodas a week   Social Determinants of Health   Financial Resource Strain: Low Risk    Difficulty of Paying Living Expenses: Not hard at all  Food Insecurity: Not on file  Transportation Needs: Not on file  Physical Activity: Inactive   Days of Exercise per Week: 0 days   Minutes of Exercise per Session: 0 min  Stress: Stress Concern Present   Feeling of Stress : To some extent  Social Connections: Engineer, building services of Communication with Friends and Family: More than three times a week   Frequency of Social Gatherings with Friends and Family: More than three times a week   Attends Religious Services: More than 4 times per year   Active Member of [employer] or Organizations: Yes   Attends Archivist Meetings: 1 to 4 times per year   Marital Status: Married  Human resources officer Violence: Not At Risk   Fear of Current or Ex-Partner: No   Emotionally Abused: No   Physically Abused: No   Sexually Abused: No      PHYSICAL EXAM  Vitals:   05/02/21 1053  BP: (!) 152/82  Pulse: 87  Weight: 179 lb (81.2 kg)  Height: 5\' 2"  (1.575 m)    Body mass index is 32.74 kg/m.  Generalized: Well developed, in no acute distress  Cardiology: normal rate and rhythm, no murmur noted Respiratory: clear to auscultation bilaterally  Neurological examination  Mentation: Alert oriented to time, place, history taking. Follows all commands speech and language fluent Cranial nerve II-XII: Pupils were equal round reactive to light. Extraocular movements were full, visual field were full on confrontational test. Facial  sensation and strength were normal. Uvula tongue midline. Head turning and shoulder shrug  were normal and symmetric. Motor: The motor  testing reveals 5 over 5 strength of all 4 extremities. Good symmetric motor tone is noted throughout.  Sensory: Sensory testing is intact to soft touch on all 4 extremities with exception of decreased sensation to all toes bilaterally. No evidence of extinction is noted.  Coordination: Cerebellar testing reveals good finger-nose-finger and heel-to-shin bilaterally.  Gait and station: Gait is normal.    DIAGNOSTIC DATA (LABS, IMAGING, TESTING) - I reviewed patient records, labs, notes, testing and imaging myself where available.  MMSE - Mini Mental State Exam 09/26/2017  Orientation to time 5  Orientation to Place 5  Registration 3  Attention/ Calculation 5  Recall 3  Language- name 2 objects 2  Language- repeat 1  Language- follow 3 step command 3  Language- read & follow direction 1  Write a sentence 1  Copy design 1  Total score 30     Lab Results  Component Value Date   WBC 7.5 02/21/2021   HGB 12.7 02/21/2021   HCT 39.2 02/21/2021   MCV 87.6 02/21/2021   PLT 275.0 02/21/2021      Component Value Date/Time   NA 141 02/21/2021 1037   NA 141 10/11/2015 0000   K 3.8 02/21/2021 1037   CL 103 02/21/2021 1037   CO2 32 02/21/2021 1037   GLUCOSE 86 02/21/2021 1037   BUN 14 02/21/2021 1037   BUN 13 10/11/2015 0000   CREATININE 0.80 02/21/2021 1037   CREATININE 0.88 08/06/2017 1651   CALCIUM 9.5 02/21/2021 1037   PROT 6.2 02/21/2021 1037   ALBUMIN 4.0 02/21/2021 1037   AST 15 02/21/2021 1037   ALT 12 02/21/2021 1037   ALKPHOS 106 02/21/2021 1037   BILITOT 0.4 02/21/2021 1037   GFRNONAA >60 10/05/2017 1535   GFRAA >60 10/05/2017 1535   Lab Results  Component Value Date   CHOL 151 02/21/2021   HDL 49.30 02/21/2021   LDLCALC 84 02/21/2021   TRIG 90.0 02/21/2021   CHOLHDL 3 02/21/2021   No results found for: HGBA1C No results found for: VITAMINB12 Lab Results  Component Value Date   TSH 1.42 02/21/2021      ASSESSMENT AND PLAN 76 y.o. year old female  has  a past medical history of Anemia, Arthritis, Cervical cancer ([place]) (1977), Diverticulosis, Endometriosis, Esophageal stricture, Gastritis, GERD (gastroesophageal reflux disease), Glaucoma, Herpes simplex type 1 infection, Hiatal hernia, History of colon polyps, History of gallstones, Hypertension, IBS (irritable bowel syndrome), Insomnia, Post-operative nausea and vomiting, and Restless leg syndrome. here with     ICD-10-CM   1. Restless legs syndrome (RLS)  G25.81     2. Dysesthesia  R20.8        Magalie reports that RLS is stable, however, neuropathy has worsened. Gabpentin was not helpful. Duloxetine is well tolerated but minimally effective. She is very hesitant to increase dose and does not wish to add any medicaitons that could cause sedative side effects. She is most comfortable with topical options. We have discussed effectiveness of capsaicin and lidocaine for temporary relief. Alpha lipoic acid may be beneficial. I will attempt to get a compounded neuropathy cream covered for her to try. She is aware rx is being sent to [facility]. She was encouraged to continue focusing on healthy lifestyle habits. She will follow up with me in 6 months. May follow up as needed if OTC treatments work.  She verbalizes  understanding and agreement with this plan.    No orders of the defined types were placed in this encounter.    No orders of the defined types were placed in this encounter.     Debbora Presto, FNP-C 05/02/2021, 2:17 PM [facility] [address], [place], [place] [address] [phone]

## 2021-05-02 NOTE — Patient Instructions (Signed)
Below is our plan:  We will continue to monitor symptoms. Please continue close follow up with Dr Birdie Riddle. Capsaicin could be a good option to try at night. You can get capsaicin patches at any pharmacy. You could also try lidocaine cream. Alpha lipoic acid supplements have also shown to be effective. All of these medicaitons are available with the pharmacy. I will try to get topical compounded neuropathy cream. Listen out for a phone call from [facility].   Please make sure you are staying well hydrated. I recommend 50-60 ounces daily. Well balanced diet and regular exercise encouraged. Consistent sleep schedule with 6-8 hours recommended.   Please continue follow up with care team as directed.   Follow up with me in 6 months if no improvement.   You may receive a survey regarding today's visit. I encourage you to leave honest feed back as I do use this information to improve patient care. Thank you for seeing me today!

## 2021-05-09 ENCOUNTER — Ambulatory Visit: Payer: Medicare HMO

## 2021-05-12 ENCOUNTER — Ambulatory Visit (INDEPENDENT_AMBULATORY_CARE_PROVIDER_SITE_OTHER): Payer: Medicare HMO

## 2021-05-12 DIAGNOSIS — Z0000 Encounter for general adult medical examination without abnormal findings: Secondary | ICD-10-CM

## 2021-05-12 DIAGNOSIS — Z780 Asymptomatic menopausal state: Secondary | ICD-10-CM

## 2021-05-12 NOTE — Progress Notes (Signed)
Subjective:   Andrea Byrd is a 76 y.o. female who presents for Medicare Annual (Subsequent) preventive examination.  I connected with Andrea Byrd today by telephone and verified that I am speaking with the correct person using two identifiers. Location patient: home Location provider: work Persons participating in the virtual visit: patient, provider.   I discussed the limitations, risks, security and privacy concerns of performing an evaluation and management service by telephone and the availability of in person appointments. I also discussed with the patient that there may be a patient responsible charge related to this service. The patient expressed understanding and verbally consented to this telephonic visit.    Interactive audio and video telecommunications were attempted between this provider and patient, however failed, due to patient having technical difficulties OR patient did not have access to video capability.  We continued and completed visit with audio only.    Review of Systems     Cardiac Risk Factors include: advanced age (>21mn, >>105women);hypertension     Objective:    Today's Vitals   There is no height or weight on file to calculate BMI.  Advanced Directives 05/12/2021 05/03/2020 09/26/2017 08/30/2016 08/25/2014  Does Patient Have a Medical Advance Directive? Yes Yes Yes Yes Yes  Type of AParamedicof [place]Living will [place] will [place] PineLiving will Living will;Healthcare Power of Attorney Living will;Healthcare Power of [place] Chart? No - copy requested No - copy requested No - copy requested No - copy requested No - copy requested    Current Medications (verified) Outpatient Encounter Medications as of 05/12/2021  Medication Sig   aspirin EC 81 MG tablet Take 1 tablet (81 mg total) by mouth daily.   dexlansoprazole (DEXILANT) 60 MG capsule  Take 1 capsule (60 mg total) by mouth daily.   DULoxetine (CYMBALTA) 20 MG capsule TAKE 1 CAPSULE BY MOUTH EVERY DAY   furosemide (LASIX) 20 MG tablet Take 1 tablet (20 mg total) by mouth daily.   latanoprost (XALATAN) 0.005 % ophthalmic solution Place 1 drop into both eyes at bedtime.    losartan-hydrochlorothiazide (HYZAAR) 50-12.5 MG tablet TAKE 1 TABLET BY MOUTH EVERY DAY   pramipexole (MIRAPEX) 1 MG tablet TAKE 1 TABLET BY MOUTH TWICE A DAY   tiZANidine (ZANAFLEX) 4 MG tablet TAKE 1 TABLET (4 MG TOTAL) BY MOUTH 3 (THREE) TIMES DAILY AS NEEDED FOR MUSCLE SPASMS.   Vitamin D, Ergocalciferol, (DRISDOL) 1.25 MG (50000 UNIT) CAPS capsule Take 1 capsule (50,000 Units total) by mouth every 7 (seven) days.   No facility-administered encounter medications on file as of 05/12/2021.    Allergies (verified) Nitrofurantoin, Propoxyphene n-acetaminophen, Nexium [esomeprazole magnesium], Nortriptyline hcl, Hydrocodone bit-homatrop mbr, and Morphine   History: Past Medical History:  Diagnosis Date   Anemia    Arthritis    [place]   Diverticulosis    Endometriosis    Esophageal stricture    Gastritis    GERD (gastroesophageal reflux disease)    Glaucoma    Herpes simplex type 1 infection    Hiatal hernia    History of colon polyps    History of gallstones    Hypertension    IBS (irritable bowel syndrome)    Insomnia    Post-operative nausea and vomiting    Restless leg syndrome    Past Surgical History:  [place]cedure [place] Date   [place]   COLON RESECTION  2002   COLON SURGERY  2003   removed 2 feet colon   ESOPHAGEAL MANOMETRY N/A 04/24/2016   Procedure: ESOPHAGEAL MANOMETRY (EM);  Surgeon: Mauri Pole, MD;  Location: [facility];  Service: Endoscopy;  Laterality: N/A;   HAND SURGERY     right/ thumb joints replaced both hands   OOPHORECTOMY     TUBAL LIGATION      Family History  Problem Relation Age of Onset   Diabetes Mother    Stroke Mother        brother, sister, MGM   Colon polyps Mother    Heart disease Father    Kidney disease Father    Colon polyps Father    Lung disease Father    Kidney cancer Sister    Stroke Sister    Diabetes Sister    Colon polyps Sister    Celiac disease Other        niece   Heart attack Paternal Grandfather    Colon cancer Neg Hx    Esophageal cancer Neg Hx    Rectal cancer Neg Hx    Stomach cancer Neg Hx    Social History   Socioeconomic History   Marital status: Married    Spouse name: Not on file   Number of children: 1   Years of education: Not on file   Highest education level: Not on file  Occupational History   Occupation: Retired  Tobacco Use   Smoking status: Never   Smokeless tobacco: Never  Vaping Use   Vaping Use: Never used  Substance and Sexual Activity   Alcohol use: No   Drug use: No   Sexual activity: Not on file  Other Topics Concern   Not on file  Social History Narrative   Lives with husband floyd   Drinks maybe 4 sodas a week   Social Determinants of Health   Financial Resource Strain: Low Risk    Difficulty of Paying Living Expenses: Not hard at all  Food Insecurity: No Food Insecurity   Worried About Charity fundraiser in the Last Year: Never true   Arboriculturist in the Last Year: Never true  Transportation Needs: No Transportation Needs   Lack of Transportation (Medical): No   Lack of Transportation (Non-Medical): No  Physical Activity: Insufficiently Active   Days of Exercise per Week: 2 days   Minutes of Exercise per Session: 30 min  Stress: No Stress Concern Present   Feeling of Stress : Not at all  Social Connections: Moderately Integrated   Frequency of Communication with Friends and Family: Twice a week   Frequency of Social Gatherings with Friends and Family: Twice a week   Attends Religious Services: 1 to 4 times per year   Active Member of  [employer] or Organizations: No   Attends Music therapist: Never   Marital Status: Married    Tobacco Counseling Counseling given: Not Answered   Clinical Intake:  Pre-visit preparation completed: Yes  Pain : No/denies pain     Nutritional Risks: None Diabetes: No  How often do you need to have someone help you when you read instructions, pamphlets, or other written materials from your doctor or pharmacy?: 1 - Never What is the last grade level you completed in school?: college  Diabetic?no  Interpreter Needed?: No  Information entered by :: [place]  of Daily Living In your present state of health, do you have any difficulty performing the following activities: 05/12/2021 05/13/2020  Hearing? N N  Vision? N N  Difficulty concentrating or making decisions? N N  Walking or climbing stairs? N N  Dressing or bathing? N N  Doing errands, shopping? N N  Preparing Food and eating ? N -  Using the Toilet? N -  In the past six months, have you accidently leaked urine? N -  Do you have problems with loss of bowel control? N -  Managing your Medications? N -  Managing your Finances? N -  Housekeeping or managing your Housekeeping? N -  Some recent data might be hidden    Patient Care Team: [name], MD as PCP - General (Family Medicine) Mauri Pole, MD as Consulting Physician (Gastroenterology) Penni Bombard, MD as Consulting Physician (Neurology) Harriett Sine, MD as Consulting Physician (Dermatology) [facility] (Specialist) Edythe Clarity, [facility] (Pharmacist)  Indicate any recent Medical Services you may have received from other than [facility] providers in the past year (date may be approximate).     Assessment:   This is a routine wellness examination for Andrea Byrd.  Hearing/Vision screen Vision Screening - Comments:: Annual eye exams   Dietary issues and exercise activities discussed: Current Exercise Habits: Home  exercise routine, Type of exercise: walking, Time (Minutes): 30, Frequency (Times/Week): 3, Weekly Exercise (Minutes/Week): 90, Intensity: Mild, Exercise limited by: orthopedic condition(s)   Goals Addressed             This Visit's Progress    Increase physical activity   On track    Stay active by gardening and yard maintenance.        Depression Screen PHQ 2/9 Scores 05/12/2021 02/18/2021 05/13/2020 05/03/2020 11/14/2019 08/14/2019 02/04/2019  PHQ - 2 Score 0 0 0 '1 3 2 '$ 0  PHQ- 9 Score - 8 0 - 12 2 0    Fall Risk Fall Risk  05/12/2021 02/18/2021 05/13/2020 05/03/2020 11/14/2019  Falls in the past year? 0 1 0 1 0  Comment - - - - -  Number falls in past yr: 0 1 0 0 0  Injury with Fall? 0 0 0 1 1  Risk for fall due to : No Fall Risks History of fall(s) No Fall Risks History of fall(s) -  Follow up Falls evaluation completed;Education provided Falls evaluation completed - Falls prevention discussed Falls evaluation completed    FALL RISK PREVENTION PERTAINING TO THE HOME:  Any stairs in or around the home? No  If so, are there any without handrails? No  Home free of loose throw rugs in walkways, pet beds, electrical cords, etc? Yes  Adequate lighting in your home to reduce risk of falls? Yes   ASSISTIVE DEVICES UTILIZED TO PREVENT FALLS:  Life alert? No  Use of a cane, walker or w/c? No  Grab bars in the bathroom? No  Shower chair or bench in shower? No  Elevated toilet seat or a handicapped toilet? No    Cognitive Function: Normal cognitive status assessed by direct observation by this Nurse Health Advisor. No abnormalities found.   MMSE - Mini Mental State Exam 09/26/2017  Orientation to time 5  Orientation to Place 5  Registration 3  Attention/ Calculation 5  Recall 3  Language- name 2 objects 2  Language- repeat 1  Language- follow 3 step command 3  Language- read & follow direction 1  Write a sentence 1  Copy design 1  Total score 30         Immunizations Immunization History  Administered Date(s) Administered   Fluad Quad(high Dose 65+) 11/10/2018, 12/28/2019, 12/24/2020   Influenza, High Dose Seasonal PF 01/11/2018   Influenza,inj,Quad PF,6+ Mos 12/23/2014, 10/24/2015, 11/14/2016   Moderna Sars-Covid-2 Vaccination 04/11/2019, 05/10/2019, 03/03/2020   Pneumococcal Conjugate-13 09/09/2013   Pneumococcal Polysaccharide-23 12/20/2011   Tdap 07/28/2005    TDAP status: Due, Education has been provided regarding the importance of this vaccine. Advised may receive this vaccine at local pharmacy or Health Dept. Aware to provide a copy of the vaccination record if obtained from local pharmacy or Health Dept. Verbalized acceptance and understanding.  Flu Vaccine status: Due, Education has been provided regarding the importance of this vaccine. Advised may receive this vaccine at local pharmacy or Health Dept. Aware to provide a copy of the vaccination record if obtained from local pharmacy or Health Dept. Verbalized acceptance and understanding.  Pneumococcal vaccine status: Due, Education has been provided regarding the importance of this vaccine. Advised may receive this vaccine at local pharmacy or Health Dept. Aware to provide a copy of the vaccination record if obtained from local pharmacy or Health Dept. Verbalized acceptance and understanding.  Covid-19 vaccine status: Completed vaccines  Qualifies for Shingles Vaccine? Yes   Zostavax completed No   Shingrix Completed?: No.    Education has been provided regarding the importance of this vaccine. Patient has been advised to call insurance company to determine out of pocket expense if they have not yet received this vaccine. Advised may also receive vaccine at local pharmacy or Health Dept. Verbalized acceptance and understanding.  Screening Tests Health Maintenance  Topic Date Due   Hepatitis C Screening  Never done   Zoster Vaccines- Shingrix (1 of 2) Never done    TETANUS/TDAP  07/29/2015   COVID-19 Vaccine (4 - Booster for Moderna series) 04/28/2020   COLONOSCOPY (Pts 45-34yr Insurance coverage will need to be confirmed)  02/08/2022   Pneumonia Vaccine 75 Years old  Completed   INFLUENZA VACCINE  Completed   DEXA SCAN  Completed   HPV VACCINES  Aged Out    Health Maintenance  Health Maintenance Due  Topic Date Due   Hepatitis C Screening  Never done   Zoster Vaccines- Shingrix (1 of 2) Never done   TETANUS/TDAP  07/29/2015   COVID-19 Vaccine (4 - Booster for Moderna series) 04/28/2020    Colorectal cancer screening: No longer required.   Mammogram status: No longer required due to age .  Bone Density status: Ordered 05/12/2021. Pt provided with contact info and advised to call to schedule appt.  Lung Cancer Screening: (Low Dose CT Chest recommended if Age 969-80years, 30 pack-year currently smoking OR have quit w/in 15years.) does not qualify.   Lung Cancer Screening Referral: n/a  Additional Screening:  Hepatitis C Screening: does qualify;   Vision Screening: Recommended annual ophthalmology exams for early detection of glaucoma and other disorders of the eye. Is the patient up to date with their annual eye exam?  Yes  Who is the provider or what is the name of the office in which the patient attends annual eye exams? Dr.Lyles  If pt is not established with a provider, would they like to be referred to a provider to establish care? No .   Dental Screening: Recommended annual dental exams for proper oral hygiene  Community Resource Referral / Chronic Care Management: CRR required this visit?  No  CCM required this visit?  No      Plan:     I have personally reviewed and noted the following in the patients chart:   Medical and social history Use of alcohol, tobacco or illicit drugs  Current medications and supplements including opioid prescriptions.  Functional ability and status Nutritional status Physical  activity Advanced directives List of other physicians Hospitalizations, surgeries, and ER visits in previous 12 months Vitals Screenings to include cognitive, depression, and falls Referrals and appointments  In addition, I have reviewed and discussed with patient certain preventive protocols, quality metrics, and best practice recommendations. A written personalized care plan for preventive services as well as general preventive health recommendations were provided to patient.     Randel Pigg, LPN   05/07/3733   Nurse Notes: none

## 2021-05-12 NOTE — Patient Instructions (Signed)
Andrea Byrd , Thank you for taking time to come for your Medicare Wellness Visit. I appreciate your ongoing commitment to your health goals. Please review the following plan we discussed and let me know if I can assist you in the future.   Screening recommendations/referrals: Colonoscopy: no longer required  Mammogram: no longer required  Bone Density: referral 05/12/2021 Recommended yearly ophthalmology/optometry visit for glaucoma screening and checkup Recommended yearly dental visit for hygiene and checkup  Vaccinations: Influenza vaccine: due  Pneumococcal vaccine:completed  Tdap vaccine: due  Shingles vaccine: will consider     Advanced directives: yes   Conditions/risks identified: none   Next appointment: none    Preventive Care 75 Years and Older, Female Preventive care refers to lifestyle choices and visits with your health care provider that can promote health and wellness. What does preventive care include? A yearly physical exam. This is also called an annual well check. Dental exams once or twice a year. Routine eye exams. Ask your health care provider how often you should have your eyes checked. Personal lifestyle choices, including: Daily care of your teeth and gums. Regular physical activity. Eating a healthy diet. Avoiding tobacco and drug use. Limiting alcohol use. Practicing safe sex. Taking low-dose aspirin every day. Taking vitamin and mineral supplements as recommended by your health care provider. What happens during an annual well check? The services and screenings done by your health care provider during your annual well check will depend on your age, overall health, lifestyle risk factors, and family history of disease. Counseling  Your health care provider may ask you questions about your: Alcohol use. Tobacco use. Drug use. Emotional well-being. Home and relationship well-being. Sexual activity. Eating habits. History of falls. Memory and  ability to understand (cognition). Work and work Statistician. Reproductive health. Screening  You may have the following tests or measurements: Height, weight, and BMI. Blood pressure. Lipid and cholesterol levels. These may be checked every 5 years, or more frequently if you are over 62 years old. Skin check. Lung cancer screening. You may have this screening every year starting at age 75 if you have a 30-pack-year history of smoking and currently smoke or have quit within the past 15 years. Fecal occult blood test (FOBT) of the stool. You may have this test every year starting at age 75. Flexible sigmoidoscopy or colonoscopy. You may have a sigmoidoscopy every 5 years or a colonoscopy every 10 years starting at age 75. Hepatitis C blood test. Hepatitis B blood test. Sexually transmitted disease (STD) testing. Diabetes screening. This is done by checking your blood sugar (glucose) after you have not eaten for a while (fasting). You may have this done every 1-3 years. Bone density scan. This is done to screen for osteoporosis. You may have this done starting at age 65. Mammogram. This may be done every 1-2 years. Talk to your health care provider about how often you should have regular mammograms. Talk with your health care provider about your test results, treatment options, and if necessary, the need for more tests. Vaccines  Your health care provider may recommend certain vaccines, such as: Influenza vaccine. This is recommended every year. Tetanus, diphtheria, and acellular pertussis (Tdap, Td) vaccine. You may need a Td booster every 10 years. Zoster vaccine. You may need this after age 75. Pneumococcal 13-valent conjugate (PCV13) vaccine. One dose is recommended after age 65. Pneumococcal polysaccharide (PPSV23) vaccine. One dose is recommended after age 75. Talk to your health care provider about which screenings  and vaccines you need and how often you need them. This information is  not intended to replace advice given to you by your health care provider. Make sure you discuss any questions you have with your health care provider. Document Released: 03/19/2015 Document Revised: 11/10/2015 Document Reviewed: 12/22/2014 Elsevier Interactive Patient Education  2017 [employer] Prevention in the Home Falls can cause injuries. They can happen to people of all ages. There are many things you can do to make your home safe and to help prevent falls. What can I do on the outside of my home? Regularly fix the edges of walkways and driveways and fix any cracks. Remove anything that might make you trip as you walk through a door, such as a raised step or threshold. Trim any bushes or trees on the path to your home. Use bright outdoor lighting. Clear any walking paths of anything that might make someone trip, such as rocks or tools. Regularly check to see if handrails are loose or broken. Make sure that both sides of any steps have handrails. Any raised decks and porches should have guardrails on the edges. Have any leaves, snow, or ice cleared regularly. Use sand or salt on walking paths during winter. Clean up any spills in your garage right away. This includes oil or grease spills. What can I do in the bathroom? Use night lights. Install grab bars by the toilet and in the tub and shower. Do not use towel bars as grab bars. Use non-skid mats or decals in the tub or shower. If you need to sit down in the shower, use a plastic, non-slip stool. Keep the floor dry. Clean up any water that spills on the floor as soon as it happens. Remove soap buildup in the tub or shower regularly. Attach bath mats securely with double-sided non-slip rug tape. Do not have throw rugs and other things on the floor that can make you trip. What can I do in the bedroom? Use night lights. Make sure that you have a light by your bed that is easy to reach. Do not use any sheets or blankets that  are too big for your bed. They should not hang down onto the floor. Have a firm chair that has side arms. You can use this for support while you get dressed. Do not have throw rugs and other things on the floor that can make you trip. What can I do in the kitchen? Clean up any spills right away. Avoid walking on wet floors. Keep items that you use a lot in easy-to-reach places. If you need to reach something above you, use a strong step stool that has a grab bar. Keep electrical cords out of the way. Do not use floor polish or wax that makes floors slippery. If you must use wax, use non-skid floor wax. Do not have throw rugs and other things on the floor that can make you trip. What can I do with my stairs? Do not leave any items on the stairs. Make sure that there are handrails on both sides of the stairs and use them. Fix handrails that are broken or loose. Make sure that handrails are as long as the stairways. Check any carpeting to make sure that it is firmly attached to the stairs. Fix any carpet that is loose or worn. Avoid having throw rugs at the top or bottom of the stairs. If you do have throw rugs, attach them to the floor with carpet tape.  Make sure that you have a light switch at the top of the stairs and the bottom of the stairs. If you do not have them, ask someone to add them for you. What else can I do to help prevent falls? Wear shoes that: Do not have high heels. Have rubber bottoms. Are comfortable and fit you well. Are closed at the toe. Do not wear sandals. If you use a stepladder: Make sure that it is fully opened. Do not climb a closed stepladder. Make sure that both sides of the stepladder are locked into place. Ask someone to hold it for you, if possible. Clearly mark and make sure that you can see: Any grab bars or handrails. First and last steps. Where the edge of each step is. Use tools that help you move around (mobility aids) if they are needed. These  include: Canes. Walkers. Scooters. Crutches. Turn on the lights when you go into a dark area. Replace any light bulbs as soon as they burn out. Set up your furniture so you have a clear path. Avoid moving your furniture around. If any of your floors are uneven, fix them. If there are any pets around you, be aware of where they are. Review your medicines with your doctor. Some medicines can make you feel dizzy. This can increase your chance of falling. Ask your doctor what other things that you can do to help prevent falls. This information is not intended to replace advice given to you by your health care provider. Make sure you discuss any questions you have with your health care provider. Document Released: 12/17/2008 Document Revised: 07/29/2015 Document Reviewed: 03/27/2014 Elsevier Interactive Patient Education  2017 [employer].

## 2021-05-23 DIAGNOSIS — M25511 Pain in right shoulder: Secondary | ICD-10-CM | POA: Diagnosis not present

## 2021-05-27 ENCOUNTER — Telehealth: Payer: Self-pay

## 2021-05-27 DIAGNOSIS — F4321 Adjustment disorder with depressed mood: Secondary | ICD-10-CM

## 2021-05-27 DIAGNOSIS — F5104 Psychophysiologic insomnia: Secondary | ICD-10-CM

## 2021-05-27 MED ORDER — CLONAZEPAM 0.5 MG PO TABS: 0.2500 mg | ORAL_TABLET | Freq: Two times a day (BID) | ORAL | 0 refills | Status: DC | PRN

## 2021-05-27 NOTE — Telephone Encounter (Signed)
Called patient. Husband passed late yesterday afternoon. Trouble sleeping last night. Has family and church support during day. Did not try any otc meds for sleep last night. Condolences given, and advised I will forward message to Dr. Birdie Riddle to make her aware. Short term klonopin provided for sleep or anxiety. Side effects discussed, lowest effective dose. No current therapist. Has family/church support. Advised to let us know if we can help in this difficult time. Appreciated the call and assistance.  ?

## 2021-05-27 NOTE — Telephone Encounter (Signed)
Patient called in upset stating she just lost her husband yesterday. Andrea Byrd says she has been trying to process everything and is just wanting to know if there is something she can be prescribed.  ?

## 2021-05-30 ENCOUNTER — Encounter: Payer: Self-pay | Admitting: Family Medicine

## 2021-06-10 ENCOUNTER — Other Ambulatory Visit: Payer: Self-pay | Admitting: Family Medicine

## 2021-07-05 ENCOUNTER — Other Ambulatory Visit: Payer: Self-pay | Admitting: Family Medicine

## 2021-07-08 DIAGNOSIS — M2041 Other hammer toe(s) (acquired), right foot: Secondary | ICD-10-CM | POA: Diagnosis not present

## 2021-07-08 DIAGNOSIS — M79671 Pain in right foot: Secondary | ICD-10-CM | POA: Diagnosis not present

## 2021-07-08 DIAGNOSIS — M21619 Bunion of unspecified foot: Secondary | ICD-10-CM | POA: Diagnosis not present

## 2021-07-08 DIAGNOSIS — M21621 Bunionette of right foot: Secondary | ICD-10-CM | POA: Insufficient documentation

## 2021-07-08 DIAGNOSIS — M79672 Pain in left foot: Secondary | ICD-10-CM | POA: Diagnosis not present

## 2021-07-08 DIAGNOSIS — M7741 Metatarsalgia, right foot: Secondary | ICD-10-CM | POA: Diagnosis not present

## 2021-07-21 ENCOUNTER — Other Ambulatory Visit: Payer: Self-pay | Admitting: Family Medicine

## 2021-07-21 DIAGNOSIS — E876 Hypokalemia: Secondary | ICD-10-CM

## 2021-08-04 DIAGNOSIS — I10 Essential (primary) hypertension: Secondary | ICD-10-CM | POA: Diagnosis not present

## 2021-08-04 DIAGNOSIS — E559 Vitamin D deficiency, unspecified: Secondary | ICD-10-CM | POA: Diagnosis not present

## 2021-08-04 DIAGNOSIS — Z6831 Body mass index (BMI) 31.0-31.9, adult: Secondary | ICD-10-CM | POA: Diagnosis not present

## 2021-08-04 DIAGNOSIS — Z604 Social exclusion and rejection: Secondary | ICD-10-CM | POA: Diagnosis not present

## 2021-08-04 DIAGNOSIS — K2100 Gastro-esophageal reflux disease with esophagitis, without bleeding: Secondary | ICD-10-CM | POA: Diagnosis not present

## 2021-08-04 DIAGNOSIS — R32 Unspecified urinary incontinence: Secondary | ICD-10-CM | POA: Diagnosis not present

## 2021-08-04 DIAGNOSIS — R69 Illness, unspecified: Secondary | ICD-10-CM | POA: Diagnosis not present

## 2021-08-04 DIAGNOSIS — G629 Polyneuropathy, unspecified: Secondary | ICD-10-CM | POA: Diagnosis not present

## 2021-08-04 DIAGNOSIS — Z791 Long term (current) use of non-steroidal anti-inflammatories (NSAID): Secondary | ICD-10-CM | POA: Diagnosis not present

## 2021-08-04 DIAGNOSIS — E669 Obesity, unspecified: Secondary | ICD-10-CM | POA: Diagnosis not present

## 2021-08-04 DIAGNOSIS — G2581 Restless legs syndrome: Secondary | ICD-10-CM | POA: Diagnosis not present

## 2021-08-04 DIAGNOSIS — H409 Unspecified glaucoma: Secondary | ICD-10-CM | POA: Diagnosis not present

## 2021-08-05 ENCOUNTER — Other Ambulatory Visit (HOSPITAL_COMMUNITY): Payer: Self-pay | Admitting: Orthopedic Surgery

## 2021-08-10 ENCOUNTER — Telehealth: Payer: Medicare HMO

## 2021-08-11 ENCOUNTER — Other Ambulatory Visit: Payer: Self-pay | Admitting: Family Medicine

## 2021-08-11 DIAGNOSIS — F4321 Adjustment disorder with depressed mood: Secondary | ICD-10-CM

## 2021-08-11 DIAGNOSIS — F5104 Psychophysiologic insomnia: Secondary | ICD-10-CM

## 2021-08-11 NOTE — Telephone Encounter (Signed)
Patient is requesting a refill of the following medications: Requested Prescriptions   Pending Prescriptions Disp Refills   clonazePAM (KLONOPIN) 0.5 MG tablet [Pharmacy Med Name: CLONAZEPAM 0.5 MG TABLET] 15 tablet 0    Sig: TAKE 1/2 TO 1 TABLET BY MOUTH 2 (TWO) TIMES DAILY AS NEEDED FOR ANXIETY (OR SLEEP).    Date of patient request: 08/11/21 Last office visit: 02/18/21 Date of last refill: 05/07/21 Last refill amount: 15

## 2021-08-17 ENCOUNTER — Other Ambulatory Visit: Payer: Self-pay | Admitting: Family Medicine

## 2021-08-19 ENCOUNTER — Encounter: Payer: Self-pay | Admitting: Family Medicine

## 2021-08-19 ENCOUNTER — Ambulatory Visit (INDEPENDENT_AMBULATORY_CARE_PROVIDER_SITE_OTHER): Payer: Medicare HMO | Admitting: Family Medicine

## 2021-08-19 VITALS — BP 118/80 | HR 78 | Temp 97.6°F | Resp 16 | Ht 62.0 in | Wt 181.0 lb

## 2021-08-19 DIAGNOSIS — I10 Essential (primary) hypertension: Secondary | ICD-10-CM | POA: Diagnosis not present

## 2021-08-19 DIAGNOSIS — F4323 Adjustment disorder with mixed anxiety and depressed mood: Secondary | ICD-10-CM | POA: Diagnosis not present

## 2021-08-19 DIAGNOSIS — R69 Illness, unspecified: Secondary | ICD-10-CM | POA: Diagnosis not present

## 2021-08-19 DIAGNOSIS — R195 Other fecal abnormalities: Secondary | ICD-10-CM

## 2021-08-19 DIAGNOSIS — R1310 Dysphagia, unspecified: Secondary | ICD-10-CM

## 2021-08-19 DIAGNOSIS — E669 Obesity, unspecified: Secondary | ICD-10-CM

## 2021-08-19 LAB — CBC WITH DIFFERENTIAL/PLATELET
Basophils Absolute: 0.1 10*3/uL (ref 0.0–0.1)
Basophils Relative: 0.6 % (ref 0.0–3.0)
Eosinophils Absolute: 0.1 10*3/uL (ref 0.0–0.7)
Eosinophils Relative: 1.3 % (ref 0.0–5.0)
HCT: 38.5 % (ref 36.0–46.0)
Hemoglobin: 12.9 g/dL (ref 12.0–15.0)
Lymphocytes Relative: 20.4 % (ref 12.0–46.0)
Lymphs Abs: 1.6 10*3/uL (ref 0.7–4.0)
MCHC: 33.5 g/dL (ref 30.0–36.0)
MCV: 86.7 fl (ref 78.0–100.0)
Monocytes Absolute: 0.5 10*3/uL (ref 0.1–1.0)
Monocytes Relative: 6.1 % (ref 3.0–12.0)
Neutro Abs: 5.8 10*3/uL (ref 1.4–7.7)
Neutrophils Relative %: 71.6 % (ref 43.0–77.0)
Platelets: 282 10*3/uL (ref 150.0–400.0)
RBC: 4.44 Mil/uL (ref 3.87–5.11)
RDW: 14.7 % (ref 11.5–15.5)
WBC: 8.1 10*3/uL (ref 4.0–10.5)

## 2021-08-19 LAB — LIPID PANEL
Cholesterol: 167 mg/dL (ref 0–200)
HDL: 47.7 mg/dL (ref >39.00)
LDL Cholesterol: 93 mg/dL (ref 0–99)
NonHDL: 118.86
Total CHOL/HDL Ratio: 3
Triglycerides: 128 mg/dL (ref 0.0–149.0)
VLDL: 25.6 mg/dL (ref 0.0–40.0)

## 2021-08-19 LAB — BASIC METABOLIC PANEL
BUN: 15 mg/dL (ref 6–23)
CO2: 30 mEq/L (ref 19–32)
Calcium: 10.1 mg/dL (ref 8.4–10.5)
Chloride: 102 mEq/L (ref 96–112)
Creatinine, Ser: 0.8 mg/dL (ref 0.40–1.20)
GFR: 72.11 mL/min (ref >60.00)
Glucose, Bld: 87 mg/dL (ref 70–99)
Potassium: 4.1 mEq/L (ref 3.5–5.1)
Sodium: 141 mEq/L (ref 135–145)

## 2021-08-19 LAB — HEPATIC FUNCTION PANEL
ALT: 15 U/L (ref 0–35)
AST: 20 U/L (ref 0–37)
Albumin: 4.3 g/dL (ref 3.5–5.2)
Alkaline Phosphatase: 100 U/L (ref 39–117)
Bilirubin, Direct: 0.1 mg/dL (ref 0.0–0.3)
Total Bilirubin: 0.4 mg/dL (ref 0.2–1.2)
Total Protein: 7.1 g/dL (ref 6.0–8.3)

## 2021-08-19 LAB — HEMOGLOBIN A1C: Hgb A1c MFr Bld: 5.6 % (ref 4.6–6.5)

## 2021-08-19 LAB — TSH: TSH: 1.33 u[IU]/mL (ref 0.35–5.50)

## 2021-08-19 MED ORDER — FLUOXETINE HCL 20 MG PO CAPS: 20.0000 mg | ORAL_CAPSULE | Freq: Every day | ORAL | 3 refills | Status: DC

## 2021-08-19 NOTE — Progress Notes (Signed)
   Subjective:    Patient ID: Andrea Byrd, female    DOB: 08-05-1945, 76 y.o.   MRN: [id number]  HPI HTN- chronic problem, on Losartan HCTZ 50/12.'5mg'$  daily and Lasix '20mg'$  daily.  BP is well controlled today.  Reports it is 'fluctuating like crazy' at home.  BPs ranging from 126-155/70-90.  No CP, SOB, HAs, visual changes, edema.  Adjustment d/o- PHQ=10 today despite starting Cymbalta '20mg'$  and Clonazepam 0.'5mg'$  PRN last visit.  Currently doing grief counseling through [facility]osp[facility].  Pt has good support system.  Feeling overwhelmed.  Husband passed on 2022/05/28.  Pt is sleeping 'maybe 3-5 hrs/night'.    Obesity- chronic problem.  Current BMI 33.11  Pt has started walking.  GI issues- pt notes that she is due for EGD due to dysphagia and colonoscopy due to change in stool caliber.  She is very hesitant to schedule w/ her current GI doctor b/c her neighbor spent a week in the ICU w/ a punctured lung and perforated colon.  Asking for new referral.   Review of Systems For ROS see HPI     Objective:   Physical Exam Vitals reviewed.  Constitutional:      General: She is not in acute distress.    Appearance: Normal appearance. She is well-developed.  HENT:     Head: Normocephalic and atraumatic.  Eyes:     Conjunctiva/sclera: Conjunctivae normal.     Pupils: Pupils are equal, round, and reactive to light.  Neck:     Thyroid: No thyromegaly.  Cardiovascular:     Rate and Rhythm: Normal rate and regular rhythm.     Pulses: Normal pulses.     Heart sounds: Normal heart sounds. No murmur heard. Pulmonary:     Effort: Pulmonary effort is normal. No respiratory distress.     Breath sounds: Normal breath sounds.  Abdominal:     General: There is no distension.     Palpations: Abdomen is soft.     Tenderness: There is no abdominal tenderness.  Musculoskeletal:     Cervical back: Normal range of motion and neck supple.     Right lower leg: No edema.     Left lower leg: No edema.  Lymphadenopathy:      Cervical: No cervical adenopathy.  Skin:    General: Skin is warm and dry.  Neurological:     General: No focal deficit present.     Mental Status: She is alert and oriented to person, place, and time.  Psychiatric:        Mood and Affect: Mood normal.        Behavior: Behavior normal.           Assessment & Plan:

## 2021-08-19 NOTE — Assessment & Plan Note (Signed)
Pt's current BMI 33.11  She is frustrated w/ her weight but has started walking regularly.  Applauded her efforts.  Will continue to follow.

## 2021-08-19 NOTE — Assessment & Plan Note (Signed)
Chronic problem.  Well controlled today.  Reports BPs at home are labile.  I suspect this is directly correlated w/ stress.  She is currently asymptomatic so we will not make med changes at this time but we will follow closely.  Will check labs due to ARB and diuretic use.  Pt expressed understanding and is in agreement w/ plan.

## 2021-08-19 NOTE — Assessment & Plan Note (Signed)
Ongoing issue for pt.  Husband passed on 2021/06/10.  She is currently in counseling through Hospice and feels like she has a good support system.  She is interested in changing medication rather than increasing her Cymbalta b/c she heard that Cymbalta was difficult to come off of.  Will switch to Fluoxetine '20mg'$  daily instead and monitor closely.  Pt expressed understanding and is in agreement w/ plan.

## 2021-08-19 NOTE — Assessment & Plan Note (Signed)
Chronic, recurrent problem.  Pt has hx of esophageal dilatation.  Due for repeat EGD.  Would like new GI referral.

## 2021-08-19 NOTE — Patient Instructions (Signed)
Follow up in 4-6 weeks to recheck mood We'll notify you of your lab results and make any changes if needed IF the sleep doesn't improve w/ the change in mood medication, we will address this at the next visit STOP the Cymbalta (Duloxetine) START the Prozac (Fluoxetine) We'll call you to schedule your new GI consult Call with any questions or concerns Hang in there!!!

## 2021-08-20 ENCOUNTER — Other Ambulatory Visit: Payer: Self-pay | Admitting: Family Medicine

## 2021-08-22 ENCOUNTER — Telehealth: Payer: Self-pay

## 2021-08-22 NOTE — Telephone Encounter (Signed)
-----   Message from [name], MD sent at 08/22/2021  7:35 AM EDT ----- Labs look great!  No changes at this time

## 2021-08-22 NOTE — Telephone Encounter (Signed)
Informed pt of lab results  

## 2021-09-04 ENCOUNTER — Other Ambulatory Visit: Payer: Self-pay | Admitting: Family Medicine

## 2021-09-10 ENCOUNTER — Other Ambulatory Visit: Payer: Self-pay | Admitting: Family Medicine

## 2021-09-21 ENCOUNTER — Ambulatory Visit (INDEPENDENT_AMBULATORY_CARE_PROVIDER_SITE_OTHER): Payer: Medicare HMO | Admitting: Family Medicine

## 2021-09-21 ENCOUNTER — Encounter: Payer: Self-pay | Admitting: Family Medicine

## 2021-09-21 VITALS — BP 120/62 | HR 78 | Temp 97.8°F | Resp 16 | Ht 62.0 in | Wt 182.4 lb

## 2021-09-21 DIAGNOSIS — F4323 Adjustment disorder with mixed anxiety and depressed mood: Secondary | ICD-10-CM

## 2021-09-21 DIAGNOSIS — R195 Other fecal abnormalities: Secondary | ICD-10-CM

## 2021-09-21 DIAGNOSIS — R1310 Dysphagia, unspecified: Secondary | ICD-10-CM

## 2021-09-21 DIAGNOSIS — R69 Illness, unspecified: Secondary | ICD-10-CM | POA: Diagnosis not present

## 2021-09-21 NOTE — Patient Instructions (Addendum)
Schedule your complete physical in December (sooner if needed) Continue the Fluoxetine once daily I'm so proud of you!!  You're doing great!! Call with any questions or concerns Hang in there!!

## 2021-09-21 NOTE — Assessment & Plan Note (Signed)
Somewhat improved.  Pt is no longer 'crying all the time'.  Finds she is able to get out more.  Is keeping herself busy.  Not interested in increasing meds at this time.  Will follow.

## 2021-09-21 NOTE — Progress Notes (Signed)
   Subjective:    Patient ID: Andrea Byrd, female    DOB: Feb 25, 1946, 76 y.o.   MRN: [id number]  HPI Adjustment d/o- at last visit pt was switched from Cymbalta to Fluoxetine.  Pt reports things are 'somewhat better'.  No longer crying all the time.  Finds herself very busy.  Is trying to get out more.  Does have a hard time going to church b/c of the memories.  Will watch the services online.   Review of Systems For ROS see HPI     Objective:   Physical Exam Vitals reviewed.  Constitutional:      General: She is not in acute distress.    Appearance: Normal appearance. She is obese.  HENT:     Head: Normocephalic and atraumatic.  Skin:    General: Skin is warm and dry.  Neurological:     General: No focal deficit present.     Mental Status: She is alert and oriented to person, place, and time.  Psychiatric:        Mood and Affect: Mood normal.        Behavior: Behavior normal.        Thought Content: Thought content normal.           Assessment & Plan:

## 2021-09-22 DIAGNOSIS — H401232 Low-tension glaucoma, bilateral, moderate stage: Secondary | ICD-10-CM | POA: Diagnosis not present

## 2021-09-29 ENCOUNTER — Other Ambulatory Visit: Payer: Self-pay | Admitting: Family Medicine

## 2021-09-29 ENCOUNTER — Other Ambulatory Visit: Payer: Self-pay

## 2021-09-29 DIAGNOSIS — E876 Hypokalemia: Secondary | ICD-10-CM

## 2021-09-29 MED ORDER — POTASSIUM CHLORIDE CRYS ER 20 MEQ PO TBCR: 20.0000 meq | EXTENDED_RELEASE_TABLET | Freq: Every day | ORAL | 1 refills | Status: DC

## 2021-10-06 ENCOUNTER — Other Ambulatory Visit: Payer: Self-pay | Admitting: Family Medicine

## 2021-10-27 ENCOUNTER — Encounter (HOSPITAL_BASED_OUTPATIENT_CLINIC_OR_DEPARTMENT_OTHER): Payer: Self-pay | Admitting: Orthopedic Surgery

## 2021-10-27 ENCOUNTER — Telehealth: Payer: Self-pay

## 2021-10-27 ENCOUNTER — Other Ambulatory Visit: Payer: Self-pay

## 2021-10-27 ENCOUNTER — Ambulatory Visit
Admission: RE | Admit: 2021-10-27 | Discharge: 2021-10-27 | Disposition: A | Discharge status: Home / Self Care | Payer: Medicare HMO | Source: Ambulatory Visit | Attending: Family Medicine | Admitting: Family Medicine

## 2021-10-27 DIAGNOSIS — Z780 Asymptomatic menopausal state: Secondary | ICD-10-CM | POA: Diagnosis not present

## 2021-10-27 DIAGNOSIS — M85851 Other specified disorders of bone density and structure, right thigh: Secondary | ICD-10-CM | POA: Diagnosis not present

## 2021-10-27 NOTE — Telephone Encounter (Signed)
-----   Message from [name], MD sent at 10/27/2021  3:53 PM EDT ----- Your bone density shows osteopenia- thinning bone but not as severe as osteoporosis.  Please make sure you are taking daily Calcium and Vit D.

## 2021-10-31 ENCOUNTER — Encounter (HOSPITAL_BASED_OUTPATIENT_CLINIC_OR_DEPARTMENT_OTHER)
Admission: RE | Admit: 2021-10-31 | Discharge: 2021-10-31 | Disposition: A | Discharge status: Home / Self Care | Payer: Medicare HMO | Source: Ambulatory Visit | Attending: Orthopedic Surgery | Admitting: Orthopedic Surgery

## 2021-10-31 ENCOUNTER — Ambulatory Visit: Payer: Medicare HMO | Admitting: Family Medicine

## 2021-10-31 ENCOUNTER — Telehealth: Payer: Self-pay | Admitting: Family Medicine

## 2021-10-31 ENCOUNTER — Encounter: Payer: Self-pay | Admitting: Family Medicine

## 2021-10-31 VITALS — BP 154/81 | HR 75 | Ht 61.5 in | Wt 181.0 lb

## 2021-10-31 DIAGNOSIS — Z01818 Encounter for other preprocedural examination: Secondary | ICD-10-CM | POA: Insufficient documentation

## 2021-10-31 DIAGNOSIS — G2581 Restless legs syndrome: Secondary | ICD-10-CM | POA: Diagnosis not present

## 2021-10-31 DIAGNOSIS — R208 Other disturbances of skin sensation: Secondary | ICD-10-CM

## 2021-10-31 LAB — BASIC METABOLIC PANEL
Anion gap: 6 (ref 5–15)
BUN: 11 mg/dL (ref 8–23)
CO2: 29 mmol/L (ref 22–32)
Calcium: 9.5 mg/dL (ref 8.9–10.3)
Chloride: 105 mmol/L (ref 98–111)
Creatinine, Ser: 0.74 mg/dL (ref 0.44–1.00)
GFR, Estimated: >60 mL/min (ref >60)
Glucose, Bld: 91 mg/dL (ref 70–99)
Potassium: 3.9 mmol/L (ref 3.5–5.1)
Sodium: 140 mmol/L (ref 135–145)

## 2021-10-31 NOTE — Progress Notes (Signed)

## 2021-10-31 NOTE — Telephone Encounter (Signed)
Sent to [facility] pt is aware

## 2021-10-31 NOTE — Telephone Encounter (Signed)
Pt notes insurance does not accept this location can we get him to a different one?

## 2021-10-31 NOTE — Progress Notes (Signed)
PATIENT: Andrea Byrd DOB: 05/07/1945  REASON FOR VISIT: follow up HISTORY FROM: patient  Chief Complaint  Patient presents with   Follow-up    Pt alone rm 10. F/u for RLS. She is taking the mirapex which is helping. States that she never started the compound cream (it was a misunderstanding and she accidentally cancelled) balance is off. She has a upcoming surgery on the right foot planned for Thursday.     HISTORY OF PRESENT ILLNESS:  10/31/21 ALL: Andrea Byrd returns for follow up. She was last seen 04/2021 and neuropathy was worsening. We continued pramipexole '1mg'$  BID and started a compounded neuropathy cream. She has continued pramipexole but reports accidentally cancelling rx for neuropathy cream. She had mistaken the pharmacist for her local pharmacist about another medication. She lost her husband in March. She reports neuropathy continues to worsen. She is having numbness/tingling/cold sensation of both feet. Occasional shooting pains. She does feel gait is off at times. She is scheduled for right bunion correction, 2nd hammer toe, 2nd metatarsal weil and tendon lengthening in two days. She feels this may be contributing to imbalance. She anticipates PT following surgery.   05/02/2021 ALL: Andrea Byrd returns for follow up for RLS and dysesthesias. She was last seen 10/2019. She continues pramipexole '1mg'$  BID. Gabapentin was discontinued by PCP about 2-3 months ago. She did not feel it was helping. Now on duloxetine '20mg'$  daily. 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 She continues to have numbness/tingling and describes a cold sensation of both feet. No changes in gait. She did have a fall a couple of months ago after her sock got stuck on glue that was dropped on the floor. She reports not being able to feel the hot glue through her sock. She continues to provide total care for her husband who is now bed bound. He is now being seen by hospice. She has  an aide that helps for a few hours three times a week.   10/08/2019 ALL:  Andrea Byrd is a 76 y.o. female here today for follow up for RLS and neuropathic pain of both feet. She continues gabapentin '600mg'$  BID and pramipexole '1mg'$  BID. She reports symptoms are about the same. She does have sharp shooting pains of both feel regularly. She does not wish to change treatment plan at this time. She is selling her home and building a smaller home. She continues to provide care to her disabled husband. Her sister passed away last month. She is seen regularly by PCP who currently refills all medications.   HISTORY: (copied from my note on 10/07/2018)  Andrea Byrd is a 76 y.o. female here today for follow up for restless legs. She continues pramipexole '1mg'$  BID and gabapentin '600mg'$  BID. She does continue to have aching in bilateral legs at night. She feels that this is manageable and does not wish to increase/add medications. 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 She is tolerating medications well with no adverse effects noted.    HISTORY: (copied from Dr Gladstone Lighter note on 10/02/2017)   UPDATE (10/02/17, VRP): Since last visit, doing well. Symptoms are mild. Severity is mild. No alleviating or aggravating factors. Tolerating meds. (on side note, pt husband is in hospital with stroke, and planning to be d/c'd today).    UPDATE 08/29/16: Since last visit, RLS stable on gabapentin and pramipexole. Hervey Ard  pains in toes and feet slightly worse.    UPDATE 09/01/15: Since last visit, doing well. On gabapentin '600mg'$  BID + pramipexole '1mg'$  BID. Sxs well controlled. No side effects.    UPDATE 08/25/14: Since last visit, doing well. Has had chiro tx for back issues and feels better. On gabapentin '300mg'$  TID + pramipex '1mg'$  qhs. Satisfied with current dosing.    UPDATE 04/17/14: Since last visit, symptoms have continued. Numbness in feet, restless legs at night. On gabapentin  '300mg'$  TID. PCP also tried her on pramipexole 0.'25mg'$ x3 at bedtime.    UPDATE 08/13/12: 76 year old right-handed female with history of hypertension, restless leg syndrome, here for evaluation of progressive numbness and tingling in her toes and feet. I previously saw this patient in October 2013 as referred by primary care physician. Now patient is referred to me by orthopedic surgery. Since last visit patient's symptoms have progressed. Now she has numbness and tingling in her bilateral feet up to her ankles. She has intermittent sharp sensations as well. Symptoms are fairly symmetric. No symptoms in her upper legs, lower back, torso, arms or neck.   PRIOR HPI (12/18/11): 76 year old right-handed female with history of hypertension, this is a syndrome, here for evaluation of numbness in her feet for the past 3 months. Patient describes numbness and tingling in her toes and bottom of her feet. No significant pain. She also feels coldness in her feet. No back pain or symptoms radiating from her back to her feet. No symptoms in her hands or fingers. No neck pain. Symptoms are mild and gradually progressing. 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 She did not have any numbness in the symptoms at that time.     REVIEW OF SYSTEMS: Out of a complete 14 system review of symptoms, the patient complains only of the following symptoms, restless legs, dysesthesias and all other reviewed systems are negative.   ALLERGIES: Allergies  Allergen Reactions   Nitrofurantoin     Makes mouth break out/blisters   Propoxyphene N-Acetaminophen     REACTION: nausea   Nexium [Esomeprazole Magnesium] Nausea And Vomiting   Nortriptyline Hcl Nausea And Vomiting   Hydrocodone Bit-[place] Mbr Itching and Rash   Morphine Itching and Rash    HOME MEDICATIONS: Outpatient Medications Prior to Visit  Medication Sig Dispense  Refill   FLUoxetine (PROZAC) 20 MG capsule TAKE 1 CAPSULE BY MOUTH EVERY DAY 90 capsule 2   furosemide (LASIX) 20 MG tablet TAKE 1 TABLET BY MOUTH EVERY DAY 90 tablet 1   latanoprost (XALATAN) 0.005 % ophthalmic solution Place 1 drop into both eyes at bedtime.      losartan-hydrochlorothiazide (HYZAAR) 50-12.5 MG tablet TAKE 1 TABLET BY MOUTH EVERY DAY 90 tablet 1   potassium chloride SA (KLOR-CON M) 20 MEQ tablet Take 1 tablet (20 mEq total) by mouth daily. 90 tablet 1   pramipexole (MIRAPEX) 1 MG tablet TAKE 1 TABLET BY MOUTH TWICE A DAY 180 tablet 1   tiZANidine (ZANAFLEX) 4 MG tablet TAKE 1 TABLET BY MOUTH THREE TIMES A DAY AS NEEDED FOR MUSCLE SPASM 45 tablet 1   Vitamin D, Ergocalciferol, (DRISDOL) 1.25 MG (50000 UNIT) CAPS capsule Take 1 capsule (50,000 Units total) by mouth every 7 (seven) days. 12 capsule 0   No facility-administered medications prior to visit.    PAST MEDICAL HISTORY: Past Medical History:  Diagnosis Date   Anemia    Arthritis  Cervical cancer ([place]) 1977   Diverticulosis    Endometriosis    Esophageal stricture    Gastritis    GERD (gastroesophageal reflux disease)    Glaucoma    Herpes simplex type 1 infection    Hiatal hernia    History of colon polyps    History of gallstones    Hypertension    IBS (irritable bowel syndrome)    Insomnia    Post-operative nausea and vomiting    Restless leg syndrome     PAST SURGICAL HISTORY: Past Surgical History:  Procedure Laterality Date   ABDOMINAL HYSTERECTOMY  [place]   COLON RESECTION  2002   COLON SURGERY  2003   removed 2 feet colon   ESOPHAGEAL MANOMETRY N/A 04/24/2016   Procedure: ESOPHAGEAL MANOMETRY (EM);  Surgeon: Mauri Pole, MD;  Location: [facility];  Service: Endoscopy;  Laterality: N/A;   HAND SURGERY     right/ thumb joints replaced both hands   OOPHORECTOMY     TUBAL LIGATION      FAMILY HISTORY: Family  History  Problem Relation Age of Onset   Diabetes Mother    Stroke Mother        brother, sister, MGM   Colon polyps Mother    Heart disease Father    Kidney disease Father    Colon polyps Father    Lung disease Father    Kidney cancer Sister    Stroke Sister    Diabetes Sister    Colon polyps Sister    Celiac disease Other        niece   Heart attack Paternal Grandfather    Colon cancer Neg Hx    Esophageal cancer Neg Hx    Rectal cancer Neg Hx    Stomach cancer Neg Hx     SOCIAL HISTORY: Social History   Socioeconomic History   Marital status: Widowed    Spouse name: Not on file   Number of children: 1   Years of education: Not on file   Highest education level: Not on file  Occupational History   Occupation: Retired  Tobacco Use   Smoking status: Never   Smokeless tobacco: Never  Vaping Use   Vaping Use: Never used  Substance and Sexual Activity   Alcohol use: No   Drug use: No   Sexual activity: Not on file  Other Topics Concern   Not on file  Social History Narrative   Lives with husband floyd   Drinks maybe 4 sodas a week   Social Determinants of Health   Financial Resource Strain: Low Risk  (05/12/2021)   Overall Financial Resource Strain (CARDIA)    Difficulty of Paying Living Expenses: Not hard at all  Food Insecurity: No Food Insecurity (05/12/2021)   Hunger Vital Sign    Worried About Running Out of Food in the Last Year: Never true    [place] in the Last Year: Never true  Transportation Needs: No Transportation Needs (05/12/2021)   PRAPARE - Hydrologist (Medical): No    Lack of Transportation (Non-Medical): No  Physical Activity: Insufficiently Active (05/12/2021)   Exercise Vital Sign    Days of Exercise per Week: 2 days    Minutes of Exercise per Session: 30 min  Stress: No Stress Concern Present (05/12/2021)   [place]  Questionnaire    Feeling of Stress :  Not at all  Social Connections: Moderately Integrated (05/12/2021)   Social Connection and Isolation Panel [NHANES]    Frequency of Communication with Friends and Family: Twice a week    Frequency of Social Gatherings with Friends and Family: Twice a week    Attends Religious Services: 1 to 4 times per year    Active Member of [employer] or Organizations: No    Attends Archivist Meetings: Never    Marital Status: Married  Human resources officer Violence: Not At Risk (05/12/2021)   Humiliation, Afraid, Rape, and Kick questionnaire    Fear of Current or Ex-Partner: No    Emotionally Abused: No    Physically Abused: No    Sexually Abused: No      PHYSICAL EXAM  Vitals:   10/31/21 1031  BP: (!) 154/81  Pulse: 75  Weight: 181 lb (82.1 kg)  Height: 5' 1.5" (1.562 m)     Body mass index is 33.65 kg/m.  Generalized: Well developed, in no acute distress  Cardiology: normal rate and rhythm, no murmur noted Respiratory: clear to auscultation bilaterally  Neurological examination  Mentation: Alert oriented to time, place, history taking. Follows all commands speech and language fluent Cranial nerve II-XII: Pupils were equal round reactive to light. Extraocular movements were full, visual field were full on confrontational test. Facial sensation and strength were normal. Uvula tongue midline. Head turning and shoulder shrug  were normal and symmetric. Motor: The motor testing reveals 5 over 5 strength of all 4 extremities. Good symmetric motor tone is noted throughout.  Sensory: Sensory testing is intact to soft touch on all 4 extremities with exception of decreased sensation to all toes bilaterally. No evidence of extinction is noted.  Coordination: Cerebellar testing reveals good finger-nose-finger and heel-to-shin bilaterally.  Gait and station: Gait is normal.    DIAGNOSTIC DATA (LABS, IMAGING, TESTING) - I reviewed patient records, labs, notes, testing and imaging myself where  available.     09/26/2017    1:26 PM  MMSE - Mini Mental State Exam  Orientation to time 5  Orientation to Place 5  Registration 3  Attention/ Calculation 5  Recall 3  Language- name 2 objects 2  Language- repeat 1  Language- follow 3 step command 3  Language- read & follow direction 1  Write a sentence 1  Copy design 1  Total score 30     Lab Results  Component Value Date   WBC 8.1 08/19/2021   HGB 12.9 08/19/2021   HCT 38.5 08/19/2021   MCV 86.7 08/19/2021   PLT 282.0 08/19/2021      Component Value Date/Time   NA 141 08/19/2021 1457   NA 141 10/11/2015 0000   K 4.1 08/19/2021 1457   CL 102 08/19/2021 1457   CO2 30 08/19/2021 1457   GLUCOSE 87 08/19/2021 1457   BUN 15 08/19/2021 1457   BUN 13 10/11/2015 0000   CREATININE 0.80 08/19/2021 1457   CREATININE 0.88 08/06/2017 1651   CALCIUM 10.1 08/19/2021 1457   PROT 7.1 08/19/2021 1457   ALBUMIN 4.3 08/19/2021 1457   AST 20 08/19/2021 1457   ALT 15 08/19/2021 1457   ALKPHOS 100 08/19/2021 1457   BILITOT 0.4 08/19/2021 1457   GFRNONAA >60 10/05/2017 1535   GFRAA >60 10/05/2017 1535   Lab Results  Component Value Date   CHOL 167 08/19/2021   HDL 47.70 08/19/2021   LDLCALC 93 08/19/2021   TRIG  128.0 08/19/2021   CHOLHDL 3 08/19/2021   Lab Results  Component Value Date   HGBA1C 5.6 08/19/2021   No results found for: "VITAMINB12" Lab Results  Component Value Date   TSH 1.33 08/19/2021      ASSESSMENT AND PLAN 76 y.o. year old female  has a past medical history of Anemia, Arthritis, Cervical cancer ([place]) (1977), Diverticulosis, Endometriosis, Esophageal stricture, Gastritis, GERD (gastroesophageal reflux disease), Glaucoma, Herpes simplex type 1 infection, Hiatal hernia, History of colon polyps, History of gallstones, Hypertension, IBS (irritable bowel syndrome), Insomnia, Post-operative nausea and vomiting, and Restless leg syndrome. here with     ICD-10-CM   1. Restless legs syndrome (RLS)  G25.81      2. Dysesthesia  R20.8         Quinette reports that RLS is stable, however, neuropathy has worsened. Gabpentin was not helpful. Duloxetine is well tolerated but minimally effective. She is very hesitant to increase dose and does not wish to add any medicaitons that could cause sedative side effects. She is most comfortable with topical options. We have discussed effectiveness of capsaicin and lidocaine for temporary relief. Alpha lipoic acid may be beneficial. She reports accidentally cancelling previous rx for compounded cream. I will resend rx for compounded neuropathy cream covered for her to try. She is aware rx is being sent to [facility]. She was encouraged to continue focusing on healthy lifestyle habits. She will follow up with me in 6 months. May follow up as needed if OTC treatments work.  She verbalizes understanding and agreement with this plan.    No orders of the defined types were placed in this encounter.    No orders of the defined types were placed in this encounter.     Debbora Presto, FNP-C 10/31/2021, 12:16 PM [facility] [address], [place], [place] [address] [phone]

## 2021-10-31 NOTE — Telephone Encounter (Signed)
Pt refuses [place] but states she doesn't know anyone she just wants whomever will take the insurance

## 2021-10-31 NOTE — Patient Instructions (Signed)
Below is our plan:  We will continue to monitor symptoms. Please continue close follow up with Dr Birdie Riddle. Capsaicin could be a good option to try at night. You can get capsaicin patches at any pharmacy. You could also try lidocaine cream. Alpha lipoic acid supplements have also shown to be effective. All of these medicaitons are available with the pharmacy. I will try to get topical compounded neuropathy cream. Listen out for a phone call from [facility]  Please make sure you are staying well hydrated. I recommend 50-60 ounces daily. Well balanced diet and regular exercise encouraged. Consistent sleep schedule with 6-8 hours recommended.   Please continue follow up with care team as directed.   Follow up with me in 6 months   You may receive a survey regarding today's visit. I encourage you to leave honest feed back as I do use this information to improve patient care. Thank you for seeing me today!

## 2021-10-31 NOTE — Telephone Encounter (Signed)
Caller name: [place]  On DPR? :yes/no: Yes  Call back number: [phone]  Provider they see: Birdie Riddle  Reason for call: Pt calling b/c was referred to gastroenterologist @! [facility], but they are not in network with her insurance. Is there another recommendation for pt? Please advise.

## 2021-11-03 ENCOUNTER — Ambulatory Visit (HOSPITAL_BASED_OUTPATIENT_CLINIC_OR_DEPARTMENT_OTHER): Payer: Medicare HMO

## 2021-11-03 ENCOUNTER — Ambulatory Visit (HOSPITAL_BASED_OUTPATIENT_CLINIC_OR_DEPARTMENT_OTHER): Payer: Medicare HMO | Admitting: Certified Registered"

## 2021-11-03 ENCOUNTER — Other Ambulatory Visit: Payer: Self-pay

## 2021-11-03 ENCOUNTER — Telehealth: Payer: Self-pay

## 2021-11-03 ENCOUNTER — Encounter (HOSPITAL_BASED_OUTPATIENT_CLINIC_OR_DEPARTMENT_OTHER)
Admission: RE | Disposition: A | Discharge status: Home / Self Care | Payer: Self-pay | Source: Home / Self Care | Attending: Orthopedic Surgery

## 2021-11-03 ENCOUNTER — Encounter (HOSPITAL_BASED_OUTPATIENT_CLINIC_OR_DEPARTMENT_OTHER): Payer: Self-pay | Admitting: Orthopedic Surgery

## 2021-11-03 ENCOUNTER — Ambulatory Visit (HOSPITAL_BASED_OUTPATIENT_CLINIC_OR_DEPARTMENT_OTHER)
Admission: RE | Admit: 2021-11-03 | Discharge: 2021-11-03 | Disposition: A | Discharge status: Home / Self Care | Payer: Medicare HMO | Attending: Orthopedic Surgery | Admitting: Orthopedic Surgery

## 2021-11-03 ENCOUNTER — Telehealth: Payer: Self-pay | Admitting: Gastroenterology

## 2021-11-03 DIAGNOSIS — M21611 Bunion of right foot: Secondary | ICD-10-CM | POA: Diagnosis not present

## 2021-11-03 DIAGNOSIS — Z79899 Other long term (current) drug therapy: Secondary | ICD-10-CM | POA: Diagnosis not present

## 2021-11-03 DIAGNOSIS — I10 Essential (primary) hypertension: Secondary | ICD-10-CM | POA: Diagnosis not present

## 2021-11-03 DIAGNOSIS — Z01818 Encounter for other preprocedural examination: Secondary | ICD-10-CM

## 2021-11-03 DIAGNOSIS — M2041 Other hammer toe(s) (acquired), right foot: Secondary | ICD-10-CM | POA: Diagnosis not present

## 2021-11-03 DIAGNOSIS — M7741 Metatarsalgia, right foot: Secondary | ICD-10-CM | POA: Diagnosis not present

## 2021-11-03 DIAGNOSIS — G8918 Other acute postprocedural pain: Secondary | ICD-10-CM | POA: Diagnosis not present

## 2021-11-03 DIAGNOSIS — M21621 Bunionette of right foot: Secondary | ICD-10-CM | POA: Insufficient documentation

## 2021-11-03 HISTORY — PX: BUNIONECTOMY WITH HAMMERTOE RECONSTRUCTION: SHX5600

## 2021-11-03 SURGERY — BUNIONECTOMY, WITH HAMMER TOE CORRECTION
Anesthesia: Regional | Site: Foot | Laterality: Right

## 2021-11-03 MED ORDER — VANCOMYCIN HCL 1000 MG IV SOLR
INTRAVENOUS | Status: AC
Start: 2021-11-03 — End: ?
  Filled 2021-11-03: qty 20

## 2021-11-03 MED ORDER — PHENYLEPHRINE 80 MCG/ML (10ML) SYRINGE FOR IV PUSH (FOR BLOOD PRESSURE SUPPORT)
PREFILLED_SYRINGE | INTRAVENOUS | Status: AC
  Filled 2021-11-03: qty 60

## 2021-11-03 MED ORDER — OXYCODONE HCL 5 MG PO TABS
5.0000 mg | ORAL_TABLET | Freq: Once | ORAL | Status: AC
  Administered 2021-11-03: 5 mg via ORAL

## 2021-11-03 MED ORDER — ACETAMINOPHEN 10 MG/ML IV SOLN
INTRAVENOUS | Status: AC
  Filled 2021-11-03: qty 100

## 2021-11-03 MED ORDER — BUPIVACAINE-EPINEPHRINE (PF) 0.5% -1:200000 IJ SOLN
INTRAMUSCULAR | Status: AC
  Filled 2021-11-03: qty 30

## 2021-11-03 MED ORDER — ACETAMINOPHEN 10 MG/ML IV SOLN
1000.0000 mg | Freq: Once | INTRAVENOUS | Status: AC
  Administered 2021-11-03: 1000 mg via INTRAVENOUS

## 2021-11-03 MED ORDER — SODIUM CHLORIDE 0.9 % IV SOLN: INTRAVENOUS | Status: DC

## 2021-11-03 MED ORDER — VANCOMYCIN HCL 500 MG IV SOLR
INTRAVENOUS | Status: DC | PRN
  Administered 2021-11-03: 500 mg via TOPICAL

## 2021-11-03 MED ORDER — FENTANYL CITRATE (PF) 100 MCG/2ML IJ SOLN
100.0000 ug | Freq: Once | INTRAMUSCULAR | Status: AC
  Administered 2021-11-03: 50 ug via INTRAVENOUS

## 2021-11-03 MED ORDER — VANCOMYCIN HCL 500 MG IV SOLR
INTRAVENOUS | Status: AC
  Filled 2021-11-03: qty 10

## 2021-11-03 MED ORDER — LIDOCAINE 2% (20 MG/ML) 5 ML SYRINGE
INTRAMUSCULAR | Status: AC
  Filled 2021-11-03: qty 20

## 2021-11-03 MED ORDER — FENTANYL CITRATE (PF) 100 MCG/2ML IJ SOLN
INTRAMUSCULAR | Status: AC
  Filled 2021-11-03: qty 2

## 2021-11-03 MED ORDER — CEFAZOLIN SODIUM-DEXTROSE 2-4 GM/100ML-% IV SOLN: 2.0000 g | INTRAVENOUS | Status: DC

## 2021-11-03 MED ORDER — FENTANYL CITRATE (PF) 100 MCG/2ML IJ SOLN
INTRAMUSCULAR | Status: DC | PRN
Start: 2021-11-03 — End: 2021-11-03
  Administered 2021-11-03 (×4): 25 ug via INTRAVENOUS

## 2021-11-03 MED ORDER — LACTATED RINGERS IV SOLN: INTRAVENOUS | Status: DC

## 2021-11-03 MED ORDER — EPHEDRINE 5 MG/ML INJ
INTRAVENOUS | Status: AC
  Filled 2021-11-03: qty 5

## 2021-11-03 MED ORDER — PROPOFOL 500 MG/50ML IV EMUL
INTRAVENOUS | Status: AC
  Filled 2021-11-03: qty 150

## 2021-11-03 MED ORDER — EPHEDRINE 5 MG/ML INJ
INTRAVENOUS | Status: AC
  Filled 2021-11-03: qty 15

## 2021-11-03 MED ORDER — MIDAZOLAM HCL 2 MG/2ML IJ SOLN
1.0000 mg | Freq: Once | INTRAMUSCULAR | Status: AC
  Administered 2021-11-03: 1 mg via INTRAVENOUS

## 2021-11-03 MED ORDER — PROPOFOL 10 MG/ML IV BOLUS
INTRAVENOUS | Status: DC | PRN
  Administered 2021-11-03: 130 mg via INTRAVENOUS

## 2021-11-03 MED ORDER — OXYCODONE HCL 5 MG PO TABS: 5.0000 mg | ORAL_TABLET | Freq: Four times a day (QID) | ORAL | 0 refills | Status: AC | PRN

## 2021-11-03 MED ORDER — 0.9 % SODIUM CHLORIDE (POUR BTL) OPTIME
TOPICAL | Status: DC | PRN
  Administered 2021-11-03: 200 mL

## 2021-11-03 MED ORDER — PHENYLEPHRINE 80 MCG/ML (10ML) SYRINGE FOR IV PUSH (FOR BLOOD PRESSURE SUPPORT)
PREFILLED_SYRINGE | INTRAVENOUS | Status: AC
  Filled 2021-11-03: qty 20

## 2021-11-03 MED ORDER — AMISULPRIDE (ANTIEMETIC) 5 MG/2ML IV SOLN: 10.0000 mg | Freq: Once | INTRAVENOUS | Status: DC | PRN

## 2021-11-03 MED ORDER — HYDROMORPHONE HCL 1 MG/ML IJ SOLN
INTRAMUSCULAR | Status: AC
  Filled 2021-11-03: qty 0.5

## 2021-11-03 MED ORDER — HYDROMORPHONE HCL 1 MG/ML IJ SOLN
0.2500 mg | INTRAMUSCULAR | Status: AC | PRN
  Administered 2021-11-03 (×4): 0.25 mg via INTRAVENOUS

## 2021-11-03 MED ORDER — MIDAZOLAM HCL 2 MG/2ML IJ SOLN
INTRAMUSCULAR | Status: AC
  Filled 2021-11-03: qty 2

## 2021-11-03 MED ORDER — ONDANSETRON HCL 4 MG/2ML IJ SOLN
INTRAMUSCULAR | Status: DC | PRN
  Administered 2021-11-03: 4 mg via INTRAVENOUS

## 2021-11-03 MED ORDER — ROPIVACAINE HCL 5 MG/ML IJ SOLN
INTRAMUSCULAR | Status: DC | PRN
  Administered 2021-11-03: 30 mL via PERINEURAL

## 2021-11-03 MED ORDER — SODIUM CHLORIDE 0.9 % IV SOLN
INTRAVENOUS | Status: AC | PRN
  Administered 2021-11-03: 100 mL

## 2021-11-03 MED ORDER — LIDOCAINE 2% (20 MG/ML) 5 ML SYRINGE
INTRAMUSCULAR | Status: DC | PRN
  Administered 2021-11-03: 30 mg via INTRAVENOUS

## 2021-11-03 MED ORDER — EPHEDRINE 5 MG/ML INJ
INTRAVENOUS | Status: AC
  Filled 2021-11-03: qty 30

## 2021-11-03 MED ORDER — CEFAZOLIN SODIUM-DEXTROSE 2-4 GM/100ML-% IV SOLN
INTRAVENOUS | Status: AC
  Filled 2021-11-03: qty 100

## 2021-11-03 MED ORDER — DEXAMETHASONE SODIUM PHOSPHATE 10 MG/ML IJ SOLN
INTRAMUSCULAR | Status: AC
  Filled 2021-11-03: qty 2

## 2021-11-03 MED ORDER — EPHEDRINE SULFATE (PRESSORS) 50 MG/ML IJ SOLN
INTRAMUSCULAR | Status: DC | PRN
  Administered 2021-11-03 (×2): 10 mg via INTRAVENOUS

## 2021-11-03 MED ORDER — PROPOFOL 500 MG/50ML IV EMUL
INTRAVENOUS | Status: DC | PRN
  Administered 2021-11-03: 150 ug/kg/min via INTRAVENOUS

## 2021-11-03 MED ORDER — ONDANSETRON HCL 4 MG/2ML IJ SOLN
INTRAMUSCULAR | Status: AC
  Filled 2021-11-03: qty 16

## 2021-11-03 MED ORDER — OXYCODONE HCL 5 MG PO TABS
ORAL_TABLET | ORAL | Status: AC
  Filled 2021-11-03: qty 1

## 2021-11-03 MED ORDER — PHENYLEPHRINE HCL (PRESSORS) 10 MG/ML IV SOLN
INTRAVENOUS | Status: DC | PRN
  Administered 2021-11-03 (×3): 40 ug via INTRAVENOUS

## 2021-11-03 MED ORDER — FENTANYL CITRATE (PF) 100 MCG/2ML IJ SOLN
25.0000 ug | INTRAMUSCULAR | Status: DC | PRN
  Administered 2021-11-03: 25 ug via INTRAVENOUS
  Administered 2021-11-03: 50 ug via INTRAVENOUS
  Administered 2021-11-03: 25 ug via INTRAVENOUS

## 2021-11-03 MED ORDER — DEXAMETHASONE SODIUM PHOSPHATE 10 MG/ML IJ SOLN
INTRAMUSCULAR | Status: DC | PRN
  Administered 2021-11-03: 5 mg
  Administered 2021-11-03: 4 mg

## 2021-11-03 MED ORDER — CEFAZOLIN SODIUM-DEXTROSE 2-4 GM/100ML-% IV SOLN
2.0000 g | INTRAVENOUS | Status: AC
  Administered 2021-11-03: 2 g via INTRAVENOUS

## 2021-11-03 SURGICAL SUPPLY — 101 items
APL PRP STRL LF DISP 70% ISPRP (MISCELLANEOUS) ×1
BANDAGE ESMARK 6X9 LF (GAUZE/BANDAGES/DRESSINGS) IMPLANT
BIT DRILL CANN 2.4 (BIT) ×1
BIT DRILL CANN MAX VPC 2.4 (BIT) IMPLANT
BIT DRILL CANNULAT MICA 2.2X60 (BIT) IMPLANT
BIT DRILL MICA CANN SS 3X60 (BIT) IMPLANT
BLADE AVERAGE 25X9 (BLADE) IMPLANT
BLADE LONG MED 25X9 (BLADE) IMPLANT
BLADE MICRO SAGITTAL (BLADE) IMPLANT
BLADE MINI RND TIP GREEN BEAV (BLADE) ×1 IMPLANT
BLADE OSC/SAG .038X5.5 CUT EDG (BLADE) IMPLANT
BLADE SURG 15 STRL LF DISP TIS (BLADE) ×2 IMPLANT
BLADE SURG 15 STRL SS (BLADE) ×2
BNDG CMPR 9X4 STRL LF SNTH (GAUZE/BANDAGES/DRESSINGS) ×1
BNDG CMPR 9X6 STRL LF SNTH (GAUZE/BANDAGES/DRESSINGS)
BNDG ELASTIC 4X5.8 VLCR STR LF (GAUZE/BANDAGES/DRESSINGS) ×1 IMPLANT
BNDG ESMARK 4X9 LF (GAUZE/BANDAGES/DRESSINGS) IMPLANT
BNDG ESMARK 6X9 LF (GAUZE/BANDAGES/DRESSINGS)
BNDG GZE 12X3 1 PLY HI ABS (GAUZE/BANDAGES/DRESSINGS) ×1
BNDG STRETCH GAUZE 3IN X12FT (GAUZE/BANDAGES/DRESSINGS) ×1 IMPLANT
BUR MICA 2X12 (BURR) IMPLANT
BUR MICA 2X20 (BURR) IMPLANT
BUR MIS CONICAL WEDGE 4.3X13 (BUR) ×1 IMPLANT
BURR CONICAL 4.3 (BUR) ×1
BURR MICA 2X12 (BURR) ×1
BURR MICA 2X20 (BURR) ×1
BURR MIS CONICAL WEDGE 4.3X13 (BUR) ×1
CHLORAPREP W/TINT 26 (MISCELLANEOUS) ×1 IMPLANT
COVER BACK TABLE 60X90IN (DRAPES) ×1 IMPLANT
CUFF TOURN SGL QUICK 24 (TOURNIQUET CUFF)
CUFF TOURN SGL QUICK 34 (TOURNIQUET CUFF)
CUFF TRNQT CYL 24X4X16.5-23 (TOURNIQUET CUFF) IMPLANT
CUFF TRNQT CYL 34X4.125X (TOURNIQUET CUFF) IMPLANT
DRAPE EXTREMITY T 121X128X90 (DISPOSABLE) ×1 IMPLANT
DRAPE OEC MINIVIEW 54X84 (DRAPES) ×1 IMPLANT
DRAPE U-SHAPE 47X51 STRL (DRAPES) ×1 IMPLANT
DRIVER MICA HEX 2.0 (MISCELLANEOUS) IMPLANT
DRIVER MICA HEX 2.5 (MISCELLANEOUS) IMPLANT
DRSG MEPITEL 4X7.2 (GAUZE/BANDAGES/DRESSINGS) ×1 IMPLANT
ELECT REM PT RETURN 9FT ADLT (ELECTROSURGICAL) ×1
ELECTRODE REM PT RTRN 9FT ADLT (ELECTROSURGICAL) ×1 IMPLANT
GAUGE DEPTH MICA PROSTEP 150 (MISCELLANEOUS) IMPLANT
GAUZE SPONGE 4X4 12PLY STRL (GAUZE/BANDAGES/DRESSINGS) ×1 IMPLANT
GLOVE BIO SURGEON STRL SZ8 (GLOVE) ×1 IMPLANT
GLOVE BIOGEL PI IND STRL 7.0 (GLOVE) IMPLANT
GLOVE BIOGEL PI IND STRL 8 (GLOVE) ×2 IMPLANT
GLOVE BIOGEL PI INDICATOR 7.0 (GLOVE) ×3
GLOVE BIOGEL PI INDICATOR 8 (GLOVE) ×2
GLOVE ECLIPSE 8.0 STRL XLNG CF (GLOVE) ×1 IMPLANT
GLOVE SURG SS PI 7.0 STRL IVOR (GLOVE) IMPLANT
GOWN STRL REUS W/ TWL LRG LVL3 (GOWN DISPOSABLE) ×1 IMPLANT
GOWN STRL REUS W/ TWL XL LVL3 (GOWN DISPOSABLE) ×2 IMPLANT
GOWN STRL REUS W/TWL LRG LVL3 (GOWN DISPOSABLE) ×2
GOWN STRL REUS W/TWL XL LVL3 (GOWN DISPOSABLE) ×2
HANDLE AO CANNULATED DRIVER (MISCELLANEOUS) IMPLANT
IV CATH PLACEMENT 20 GA (IV SOLUTION) IMPLANT
K-WIRE BLUNT TROCAR 1.4X150 (WIRE) ×1
K-WIRE BLUNT/TROCAR 0.9X150 (WIRE) ×1
K-WIRE COCR 1.1X105 (WIRE) ×1
K-WIRE DBL .054X4 NSTRL (WIRE)
KWIRE BLUNT TROCAR 1.4X150 (WIRE) IMPLANT
KWIRE BLUNT/TROCAR 0.9X150 (WIRE) IMPLANT
KWIRE COCR 1.1X105 (WIRE) IMPLANT
KWIRE DBL .054X4 NSTRL (WIRE) IMPLANT
NDL HYPO 25X1 1.5 SAFETY (NEEDLE) IMPLANT
NEEDLE HYPO 22GX1.5 SAFETY (NEEDLE) IMPLANT
NEEDLE HYPO 25X1 1.5 SAFETY (NEEDLE) IMPLANT
NS IRRIG 1000ML POUR BTL (IV SOLUTION) ×1 IMPLANT
PACK BASIN DAY SURGERY FS (CUSTOM PROCEDURE TRAY) ×1 IMPLANT
PACK INSTRUMENT PROSTEP DISP (INSTRUMENTS) IMPLANT
PAD CAST 4YDX4 CTTN HI CHSV (CAST SUPPLIES) ×1 IMPLANT
PADDING CAST ABS COTTON 4X4 ST (CAST SUPPLIES) IMPLANT
PADDING CAST COTTON 4X4 STRL (CAST SUPPLIES) ×1
PENCIL SMOKE EVACUATOR (MISCELLANEOUS) ×1 IMPLANT
SANITIZER HAND PURELL 535ML FO (MISCELLANEOUS) ×1 IMPLANT
SCREW HCS TWIST-OFF 2.0X12MM (Screw) IMPLANT
SCREW MICA PROSTEP 3.0X24 (Screw) IMPLANT
SCREW MICA PROSTEP 3X36 (Screw) IMPLANT
SCREW PROSTEP 4X54 (Screw) IMPLANT
SCREW VPC 3.4X16 (Screw) IMPLANT
SHEET MEDIUM DRAPE 40X70 STRL (DRAPES) ×1 IMPLANT
SLEEVE SCD COMPRESS KNEE MED (STOCKING) ×1 IMPLANT
SPLINT PLASTER CAST FAST 5X30 (CAST SUPPLIES) IMPLANT
SPONGE T-LAP 18X18 ~~LOC~~+RFID (SPONGE) ×1 IMPLANT
STOCKINETTE 6  STRL (DRAPES) ×1
STOCKINETTE 6 STRL (DRAPES) ×1 IMPLANT
SUCTION FRAZIER HANDLE 10FR (MISCELLANEOUS) ×1
SUCTION TUBE FRAZIER 10FR DISP (MISCELLANEOUS) ×1 IMPLANT
SUT ETHILON 3 0 PS 1 (SUTURE) ×1 IMPLANT
SUT MNCRL AB 3-0 PS2 18 (SUTURE) ×1 IMPLANT
SUT VIC AB 2-0 SH 27 (SUTURE)
SUT VIC AB 2-0 SH 27XBRD (SUTURE) IMPLANT
SUT VICRYL 0 SH 27 (SUTURE) IMPLANT
SUT VICRYL 0 UR6 27IN ABS (SUTURE) IMPLANT
SYR BULB EAR ULCER 3OZ GRN STR (SYRINGE) ×1 IMPLANT
SYR CONTROL 10ML LL (SYRINGE) IMPLANT
TOWEL GREEN STERILE FF (TOWEL DISPOSABLE) ×2 IMPLANT
TRANSLATOR PRSTEP MICA 1ST MET (MISCELLANEOUS) IMPLANT
TUBE CONNECTING 20X1/4 (TUBING) ×1 IMPLANT
TUBE SURG IRRIGATION (TUBING) IMPLANT
UNDERPAD 30X36 HEAVY ABSORB (UNDERPADS AND DIAPERS) ×1 IMPLANT

## 2021-11-03 NOTE — Anesthesia Preprocedure Evaluation (Signed)
Anesthesia Evaluation  Patient identified by MRN, date of birth, ID band Patient awake    Reviewed: Allergy & Precautions, NPO status , Patient's Chart, lab work & pertinent test results  History of Anesthesia Complications (+) PONV and history of anesthetic complications  Airway Mallampati: II  TM Distance: >3 FB Neck ROM: Full    Dental no notable dental hx.    Pulmonary neg pulmonary ROS,    Pulmonary exam normal        Cardiovascular hypertension, Pt. on medications  Rhythm:Regular Rate:Normal     Neuro/Psych negative neurological ROS  negative psych ROS   GI/Hepatic Neg liver ROS, hiatal hernia, GERD  ,  Endo/Other  negative endocrine ROS  Renal/GU negative Renal ROS  negative genitourinary   Musculoskeletal  (+) Arthritis , Osteoarthritis,    Abdominal Normal abdominal exam  (+)   Peds  Hematology  (+) Blood dyscrasia, anemia ,   Anesthesia Other Findings   Reproductive/Obstetrics                             Anesthesia Physical Anesthesia Plan  ASA: 2  Anesthesia Plan: General and Regional   Post-op Pain Management: Regional block*   Induction: Intravenous  PONV Risk Score and Plan: 4 or greater and Ondansetron, Dexamethasone, Midazolam and Treatment may vary due to age or medical condition  Airway Management Planned: Mask and LMA  Additional Equipment: None  Intra-op Plan:   Post-operative Plan: Extubation in OR  Informed Consent: I have reviewed the patients History and Physical, chart, labs and discussed the procedure including the risks, benefits and alternatives for the proposed anesthesia with the patient or authorized representative who has indicated his/her understanding and acceptance.     Dental advisory given  Plan Discussed with: CRNA  Anesthesia Plan Comments: (Lab Results      Component                Value               Date                       WBC                      8.1                 08/19/2021                HGB                      12.9                08/19/2021                HCT                      38.5                08/19/2021                MCV                      86.7                08/19/2021                PLT  282.0               08/19/2021          )        Anesthesia Quick Evaluation

## 2021-11-03 NOTE — Transfer of Care (Signed)
Immediate Anesthesia Transfer of Care Note  Patient: CHESSA BARRASSO  Procedure(s) Performed: Right bunion correction with double osteotomy; Second Metatarsal weil; Second hammertoe correction; third through fifth tendon lengthening; excision of bunionette (Right: Foot)  Patient Location: PACU  Anesthesia Type:GA combined with regional for post-op pain  Level of Consciousness: awake, alert , oriented and patient cooperative  Airway & Oxygen Therapy: Patient Spontanous Breathing and Patient connected to face mask oxygen  Post-op Assessment: Report given to RN and Post -op Vital signs reviewed and stable  Post vital signs: Reviewed and stable  Last Vitals:  Vitals Value Taken Time  BP    Temp    Pulse 89 11/03/21 0929  Resp 17 11/03/21 0929  SpO2 92 % 11/03/21 0929  Vitals shown include unvalidated device data.  Last Pain:  Vitals:   11/03/21 0626  TempSrc: Oral  PainSc: 0-No pain      Patients Stated Pain Goal: 6 (37/10/62 6948)  Complications: No notable events documented.

## 2021-11-03 NOTE — Anesthesia Procedure Notes (Signed)
Anesthesia Regional Block: Popliteal block   Pre-Anesthetic Checklist: , timeout performed,  Correct Patient, Correct Site, Correct Laterality,  Correct Procedure, Correct Position, site marked,  Risks and benefits discussed,  Surgical consent,  Pre-op evaluation,  At surgeon's request and post-op pain management  Laterality: Right  Prep: Dura Prep       Needles:  Injection technique: Single-shot  Needle Type: Echogenic Stimulator Needle     Needle Length: 10cm  Needle Gauge: 20     Additional Needles:   Procedures:,,,, ultrasound used (permanent image in chart),,    Narrative:  Start time: 11/03/2021 7:00 AM End time: 11/03/2021 7:04 AM Injection made incrementally with aspirations every 5 mL.  Performed by: Personally  Anesthesiologist: Darral Dash, DO  Additional Notes: Patient identified. Risks/Benefits/Options discussed with patient including but not limited to bleeding, infection, nerve damage, failed block, incomplete pain control. Patient expressed understanding and wished to proceed. All questions were answered. Sterile technique was used throughout the entire procedure. Please see nursing notes for vital signs. Aspirated in 5cc intervals with injection for negative confirmation. Patient was given instructions on fall risk and not to get out of bed. All questions and concerns addressed with instructions to call with any issues or inadequate analgesia.

## 2021-11-03 NOTE — Progress Notes (Signed)
Assisted Dr. Jana Half with right, popliteal, ultrasound guided block. Side rails up, monitors on throughout procedure. See vital signs in flow sheet. Tolerated Procedure well.

## 2021-11-03 NOTE — Anesthesia Postprocedure Evaluation (Signed)
Anesthesia Post Note  Patient: Andrea Byrd  Procedure(s) Performed: Right bunion correction with double osteotomy; Second Metatarsal weil; Second hammertoe correction; third through fifth tendon lengthening; excision of bunionette (Right: Foot)     Patient location during evaluation: PACU Anesthesia Type: Regional and General Level of consciousness: awake and alert Pain management: pain level controlled Vital Signs Assessment: post-procedure vital signs reviewed and stable Respiratory status: spontaneous breathing, nonlabored ventilation, respiratory function stable and patient connected to nasal cannula oxygen Cardiovascular status: blood pressure returned to baseline and stable Postop Assessment: no apparent nausea or vomiting Anesthetic complications: no   No notable events documented.  Last Vitals:  Vitals:   11/03/21 1115 11/03/21 1204  BP: 109/60 (!) 99/55  Pulse: 74 78  Resp: 11 16  Temp:  36.6 C  SpO2: 95% 96%    Last Pain:  Vitals:   11/03/21 1204  TempSrc:   PainSc: 5                  Sheela Mcculley P Bentlee [place]

## 2021-11-03 NOTE — Op Note (Signed)
11/03/2021  9:33 AM  PATIENT:  Andrea Byrd  76 y.o. female  PRE-OPERATIVE DIAGNOSIS:  Right foot bunion, second through fourth hammertoes and bunionette  POST-OPERATIVE DIAGNOSIS:  Right foot bunion, second through fourth hammertoes and bunionette  Procedure(s): 1.  Right foot bunion correction with double osteotomy (MIS chevron and Akin) 2.  Percutaneous flexor tenotomy of the second, third and fourth toes 3.  Excision of fifth metatarsal tailor's bunion 4.  Right second metatarsal Weil osteotomy 5.  Right second hammertoe correction 6.  AP, lateral and oblique radiographs of the right foot  SURGEON:  Wylene Simmer, MD  ASSISTANT: None  ANESTHESIA:   General, regional  EBL:  minimal   TOURNIQUET:   Total Tourniquet Time Documented: Calf (Right) - 14 minutes Total: Calf (Right) - 14 minutes  COMPLICATIONS:  None apparent  DISPOSITION:  Extubated, awake and stable to recovery.  INDICATION FOR PROCEDURE: 76 year old female without significant past medical history has a long history of right forefoot pain due to a bunion deformity, a bunionette deformity, metatarsalgia and second through fourth hammertoe deformities.  She has failed nonoperative treatment and presents today for surgical correction of these painful deformities.  The risks and benefits of the alternative treatment options have been discussed in detail.  The patient wishes to proceed with surgery and specifically understands risks of bleeding, infection, nerve damage, blood clots, need for additional surgery, amputation and death.   PROCEDURE IN DETAIL: After preoperative consent was obtained the correct operative site was identified, the patient was brought the operating room and placed supine on the operating table.  General anesthesia was induced.  Preoperative antibiotics were administered.  Surgical timeout was taken.  The right lower extremity was prepped and draped in standard sterile fashion with a tourniquet  around the thigh.  A small incision was then made dorsal medial to the neck of the metatarsal.  Subperiosteal dissection was carried dorsal to the metatarsal and medial to the metatarsal.  A 2 x 20 mm Shannon bur was used to create a chevron osteotomy in the metatarsal neck.  The Stryker targeting guide was inserted into the metatarsal shaft.  It was appropriately positioned and a guidepin inserted into the metatarsal head after correcting the rotational deformity.  The guide was then used to translate the metatarsal shaft medially and pushed the metatarsal head laterally correcting the intermetatarsal and hallux valgus angles appropriately.  The targeting guide was used to insert 2 guidepins from the proximal medial metatarsal into the metatarsal head.  Radiographs confirmed appropriate position of both pins.  The more lateral pin was overdrilled and a 4 mm Stryker MIS screw inserted.  The more medial pin was then overdrilled and a 3 mm screw inserted.  AP, lateral and oblique radiographs confirmed appropriate position of both screws and appropriate correction of the bunion deformity.  A small incision was then made at the medial aspect of the proximal phalanx.  Subperiosteal dissection was carried plantarly and dorsally.  The 2 x 12 mm Shannon bur was used to create a closing wedge osteotomy medially.  The osteotomy was closed down.  A guidepin was inserted across the osteotomy site.  Radiographs confirmed appropriate correction of hallux valgus interphalangeus and appropriate position of the guidepin.  The pin was overdrilled.  A cannulated 3 mm screw was inserted to appropriately compress the osteotomy.  Radiographs confirmed appropriate position of all of the hardware.  Shannon bur was then inserted subperiosteally along the medial first metatarsal.  It was used  to remove the overhanging bony prominence.  The wounds were irrigated with an Angiocath and all bone paste removed.  The wounds were all closed with  nylon.  Attention was turned to the fifth metatarsal.  The prominent metatarsal head was identified.  A small incision was made at the MTP joint.  The 2 x 12 mm Shannon bur was inserted.  It was used to resect the hypertrophic lateral eminence in line with the metatarsal shaft.  Radiographs confirmed appropriate resection of the bunionette deformity.  The wound was irrigated with the Angiocath and all bone paste removed.  The incision was closed with nylon.  A Beaver blade was then used to percutaneously release the flexor digitorum longus tendon to the second, third and fourth toes through the distal flexion crease.  Attention was then turned to the dorsal second MTP joint.  A longitudinal incision was made and dissection carried down through the subcutaneous tissues.  The extensor tendons were lengthened and the dorsal joint capsule excised.  The metatarsal head was exposed.  A Weil osteotomy was made with the [facility] bur.  The metatarsal head was allowed to retract proximally and the osteotomy was fixed with a 2 mm Zimmer [employer]omet FRS screw.  Overhanging bone was trimmed with a rondure.  Attention was turned to the second toe where a transverse incision was made over the PIP joint.  Dissection was carried down through the subcutaneous tissues.  The head of the proximal phalanx was resected along with the base of the middle phalanx.  The joint was reduced.  A 3.4 mm Zimmer [employer]omet VPC screw was inserted to compress the joint.  AP, lateral and oblique radiographs of the right foot were obtained.  These show interval correction of the bunion and hammertoe deformities as well as resection of the bunionette.  Hardware is appropriately positioned and of the appropriate lengths.  The remaining wounds were irrigated and closed with nylon.  Sterile dressings were applied followed by a well-padded bunion wrap.  The ankle Esmarch tourniquet was removed after approximately 15 minutes when the second metatarsal Weil and  hammertoe corrections were completed.  Hemostasis was achieved prior to closure.  The patient was awakened from anesthesia and transported to the recovery room in stable condition.  FOLLOW UP PLAN: Weightbearing as tolerated in a flat postop shoe.  No indication for DVT prophylaxis in this ambulatory patient.  Follow-up with me in the office in 2 weeks for suture removal and conversion to a toe spacer and also taping the second toe down.   RADIOGRAPHS: AP, lateral and oblique radiographs of the right foot are obtained intraoperatively.  These show interval correction of the bunion and hammertoe deformities.  Excision of the fifth metatarsal bunionette is also noted.  Hardware is appropriately positioned and of the appropriate lengths.  No acute injuries.

## 2021-11-03 NOTE — H&P (Signed)
Andrea Byrd is an 76 y.o. female.   Chief Complaint: Right foot pain HPI: 76 year old female without significant past medical history has a long history of right forefoot pain due to a prominent bunion deformity as well as a tailor's bunion and second hammertoe.  She has flexible third and fourth hammertoes as well.  She has failed nonoperative treatment including activity modification, oral anti-inflammatories and shoewear modification.  She presents today for surgical correction of these painful right forefoot deformities.  Past Medical History:  Diagnosis Date   Anemia    Arthritis    Cervical cancer ([place]) 1977   Diverticulosis    Endometriosis    Esophageal stricture    Gastritis    GERD (gastroesophageal reflux disease)    Glaucoma    Herpes simplex type 1 infection    Hiatal hernia    History of colon polyps    History of gallstones    Hypertension    IBS (irritable bowel syndrome)    Insomnia    Post-operative nausea and vomiting    Restless leg syndrome     Past Surgical History:  Procedure Laterality Date   [place]   COLON RESECTION  2002   COLON SURGERY  2003   removed 2 feet colon   ESOPHAGEAL MANOMETRY N/A 04/24/2016   Procedure: ESOPHAGEAL MANOMETRY (EM);  Surgeon: Mauri Pole, MD;  Location: [facility];  Service: Endoscopy;  Laterality: N/A;   HAND SURGERY     right/ thumb joints replaced both hands   OOPHORECTOMY     TUBAL LIGATION      Family History  Problem Relation Age of Onset   Diabetes Mother    Stroke Mother        brother, sister, MGM   Colon polyps Mother    Heart disease Father    Kidney disease Father    Colon polyps Father    Lung disease Father    Kidney cancer Sister    Stroke Sister    Diabetes Sister    Colon polyps Sister    Celiac disease Other        niece   Heart attack Paternal Grandfather    Colon cancer Neg Hx     Esophageal cancer Neg Hx    Rectal cancer Neg Hx    Stomach cancer Neg Hx    Social History:  reports that she has never smoked. She has never used smokeless tobacco. She reports that she does not drink alcohol and does not use drugs.  Allergies:  Allergies  Allergen Reactions   Nitrofurantoin     Makes mouth break out/blisters   Propoxyphene N-Acetaminophen     REACTION: nausea   Nexium [Esomeprazole Magnesium] Nausea And Vomiting   Nortriptyline Hcl Nausea And Vomiting   Hydrocodone Bit-Homatrop Mbr Itching and Rash   Morphine Itching and Rash    Medications Prior to Admission  Medication Sig Dispense Refill   FLUoxetine (PROZAC) 20 MG capsule TAKE 1 CAPSULE BY MOUTH EVERY DAY 90 capsule 2   furosemide (LASIX) 20 MG tablet TAKE 1 TABLET BY MOUTH EVERY DAY 90 tablet 1   latanoprost (XALATAN) 0.005 % ophthalmic solution Place 1 drop into both eyes at bedtime.      losartan-hydrochlorothiazide (HYZAAR) 50-12.5 MG tablet TAKE 1 TABLET BY MOUTH EVERY DAY 90 tablet 1   potassium chloride SA (KLOR-CON M) 20 MEQ tablet  Take 1 tablet (20 mEq total) by mouth daily. 90 tablet 1   pramipexole (MIRAPEX) 1 MG tablet TAKE 1 TABLET BY MOUTH TWICE A DAY 180 tablet 1   tiZANidine (ZANAFLEX) 4 MG tablet TAKE 1 TABLET BY MOUTH THREE TIMES A DAY AS NEEDED FOR MUSCLE SPASM 45 tablet 1   Vitamin D, Ergocalciferol, (DRISDOL) 1.25 MG (50000 UNIT) CAPS capsule Take 1 capsule (50,000 Units total) by mouth every 7 (seven) days. 12 capsule 0    No results found for this or any previous visit (from the past 48 hour(s)). No results found.  Review of Systems no recent fever, chills, nausea, vomiting or changes in her appetite  Blood pressure 125/68, pulse 75, temperature (!) 97.5 F (36.4 C), temperature source Oral, resp. rate 14, height 5' 1.5" (1.562 m), weight 81.1 kg, SpO2 97 %. Physical Exam  Well-nourished well-developed woman in no apparent distress.  Alert and oriented x4.  Normal mood and affect.   Gait is normal.  The right foot has a prominent bunion deformity as well as a bunionette.  Central toes have hammertoe deformities.  The second is fixed.  The foot has healthy skin and palpable pulses.  Normal sensibility to light touch dorsally and plantarly at the forefoot.   Assessment/Plan Right foot bunion, second through fourth hammertoes and bunionette. -To the operating room today for surgical correction of these painful forefoot deformities.  The risks and benefits of the alternative treatment options have been discussed in detail.  The Byrd wishes to proceed with surgery and specifically understands risks of bleeding, infection, nerve damage, blood clots, need for additional surgery, amputation and death.   Wylene Simmer, MD 11-28-21, 7:10 AM

## 2021-11-03 NOTE — Telephone Encounter (Signed)
Good Afternoon Dr. Silverio Decamp,    We received a call from [facility] in regards to patient. Patient is wanting to transfer care to another provider here in our office. Patient is needing to have an EGD and colonoscopy. I advised the office that we had to have the approval from you and the requested provider. Sharmaine Base is going to call patient and see which provider she would like to transfer to.  Can you advise on patient transferring care to one of your colleagues?  Thank you.

## 2021-11-03 NOTE — Telephone Encounter (Signed)
Pt was hoping to establish w/ another GI doctor as her neighbor had a bad outcome w/ her current provider (perforated colon during colonoscopy).  It appears that [facility] and Dr Benson Norway have declined her referral.  Can we have her see another [place] provider?  She is very anxious about seeing her current provider given what happened to her neighbor and has been putting off procedures (EGD, colonoscopy) that she needs.

## 2021-11-03 NOTE — Anesthesia Procedure Notes (Signed)
Procedure Name: LMA Insertion Date/Time: 11/03/2021 7:34 AM  Performed by: Daiveon Markman, Ernesta Amble, CRNAPre-anesthesia Checklist: Patient identified, Emergency Drugs available, Suction available and Patient being monitored Patient Re-evaluated:Patient Re-evaluated prior to induction Oxygen Delivery Method: Circle system utilized Preoxygenation: Pre-oxygenation with 100% oxygen Induction Type: IV induction Ventilation: Mask ventilation without difficulty LMA: LMA inserted LMA Size: 4.0 Number of attempts: 1 Airway Equipment and Method: Bite block Placement Confirmation: positive ETCO2 Tube secured with: Tape Dental Injury: Teeth and Oropharynx as per pre-operative assessment

## 2021-11-03 NOTE — Telephone Encounter (Signed)
I have LM for the pt to call me back.  Spoke to [name] she is going to send a message back to current provider to get the ok for a TOC for this pt. I need to know who the pt would like to see if she knows of anyone.   Andrea Byrd

## 2021-11-03 NOTE — Telephone Encounter (Signed)
Nurse from Dr. Benson Norway office states he can not see this patient and she would need another  referral.

## 2021-11-03 NOTE — Discharge Instructions (Addendum)
May have tylenol again after 2 pm    Wylene Simmer, MD [facility]  Please read the following information regarding your care after surgery.  Medications  You only need a prescription for the narcotic pain medicine (ex. oxycodone, Percocet, Norco).  All of the other medicines listed below are available over the counter. X Aleve 2 pills twice a day for the first 3 days after surgery. X acetominophen (Tylenol) 650 mg every 4-6 hours as you need for minor to moderate pain X oxycodone as prescribed for severe pain  Narcotic pain medicine (ex. oxycodone, Percocet, Vicodin) will cause constipation.  To prevent this problem, take the following medicines while you are taking any pain medicine. X docusate sodium (Colace) 100 mg twice a day X senna (Senokot) 2 tablets twice a day  Weight Bearing X Bear weight only on your operated foot in the post-op shoe.  Cast / Splint / Dressing X  Keep your splint, cast or dressing clean and dry.  Don't put anything (coat hanger, pencil, etc) down inside of it.  If it gets damp, use a hair dryer on the cool setting to dry it.  If it gets soaked, call the office to schedule an appointment for a cast change.  After your dressing, cast or splint is removed; you may shower, but do not soak or scrub the wound.  Allow the water to run over it, and then gently pat it dry.  Swelling It is normal for you to have swelling where you had surgery.  To reduce swelling and pain, keep your toes above your nose for at least 3 days after surgery.  It may be necessary to keep your foot or leg elevated for several weeks.  If it hurts, it should be elevated.  Follow Up Call my office at [phone] when you are discharged from the hospital or surgery center to schedule an appointment to be seen two weeks after surgery.  Call my office at [phone] if you develop a fever >101.5 F, nausea, vomiting, bleeding from the surgical site or severe pain.      Post Anesthesia Home  Care Instructions  Activity: Get plenty of rest for the remainder of the day. A responsible individual must stay with you for 24 hours following the procedure.  For the next 24 hours, DO NOT: -Drive a car -Paediatric nurse -Drink alcoholic beverages -Take any medication unless instructed by your physician -Make any legal decisions or sign important papers.  Meals: Start with liquid foods such as gelatin or soup. Progress to regular foods as tolerated. Avoid greasy, spicy, heavy foods. If nausea and/or vomiting occur, drink only clear liquids until the nausea and/or vomiting subsides. Call your physician if vomiting continues.  Special Instructions/Symptoms: Your throat may feel dry or sore from the anesthesia or the breathing tube placed in your throat during surgery. If this causes discomfort, gargle with warm salt water. The discomfort should disappear within 24 hours.  If you had a scopolamine patch placed behind your ear for the management of post- operative nausea and/or vomiting:  1. The medication in the patch is effective for 72 hours, after which it should be removed.  Wrap patch in a tissue and discard in the trash. Wash hands thoroughly with soap and water. 2. You may remove the patch earlier than 72 hours if you experience unpleasant side effects which may include dry mouth, dizziness or visual disturbances. 3. Avoid touching the patch. Wash your hands with soap and water after contact with  the patch.     2. You may remove the patch earlier than 72 hours if you experience unpleasant side effects which may include dry mouth, dizziness or visual disturbances. 3. Avoid touching the patch. Wash your hands with soap and water after contact with the patch.      Regional Anesthesia Blocks  1. Numbness or the inability to move the "blocked" extremity may last from 3-48 hours after placement. The length of time depends on the medication injected and your individual response to the  medication. If the numbness is not going away after 48 hours, call your surgeon.  2. The extremity that is blocked will need to be protected until the numbness is gone and the  Strength has returned. Because you cannot feel it, you will need to take extra care to avoid injury. Because it may be weak, you may have difficulty moving it or using it. You may not know what position it is in without looking at it while the block is in effect.  3. For blocks in the legs and feet, returning to weight bearing and walking needs to be done carefully. You will need to wait until the numbness is entirely gone and the strength has returned. You should be able to move your leg and foot normally before you try and bear weight or walk. You will need someone to be with you when you first try to ensure you do not fall and possibly risk injury.  4. Bruising and tenderness at the needle site are common side effects and will resolve in a few days.  5. Persistent numbness or new problems with movement should be communicated to the surgeon or the [facility] [phone] [facility] [phone]).     Call your surgeon if you experience:   1.  Fever over 101.0. 2.  Inability to urinate. 3.  Nausea and/or vomiting. 4.  Extreme swelling or bruising at the surgical site. 5.  Continued bleeding from the incision. 6.  Increased pain, redness or drainage from the incision. 7.  Problems related to your pain medication. 8.  Any problems and/or concerns     Next dose of Tylenol due at 6:30 pm.

## 2021-11-04 NOTE — Progress Notes (Signed)
Left message stating courtesy call and if any questions or concerns please call the doctors office.  

## 2021-11-07 ENCOUNTER — Encounter (HOSPITAL_BASED_OUTPATIENT_CLINIC_OR_DEPARTMENT_OTHER): Payer: Self-pay | Admitting: Orthopedic Surgery

## 2021-11-08 NOTE — Telephone Encounter (Signed)
Called and informed pt that Dr Birdie Riddle feels confident in all providers available she was happy with this and just wants to be seen as soon as possible.

## 2021-11-08 NOTE — Telephone Encounter (Signed)
Did you recommend a specific provider for [name]?

## 2021-11-08 NOTE — Telephone Encounter (Signed)
.    Drs Bryan Lemma, [place], Danis, Pyrtle may have more availability

## 2021-11-08 NOTE — Telephone Encounter (Signed)
I think she would be in great hands w/ any of the providers.  Drs Bryan Lemma, [place], Danis, Pyrtle may have more availability

## 2021-11-08 NOTE — Telephone Encounter (Signed)
Pt would like to know who you recommend at [name] ?

## 2021-11-09 ENCOUNTER — Encounter (HOSPITAL_BASED_OUTPATIENT_CLINIC_OR_DEPARTMENT_OTHER): Payer: Self-pay | Admitting: Orthopedic Surgery

## 2021-11-16 NOTE — Telephone Encounter (Signed)
Its fine, think most of our schedules are booked until Nov

## 2021-11-23 ENCOUNTER — Encounter: Payer: Self-pay | Admitting: Family Medicine

## 2021-11-23 ENCOUNTER — Telehealth (INDEPENDENT_AMBULATORY_CARE_PROVIDER_SITE_OTHER): Payer: Medicare HMO | Admitting: Family Medicine

## 2021-11-23 DIAGNOSIS — R6889 Other general symptoms and signs: Secondary | ICD-10-CM | POA: Diagnosis not present

## 2021-11-23 MED ORDER — PROMETHAZINE-DM 6.25-15 MG/5ML PO SYRP: 5.0000 mL | ORAL_SOLUTION | Freq: Four times a day (QID) | ORAL | 0 refills | Status: DC | PRN

## 2021-11-23 MED ORDER — OSELTAMIVIR PHOSPHATE 75 MG PO CAPS: 75.0000 mg | ORAL_CAPSULE | Freq: Two times a day (BID) | ORAL | 0 refills | Status: DC

## 2021-11-23 NOTE — Progress Notes (Signed)
Virtual Visit via Video   I connected with patient on 11/23/21 at  9:40 AM EDT by a video enabled telemedicine application and verified that I am speaking with the correct person using two identifiers.  Location patient: Home Location provider: [facility], Office Persons participating in the virtual visit: Patient, Provider, [place] Marcille Blanco C)  I discussed the limitations of evaluation and management by telemedicine and the availability of in person appointments. The patient expressed understanding and agreed to proceed.  Subjective:   HPI:   Body aches- sxs started Tuesday afternoon w/ mild cough.  By Wednesday morning 'i couldn't get out of the bed'.  Reports severe joint pains, body aches, sweats and chills.  Skin is tender to touch.  Took COVID test this morning and it was negative.  No known sick contacts.  + productive cough.  No nausea or vomiting.  + 'major' headache.  No sore throat.  Pt reports she has had the flu in the past and this feels similar  ROS:   See pertinent positives and negatives per HPI.  Patient Active Problem List   Diagnosis Date Noted   Tailor's bunion of right foot 07/08/2021   Metatarsalgia of right foot 07/08/2021   Pain in joint of right shoulder 04/29/2021   Vitamin D deficiency 02/18/2021   Adjustment disorder 02/18/2021   Physical exam 11/14/2019   Pain in left knee 01/28/2018   Restless legs syndrome (RLS) 08/29/2016   Lumbar radiculopathy 08/29/2016   Obesity (BMI 30-39.9) 02/23/2016   Neuropathy 08/13/2012   Essential hypertension 02/05/2009   ARTHRITIS 02/05/2009   Dysphagia 02/05/2009   ESOPHAGEAL STRICTURE 02/04/2009   GERD 02/04/2009   GASTRITIS 02/04/2009   HIATAL HERNIA 02/04/2009   DIVERTICULOSIS OF COLON 02/04/2009   IRRITABLE BOWEL SYNDROME 02/04/2009   ENDOMETRIOSIS 02/04/2009    Social History   Tobacco Use   Smoking status: Never   Smokeless tobacco: Never  Substance Use Topics   Alcohol use: No     Current Outpatient Medications:    FLUoxetine (PROZAC) 20 MG capsule, TAKE 1 CAPSULE BY MOUTH EVERY DAY, Disp: 90 capsule, Rfl: 2   furosemide (LASIX) 20 MG tablet, TAKE 1 TABLET BY MOUTH EVERY DAY, Disp: 90 tablet, Rfl: 1   latanoprost (XALATAN) 0.005 % ophthalmic solution, Place 1 drop into both eyes at bedtime. , Disp: , Rfl:    losartan-hydrochlorothiazide (HYZAAR) 50-12.5 MG tablet, TAKE 1 TABLET BY MOUTH EVERY DAY, Disp: 90 tablet, Rfl: 1   potassium chloride SA (KLOR-CON M) 20 MEQ tablet, Take 1 tablet (20 mEq total) by mouth daily., Disp: 90 tablet, Rfl: 1   pramipexole (MIRAPEX) 1 MG tablet, TAKE 1 TABLET BY MOUTH TWICE A DAY, Disp: 180 tablet, Rfl: 1   tiZANidine (ZANAFLEX) 4 MG tablet, TAKE 1 TABLET BY MOUTH THREE TIMES A DAY AS NEEDED FOR MUSCLE SPASM, Disp: 45 tablet, Rfl: 1   Vitamin D, Ergocalciferol, (DRISDOL) 1.25 MG (50000 UNIT) CAPS capsule, Take 1 capsule (50,000 Units total) by mouth every 7 (seven) days., Disp: 12 capsule, Rfl: 0  Allergies  Allergen Reactions   Nitrofurantoin     Makes mouth break out/blisters   Propoxyphene N-Acetaminophen     REACTION: nausea   Nexium [Esomeprazole Magnesium] Nausea And Vomiting   Nortriptyline Hcl Nausea And Vomiting   Hydrocodone Bit-[place] Mbr Itching and Rash   Morphine Itching and Rash    Objective:   There were no vitals taken for this visit. AAOx3, obviously not feeling well NCAT, EOMI No  obvious CN deficits Coloring WNL Pt is able to speak clearly, coherently without shortness of breath or increased work of breathing.  + hacking cough Thought process is linear.  Mood is appropriate.   Assessment and Plan:   Flu like illness- new.  Pt's sxs are consistent w/ COVID or flu.  Given that she had a negative COVID test this morning and she reports this feels similar to previous episode of flu, will treat as flu.  Start Tamiflu '75mg'$  BID.  Promethazine cough syrup prn due to narcotic allergies.  Reviewed supportive  care- fluids, rest, tylenol/ibuprofen- and red flags- SOB, CP, worsening HA- that should prompt return.  Pt expressed understanding and is in agreement w/ plan.    [name], MD 11/23/2021

## 2021-11-24 ENCOUNTER — Other Ambulatory Visit: Payer: Self-pay | Admitting: Family Medicine

## 2021-11-25 ENCOUNTER — Encounter: Payer: Self-pay | Admitting: Family Medicine

## 2021-11-25 ENCOUNTER — Ambulatory Visit (INDEPENDENT_AMBULATORY_CARE_PROVIDER_SITE_OTHER): Payer: Medicare HMO | Admitting: Family Medicine

## 2021-11-25 VITALS — BP 120/58 | HR 80 | Temp 98.1°F | Resp 16 | Ht 61.5 in | Wt 178.4 lb

## 2021-11-25 DIAGNOSIS — R52 Pain, unspecified: Secondary | ICD-10-CM

## 2021-11-25 DIAGNOSIS — U071 COVID-19: Secondary | ICD-10-CM | POA: Diagnosis not present

## 2021-11-25 LAB — POCT INFLUENZA A/B
Influenza A, POC: NEGATIVE
Influenza B, POC: NEGATIVE

## 2021-11-25 LAB — POC COVID19 BINAXNOW: SARS Coronavirus 2 Ag: POSITIVE — AB

## 2021-11-25 MED ORDER — MOLNUPIRAVIR EUA 200MG CAPSULE: 4.0000 | ORAL_CAPSULE | Freq: Two times a day (BID) | ORAL | 0 refills | Status: AC

## 2021-11-25 NOTE — Patient Instructions (Signed)
Follow up as needed or as scheduled START the Molnupiravir as directed LOTS of fluids! LOTS of rest! Vit D, C, and Zinc help boost immunity Tylenol or ibuprofen for body aches and fever Robitussin or Delsym or Mucinex DM for cough Call with any questions or concerns Hang in there!!!

## 2021-11-25 NOTE — Progress Notes (Signed)
   Subjective:    Patient ID: Andrea Byrd, female    DOB: November 18, 1945, 76 y.o.   MRN: [id number]  HPI Body aches- sxs started Tuesday afternoon w/ cough.  Had video visit on Tuesday and was started on Tamiflu.  Thought she was getting better but then yesterday 'just ached all over'.  + cough, HA, nausea.  No vomiting.  + cough- productive.  No known sick contacts.   Review of Systems For ROS see HPI     Objective:   Physical Exam Vitals reviewed.  Constitutional:      General: She is not in acute distress.    Appearance: Normal appearance. She is well-developed.  HENT:     Head: Normocephalic and atraumatic.     Right Ear: Tympanic membrane and ear canal normal.     Left Ear: Tympanic membrane and ear canal normal.     Nose: Congestion present.     Mouth/Throat:     Mouth: Mucous membranes are moist.     Pharynx: No oropharyngeal exudate or posterior oropharyngeal erythema.  Eyes:     Conjunctiva/sclera: Conjunctivae normal.     Pupils: Pupils are equal, round, and reactive to light.  Cardiovascular:     Rate and Rhythm: Normal rate and regular rhythm.     Heart sounds: Normal heart sounds. No murmur heard. Pulmonary:     Effort: Pulmonary effort is normal. No respiratory distress.     Breath sounds: Normal breath sounds. No wheezing.     Comments: + hacking cough Musculoskeletal:     Cervical back: Normal range of motion and neck supple.  Lymphadenopathy:     Cervical: No cervical adenopathy.  Skin:    General: Skin is warm and dry.     Findings: No rash.  Neurological:     General: No focal deficit present.     Mental Status: She is alert and oriented to person, place, and time.  Psychiatric:        Mood and Affect: Mood normal.        Behavior: Behavior normal.        Thought Content: Thought content normal.           Assessment & Plan:   COVID- new.  Pt's test was immediately +.  Will start [place].  Reviewed supportive care and red flags that should  prompt return.  Pt expressed understanding and is in agreement w/ plan.

## 2021-12-05 ENCOUNTER — Telehealth: Payer: Self-pay | Admitting: Family Medicine

## 2021-12-05 NOTE — Telephone Encounter (Signed)
Caller name: Andrea Byrd   On DPR? :yes/no: Yes  Call back number: [phone]  Provider they see: Birdie Riddle  Reason for call: pt calling b/c covid positive last week; states that she does not seem to be getting any better; if anything, states seems to be getting worse.  Pt only wants to see Dr. Birdie Riddle; offered mychart appointment with Dr.  Carlota Raspberry or at another office, but pt declined both.

## 2021-12-05 NOTE — Telephone Encounter (Signed)
I would need to know which symptoms are worsening to determine if she needs to go to the ER.  If she feels she can wait until tomorrow, it would be best to discuss at appt

## 2021-12-05 NOTE — Telephone Encounter (Signed)
Spoke w/ pt and made her an apt for tomorrow . She states she has started with diarrhea again , body aches , coughing , chills one min then hot has not checked her temp . Body aches .

## 2021-12-05 NOTE — Telephone Encounter (Signed)
Will address at appt tomorrow.

## 2021-12-06 ENCOUNTER — Ambulatory Visit (INDEPENDENT_AMBULATORY_CARE_PROVIDER_SITE_OTHER): Payer: Medicare HMO | Admitting: Family Medicine

## 2021-12-06 ENCOUNTER — Encounter: Payer: Self-pay | Admitting: Family Medicine

## 2021-12-06 VITALS — BP 120/82 | HR 88 | Temp 98.3°F | Resp 17 | Ht 61.5 in | Wt 180.1 lb

## 2021-12-06 DIAGNOSIS — R52 Pain, unspecified: Secondary | ICD-10-CM | POA: Diagnosis not present

## 2021-12-06 DIAGNOSIS — R112 Nausea with vomiting, unspecified: Secondary | ICD-10-CM | POA: Diagnosis not present

## 2021-12-06 DIAGNOSIS — R5383 Other fatigue: Secondary | ICD-10-CM

## 2021-12-06 DIAGNOSIS — R197 Diarrhea, unspecified: Secondary | ICD-10-CM

## 2021-12-06 LAB — CBC WITH DIFFERENTIAL/PLATELET
Basophils Absolute: 0.1 10*3/uL (ref 0.0–0.1)
Basophils Relative: 0.9 % (ref 0.0–3.0)
Eosinophils Absolute: 0.1 10*3/uL (ref 0.0–0.7)
Eosinophils Relative: 1 % (ref 0.0–5.0)
HCT: 37.2 % (ref 36.0–46.0)
Hemoglobin: 12.5 g/dL (ref 12.0–15.0)
Lymphocytes Relative: 17.2 % (ref 12.0–46.0)
Lymphs Abs: 1.3 10*3/uL (ref 0.7–4.0)
MCHC: 33.7 g/dL (ref 30.0–36.0)
MCV: 86 fl (ref 78.0–100.0)
Monocytes Absolute: 0.5 10*3/uL (ref 0.1–1.0)
Monocytes Relative: 6.3 % (ref 3.0–12.0)
Neutro Abs: 5.5 10*3/uL (ref 1.4–7.7)
Neutrophils Relative %: 74.6 % (ref 43.0–77.0)
Platelets: 322 10*3/uL (ref 150.0–400.0)
RBC: 4.32 Mil/uL (ref 3.87–5.11)
RDW: 14.9 % (ref 11.5–15.5)
WBC: 7.4 10*3/uL (ref 4.0–10.5)

## 2021-12-06 LAB — TSH: TSH: 1.12 u[IU]/mL (ref 0.35–5.50)

## 2021-12-06 LAB — POCT INFLUENZA A/B
Influenza A, POC: NEGATIVE
Influenza B, POC: NEGATIVE

## 2021-12-06 MED ORDER — ONDANSETRON HCL 4 MG PO TABS: 4.0000 mg | ORAL_TABLET | Freq: Three times a day (TID) | ORAL | 0 refills | Status: DC | PRN

## 2021-12-06 MED ORDER — PROMETHAZINE-DM 6.25-15 MG/5ML PO SYRP: 5.0000 mL | ORAL_SOLUTION | Freq: Four times a day (QID) | ORAL | 0 refills | Status: DC | PRN

## 2021-12-06 NOTE — Progress Notes (Signed)
   Subjective:    Patient ID: Andrea Byrd, female    DOB: 1945/10/14, 76 y.o.   MRN: [id number]  HPI Body aches- pt tested + for COVID on 9/22.  Continues to have body aches, HA.  N/V/D- new.  Developed vomiting yesterday.  Also having diarrhea.  Attempting to drink fluids but struggling due to nausea  Fatigue- severe.  Pt reports she is struggling to do anything more than lie around.  Feels very weak.     Review of Systems For ROS see HPI     Objective:   Physical Exam Vitals reviewed.  Constitutional:      General: She is not in acute distress.    Appearance: She is ill-appearing.  HENT:     Head: Normocephalic and atraumatic.  Eyes:     Extraocular Movements: Extraocular movements intact.     Pupils: Pupils are equal, round, and reactive to light.  Cardiovascular:     Rate and Rhythm: Normal rate and regular rhythm.  Pulmonary:     Effort: Pulmonary effort is normal. No respiratory distress.     Breath sounds: Normal breath sounds. No wheezing or rhonchi.  Abdominal:     General: There is no distension.     Palpations: Abdomen is soft.     Tenderness: There is no abdominal tenderness. There is no guarding or rebound.  Musculoskeletal:     Cervical back: Neck supple.  Lymphadenopathy:     Cervical: No cervical adenopathy.  Skin:    General: Skin is warm and dry.     Coloration: Skin is pale.  Neurological:     General: No focal deficit present.     Mental Status: She is alert and oriented to person, place, and time.           Assessment & Plan:  Body aches- ongoing.  Flu test negative today.  Discussed that this was due to her COVID infxn and part of the viral process.  Encouraged her to alternate tylenol and ibuprofen to stay ahead of pain.   N/V/D- new.  Start Zofran prn.  Encouraged her to eat as she is able to help w/ energy level.  Stressed importance of fluids.  Check labs to assess electrolytes.  Fatigue- new.  Suspect this is COVID related but will  check labs to r/o underlying metabolic causes.  Pt expressed understanding and is in agreement w/ plan.

## 2021-12-06 NOTE — Patient Instructions (Addendum)
Follow up as needed or as scheduled We'll notify you of your lab results and make any changes if needed USE the cough syrup as needed- may cause some drowsiness TAKE the Ondansetron (Zofran) as needed for nausea Try and stay ahead of your fluids- water, gatorade, pedialyte, ginger ale, etc Try and eat as you are able- crackers, pretzels, applesauce, chicken soup I would like you to alternate Tylenol and Ibuprofen every 4 hrs while you are awake to try and stay ahead of the body aches Ice or heat- whichever improves the body aches REST!!! Call with any questions or concerns Hang in there!

## 2021-12-07 ENCOUNTER — Telehealth: Payer: Self-pay

## 2021-12-07 LAB — BASIC METABOLIC PANEL
BUN: 11 mg/dL (ref 6–23)
CO2: 31 mEq/L (ref 19–32)
Calcium: 9.9 mg/dL (ref 8.4–10.5)
Chloride: 101 mEq/L (ref 96–112)
Creatinine, Ser: 0.76 mg/dL (ref 0.40–1.20)
GFR: 76.52 mL/min (ref >60.00)
Glucose, Bld: 87 mg/dL (ref 70–99)
Potassium: 3.6 mEq/L (ref 3.5–5.1)
Sodium: 142 mEq/L (ref 135–145)

## 2021-12-07 LAB — HEPATIC FUNCTION PANEL
ALT: 11 U/L (ref 0–35)
AST: 16 U/L (ref 0–37)
Albumin: 4 g/dL (ref 3.5–5.2)
Alkaline Phosphatase: 111 U/L (ref 39–117)
Bilirubin, Direct: 0.1 mg/dL (ref 0.0–0.3)
Total Bilirubin: 0.4 mg/dL (ref 0.2–1.2)
Total Protein: 6.9 g/dL (ref 6.0–8.3)

## 2021-12-07 LAB — LIPASE: Lipase: 21 U/L (ref 11.0–59.0)

## 2021-12-07 NOTE — Telephone Encounter (Signed)
-----   Message from [name], MD sent at 12/07/2021  3:28 PM EDT ----- Labs look fantastic!  This confirms viral process as there is no high white blood cell count consistent w/ bacterial infection.  Continue to drink lots of fluids, REST, and allow yourself time to recover

## 2021-12-07 NOTE — Telephone Encounter (Signed)
Spoke to patient and let her know what Dr Birdie Riddle advised regarding her labs. She voiced understanding

## 2021-12-14 ENCOUNTER — Other Ambulatory Visit: Payer: Self-pay | Admitting: Family Medicine

## 2021-12-14 DIAGNOSIS — Z4889 Encounter for other specified surgical aftercare: Secondary | ICD-10-CM | POA: Diagnosis not present

## 2021-12-14 DIAGNOSIS — M7741 Metatarsalgia, right foot: Secondary | ICD-10-CM | POA: Diagnosis not present

## 2022-01-13 DIAGNOSIS — T8484XA Pain due to internal orthopedic prosthetic devices, implants and grafts, initial encounter: Secondary | ICD-10-CM | POA: Diagnosis not present

## 2022-01-13 DIAGNOSIS — M7741 Metatarsalgia, right foot: Secondary | ICD-10-CM | POA: Diagnosis not present

## 2022-01-19 ENCOUNTER — Encounter: Payer: Self-pay | Admitting: Physician Assistant

## 2022-01-19 ENCOUNTER — Ambulatory Visit: Payer: Medicare HMO | Admitting: Physician Assistant

## 2022-01-19 VITALS — BP 140/80 | HR 82 | Ht 61.5 in | Wt 180.4 lb

## 2022-01-19 DIAGNOSIS — K580 Irritable bowel syndrome with diarrhea: Secondary | ICD-10-CM

## 2022-01-19 DIAGNOSIS — R1310 Dysphagia, unspecified: Secondary | ICD-10-CM | POA: Diagnosis not present

## 2022-01-19 DIAGNOSIS — Z1211 Encounter for screening for malignant neoplasm of colon: Secondary | ICD-10-CM

## 2022-01-19 DIAGNOSIS — K219 Gastro-esophageal reflux disease without esophagitis: Secondary | ICD-10-CM

## 2022-01-19 DIAGNOSIS — Z1212 Encounter for screening for malignant neoplasm of rectum: Secondary | ICD-10-CM

## 2022-01-19 MED ORDER — NA SULFATE-K SULFATE-MG SULF 17.5-3.13-1.6 GM/177ML PO SOLN: 1.0000 | Freq: Once | ORAL | 0 refills | Status: AC

## 2022-01-19 MED ORDER — OMEPRAZOLE 20 MG PO CPDR: 20.0000 mg | DELAYED_RELEASE_CAPSULE | Freq: Two times a day (BID) | ORAL | 5 refills | Status: DC

## 2022-01-19 MED ORDER — DICYCLOMINE HCL 10 MG PO CAPS: 10.0000 mg | ORAL_CAPSULE | ORAL | 5 refills | Status: DC | PRN

## 2022-01-19 NOTE — Patient Instructions (Addendum)
If you are age 75 or older, your body mass index should be between 23-30. Your Body mass index is 33.53 kg/m. If this is out of the aforementioned range listed, please consider follow up with your Primary Care Provider.  If you are age 29 or younger, your body mass index should be between 19-25. Your Body mass index is 33.53 kg/m. If this is out of the aformentioned range listed, please consider follow up with your Primary Care Provider.   We have sent the following medications to your pharmacy for you to pick up at your convenience: Omeprazole 20 mg twice daily  You have been scheduled for an endoscopy and colonoscopy. Please follow the written instructions given to you at your visit today. Please pick up your prep supplies at the pharmacy within the next 1-3 days. If you use inhalers (even only as needed), please bring them with you on the day of your procedure.   The [facility] providers would like to encourage you to use [facility] to communicate with providers for non-urgent requests or questions.  Due to long hold times on the telephone, sending your provider a message by [facility] may be a faster and more efficient way to get a response.  Please allow 48 business hours for a response.  Please remember that this is for non-urgent requests.   It was a pleasure to see you today!  Thank you for trusting me with your gastrointestinal care!

## 2022-01-19 NOTE — Progress Notes (Signed)
Chief Complaint: Discuss colonoscopy and EGD  HPI:     Andrea Byrd is a 76 year old Caucasian female with a past medical history as listed below including reflux and previous esophageal stricture as well as IBS, previously known to Dr. Silverio Decamp who requested to switch her care to a different provider, who was referred to me by [name], MD for a complaint of dysphagia and need for a colonoscopy.     02/09/2012 colonoscopy with diverticulosis in sigmoid anastomosis, unable to retroflex in the rectum due to small rectal vault, repeat recommended 10 years.    08/21/2016 EGD with LA grade C esophagitis, esophageal stricture dilated with 20 mm TTS balloon and gastritis.    08/25/2019 office visit with Dr. Silverio Decamp.  At that time was there for follow-up of GERD and right lower quadrant abdominal pain.  Continued with some persistent lower abdominal pain.  Discussed CT with moderate hiatal hernia, sigmoid diverticulosis and multilevel degenerative changes in the lumbar spine.    Today, the patient tells me that she is having trouble swallowing again.  Typically this is with solid foods but very occasionally feels like she cannot swallow liquids either but if she waits long enough she can swallow them down.  Denies any heartburn or reflux symptoms.  Tells me she has had to have EGDs before with dilation but its been a long time.    Also discusses that she needs a repeat colonoscopy today.  It has been 10 years since her last one.  Tells me she does have chronic IBS symptoms which flare, typically resulting in some left lower quadrant spasming.  In the past does use Dicyclomine for this and found it helpful but she has not had any for a while.    Denies fever, chills, weight loss, blood in her stool or symptoms that awaken her from sleep.  Past Medical History:  Diagnosis Date   Anemia    Arthritis    Cervical cancer ([place]) 1977   Diverticulosis    Endometriosis    Esophageal stricture     Gastritis    GERD (gastroesophageal reflux disease)    Glaucoma    Herpes simplex type 1 infection    Hiatal hernia    History of colon polyps    History of gallstones    Hypertension    IBS (irritable bowel syndrome)    Insomnia    Post-operative nausea and vomiting    Restless leg syndrome     Past Surgical History:  Procedure Laterality Date   ABDOMINAL HYSTERECTOMY  1998   APPENDECTOMY  1998   BUNIONECTOMY WITH HAMMERTOE RECONSTRUCTION Right 11/03/2021   Procedure: Right bunion correction with double osteotomy; Second Metatarsal weil; Second hammertoe correction; third through fifth tendon lengthening; excision of bunionette;  Surgeon: Wylene Simmer, MD;  Location: [facility];  Service: Orthopedics;  Laterality: Right;   CERVICAL CONE BIOPSY     CHOLECYSTECTOMY  1980   COLON RESECTION  2002   COLON SURGERY  2003   removed 2 feet colon   ESOPHAGEAL MANOMETRY N/A 04/24/2016   Procedure: ESOPHAGEAL MANOMETRY (EM);  Surgeon: Mauri Pole, MD;  Location: [facility];  Service: Endoscopy;  Laterality: N/A;   HAND SURGERY     right/ thumb joints replaced both hands   OOPHORECTOMY     TUBAL LIGATION      Current Outpatient Medications  Medication Sig Dispense Refill   FLUoxetine (PROZAC) 20 MG capsule TAKE 1 CAPSULE BY MOUTH EVERY DAY  90 capsule 2   furosemide (LASIX) 20 MG tablet TAKE 1 TABLET BY MOUTH EVERY DAY 90 tablet 1   latanoprost (XALATAN) 0.005 % ophthalmic solution Place 1 drop into both eyes at bedtime.      losartan-hydrochlorothiazide (HYZAAR) 50-12.5 MG tablet TAKE 1 TABLET BY MOUTH EVERY DAY 90 tablet 1   potassium chloride SA (KLOR-CON M) 20 MEQ tablet Take 1 tablet (20 mEq total) by mouth daily. 90 tablet 1   pramipexole (MIRAPEX) 1 MG tablet TAKE 1 TABLET BY MOUTH TWICE A DAY 180 tablet 1   tiZANidine (ZANAFLEX) 4 MG tablet TAKE 1 TABLET BY MOUTH THREE TIMES A DAY AS NEEDED FOR MUSCLE SPASM 45 tablet 1   Vitamin D, Ergocalciferol, (DRISDOL)  1.25 MG (50000 UNIT) CAPS capsule Take 1 capsule (50,000 Units total) by mouth every 7 (seven) days. 12 capsule 0   No current facility-administered medications for this visit.    Allergies as of 01/19/2022 - Review Complete 01/19/2022  Allergen Reaction Noted   Nitrofurantoin  02/04/2009   Propoxyphene n-acetaminophen  02/04/2009   Nexium [esomeprazole magnesium] Nausea And Vomiting 08/29/2016   Nortriptyline hcl Nausea And Vomiting 08/29/2016   Hydrocodone bit-homatrop mbr Itching and Rash 09/01/2015   Morphine Itching and Rash     Family History  Problem Relation Age of Onset   Diabetes Mother    Stroke Mother        brother, sister, MGM   Colon polyps Mother    Heart disease Father    Kidney disease Father    Colon polyps Father    Lung disease Father    Kidney cancer Sister    Stroke Sister    Diabetes Sister    Colon polyps Sister    Celiac disease Other        niece   Heart attack Paternal Grandfather    Colon cancer Neg Hx    Esophageal cancer Neg Hx    Rectal cancer Neg Hx    Stomach cancer Neg Hx     Social History   Socioeconomic History   Marital status: Widowed    Spouse name: Not on file   Number of children: 1   Years of education: Not on file   Highest education level: Not on file  Occupational History   Occupation: Retired  Tobacco Use   Smoking status: Never   Smokeless tobacco: Never  Vaping Use   Vaping Use: Never used  Substance and Sexual Activity   Alcohol use: No   Drug use: No   Sexual activity: Not on file  Other Topics Concern   Not on file  Social History Narrative   Lives with husband floyd   Drinks maybe 4 sodas a week   Social Determinants of Health   Financial Resource Strain: Low Risk  (05/12/2021)   Overall Financial Resource Strain (CARDIA)    Difficulty of Paying Living Expenses: Not hard at all  Food Insecurity: No Food Insecurity (05/12/2021)   Hunger Vital Sign    Worried About Running Out of Food in the Last  Year: Never true    [place] in the Last Year: Never true  Transportation Needs: No Transportation Needs (05/12/2021)   PRAPARE - Hydrologist (Medical): No    Lack of Transportation (Non-Medical): No  Physical Activity: Insufficiently Active (05/12/2021)   Exercise Vital Sign    Days of Exercise per Week: 2 days    Minutes of Exercise per Session:  30 min  Stress: No Stress Concern Present (05/12/2021)   [place]    Feeling of Stress : Not at all  Social Connections: Moderately Integrated (05/12/2021)   Social Connection and Isolation Panel [NHANES]    Frequency of Communication with Friends and Family: Twice a week    Frequency of Social Gatherings with Friends and Family: Twice a week    Attends Religious Services: 1 to 4 times per year    Active Member of [employer] or Organizations: No    Attends Archivist Meetings: Never    Marital Status: Married  Human resources officer Violence: Not At Risk (05/12/2021)   Humiliation, Afraid, Rape, and Kick questionnaire    Fear of Current or Ex-Partner: No    Emotionally Abused: No    Physically Abused: No    Sexually Abused: No    Review of Systems:    Constitutional: No weight loss, fever or chills Cardiovascular: No chest pain Respiratory: No SOB  Gastrointestinal: See HPI and otherwise negative   Physical Exam:  Vital signs: BP (!) 140/80 (BP Location: Left Arm, Patient Position: Sitting, Cuff Size: Normal)   Pulse 82   Ht 5' 1.5" (1.562 m)   Wt 180 lb 6.4 oz (81.8 kg)   SpO2 98%   BMI 33.53 kg/m   Constitutional:   Pleasant Elderly Caucasian female appears to be in NAD, Well developed, Well nourished, alert and cooperative Respiratory: Respirations even and unlabored. Lungs clear to auscultation bilaterally.   No wheezes, crackles, or rhonchi.  Cardiovascular: Normal S1, S2. No MRG. Regular rate and rhythm. No peripheral edema,  cyanosis or pallor.  Gastrointestinal:  Soft, nondistended, nontender. No rebound or guarding. Normal bowel sounds. No appreciable masses or hepatomegaly. Rectal:  Not performed.  Psychiatric: Demonstrates good judgement and reason without abnormal affect or behaviors.  RELEVANT LABS AND IMAGING: CBC    Component Value Date/Time   WBC 7.4 12/06/2021 1128   RBC 4.32 12/06/2021 1128   HGB 12.5 12/06/2021 1128   HCT 37.2 12/06/2021 1128   PLT 322.0 12/06/2021 1128   MCV 86.0 12/06/2021 1128   MCH 29.5 10/05/2017 1535   MCHC 33.7 12/06/2021 1128   RDW 14.9 12/06/2021 1128   LYMPHSABS 1.3 12/06/2021 1128   MONOABS 0.5 12/06/2021 1128   EOSABS 0.1 12/06/2021 1128   BASOSABS 0.1 12/06/2021 1128    CMP     Component Value Date/Time   NA 142 12/06/2021 1128   NA 141 10/11/2015 0000   K 3.6 12/06/2021 1128   CL 101 12/06/2021 1128   CO2 31 12/06/2021 1128   GLUCOSE 87 12/06/2021 1128   BUN 11 12/06/2021 1128   BUN 13 10/11/2015 0000   CREATININE 0.76 12/06/2021 1128   CREATININE 0.88 08/06/2017 1651   CALCIUM 9.9 12/06/2021 1128   PROT 6.9 12/06/2021 1128   ALBUMIN 4.0 12/06/2021 1128   AST 16 12/06/2021 1128   ALT 11 12/06/2021 1128   ALKPHOS 111 12/06/2021 1128   BILITOT 0.4 12/06/2021 1128   GFRNONAA >60 10/31/2021 1257   GFRAA >60 10/05/2017 1535    Assessment: 1.  Dysphagia: History of esophageal stricture last dilated in 2018; likely repeat stricture 2.  Screening for colorectal cancer: Has been 10 years since patient last colonoscopy, she is due now 3.  IBS with diarrhea: Occasionally has flares of left lower quadrant spasming which results in liquid stool, this can occur all day long for couple of days and  then may not happen for a while, previously on Dicyclomine as needed which is helpful  Plan: 1.  Refilled Dicyclomine 10 mg every 4-6 hours as needed for abdominal spasming.  #30 with 11 refills. 2.  Scheduled patient for diagnostic EGD with dilation and  screening colonoscopy in the [facility].  Patient requested to switch from Dr. Silverio Decamp and was told she could on her end.  She was scheduled with Dr. Tarri Glenn today as she had availability before the end of the year and patient tells me she has been waiting all year for an appointment.  Did provide the patient a detailed list of risks for the procedure and she agrees to proceed.  She will continue to follow with Dr. Tarri Glenn after procedure as her primary GI physician here. 3.  Started the patient on Omeprazole 20 mg twice daily, 30-60 minutes before breakfast and dinner.  #60 with 5 refills. 4.  Patient to follow in clinic per recommendations after time of procedures.  Ellouise Newer, PA-C [facility] 01/19/2022, 8:59 AM  Cc: [name], MD

## 2022-01-21 ENCOUNTER — Other Ambulatory Visit: Payer: Self-pay | Admitting: Family Medicine

## 2022-01-30 DIAGNOSIS — M25511 Pain in right shoulder: Secondary | ICD-10-CM | POA: Diagnosis not present

## 2022-02-01 DIAGNOSIS — M25511 Pain in right shoulder: Secondary | ICD-10-CM | POA: Diagnosis not present

## 2022-02-08 ENCOUNTER — Encounter: Payer: Self-pay | Admitting: Gastroenterology

## 2022-02-10 DIAGNOSIS — M2041 Other hammer toe(s) (acquired), right foot: Secondary | ICD-10-CM | POA: Diagnosis not present

## 2022-02-10 DIAGNOSIS — M7741 Metatarsalgia, right foot: Secondary | ICD-10-CM | POA: Diagnosis not present

## 2022-02-13 DIAGNOSIS — M25511 Pain in right shoulder: Secondary | ICD-10-CM | POA: Diagnosis not present

## 2022-02-15 ENCOUNTER — Telehealth: Payer: Self-pay | Admitting: *Deleted

## 2022-02-15 NOTE — Telephone Encounter (Signed)
Called pt back to give her instructions per MD recommendations. Instructed her to purchase 119g of Miralax and 32 oz of Gatorade. Gave her instructions over the phone on how to complete prep and NPO time. Pt verbalized understanding. Instructed pt on what stool should look like and if it isn't clear yellow liquid after completing prep tomorrow morning to call back to let us know.

## 2022-02-15 NOTE — Telephone Encounter (Signed)
Pt called stating she accidentally drank her prep last night and this morning which was a day early. She is scheduled for tomorrow at 3pm for an EGD and Colonoscopy. States her stools are clear yellow liquid. Instructed pt to stay on clear liquids today. RN will reach out to MD to see if pt should do more prep today and tomorrow morning and will call pt back with recommendations. MD please advise.

## 2022-02-16 ENCOUNTER — Ambulatory Visit (AMBULATORY_SURGERY_CENTER): Payer: Medicare HMO | Admitting: Gastroenterology

## 2022-02-16 ENCOUNTER — Encounter: Payer: Self-pay | Admitting: Gastroenterology

## 2022-02-16 VITALS — BP 126/65 | HR 77 | Temp 97.1°F | Resp 16 | Ht 61.0 in | Wt 180.0 lb

## 2022-02-16 DIAGNOSIS — K449 Diaphragmatic hernia without obstruction or gangrene: Secondary | ICD-10-CM

## 2022-02-16 DIAGNOSIS — D122 Benign neoplasm of ascending colon: Secondary | ICD-10-CM | POA: Diagnosis not present

## 2022-02-16 DIAGNOSIS — Z1212 Encounter for screening for malignant neoplasm of rectum: Secondary | ICD-10-CM | POA: Diagnosis not present

## 2022-02-16 DIAGNOSIS — D120 Benign neoplasm of cecum: Secondary | ICD-10-CM

## 2022-02-16 DIAGNOSIS — K219 Gastro-esophageal reflux disease without esophagitis: Secondary | ICD-10-CM | POA: Diagnosis not present

## 2022-02-16 DIAGNOSIS — D123 Benign neoplasm of transverse colon: Secondary | ICD-10-CM

## 2022-02-16 DIAGNOSIS — K222 Esophageal obstruction: Secondary | ICD-10-CM | POA: Diagnosis not present

## 2022-02-16 DIAGNOSIS — I10 Essential (primary) hypertension: Secondary | ICD-10-CM | POA: Diagnosis not present

## 2022-02-16 DIAGNOSIS — Z1211 Encounter for screening for malignant neoplasm of colon: Secondary | ICD-10-CM | POA: Diagnosis not present

## 2022-02-16 DIAGNOSIS — K5140 Inflammatory polyps of colon without complications: Secondary | ICD-10-CM | POA: Diagnosis not present

## 2022-02-16 DIAGNOSIS — R1310 Dysphagia, unspecified: Secondary | ICD-10-CM | POA: Diagnosis not present

## 2022-02-16 DIAGNOSIS — G2581 Restless legs syndrome: Secondary | ICD-10-CM | POA: Diagnosis not present

## 2022-02-16 MED ORDER — SODIUM CHLORIDE 0.9 % IV SOLN: 500.0000 mL | Freq: Once | INTRAVENOUS | Status: DC

## 2022-02-16 NOTE — Patient Instructions (Addendum)
- Patient has a contact number available for emergencies. The signs and symptoms of potential delayed complications were discussed with the patient. Return to normal activities tomorrow. Written discharge instructions were provided to the patient. - Continue present medications. - Await pathology results. - Repeat colonoscopy is not recommended due to current age (46 years or older) for surveillance. - Follow a high fiber diet. Drink at least 64 ounces of water daily. Add a daily stool bulking agent such as psyllium (an exampled would be Metamucil). - Emerging evidence supports eating a diet of fruits, vegetables, grains, calcium, and yogurt while reducing red meat and alcohol may reduce the risk of colon cancer. - Thank you for allowing me to be involved in your colon cancer prevention. - Repeat EGD with additional dilation if symptoms recur. -Handout on polyps, diverticulosis, high fiber diet  and hiatal hernia provided   YOU HAD AN ENDOSCOPIC PROCEDURE TODAY AT [facility]:   Refer to the procedure report that was given to you for any specific questions about what was found during the examination.  If the procedure report does not answer your questions, please call your gastroenterologist to clarify.  If you requested that your care partner not be given the details of your procedure findings, then the procedure report has been included in a sealed envelope for you to review at your convenience later.  YOU SHOULD EXPECT: Some feelings of bloating in the abdomen. Passage of more gas than usual.  Walking can help get rid of the air that was put into your GI tract during the procedure and reduce the bloating. If you had a lower endoscopy (such as a colonoscopy or flexible sigmoidoscopy) you may notice spotting of blood in your stool or on the toilet paper. If you underwent a bowel prep for your procedure, you may not have a normal bowel movement for a few days.  Please Note:  You might  notice some irritation and congestion in your nose or some drainage.  This is from the oxygen used during your procedure.  There is no need for concern and it should clear up in a day or so.  SYMPTOMS TO REPORT IMMEDIATELY:  Following lower endoscopy (colonoscopy or flexible sigmoidoscopy):  Excessive amounts of blood in the stool  Significant tenderness or worsening of abdominal pains  Swelling of the abdomen that is new, acute  Fever of 100F or higher  Following upper endoscopy (EGD)  Vomiting of blood or coffee ground material  New chest pain or pain under the shoulder blades  Painful or persistently difficult swallowing  New shortness of breath  Fever of 100F or higher  Black, tarry-looking stools  For urgent or emergent issues, a gastroenterologist can be reached at any hour by calling [phone]. Do not use MyChart messaging for urgent concerns.    DIET:  We do recommend a small meal at first, but then you may proceed to your regular diet.  Drink plenty of fluids but you should avoid alcoholic beverages for 24 hours.  ACTIVITY:  You should plan to take it easy for the rest of today and you should NOT DRIVE or use heavy machinery until tomorrow (because of the sedation medicines used during the test).    FOLLOW UP: Our staff will call the number listed on your records the next business day following your procedure.  We will call around 7:15- 8:00 am to check on you and address any questions or concerns that you may have regarding  the information given to you following your procedure. If we do not reach you, we will leave a message.     If any biopsies were taken you will be contacted by phone or by letter within the next 1-3 weeks.  Please call us at [phone] if you have not heard about the biopsies in 3 weeks.    SIGNATURES/CONFIDENTIALITY: You and/or your care partner have signed paperwork which will be entered into your electronic medical record.  These signatures  attest to the fact that that the information above on your After Visit Summary has been reviewed and is understood.  Full responsibility of the confidentiality of this discharge information lies with you and/or your care-partner.

## 2022-02-16 NOTE — Progress Notes (Signed)
Indication for upper endoscopy: Dysphagia, history of esophageal stricture last dilated in 2018 Indication for colonoscopy: Colon cancer screening, last colonoscopy 10 years ago  Please see the 01/19/2022 note for complete details.  There is been no significant change in history or physical exam since that time.  Patient remains an appropriate candidate for monitored anesthesia care in the [facility].

## 2022-02-16 NOTE — Op Note (Signed)
[name] Patient Name: Andrea Byrd Procedure Date: 02/16/2022 3:17 PM MRN: [id number] Endoscopist: Thornton Park MD, MD, [id number] Age: 76 Referring MD:  Date of Birth: 1946-02-11 Gender: Female Account #: [id number] Procedure:                Colonoscopy Indications:              Screening for colorectal malignant neoplasm                           Multiple prior colonoscopies, last 10 years ago                            with Dr. Olevia Perches Medicines:                Monitored Anesthesia Care Procedure:                Pre-Anesthesia Assessment:                           - Prior to the procedure, a History and Physical                            was performed, and patient medications and                            allergies were reviewed. The patient's tolerance of                            previous anesthesia was also reviewed. The risks                            and benefits of the procedure and the sedation                            options and risks were discussed with the patient.                            All questions were answered, and informed consent                            was obtained. Prior Anticoagulants: The patient has                            taken no anticoagulant or antiplatelet agents. ASA                            Grade Assessment: II - A patient with mild systemic                            disease. After reviewing the risks and benefits,                            the patient was deemed in satisfactory condition to  undergo the procedure.                           After obtaining informed consent, the colonoscope                            was passed under direct vision. Throughout the                            procedure, the patient's blood pressure, pulse, and                            oxygen saturations were monitored continuously. The                            Olympus Scope [id number] was introduced through the                             anus and advanced to the 3 cm into the ileum. The                            colonoscopy was performed without difficulty. The                            patient tolerated the procedure well. The quality                            of the bowel preparation was good. The ileocecal                            valve, appendiceal orifice, and rectum were                            photographed. Scope In: 3:36:09 PM Scope Out: 3:53:16 PM Scope Withdrawal Time: 0 hours 14 minutes 32 seconds  Total Procedure Duration: 0 hours 17 minutes 7 seconds  Findings:                 The perianal and digital rectal examinations were                            normal.                           Multiple medium-mouthed and small-mouthed                            diverticula were found in the entire colon.                           A 5 mm polyp was found in the transverse colon. The                            polyp was sessile. The polyp was removed with a  cold snare. Resection and retrieval were complete.                            Estimated blood loss was minimal.                           Two sessile polyps were found in the ascending                            colon and cecum. The polyps were 2 to 3 mm in size.                            These polyps were removed with a cold snare.                            Resection and retrieval were complete. Estimated                            blood loss was minimal.                           The exam was otherwise without abnormality on                            direct and retroflexion views. Complications:            No immediate complications. Estimated Blood Loss:     Estimated blood loss was minimal. Impression:               - Diverticulosis in the entire examined colon.                           - One 5 mm polyp in the transverse colon, removed                            with a cold snare. Resected and  retrieved.                           - Two 2 to 3 mm polyps in the ascending colon and                            in the cecum, removed with a cold snare. Resected                            and retrieved.                           - The examination was otherwise normal on direct                            and retroflexion views. Recommendation:           - Patient has a contact number available for  emergencies. The signs and symptoms of potential                            delayed complications were discussed with the                            patient. Return to normal activities tomorrow.                            Written discharge instructions were provided to the                            patient.                           - Continue present medications.                           - Await pathology results.                           - Repeat colonoscopy is not recommended due to                            current age (75 years or older) for surveillance.                           - Follow a high fiber diet. Drink at least 64                            ounces of water daily. Add a daily stool bulking                            agent such as psyllium (an exampled would be                            Metamucil).                           - Emerging evidence supports eating a diet of                            fruits, vegetables, grains, calcium, and yogurt                            while reducing red meat and alcohol may reduce the                            risk of colon cancer.                           - Thank you for allowing me to be involved in your                            colon cancer prevention.  Thornton Park MD, MD 02/16/2022 4:01:40 PM This report has been signed electronically.

## 2022-02-16 NOTE — Progress Notes (Signed)
Report to PACU, RN, vss, BBS= Clear.  

## 2022-02-16 NOTE — Progress Notes (Signed)
Pt's states no medical or surgical changes since previsit or office visit. 

## 2022-02-16 NOTE — Op Note (Signed)
[facility] Patient Name: Andrea Byrd Procedure Date: 02/16/2022 3:17 PM MRN: [id number] Endoscopist: Thornton Park MD, MD, [id number] Age: 76 Referring MD:  Date of Birth: November 28, 1945 Gender: Female Account #: [id number] Procedure:                Upper GI endoscopy Indications:              Dysphagia Medicines:                Monitored Anesthesia Care Procedure:                Pre-Anesthesia Assessment:                           - Prior to the procedure, a History and Physical                            was performed, and patient medications and                            allergies were reviewed. The patient's tolerance of                            previous anesthesia was also reviewed. The risks                            and benefits of the procedure and the sedation                            options and risks were discussed with the patient.                            All questions were answered, and informed consent                            was obtained. Prior Anticoagulants: The patient has                            taken no anticoagulant or antiplatelet agents. ASA                            Grade Assessment: II - A patient with mild systemic                            disease. After reviewing the risks and benefits,                            the patient was deemed in satisfactory condition to                            undergo the procedure.                           After obtaining informed consent, the endoscope was  passed under direct vision. Throughout the                            procedure, the patient's blood pressure, pulse, and                            oxygen saturations were monitored continuously. The                            GIF [id number] #[id number] was introduced through the                            mouth, and advanced to the third part of duodenum.                            The upper GI endoscopy was accomplished  without                            difficulty. The patient tolerated the procedure                            well. Scope In: Scope Out: Findings:                 One benign-appearing, intrinsic moderate                            (circumferential scarring or stenosis; an endoscope                            may pass) stenosis was found from 34 to 35 cm from                            the incisors. The stenosis was traversed. A TTS                            dilator was passed through the scope. Dilation with                            a 15-16.5-18 mm balloon dilator was performed to 18                            mm. There was minimal resistance to a fully                            inflated balloon. The dilation site was examined                            and showed mild mucosal disruption. After dilation,                            biopsies were taken with a cold forceps for  histology. Estimated blood loss was minimal.                           A medium-sized hiatal hernia was present.                           The stomach was normal.                           The examined duodenum was normal. Complications:            No immediate complications. Estimated Blood Loss:     Estimated blood loss was minimal. Impression:               - Benign-appearing esophageal stricture. Dilated.                            Biopsied.                           - Medium-sized hiatal hernia.                           - Normal stomach.                           - Normal examined duodenum. Recommendation:           - Patient has a contact number available for                            emergencies. The signs and symptoms of potential                            delayed complications were discussed with the                            patient. Return to normal activities tomorrow.                            Written discharge instructions were provided to the                             patient.                           - Resume previous diet.                           - Continue present medications.                           - Await pathology results.                           - Repeat EGD with additional dilation if symptoms                            recur. Thornton Park MD,  MD 02/16/2022 3:57:43 PM This report has been signed electronically.

## 2022-02-16 NOTE — Progress Notes (Signed)
Called to room to assist during endoscopic procedure.  Patient ID and intended procedure confirmed with present staff. Received instructions for my participation in the procedure from the performing physician.  

## 2022-02-17 ENCOUNTER — Telehealth: Payer: Self-pay

## 2022-02-17 DIAGNOSIS — H26492 Other secondary cataract, left eye: Secondary | ICD-10-CM | POA: Diagnosis not present

## 2022-02-17 DIAGNOSIS — H401231 Low-tension glaucoma, bilateral, mild stage: Secondary | ICD-10-CM | POA: Diagnosis not present

## 2022-02-17 NOTE — Telephone Encounter (Signed)
Attempted to reach patient for post-procedure f/u call. Line is busy, unable to leave message.

## 2022-02-22 ENCOUNTER — Ambulatory Visit (INDEPENDENT_AMBULATORY_CARE_PROVIDER_SITE_OTHER): Payer: Medicare HMO | Admitting: Family Medicine

## 2022-02-22 ENCOUNTER — Encounter: Payer: Self-pay | Admitting: Family Medicine

## 2022-02-22 VITALS — BP 124/72 | HR 69 | Temp 97.6°F | Resp 17 | Ht 61.0 in | Wt 183.4 lb

## 2022-02-22 DIAGNOSIS — I10 Essential (primary) hypertension: Secondary | ICD-10-CM

## 2022-02-22 DIAGNOSIS — E559 Vitamin D deficiency, unspecified: Secondary | ICD-10-CM | POA: Diagnosis not present

## 2022-02-22 DIAGNOSIS — Z0000 Encounter for general adult medical examination without abnormal findings: Secondary | ICD-10-CM | POA: Diagnosis not present

## 2022-02-22 LAB — BASIC METABOLIC PANEL
BUN: 12 mg/dL (ref 6–23)
CO2: 33 mEq/L — ABNORMAL HIGH (ref 19–32)
Calcium: 9.9 mg/dL (ref 8.4–10.5)
Chloride: 100 mEq/L (ref 96–112)
Creatinine, Ser: 0.86 mg/dL (ref 0.40–1.20)
GFR: 65.87 mL/min (ref >60.00)
Glucose, Bld: 93 mg/dL (ref 70–99)
Potassium: 3.6 mEq/L (ref 3.5–5.1)
Sodium: 140 mEq/L (ref 135–145)

## 2022-02-22 LAB — CBC WITH DIFFERENTIAL/PLATELET
Basophils Absolute: 0 10*3/uL (ref 0.0–0.1)
Basophils Relative: 0.9 % (ref 0.0–3.0)
Eosinophils Absolute: 0.1 10*3/uL (ref 0.0–0.7)
Eosinophils Relative: 2 % (ref 0.0–5.0)
HCT: 38.5 % (ref 36.0–46.0)
Hemoglobin: 12.9 g/dL (ref 12.0–15.0)
Lymphocytes Relative: 23.2 % (ref 12.0–46.0)
Lymphs Abs: 1.2 10*3/uL (ref 0.7–4.0)
MCHC: 33.5 g/dL (ref 30.0–36.0)
MCV: 86.9 fl (ref 78.0–100.0)
Monocytes Absolute: 0.4 10*3/uL (ref 0.1–1.0)
Monocytes Relative: 7.1 % (ref 3.0–12.0)
Neutro Abs: 3.5 10*3/uL (ref 1.4–7.7)
Neutrophils Relative %: 66.8 % (ref 43.0–77.0)
Platelets: 270 10*3/uL (ref 150.0–400.0)
RBC: 4.43 Mil/uL (ref 3.87–5.11)
RDW: 14.3 % (ref 11.5–15.5)
WBC: 5.3 10*3/uL (ref 4.0–10.5)

## 2022-02-22 LAB — HEPATIC FUNCTION PANEL
ALT: 13 U/L (ref 0–35)
AST: 19 U/L (ref 0–37)
Albumin: 4.2 g/dL (ref 3.5–5.2)
Alkaline Phosphatase: 109 U/L (ref 39–117)
Bilirubin, Direct: 0.1 mg/dL (ref 0.0–0.3)
Total Bilirubin: 0.5 mg/dL (ref 0.2–1.2)
Total Protein: 6.7 g/dL (ref 6.0–8.3)

## 2022-02-22 LAB — LIPID PANEL
Cholesterol: 174 mg/dL (ref 0–200)
HDL: 50.4 mg/dL (ref >39.00)
LDL Cholesterol: 99 mg/dL (ref 0–99)
NonHDL: 123.7
Total CHOL/HDL Ratio: 3
Triglycerides: 122 mg/dL (ref 0.0–149.0)
VLDL: 24.4 mg/dL (ref 0.0–40.0)

## 2022-02-22 LAB — VITAMIN D 25 HYDROXY (VIT D DEFICIENCY, FRACTURES): VITD: 26.09 ng/mL — ABNORMAL LOW (ref 30.00–100.00)

## 2022-02-22 LAB — TSH: TSH: 1.46 u[IU]/mL (ref 0.35–5.50)

## 2022-02-22 NOTE — Assessment & Plan Note (Signed)
Chronic problem.  Currently well controlled.  Asymptomatic.  Check labs due to ARB and diuretic use but no anticipated med changes.  Will follow.

## 2022-02-22 NOTE — Assessment & Plan Note (Signed)
Pt's PE WNL w/ exception of BMI.  UTD on mammo, colonoscopy, PNA.  Check labs.  Anticipatory guidance provided.

## 2022-02-22 NOTE — Progress Notes (Signed)
   Subjective:    Patient ID: Andrea Byrd, female    DOB: 03-Mar-1946, 76 y.o.   MRN: [id number]  HPI CPE- UTD on colonoscopy, mammo, PNA.  Pt reports feeling good 'most of the time'  Patient Care Team    Relationship Specialty Notifications Start End  [name], MD PCP - General Family Medicine  01/17/16   Mauri Pole, MD Consulting Physician Gastroenterology  02/23/16   Penni Bombard, MD Consulting Physician Neurology  02/23/16   Harriett Sine, MD Consulting Physician Dermatology  08/30/16   Manson Passey, [facility]  Specialist  08/30/16   Edythe Clarity, [facility]  Pharmacist  02/09/21    Comment: [phone]    Health Maintenance  Topic Date Due   Medicare Annual Wellness (AWV)  05/13/2022   COLONOSCOPY (Pts 45-50yr Insurance coverage will need to be confirmed)  02/17/2032   Pneumonia Vaccine 75 Years old  Completed   INFLUENZA VACCINE  Completed   DEXA SCAN  Completed   HPV VACCINES  Aged Out   DTaP/Tdap/Td  Discontinued   COVID-19 Vaccine  Discontinued   Hepatitis C Screening  Discontinued   Zoster Vaccines- Shingrix  Discontinued      Review of Systems Patient reports no vision/ hearing changes, adenopathy,fever, weight change,  persistant/recurrent hoarseness , swallowing issues, chest pain, palpitations, edema, persistant/recurrent cough, hemoptysis, dyspnea (rest/exertional/paroxysmal nocturnal), gastrointestinal bleeding (melena, rectal bleeding), abdominal pain, significant heartburn, bowel changes, GU symptoms (dysuria, hematuria, incontinence), Gyn symptoms (abnormal  bleeding, pain),  syncope, focal weakness, memory loss, skin/hair/nail changes, abnormal bruising or bleeding, anxiety, or depression.   + chronic neuropathy    Objective:   Physical Exam General Appearance:    Alert, cooperative, no distress, appears stated age  Head:    Normocephalic, without obvious abnormality, atraumatic  Eyes:    PERRL, conjunctiva/corneas clear, EOM's intact  both eyes  Ears:    Normal TM's and external ear canals, both ears  Nose:   Nares normal, septum midline, mucosa normal, no drainage    or sinus tenderness  Throat:   Lips, mucosa, and tongue normal; teeth and gums normal  Neck:   Supple, symmetrical, trachea midline, no adenopathy;    Thyroid: no enlargement/tenderness/nodules  Back:     Symmetric, no curvature, ROM normal, no CVA tenderness  Lungs:     Clear to auscultation bilaterally, respirations unlabored  Chest Wall:    No tenderness or deformity   Heart:    Regular rate and rhythm, S1 and S2 normal, no murmur, rub   or gallop  Breast Exam:    Deferred to mammo  Abdomen:     Soft, non-tender, bowel sounds active all four quadrants,    no masses, no organomegaly  Genitalia:    Deferred  Rectal:    Extremities:   Extremities normal, atraumatic, no cyanosis or edema  Pulses:   2+ and symmetric all extremities  Skin:   Skin color, texture, turgor normal, no rashes or lesions  Lymph nodes:   Cervical, supraclavicular, and axillary nodes normal  Neurologic:   CNII-XII intact, normal strength, sensation and reflexes    throughout          Assessment & Plan:

## 2022-02-22 NOTE — Patient Instructions (Signed)
Follow up in 6 months to recheck blood pressure We'll notify you of your lab results and make any changes if needed Continue to work on healthy diet and regular exercise- you look great!!! Call with any questions or concerns Stay Safe!  Stay Healthy! Happy Holidays!!!

## 2022-02-22 NOTE — Assessment & Plan Note (Signed)
Check labs and replete prn. 

## 2022-02-23 ENCOUNTER — Other Ambulatory Visit: Payer: Self-pay

## 2022-02-23 ENCOUNTER — Telehealth: Payer: Self-pay

## 2022-02-23 MED ORDER — VITAMIN D (ERGOCALCIFEROL) 1.25 MG (50000 UNIT) PO CAPS: 50000.0000 [IU] | ORAL_CAPSULE | ORAL | 12 refills | Status: DC

## 2022-02-23 NOTE — Telephone Encounter (Signed)
-----   Message from [name], MD sent at 02/23/2022  7:40 AM EST ----- Labs look great w/ exception of low Vit D.  Based on this, we need to start 50,000 units weekly x12 weeks in addition to daily OTC supplement of at least 2000 units.

## 2022-02-23 NOTE — Telephone Encounter (Signed)
Informed pt of lab results and Vit D 50,000 has been sent to pharmacy

## 2022-02-28 DIAGNOSIS — X58XXXA Exposure to other specified factors, initial encounter: Secondary | ICD-10-CM | POA: Diagnosis not present

## 2022-02-28 DIAGNOSIS — M19011 Primary osteoarthritis, right shoulder: Secondary | ICD-10-CM | POA: Diagnosis not present

## 2022-02-28 DIAGNOSIS — M7541 Impingement syndrome of right shoulder: Secondary | ICD-10-CM | POA: Diagnosis not present

## 2022-02-28 DIAGNOSIS — M75121 Complete rotator cuff tear or rupture of right shoulder, not specified as traumatic: Secondary | ICD-10-CM | POA: Diagnosis not present

## 2022-02-28 DIAGNOSIS — M94261 Chondromalacia, right knee: Secondary | ICD-10-CM | POA: Diagnosis not present

## 2022-02-28 DIAGNOSIS — M94211 Chondromalacia, right shoulder: Secondary | ICD-10-CM | POA: Diagnosis not present

## 2022-02-28 DIAGNOSIS — S43431A Superior glenoid labrum lesion of right shoulder, initial encounter: Secondary | ICD-10-CM | POA: Diagnosis not present

## 2022-02-28 DIAGNOSIS — G8918 Other acute postprocedural pain: Secondary | ICD-10-CM | POA: Diagnosis not present

## 2022-02-28 DIAGNOSIS — S46211A Strain of muscle, fascia and tendon of other parts of biceps, right arm, initial encounter: Secondary | ICD-10-CM | POA: Diagnosis not present

## 2022-02-28 DIAGNOSIS — M659 Synovitis and tenosynovitis, unspecified: Secondary | ICD-10-CM | POA: Diagnosis not present

## 2022-02-28 DIAGNOSIS — Y999 Unspecified external cause status: Secondary | ICD-10-CM | POA: Diagnosis not present

## 2022-02-28 DIAGNOSIS — M65811 Other synovitis and tenosynovitis, right shoulder: Secondary | ICD-10-CM | POA: Diagnosis not present

## 2022-02-28 HISTORY — PX: SHOULDER ARTHROSCOPY: SHX128

## 2022-03-02 ENCOUNTER — Other Ambulatory Visit: Payer: Self-pay | Admitting: Family Medicine

## 2022-03-02 NOTE — Telephone Encounter (Signed)
Patient is requesting a refill of the following medications: Requested Prescriptions   Pending Prescriptions Disp Refills   pramipexole (MIRAPEX) 1 MG tablet [Pharmacy Med Name: PRAMIPEXOLE 1 MG TABLET] 180 tablet 1    Sig: TAKE 1 TABLET BY MOUTH TWICE A DAY    Date of patient request: 03/02/22 Last office visit: 02/22/22 Date of last refill: 09/05/21 Last refill amount: 180

## 2022-03-02 NOTE — Telephone Encounter (Signed)
Mirapex for RLS, discussed at neuro visit in August.  Refill ordered.

## 2022-03-09 DIAGNOSIS — M25511 Pain in right shoulder: Secondary | ICD-10-CM | POA: Diagnosis not present

## 2022-03-09 DIAGNOSIS — M25611 Stiffness of right shoulder, not elsewhere classified: Secondary | ICD-10-CM | POA: Diagnosis not present

## 2022-03-09 NOTE — Progress Notes (Signed)
Reviewed and agree with management plans. ? ?Renay Crammer L. Anikin Prosser, MD, MPH  ?

## 2022-03-16 DIAGNOSIS — M25511 Pain in right shoulder: Secondary | ICD-10-CM | POA: Diagnosis not present

## 2022-03-16 DIAGNOSIS — M25611 Stiffness of right shoulder, not elsewhere classified: Secondary | ICD-10-CM | POA: Diagnosis not present

## 2022-03-17 ENCOUNTER — Other Ambulatory Visit: Payer: Self-pay | Admitting: Family Medicine

## 2022-03-17 DIAGNOSIS — Z1231 Encounter for screening mammogram for malignant neoplasm of breast: Secondary | ICD-10-CM

## 2022-03-23 DIAGNOSIS — M25511 Pain in right shoulder: Secondary | ICD-10-CM | POA: Diagnosis not present

## 2022-03-23 DIAGNOSIS — M25611 Stiffness of right shoulder, not elsewhere classified: Secondary | ICD-10-CM | POA: Diagnosis not present

## 2022-03-30 DIAGNOSIS — M25511 Pain in right shoulder: Secondary | ICD-10-CM | POA: Diagnosis not present

## 2022-03-30 DIAGNOSIS — M7741 Metatarsalgia, right foot: Secondary | ICD-10-CM | POA: Diagnosis not present

## 2022-03-30 DIAGNOSIS — M2041 Other hammer toe(s) (acquired), right foot: Secondary | ICD-10-CM | POA: Diagnosis not present

## 2022-03-30 DIAGNOSIS — M25611 Stiffness of right shoulder, not elsewhere classified: Secondary | ICD-10-CM | POA: Diagnosis not present

## 2022-03-31 ENCOUNTER — Other Ambulatory Visit (HOSPITAL_COMMUNITY): Payer: Self-pay | Admitting: Orthopedic Surgery

## 2022-04-06 DIAGNOSIS — M25511 Pain in right shoulder: Secondary | ICD-10-CM | POA: Diagnosis not present

## 2022-04-06 DIAGNOSIS — M25611 Stiffness of right shoulder, not elsewhere classified: Secondary | ICD-10-CM | POA: Diagnosis not present

## 2022-04-11 DIAGNOSIS — M25611 Stiffness of right shoulder, not elsewhere classified: Secondary | ICD-10-CM | POA: Diagnosis not present

## 2022-04-11 DIAGNOSIS — M25511 Pain in right shoulder: Secondary | ICD-10-CM | POA: Diagnosis not present

## 2022-04-13 DIAGNOSIS — M25511 Pain in right shoulder: Secondary | ICD-10-CM | POA: Diagnosis not present

## 2022-04-13 DIAGNOSIS — M25611 Stiffness of right shoulder, not elsewhere classified: Secondary | ICD-10-CM | POA: Diagnosis not present

## 2022-04-18 DIAGNOSIS — M25611 Stiffness of right shoulder, not elsewhere classified: Secondary | ICD-10-CM | POA: Diagnosis not present

## 2022-04-18 DIAGNOSIS — M25511 Pain in right shoulder: Secondary | ICD-10-CM | POA: Diagnosis not present

## 2022-04-20 ENCOUNTER — Ambulatory Visit (INDEPENDENT_AMBULATORY_CARE_PROVIDER_SITE_OTHER): Payer: Medicare HMO | Admitting: Family Medicine

## 2022-04-20 ENCOUNTER — Encounter: Payer: Self-pay | Admitting: Family Medicine

## 2022-04-20 VITALS — BP 116/80 | HR 101 | Temp 98.8°F | Resp 16 | Ht 61.0 in | Wt 187.5 lb

## 2022-04-20 DIAGNOSIS — R059 Cough, unspecified: Secondary | ICD-10-CM

## 2022-04-20 DIAGNOSIS — U071 COVID-19: Secondary | ICD-10-CM | POA: Diagnosis not present

## 2022-04-20 DIAGNOSIS — M25611 Stiffness of right shoulder, not elsewhere classified: Secondary | ICD-10-CM | POA: Diagnosis not present

## 2022-04-20 DIAGNOSIS — M25511 Pain in right shoulder: Secondary | ICD-10-CM | POA: Diagnosis not present

## 2022-04-20 LAB — POCT INFLUENZA A/B
Influenza A, POC: NEGATIVE
Influenza B, POC: NEGATIVE

## 2022-04-20 LAB — POC COVID19 BINAXNOW: SARS Coronavirus 2 Ag: POSITIVE — AB

## 2022-04-20 MED ORDER — NIRMATRELVIR/RITONAVIR (PAXLOVID)TABLET: 3.0000 | ORAL_TABLET | Freq: Two times a day (BID) | ORAL | 0 refills | Status: AC

## 2022-04-20 NOTE — Progress Notes (Signed)
   Subjective:    Patient ID: Andrea Byrd, female    DOB: 26-Oct-1945, 77 y.o.   MRN: [id number]  HPI Cough- sxs started Tuesday night w/ cough.  No fever.  + body aches.  + HA.  + sinus pressure.  No SOB.  Cough is productive of light yellow sputum.  No N/V/D.   Review of Systems For ROS see HPI     Objective:   Physical Exam Vitals reviewed.  Constitutional:      General: She is not in acute distress.    Appearance: Normal appearance. She is well-developed.     Comments: Obviously not feeling well  HENT:     Head: Normocephalic and atraumatic.     Right Ear: Tympanic membrane normal.     Left Ear: Tympanic membrane normal.     Nose: Mucosal edema and congestion present. No rhinorrhea.     Right Sinus: No maxillary sinus tenderness or frontal sinus tenderness.     Left Sinus: No maxillary sinus tenderness or frontal sinus tenderness.     Mouth/Throat:     Pharynx: Uvula midline. Posterior oropharyngeal erythema present. No oropharyngeal exudate.  Eyes:     Conjunctiva/sclera: Conjunctivae normal.     Pupils: Pupils are equal, round, and reactive to light.  Cardiovascular:     Rate and Rhythm: Normal rate and regular rhythm.     Heart sounds: Normal heart sounds.  Pulmonary:     Effort: Pulmonary effort is normal. No respiratory distress.     Breath sounds: Normal breath sounds. No wheezing.  Musculoskeletal:     Cervical back: Normal range of motion and neck supple.  Lymphadenopathy:     Cervical: No cervical adenopathy.  Skin:    General: Skin is warm.  Neurological:     Mental Status: She is alert.           Assessment & Plan:  COVID- pt's sxs consistent w/ COVID and rapid test confirms.  Start Paxlovid as we are well within the treatment window.  Reviewed supportive care and red flags that should prompt return.  Pt expressed understanding and is in agreement w/ plan.

## 2022-04-20 NOTE — Patient Instructions (Signed)
Follow up as needed or as scheduled START the Paxlovid as directed Make sure you are drinking LOTS of fluids REST!!! Eat as you are able Cough syrup as needed Tylenol or ibuprofen for body aches/headache Call with any questions or concerns Hang in there!!!

## 2022-04-25 DIAGNOSIS — M25611 Stiffness of right shoulder, not elsewhere classified: Secondary | ICD-10-CM | POA: Diagnosis not present

## 2022-04-25 DIAGNOSIS — M25511 Pain in right shoulder: Secondary | ICD-10-CM | POA: Diagnosis not present

## 2022-04-27 DIAGNOSIS — M25611 Stiffness of right shoulder, not elsewhere classified: Secondary | ICD-10-CM | POA: Diagnosis not present

## 2022-04-27 DIAGNOSIS — M25511 Pain in right shoulder: Secondary | ICD-10-CM | POA: Diagnosis not present

## 2022-05-02 DIAGNOSIS — M25511 Pain in right shoulder: Secondary | ICD-10-CM | POA: Diagnosis not present

## 2022-05-02 DIAGNOSIS — M25611 Stiffness of right shoulder, not elsewhere classified: Secondary | ICD-10-CM | POA: Diagnosis not present

## 2022-05-03 NOTE — Progress Notes (Unsigned)
PATIENT: Andrea Byrd DOB: 20-Jan-1946  REASON FOR VISIT: follow up HISTORY FROM: patient  No chief complaint on file.    HISTORY OF PRESENT ILLNESS:  05/03/22 ALL: Andrea Byrd returns for follow up for neuropathy and RLS. She was last seen 10/2021. We resent rx for neuropathy cream and continued pramipexole. Duloxetine???  10/26/2021 ALL: Andrea Byrd returns for follow up. She was last seen 04/2021 and neuropathy was worsening. We continued pramipexole '1mg'$  BID and started a compounded neuropathy cream. She has continued pramipexole but reports accidentally cancelling rx for neuropathy cream. She had mistaken the pharmacist for her local pharmacist about another medication. She lost her husband in March. She reports neuropathy continues to worsen. She is having numbness/tingling/cold sensation of both feet. Occasional shooting pains. She does feel gait is off at times. She is scheduled for right bunion correction, 2nd hammer toe, 2nd metatarsal weil and tendon lengthening in two days. She feels this may be contributing to imbalance. She anticipates PT following surgery.   05/02/2021 ALL: Andrea Byrd returns for follow up for RLS and dysesthesias. She was last seen 10/2019. She continues pramipexole '1mg'$  BID. Gabapentin was discontinued by PCP about 2-3 months ago. She did not feel it was helping. Now on duloxetine '20mg'$  daily. 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 She continues to have numbness/tingling and describes a cold sensation of both feet. No changes in gait. She did have a fall a couple of months ago after her sock got stuck on glue that was dropped on the floor. She reports not being able to feel the hot glue through her sock. She continues to provide total care for her husband who is now bed bound. He is now being seen by hospice. She has an aide that helps for a few hours three times a week.   10/08/2019 ALL:  Andrea Byrd is a 77 y.o. female here today for  follow up for RLS and neuropathic pain of both feet. She continues gabapentin '600mg'$  BID and pramipexole '1mg'$  BID. She reports symptoms are about the same. She does have sharp shooting pains of both feel regularly. She does not wish to change treatment plan at this time. She is selling her home and building a smaller home. She continues to provide care to her disabled husband. Her sister passed away last month. She is seen regularly by PCP who currently refills all medications.   HISTORY: (copied from my note on 10/07/2018)  Andrea Byrd is a 77 y.o. female here today for follow up for restless legs. She continues pramipexole '1mg'$  BID and gabapentin '600mg'$  BID. She does continue to have aching in bilateral legs at night. She feels that this is manageable and does not wish to increase/add medications. 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 She is tolerating medications well with no adverse effects noted.    HISTORY: (copied from Dr Gladstone Lighter note on 10/02/2017)   UPDATE (10/02/17, VRP): Since last visit, doing well. Symptoms are mild. Severity is mild. No alleviating or aggravating factors. Tolerating meds. (on side note, pt husband is in hospital with stroke, and planning to be d/c'd today).    UPDATE 08/29/16: Since last visit, RLS stable on gabapentin and pramipexole. Sharp pains in toes and feet slightly worse.    UPDATE 09/01/15: Since last visit, doing well. On gabapentin '600mg'$  BID + pramipexole '1mg'$  BID. Sxs well  controlled. No side effects.    UPDATE 08/25/14: Since last visit, doing well. Has had chiro tx for back issues and feels better. On gabapentin '300mg'$  TID + pramipex '1mg'$  qhs. Satisfied with current dosing.    UPDATE 04/17/14: Since last visit, symptoms have continued. Numbness in feet, restless legs at night. On gabapentin '300mg'$  TID. PCP also tried her on pramipexole 0.'25mg'$ x3 at bedtime.    UPDATE 08/13/12: 77 year old right-handed female with  history of hypertension, restless leg syndrome, here for evaluation of progressive numbness and tingling in her toes and feet. I previously saw this patient in October 2013 as referred by primary care physician. Now patient is referred to me by orthopedic surgery. Since last visit patient's symptoms have progressed. Now she has numbness and tingling in her bilateral feet up to her ankles. She has intermittent sharp sensations as well. Symptoms are fairly symmetric. No symptoms in her upper legs, lower back, torso, arms or neck.   PRIOR HPI (12/18/11): 77 year old right-handed female with history of hypertension, this is a syndrome, here for evaluation of numbness in her feet for the past 3 months. Patient describes numbness and tingling in her toes and bottom of her feet. No significant pain. She also feels coldness in her feet. No back pain or symptoms radiating from her back to her feet. No symptoms in her hands or fingers. No neck pain. Symptoms are mild and gradually progressing. 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 She did not have any numbness in the symptoms at that time.     REVIEW OF SYSTEMS: Out of a complete 14 system review of symptoms, the patient complains only of the following symptoms, restless legs, dysesthesias and all other reviewed systems are negative.   ALLERGIES: Allergies  Allergen Reactions   Nitrofurantoin     Makes mouth break out/blisters   Propoxyphene N-Acetaminophen     REACTION: nausea   Nexium [Esomeprazole Magnesium] Nausea And Vomiting   Nortriptyline Hcl Nausea And Vomiting   Hydrocodone Bit-Homatrop Mbr Itching and Rash   Morphine Itching and Rash    HOME MEDICATIONS: Outpatient Medications Prior to Visit  Medication Sig Dispense Refill   dicyclomine (BENTYL) 10 MG capsule Take 1 capsule (10 mg total) by mouth every 4 (four) hours as needed for spasms.  30 capsule 5   FLUoxetine (PROZAC) 20 MG capsule TAKE 1 CAPSULE BY MOUTH EVERY DAY 90 capsule 2   furosemide (LASIX) 20 MG tablet TAKE 1 TABLET BY MOUTH EVERY DAY 90 tablet 1   latanoprost (XALATAN) 0.005 % ophthalmic solution Place 1 drop into both eyes at bedtime.      losartan-hydrochlorothiazide (HYZAAR) 50-12.5 MG tablet TAKE 1 TABLET BY MOUTH EVERY DAY 90 tablet 1   omeprazole (PRILOSEC) 20 MG capsule Take 1 capsule (20 mg total) by mouth 2 (two) times daily. 30-60 minutes before breakfast and dinner. 60 capsule 5   potassium chloride SA (KLOR-CON M) 20 MEQ tablet Take 1 tablet (20 mEq total) by mouth daily. 90 tablet 1   pramipexole (MIRAPEX) 1 MG tablet TAKE 1 TABLET BY MOUTH TWICE A DAY 180 tablet 1   tiZANidine (ZANAFLEX) 4 MG tablet TAKE 1 TABLET BY MOUTH THREE TIMES A DAY AS NEEDED FOR MUSCLE SPASM 270 tablet 1   Vitamin D, Ergocalciferol, (DRISDOL) 1.25 MG (50000 UNIT) CAPS capsule Take 1 capsule (50,000 Units total) by mouth every 7 (seven) days. 12 capsule 0  Vitamin D, Ergocalciferol, (DRISDOL) 1.25 MG (50000 UNIT) CAPS capsule Take 1 capsule (50,000 Units total) by mouth every 7 (seven) days. 7 capsule 12   No facility-administered medications prior to visit.    PAST MEDICAL HISTORY: Past Medical History:  Diagnosis Date   Anemia    Arthritis    Cervical cancer ([place]) 1977   Diverticulosis    Endometriosis    Esophageal stricture    Gastritis    GERD (gastroesophageal reflux disease)    Glaucoma    Herpes simplex type 1 infection    Hiatal hernia    History of colon polyps    History of gallstones    Hypertension    IBS (irritable bowel syndrome)    Insomnia    Post-operative nausea and vomiting    Restless leg syndrome     PAST SURGICAL HISTORY: Past Surgical History:  Procedure Laterality Date   ABDOMINAL HYSTERECTOMY  1998   APPENDECTOMY  1998   BUNIONECTOMY WITH HAMMERTOE RECONSTRUCTION Right 11/03/2021   Procedure: Right bunion correction with double  osteotomy; Second Metatarsal weil; Second hammertoe correction; third through fifth tendon lengthening; excision of bunionette;  Surgeon: Wylene Simmer, MD;  Location: [facility];  Service: Orthopedics;  Laterality: Right;   CERVICAL CONE BIOPSY     CHOLECYSTECTOMY  1980   COLON RESECTION  2002   COLON SURGERY  2003   removed 2 feet colon   ESOPHAGEAL MANOMETRY N/A 04/24/2016   Procedure: ESOPHAGEAL MANOMETRY (EM);  Surgeon: Mauri Pole, MD;  Location: [facility];  Service: Endoscopy;  Laterality: N/A;   HAND SURGERY     right/ thumb joints replaced both hands   OOPHORECTOMY     TUBAL LIGATION      FAMILY HISTORY: Family History  Problem Relation Age of Onset   Diabetes Mother    Stroke Mother        brother, sister, MGM   Colon polyps Mother    Heart disease Father    Kidney disease Father    Colon polyps Father    Lung disease Father    Kidney cancer Sister    Stroke Sister    Diabetes Sister    Colon polyps Sister    Celiac disease Other        niece   Heart attack Paternal Grandfather    Colon cancer Neg Hx    Esophageal cancer Neg Hx    Rectal cancer Neg Hx    Stomach cancer Neg Hx     SOCIAL HISTORY: Social History   Socioeconomic History   Marital status: Widowed    Spouse name: Not on file   Number of children: 1   Years of education: Not on file   Highest education level: Not on file  Occupational History   Occupation: Retired  Tobacco Use   Smoking status: Never   Smokeless tobacco: Never  Vaping Use   Vaping Use: Never used  Substance and Sexual Activity   Alcohol use: No   Drug use: No   Sexual activity: Not on file  Other Topics Concern   Not on file  Social History Narrative   Lives with husband floyd   Drinks maybe 4 sodas a week   Social Determinants of Health   Financial Resource Strain: Low Risk  (05/12/2021)   Overall Financial Resource Strain (CARDIA)    Difficulty of Paying Living Expenses: Not hard at all   Food Insecurity: No Food Insecurity (05/12/2021)   Hunger Vital Sign  Worried About Charity fundraiser in the Last Year: Never true    [place] in the Last Year: Never true  Transportation Needs: No Transportation Needs (05/12/2021)   PRAPARE - Hydrologist (Medical): No    Lack of Transportation (Non-Medical): No  Physical Activity: Insufficiently Active (05/12/2021)   Exercise Vital Sign    Days of Exercise per Week: 2 days    Minutes of Exercise per Session: 30 min  Stress: No Stress Concern Present (05/12/2021)   [place]    Feeling of Stress : Not at all  Social Connections: Moderately Integrated (05/12/2021)   Social Connection and Isolation Panel [NHANES]    Frequency of Communication with Friends and Family: Twice a week    Frequency of Social Gatherings with Friends and Family: Twice a week    Attends Religious Services: 1 to 4 times per year    Active Member of [employer] or Organizations: No    Attends Archivist Meetings: Never    Marital Status: Married  Human resources officer Violence: Not At Risk (05/12/2021)   Humiliation, Afraid, Rape, and Kick questionnaire    Fear of Current or Ex-Partner: No    Emotionally Abused: No    Physically Abused: No    Sexually Abused: No      PHYSICAL EXAM  There were no vitals filed for this visit.    There is no height or weight on file to calculate BMI.  Generalized: Well developed, in no acute distress  Cardiology: normal rate and rhythm, no murmur noted Respiratory: clear to auscultation bilaterally  Neurological examination  Mentation: Alert oriented to time, place, history taking. Follows all commands speech and language fluent Cranial nerve II-XII: Pupils were equal round reactive to light. Extraocular movements were full, visual field were full on confrontational test. Facial sensation and strength were normal. Uvula  tongue midline. Head turning and shoulder shrug  were normal and symmetric. Motor: The motor testing reveals 5 over 5 strength of all 4 extremities. Good symmetric motor tone is noted throughout.  Sensory: Sensory testing is intact to soft touch on all 4 extremities with exception of decreased sensation to all toes bilaterally. No evidence of extinction is noted.  Coordination: Cerebellar testing reveals good finger-nose-finger and heel-to-shin bilaterally.  Gait and station: Gait is normal.    DIAGNOSTIC DATA (LABS, IMAGING, TESTING) - I reviewed patient records, labs, notes, testing and imaging myself where available.     09/26/2017    1:26 PM  MMSE - Mini Mental State Exam  Orientation to time 5  Orientation to Place 5  Registration 3  Attention/ Calculation 5  Recall 3  Language- name 2 objects 2  Language- repeat 1  Language- follow 3 step command 3  Language- read & follow direction 1  Write a sentence 1  Copy design 1  Total score 30     Lab Results  Component Value Date   WBC 5.3 02/22/2022   HGB 12.9 02/22/2022   HCT 38.5 02/22/2022   MCV 86.9 02/22/2022   PLT 270.0 02/22/2022      Component Value Date/Time   NA 140 02/22/2022 0827   NA 141 10/11/2015 0000   K 3.6 02/22/2022 0827   CL 100 02/22/2022 0827   CO2 33 (H) 02/22/2022 0827   GLUCOSE 93 02/22/2022 0827   BUN 12 02/22/2022 0827   BUN 13 10/11/2015 0000   CREATININE  0.86 02/22/2022 0827   CREATININE 0.88 08/06/2017 1651   CALCIUM 9.9 02/22/2022 0827   PROT 6.7 02/22/2022 0827   ALBUMIN 4.2 02/22/2022 0827   AST 19 02/22/2022 0827   ALT 13 02/22/2022 0827   ALKPHOS 109 02/22/2022 0827   BILITOT 0.5 02/22/2022 0827   GFRNONAA >60 10/31/2021 1257   GFRAA >60 10/05/2017 1535   Lab Results  Component Value Date   CHOL 174 02/22/2022   HDL 50.40 02/22/2022   LDLCALC 99 02/22/2022   TRIG 122.0 02/22/2022   CHOLHDL 3 02/22/2022   Lab Results  Component Value Date   HGBA1C 5.6 08/19/2021    No results found for: "VITAMINB12" Lab Results  Component Value Date   TSH 1.46 02/22/2022      ASSESSMENT AND PLAN 77 y.o. year old female  has a past medical history of Anemia, Arthritis, Cervical cancer ([place]) (1977), Diverticulosis, Endometriosis, Esophageal stricture, Gastritis, GERD (gastroesophageal reflux disease), Glaucoma, Herpes simplex type 1 infection, Hiatal hernia, History of colon polyps, History of gallstones, Hypertension, IBS (irritable bowel syndrome), Insomnia, Post-operative nausea and vomiting, and Restless leg syndrome. here with   No diagnosis found.     Terrina reports that RLS is stable, however, neuropathy has worsened. Gabpentin was not helpful. Duloxetine is well tolerated but minimally effective. She is very hesitant to increase dose and does not wish to add any medicaitons that could cause sedative side effects. She is most comfortable with topical options. We have discussed effectiveness of capsaicin and lidocaine for temporary relief. Alpha lipoic acid may be beneficial. She reports accidentally cancelling previous rx for compounded cream. I will resend rx for compounded neuropathy cream covered for her to try. She is aware rx is being sent to [facility]. She was encouraged to continue focusing on healthy lifestyle habits. She will follow up with me in 6 months. May follow up as needed if OTC treatments work.  She verbalizes understanding and agreement with this plan.    No orders of the defined types were placed in this encounter.    No orders of the defined types were placed in this encounter.     Debbora Presto, FNP-C 05/03/2022, 5:11 PM [facility] [address], [place], [place] [address] [phone]

## 2022-05-03 NOTE — Patient Instructions (Signed)
Below is our plan:  We will continue pramipexole IR 1mg  tablet taking 1-2 at bedtime. We will an extended release dose of pramipexole 1.5mg  daily in the morning. If you feel this would be better tolerated int he afternoons you can switch times.   Please make sure you are staying well hydrated. I recommend 50-60 ounces daily. Well balanced diet and regular exercise encouraged. Consistent sleep schedule with 6-8 hours recommended.   Please continue follow up with care team as directed.   Follow up with me in 6 months   You may receive a survey regarding today's visit. I encourage you to leave honest feed back as I do use this information to improve patient care. Thank you for seeing me today!

## 2022-05-04 ENCOUNTER — Encounter: Payer: Self-pay | Admitting: Family Medicine

## 2022-05-04 ENCOUNTER — Ambulatory Visit: Payer: Medicare HMO | Admitting: Family Medicine

## 2022-05-04 VITALS — BP 127/79 | HR 90 | Ht 61.0 in | Wt 189.0 lb

## 2022-05-04 DIAGNOSIS — R208 Other disturbances of skin sensation: Secondary | ICD-10-CM

## 2022-05-04 DIAGNOSIS — G2581 Restless legs syndrome: Secondary | ICD-10-CM

## 2022-05-04 DIAGNOSIS — M25611 Stiffness of right shoulder, not elsewhere classified: Secondary | ICD-10-CM | POA: Diagnosis not present

## 2022-05-04 DIAGNOSIS — M25511 Pain in right shoulder: Secondary | ICD-10-CM | POA: Diagnosis not present

## 2022-05-04 MED ORDER — PRAMIPEXOLE DIHYDROCHLORIDE ER 1.5 MG PO TB24: 1.5000 mg | ORAL_TABLET | Freq: Every day | ORAL | 1 refills | Status: DC

## 2022-05-05 ENCOUNTER — Encounter (HOSPITAL_COMMUNITY): Payer: Self-pay | Admitting: Anesthesiology

## 2022-05-05 DIAGNOSIS — M25511 Pain in right shoulder: Secondary | ICD-10-CM | POA: Diagnosis not present

## 2022-05-05 NOTE — Progress Notes (Signed)
Patient needs to be re-scheduled d/t Covid dx 04/20/22. Patient states asymptomatic since 04/27/22. Verified that patient needs to be re-scheduled with Dr. Roanna Banning, anesthesiologist. Marney Setting, at Dr. Nona Dell office that patient needs to be re-scheduled at a date after May 17 2021.

## 2022-05-08 ENCOUNTER — Other Ambulatory Visit (HOSPITAL_COMMUNITY): Payer: Self-pay | Admitting: Orthopedic Surgery

## 2022-05-08 ENCOUNTER — Ambulatory Visit
Admission: RE | Admit: 2022-05-08 | Discharge: 2022-05-08 | Disposition: A | Discharge status: Home / Self Care | Payer: Medicare HMO | Source: Ambulatory Visit | Attending: Family Medicine | Admitting: Family Medicine

## 2022-05-08 DIAGNOSIS — Z1231 Encounter for screening mammogram for malignant neoplasm of breast: Secondary | ICD-10-CM

## 2022-05-09 ENCOUNTER — Encounter: Payer: Self-pay | Admitting: Gastroenterology

## 2022-05-09 ENCOUNTER — Ambulatory Visit: Payer: Medicare HMO | Admitting: Gastroenterology

## 2022-05-09 VITALS — BP 132/90 | HR 85 | Ht 61.0 in | Wt 189.8 lb

## 2022-05-09 DIAGNOSIS — K219 Gastro-esophageal reflux disease without esophagitis: Secondary | ICD-10-CM

## 2022-05-09 NOTE — Patient Instructions (Signed)
We have sent the following medications to your pharmacy for you to pick up at your convenience: Omeprazole 40 mg twice daily 30-60 minutes before breakfast and dinner.    Referral placed to Healthy Weight [facility] Wellness. _______________________________________________________  If your blood pressure at your visit was 140/90 or greater, please contact your primary care physician to follow up on this.  _______________________________________________________  If you are age 62 or older, your body mass index should be between 23-30. Your Body mass index is 35.86 kg/m. If this is out of the aforementioned range listed, please consider follow up with your Primary Care Provider.  If you are age 85 or younger, your body mass index should be between 19-25. Your Body mass index is 35.86 kg/m. If this is out of the aformentioned range listed, please consider follow up with your Primary Care Provider.   ________________________________________________________  The [facility] providers would like to encourage you to use [facility] to communicate with providers for non-urgent requests or questions.  Due to long hold times on the telephone, sending your provider a message by [facility] may be a faster and more efficient way to get a response.  Please allow 48 business hours for a response.  Please remember that this is for non-urgent requests.  _______________________________________________________

## 2022-05-09 NOTE — Progress Notes (Signed)
Referring Provider: [name], MD Primary Care Physician:  [name], MD  Chief Complaint:  Dysphagia   IMPRESSION:  Reflux esophagitis with hiatal hernia on recent EGD Solid food dysphagia not improved by empiric dilation 12/23    - ? GERD-related dysmotility versus primary esophageal dysmotility    - Esophageal manometry in 2018 suggested hypercontractile esophagus    - She did not previously tolerate nortriptyline    - Not tried on calcium channel blockers due to low baseline blood pressure Chronic constipation    - Controlling symptoms with OTC meds    - not interested in considering prescription therapy Colon polyps    - two small tubular adenoma and one inflammatory polyp on colonoscopy 2023    - no surveillance recommended due to age BMI 35.86    PLAN: - Increase omeprazole 40 mg BID - Reflux lifestyle modifications reviewed (brochure provided) - Referral to [facility] - Barium esophagram if not improving (declined to schedule today) - Follow-up with Anderson Malta in 3-4 months, earlier if necessary   HPI: Andrea Byrd is a 77 y.o. female who returns in follow-up after endoscopic evaluation.  This is my first office visit with Andrea Byrd.  She has previously been followed by Dr. Roland Rack.  I did meet her recently at the time of her EGD and colonoscopy.    02/09/2012 colonoscopy with diverticulosis in sigmoid anastomosis, unable to retroflex in the rectum due to small rectal vault, repeat recommended 10 years.    08/21/2016 EGD with LA grade C esophagitis, esophageal stricture dilated with 20 mm TTS balloon and gastritis.    08/25/2019 office visit with Dr. Silverio Decamp.  At that time was there for follow-up of GERD and right lower quadrant abdominal pain.  Continued with some persistent lower abdominal pain.  Discussed CT with moderate hiatal hernia, sigmoid diverticulosis and multilevel degenerative changes in the lumbar spine.   01/19/22 office  visit with Ellouise Newer, PA for solid food dysphagia without heartburn or reflux symptoms.    02/16/22: EGD with empiric balloon dilation to 40m with minimal resistance to a fully inflated balloon, medium-sized hiatal hernia. Esophageal biopsies showed reflux esophagitis.    02/16/22: Colonoscopy showed pancolonic diverticulosis, a 519mtransverse inflammatory polyp, and 2 small right sided tubular adenomas. Surveillance colonoscopy not recommended due to advanced age.   She returns in follow-up after endoscopic evaluation. Maybe some improvement in dysphagia following dilation but not enough that she wants to ever have another in the future.   She also has a lifetimes history of constipation. She will use Miralax and a stool softener every day to have a bowel movement daily. No sense of incomplete evacuation or manual assistance with defecation. No straining. She acknowledges not drinking enough water.  She declines further medications at this time.   Normal TSH 1.46, normal CBC, normal CMP including calcium 12/23   Past Medical History:  Diagnosis Date   Anemia    Arthritis    Cervical cancer ([place]   Diverticulosis    Endometriosis    Esophageal stricture    Gastritis    GERD (gastroesophageal reflux disease)    Glaucoma    Herpes simplex type 1 infection    Hiatal hernia    History of colon polyps    History of gallstones    Hypertension    IBS (irritable bowel syndrome)    Insomnia    Post-operative nausea and vomiting    Restless leg syndrome  Past Surgical History:  Procedure Laterality Date   ABDOMINAL HYSTERECTOMY  1998   APPENDECTOMY  1998   BUNIONECTOMY WITH HAMMERTOE RECONSTRUCTION Right 11/03/2021   Procedure: Right bunion correction with double osteotomy; Second Metatarsal weil; Second hammertoe correction; third through fifth tendon lengthening; excision of bunionette;  Surgeon: Wylene Simmer, MD;  Location: [facility];  Service:  Orthopedics;  Laterality: Right;   CERVICAL CONE BIOPSY     CHOLECYSTECTOMY  1980   COLON RESECTION  2002   COLON SURGERY  2003   removed 2 feet colon   ESOPHAGEAL MANOMETRY N/A 04/24/2016   Procedure: ESOPHAGEAL MANOMETRY (EM);  Surgeon: Mauri Pole, MD;  Location: [facility];  Service: Endoscopy;  Laterality: N/A;   HAND SURGERY     right/ thumb joints replaced both hands   OOPHORECTOMY     TUBAL LIGATION      Current Outpatient Medications  Medication Sig Dispense Refill   dicyclomine (BENTYL) 10 MG capsule Take 1 capsule (10 mg total) by mouth every 4 (four) hours as needed for spasms. 30 capsule 5   FLUoxetine (PROZAC) 20 MG capsule TAKE 1 CAPSULE BY MOUTH EVERY DAY 90 capsule 2   furosemide (LASIX) 20 MG tablet TAKE 1 TABLET BY MOUTH EVERY DAY 90 tablet 1   latanoprost (XALATAN) 0.005 % ophthalmic solution Place 1 drop into both eyes at bedtime.      losartan-hydrochlorothiazide (HYZAAR) 50-12.5 MG tablet TAKE 1 TABLET BY MOUTH EVERY DAY 90 tablet 1   omeprazole (PRILOSEC) 20 MG capsule Take 1 capsule (20 mg total) by mouth 2 (two) times daily. 30-60 minutes before breakfast and dinner. 60 capsule 5   potassium chloride SA (KLOR-CON M) 20 MEQ tablet Take 1 tablet (20 mEq total) by mouth daily. 90 tablet 1   pramipexole (MIRAPEX) 1 MG tablet TAKE 1 TABLET BY MOUTH TWICE A DAY 180 tablet 1   Pramipexole Dihydrochloride 1.5 MG TB24 Take 1 tablet (1.5 mg total) by mouth daily. 90 tablet 1   tiZANidine (ZANAFLEX) 4 MG tablet TAKE 1 TABLET BY MOUTH THREE TIMES A DAY AS NEEDED FOR MUSCLE SPASM 270 tablet 1   Vitamin D, Ergocalciferol, (DRISDOL) 1.25 MG (50000 UNIT) CAPS capsule Take 1 capsule (50,000 Units total) by mouth every 7 (seven) days. 7 capsule 12   No current facility-administered medications for this visit.    Allergies as of 05/09/2022 - Review Complete 05/04/2022  Allergen Reaction Noted   Nitrofurantoin  02/04/2009   Propoxyphene n-acetaminophen  02/04/2009    Nexium [esomeprazole magnesium] Nausea And Vomiting 08/29/2016   Nortriptyline hcl Nausea And Vomiting 08/29/2016   Hydrocodone bit-homatrop mbr Itching and Rash 09/01/2015   Morphine Itching and Rash     Family History  Problem Relation Age of Onset   Diabetes Mother    Stroke Mother        brother, sister, MGM   Colon polyps Mother    Heart disease Father    Kidney disease Father    Colon polyps Father    Lung disease Father    Kidney cancer Sister    Stroke Sister    Diabetes Sister    Colon polyps Sister    Celiac disease Other        niece   Heart attack Paternal Grandfather    Colon cancer Neg Hx    Esophageal cancer Neg Hx    Rectal cancer Neg Hx    Stomach cancer Neg Hx       Physical  Exam: General:   Alert,  well-nourished, pleasant and cooperative in NAD Head:  Normocephalic and atraumatic. Eyes:  Sclera clear, no icterus.   Conjunctiva pink. Abdomen:  Soft, nontender, nondistended, normal bowel sounds, no rebound or guarding. No hepatosplenomegaly.   Neurologic:  Alert and  oriented x4;  grossly nonfocal Skin:  Intact without significant lesions or rashes. Psych:  Alert and cooperative. Normal mood and affect.   Ivori Storr L. Tarri Glenn, MD, MPH 05/09/2022, 2:07 PM

## 2022-05-18 ENCOUNTER — Telehealth: Payer: Self-pay | Admitting: Family Medicine

## 2022-05-18 MED ORDER — PRAMIPEXOLE DIHYDROCHLORIDE 1 MG PO TABS: 1.0000 mg | ORAL_TABLET | Freq: Three times a day (TID) | ORAL | 1 refills | Status: DC

## 2022-05-18 NOTE — Addendum Note (Signed)
Addended by: Debbora Presto L on: 05/18/2022 04:27 PM   Modules accepted: Orders

## 2022-05-18 NOTE — Telephone Encounter (Signed)
Called [facility] at [phone]. Spoke w/ pharmacist. States no PA needed. Cost to pt for 30 tabs: 181.59.  Checked goodrx and the cheapest option for 30 days supply if at [employer] (84.07) or [facility] (80.91).  [facility] is still 130.70 for 30 days supply.

## 2022-05-18 NOTE — Telephone Encounter (Signed)
Pt called and stated medication Pramipexole Dihydrochloride 1.5 MG TB24 isn't covered on her drug formulary. Pt is requesting [name] reach out to her insurance.

## 2022-05-18 NOTE — Addendum Note (Signed)
Addended by: Thamas Jaegers on: 05/18/2022 02:43 PM   Modules accepted: Orders

## 2022-05-18 NOTE — Telephone Encounter (Signed)
[name] replied "The alternative would be to increase the dose of her pramipexole IR. She is taking 1-'2mg'$  per day. I could increase to 1 tablet three times daily. "   Patient informed. States she can't afford Pramipexole Dihydrochloride 1.5 MG TB24

## 2022-05-19 DIAGNOSIS — M659 Synovitis and tenosynovitis, unspecified: Secondary | ICD-10-CM | POA: Diagnosis not present

## 2022-05-19 DIAGNOSIS — M94211 Chondromalacia, right shoulder: Secondary | ICD-10-CM | POA: Diagnosis not present

## 2022-05-19 DIAGNOSIS — X58XXXA Exposure to other specified factors, initial encounter: Secondary | ICD-10-CM | POA: Diagnosis not present

## 2022-05-19 DIAGNOSIS — M65811 Other synovitis and tenosynovitis, right shoulder: Secondary | ICD-10-CM | POA: Diagnosis not present

## 2022-05-19 DIAGNOSIS — M7551 Bursitis of right shoulder: Secondary | ICD-10-CM | POA: Diagnosis not present

## 2022-05-19 DIAGNOSIS — S46011A Strain of muscle(s) and tendon(s) of the rotator cuff of right shoulder, initial encounter: Secondary | ICD-10-CM | POA: Diagnosis not present

## 2022-05-19 DIAGNOSIS — M75121 Complete rotator cuff tear or rupture of right shoulder, not specified as traumatic: Secondary | ICD-10-CM | POA: Diagnosis not present

## 2022-05-19 DIAGNOSIS — M948X1 Other specified disorders of cartilage, shoulder: Secondary | ICD-10-CM | POA: Diagnosis not present

## 2022-05-19 DIAGNOSIS — Y999 Unspecified external cause status: Secondary | ICD-10-CM | POA: Diagnosis not present

## 2022-05-19 DIAGNOSIS — G8918 Other acute postprocedural pain: Secondary | ICD-10-CM | POA: Diagnosis not present

## 2022-05-25 ENCOUNTER — Telehealth: Payer: Self-pay | Admitting: Family Medicine

## 2022-05-25 NOTE — Telephone Encounter (Signed)
Called patient to schedule Medicare Annual Wellness Visit (AWV). Left message for patient to call back and schedule Medicare Annual Wellness Visit (AWV).  Last date of AWV: 05/12/2021   Please schedule an appointment at any time with [facility] ANNUAL WELLNESS VISIT.  If any questions, please contact me at [phone].    Thank you,  [place] Direct dial  [phone]

## 2022-06-01 ENCOUNTER — Ambulatory Visit (HOSPITAL_BASED_OUTPATIENT_CLINIC_OR_DEPARTMENT_OTHER): Admit: 2022-06-01 | Payer: Medicare HMO | Admitting: Orthopedic Surgery

## 2022-06-01 ENCOUNTER — Encounter (HOSPITAL_BASED_OUTPATIENT_CLINIC_OR_DEPARTMENT_OTHER): Payer: Self-pay

## 2022-06-01 SURGERY — TENOTOMY, FLEXOR
Anesthesia: General | Laterality: Right

## 2022-06-03 DIAGNOSIS — M25511 Pain in right shoulder: Secondary | ICD-10-CM | POA: Diagnosis not present

## 2022-06-13 DIAGNOSIS — G8918 Other acute postprocedural pain: Secondary | ICD-10-CM | POA: Diagnosis not present

## 2022-06-13 DIAGNOSIS — M19011 Primary osteoarthritis, right shoulder: Secondary | ICD-10-CM | POA: Diagnosis not present

## 2022-06-13 DIAGNOSIS — M75121 Complete rotator cuff tear or rupture of right shoulder, not specified as traumatic: Secondary | ICD-10-CM | POA: Diagnosis not present

## 2022-06-19 HISTORY — PX: TOTAL SHOULDER REPLACEMENT: SUR1217

## 2022-06-22 ENCOUNTER — Telehealth: Payer: Self-pay | Admitting: Family Medicine

## 2022-06-22 NOTE — Telephone Encounter (Signed)
Contacted Andrea Byrd to schedule their annual wellness visit. Appointment made for 06/28/2022.  Thank you,  [facility] Direct dial  [phone]

## 2022-06-26 DIAGNOSIS — Z471 Aftercare following joint replacement surgery: Secondary | ICD-10-CM | POA: Diagnosis not present

## 2022-06-26 DIAGNOSIS — Z96611 Presence of right artificial shoulder joint: Secondary | ICD-10-CM | POA: Diagnosis not present

## 2022-06-27 ENCOUNTER — Other Ambulatory Visit: Payer: Self-pay | Admitting: Family Medicine

## 2022-06-28 ENCOUNTER — Ambulatory Visit (INDEPENDENT_AMBULATORY_CARE_PROVIDER_SITE_OTHER): Payer: Medicare HMO | Admitting: *Deleted

## 2022-06-28 DIAGNOSIS — Z0000 Encounter for general adult medical examination without abnormal findings: Secondary | ICD-10-CM

## 2022-06-28 NOTE — Patient Instructions (Signed)
Andrea Byrd , Thank you for taking time to come for your Medicare Wellness Visit. I appreciate your ongoing commitment to your health goals. Please review the following plan we discussed and let me know if I can assist you in the future.   Screening recommendations/referrals: Colonoscopy: up to date Mammogram: up to date Bone Density: up to date Recommended yearly ophthalmology/optometry visit for glaucoma screening and checkup Recommended yearly dental visit for hygiene and checkup  Vaccinations: Influenza vaccine: up to date Pneumococcal vaccine: up to date Tdap vaccine: Education provided Shingles vaccine: Education provided    Advanced directives: not on file       Preventive Care 76 Years and Older, Female Preventive care refers to lifestyle choices and visits with your health care provider that can promote health and wellness. What does preventive care include? A yearly physical exam. This is also called an annual well check. Dental exams once or twice a year. Routine eye exams. Ask your health care provider how often you should have your eyes checked. Personal lifestyle choices, including: Daily care of your teeth and gums. Regular physical activity. Eating a healthy diet. Avoiding tobacco and drug use. Limiting alcohol use. Practicing safe sex. Taking low-dose aspirin every day. Taking vitamin and mineral supplements as recommended by your health care provider. What happens during an annual well check? The services and screenings done by your health care provider during your annual well check will depend on your age, overall health, lifestyle risk factors, and family history of disease. Counseling  Your health care provider may ask you questions about your: Alcohol use. Tobacco use. Drug use. Emotional well-being. Home and relationship well-being. Sexual activity. Eating habits. History of falls. Memory and ability to understand (cognition). Work and work  Astronomer. Reproductive health. Screening  You may have the following tests or measurements: Height, weight, and BMI. Blood pressure. Lipid and cholesterol levels. These may be checked every 5 years, or more frequently if you are over 68 years old. Skin check. Lung cancer screening. You may have this screening every year starting at age 56 if you have a 30-pack-year history of smoking and currently smoke or have quit within the past 15 years. Fecal occult blood test (FOBT) of the stool. You may have this test every year starting at age 54. Flexible sigmoidoscopy or colonoscopy. You may have a sigmoidoscopy every 5 years or a colonoscopy every 10 years starting at age 70. Hepatitis C blood test. Hepatitis B blood test. Sexually transmitted disease (STD) testing. Diabetes screening. This is done by checking your blood sugar (glucose) after you have not eaten for a while (fasting). You may have this done every 1-3 years. Bone density scan. This is done to screen for osteoporosis. You may have this done starting at age 47. Mammogram. This may be done every 1-2 years. Talk to your health care provider about how often you should have regular mammograms. Talk with your health care provider about your test results, treatment options, and if necessary, the need for more tests. Vaccines  Your health care provider may recommend certain vaccines, such as: Influenza vaccine. This is recommended every year. Tetanus, diphtheria, and acellular pertussis (Tdap, Td) vaccine. You may need a Td booster every 10 years. Zoster vaccine. You may need this after age 65. Pneumococcal 13-valent conjugate (PCV13) vaccine. One dose is recommended after age 69. Pneumococcal polysaccharide (PPSV23) vaccine. One dose is recommended after age 11. Talk to your health care provider about which screenings and vaccines you need  and how often you need them. This information is not intended to replace advice given to you by  your health care provider. Make sure you discuss any questions you have with your health care provider. Document Released: 03/19/2015 Document Revised: 11/10/2015 Document Reviewed: 12/22/2014 Elsevier Interactive Patient Education  2017 [employer] Prevention in the Home Falls can cause injuries. They can happen to people of all ages. There are many things you can do to make your home safe and to help prevent falls. What can I do on the outside of my home? Regularly fix the edges of walkways and driveways and fix any cracks. Remove anything that might make you trip as you walk through a door, such as a raised step or threshold. Trim any bushes or trees on the path to your home. Use bright outdoor lighting. Clear any walking paths of anything that might make someone trip, such as rocks or tools. Regularly check to see if handrails are loose or broken. Make sure that both sides of any steps have handrails. Any raised decks and porches should have guardrails on the edges. Have any leaves, snow, or ice cleared regularly. Use sand or salt on walking paths during winter. Clean up any spills in your garage right away. This includes oil or grease spills. What can I do in the bathroom? Use night lights. Install grab bars by the toilet and in the tub and shower. Do not use towel bars as grab bars. Use non-skid mats or decals in the tub or shower. If you need to sit down in the shower, use a plastic, non-slip stool. Keep the floor dry. Clean up any water that spills on the floor as soon as it happens. Remove soap buildup in the tub or shower regularly. Attach bath mats securely with double-sided non-slip rug tape. Do not have throw rugs and other things on the floor that can make you trip. What can I do in the bedroom? Use night lights. Make sure that you have a light by your bed that is easy to reach. Do not use any sheets or blankets that are too big for your bed. They should not hang  down onto the floor. Have a firm chair that has side arms. You can use this for support while you get dressed. Do not have throw rugs and other things on the floor that can make you trip. What can I do in the kitchen? Clean up any spills right away. Avoid walking on wet floors. Keep items that you use a lot in easy-to-reach places. If you need to reach something above you, use a strong step stool that has a grab bar. Keep electrical cords out of the way. Do not use floor polish or wax that makes floors slippery. If you must use wax, use non-skid floor wax. Do not have throw rugs and other things on the floor that can make you trip. What can I do with my stairs? Do not leave any items on the stairs. Make sure that there are handrails on both sides of the stairs and use them. Fix handrails that are broken or loose. Make sure that handrails are as long as the stairways. Check any carpeting to make sure that it is firmly attached to the stairs. Fix any carpet that is loose or worn. Avoid having throw rugs at the top or bottom of the stairs. If you do have throw rugs, attach them to the floor with carpet tape. Make sure that you  have a light switch at the top of the stairs and the bottom of the stairs. If you do not have them, ask someone to add them for you. What else can I do to help prevent falls? Wear shoes that: Do not have high heels. Have rubber bottoms. Are comfortable and fit you well. Are closed at the toe. Do not wear sandals. If you use a stepladder: Make sure that it is fully opened. Do not climb a closed stepladder. Make sure that both sides of the stepladder are locked into place. Ask someone to hold it for you, if possible. Clearly mark and make sure that you can see: Any grab bars or handrails. First and last steps. Where the edge of each step is. Use tools that help you move around (mobility aids) if they are needed. These  include: Canes. Walkers. Scooters. Crutches. Turn on the lights when you go into a dark area. Replace any light bulbs as soon as they burn out. Set up your furniture so you have a clear path. Avoid moving your furniture around. If any of your floors are uneven, fix them. If there are any pets around you, be aware of where they are. Review your medicines with your doctor. Some medicines can make you feel dizzy. This can increase your chance of falling. Ask your doctor what other things that you can do to help prevent falls. This information is not intended to replace advice given to you by your health care provider. Make sure you discuss any questions you have with your health care provider. Document Released: 12/17/2008 Document Revised: 07/29/2015 Document Reviewed: 03/27/2014 Elsevier Interactive Patient Education  2017 [employer].

## 2022-06-28 NOTE — Progress Notes (Signed)
Subjective:   Andrea Byrd is a 77 y.o. female who presents for Medicare Annual (Subsequent) preventive examination.  I connected with  Andrea Byrd on 06/28/22 by a telephone enabled telemedicine application and verified that I am speaking with the correct person using two identifiers.   I discussed the limitations of evaluation and management by telemedicine. The patient expressed understanding and agreed to proceed.  Patient location: home  Provider location: telephone/home   Review of Systems     Cardiac Risk Factors include: advanced age (>79men, >35 women);family history of premature cardiovascular disease;hypertension;sedentary lifestyle     Objective:    Today's Vitals   06/28/22 1537  PainSc: 8    There is no height or weight on file to calculate BMI.     06/28/2022    3:38 PM 11/03/2021    6:22 AM 10/27/2021    1:45 PM 05/12/2021    8:16 AM 05/03/2020    8:06 AM 09/26/2017    1:25 PM 08/30/2016    8:44 AM  Advanced Directives  Does Patient Have a Medical Advance Directive? Yes Yes Yes Yes Yes Yes Yes  Type of Advance Directive Living will Healthcare Power of [employer] of Andrea Byrd;Living will Healthcare Power of [place];Living will Healthcare Power of [place];Living will Healthcare Power of [place];Living will Living will;Healthcare Power of Attorney  Does patient want to make changes to medical advance directive?  No - Patient declined No - Patient declined      Copy of Healthcare Power of Attorney in Chart? No - copy requested No - copy requested No - copy requested No - copy requested No - copy requested No - copy requested No - copy requested    Current Medications (verified) Outpatient Encounter Medications as of 06/28/2022  Medication Sig   dicyclomine (BENTYL) 10 MG capsule Take 1 capsule (10 mg total) by mouth every 4 (four) hours as needed for spasms.   FLUoxetine (PROZAC) 20 MG capsule TAKE 1 CAPSULE BY MOUTH EVERY DAY   furosemide  (LASIX) 20 MG tablet TAKE 1 TABLET BY MOUTH EVERY DAY   latanoprost (XALATAN) 0.005 % ophthalmic solution Place 1 drop into both eyes at bedtime.    losartan-hydrochlorothiazide (HYZAAR) 50-12.5 MG tablet TAKE 1 TABLET BY MOUTH EVERY DAY   omeprazole (PRILOSEC) 20 MG capsule Take 1 capsule (20 mg total) by mouth 2 (two) times daily. 30-60 minutes before breakfast and dinner.   potassium chloride SA (KLOR-CON M) 20 MEQ tablet Take 1 tablet (20 mEq total) by mouth daily.   pramipexole (MIRAPEX) 1 MG tablet Take 1 tablet (1 mg total) by mouth 3 (three) times daily.   tiZANidine (ZANAFLEX) 4 MG tablet TAKE 1 TABLET BY MOUTH THREE TIMES A DAY AS NEEDED FOR MUSCLE SPASM   Vitamin D, Ergocalciferol, (DRISDOL) 1.25 MG (50000 UNIT) CAPS capsule Take 1 capsule (50,000 Units total) by mouth every 7 (seven) days.   No facility-administered encounter medications on file as of 06/28/2022.    Allergies (verified) Nitrofurantoin, Propoxyphene n-acetaminophen, Nexium [esomeprazole magnesium], Nortriptyline hcl, Hydrocodone bit-homatrop mbr, and Morphine   History: Past Medical History:  Diagnosis Date   Anemia    Arthritis    Cervical cancer 1977   Diverticulosis    Endometriosis    Esophageal stricture    Gastritis    GERD (gastroesophageal reflux disease)    Glaucoma    Herpes simplex type 1 infection    Hiatal hernia    History of colon polyps  History of gallstones    Hypertension    IBS (irritable bowel syndrome)    Insomnia    Post-operative nausea and vomiting    Restless leg syndrome    Past Surgical History:  Procedure Laterality Date   ABDOMINAL HYSTERECTOMY  1998   APPENDECTOMY  1998   BUNIONECTOMY WITH HAMMERTOE RECONSTRUCTION Right 11/03/2021   Procedure: Right bunion correction with double osteotomy; Second Metatarsal weil; Second hammertoe correction; third through fifth tendon lengthening; excision of bunionette;  Surgeon: Toni Arthurs, MD;  Location: [facility];  Service: Orthopedics;  Laterality: Right;   CERVICAL CONE BIOPSY     CHOLECYSTECTOMY  1980   COLON RESECTION  2002   COLON SURGERY  2003   removed 2 feet colon   ESOPHAGEAL MANOMETRY N/A 04/24/2016   Procedure: ESOPHAGEAL MANOMETRY (EM);  Surgeon: Napoleon Form, MD;  Location: [facility];  Service: Endoscopy;  Laterality: N/A;   HAND SURGERY     right/ thumb joints replaced both hands   OOPHORECTOMY     TUBAL LIGATION     Family History  Problem Relation Age of Onset   Diabetes Mother    Stroke Mother        brother, sister, MGM   Colon polyps Mother    Heart disease Father    Kidney disease Father    Colon polyps Father    Lung disease Father    Kidney cancer Sister    Stroke Sister    Diabetes Sister    Colon polyps Sister    Celiac disease Other        niece   Heart attack Paternal Grandfather    Colon cancer Neg Hx    Esophageal cancer Neg Hx    Rectal cancer Neg Hx    Stomach cancer Neg Hx    Social History   Socioeconomic History   Marital status: Widowed    Spouse name: Not on file   Number of children: 1   Years of education: Not on file   Highest education level: Not on file  Occupational History   Occupation: Retired  Tobacco Use   Smoking status: Never   Smokeless tobacco: Never  Vaping Use   Vaping Use: Never used  Substance and Sexual Activity   Alcohol use: No   Drug use: No   Sexual activity: Not Currently  Other Topics Concern   Not on file  Social History Narrative   Lives with husband floyd   Drinks maybe 4 sodas a week   Social Determinants of Health   Financial Resource Strain: Low Risk  (06/28/2022)   Overall Financial Resource Strain (CARDIA)    Difficulty of Paying Living Expenses: Not hard at all  Food Insecurity: No Food Insecurity (06/28/2022)   Hunger Vital Sign    Worried About Running Out of Food in the Last Year: Never true    Ran Out of Food in the Last Year: Never true  Transportation Needs: No  Transportation Needs (06/28/2022)   PRAPARE - Administrator, Civil Service (Medical): No    Lack of Transportation (Non-Medical): No  Physical Activity: Inactive (06/28/2022)   Exercise Vital Sign    Days of Exercise per Week: 0 days    Minutes of Exercise per Session: 0 min  Stress: No Stress Concern Present (06/28/2022)   [employer] of Occupational Health - Occupational Stress Questionnaire    Feeling of Stress : Not at all  Social Connections: Moderately Integrated (06/28/2022)  Social Connection and Isolation Panel [NHANES]    Frequency of Communication with Friends and Family: More than three times a week    Frequency of Social Gatherings with Friends and Family: Three times a week    Attends Religious Services: More than 4 times per year    Active Member of Clubs or Organizations: Yes    Attends Banker Meetings: More than 4 times per year    Marital Status: Widowed    Tobacco Counseling Counseling given: Not Answered   Clinical Intake:  Pre-visit preparation completed: Yes  Pain : 0-10 Pain Score: 8  Pain Type: Chronic pain Pain Location: Shoulder Pain Orientation: Right Pain Descriptors / Indicators: Aching, Constant, Grimacing, Burning, Dull Pain Onset: 1 to 4 weeks ago Pain Frequency: Constant Pain Relieving Factors: naporsin  Pain Relieving Factors: naporsin  Diabetes: No  How often do you need to have someone help you when you read instructions, pamphlets, or other written materials from your doctor or pharmacy?: 1 - Never  Diabetic?  no     Information entered by :: Remi Haggard LPN   Activities of Daily Living    06/28/2022    3:40 PM 02/22/2022    8:02 AM  In your present state of health, do you have any difficulty performing the following activities:  Hearing? 1 0  Vision? 0 0  Difficulty concentrating or making decisions? 0 0  Walking or climbing stairs? 0 0  Dressing or bathing? 0 0  Doing errands, shopping? 0  0  Preparing Food and eating ? N   Using the Toilet? N   In the past six months, have you accidently leaked urine? N   Do you have problems with loss of bowel control? N   Managing your Medications? N   Managing your Finances? N   Housekeeping or managing your Housekeeping? N     Patient Care Team: Sheliah Hatch, MD as PCP - General (Family Medicine) Napoleon Form, MD as Consulting Physician (Gastroenterology) Suanne Marker, MD as Consulting Physician (Neurology) Aris Lot, MD as Consulting Physician (Dermatology) [facility] (Specialist) Erroll Luna, [facility] (Pharmacist)  Indicate any recent Medical Services you may have received from other than [facility] providers in the past year (date may be approximate).     Assessment:   This is a routine wellness examination for Desarai.  Hearing/Vision screen Hearing Screening - Comments:: No hearing aids Vision Screening - Comments:: Up to date Dr. Randon Goldsmith   Dietary issues and exercise activities discussed: Current Exercise Habits: The patient does not participate in regular exercise at present   Goals Addressed   None    Depression Screen    06/28/2022    3:44 PM 04/20/2022    2:45 PM 02/22/2022    8:03 AM 11/23/2021    9:42 AM 09/21/2021    9:25 AM 08/19/2021    1:56 PM 05/12/2021    8:17 AM  PHQ 2/9 Scores  PHQ - 2 Score 0 0 0 0 1 2 0  PHQ- 9 Score 0 10 10     Fall Risk    06/28/2022    3:38 PM 04/20/2022    2:45 PM 02/22/2022    8:03 AM 11/23/2021    9:42 AM 09/21/2021    9:26 AM  Fall Risk   Falls in the past year? Number falls in past yr: 0 0  Injury with Fall? 0 0  1 0 1  Risk for fall due to :  History of fall(s) History of fall(s) History of fall(s) History of fall(s)  Follow up Falls evaluation completed;Education provided;Falls prevention discussed Falls evaluation completed Falls evaluation completed Falls evaluation completed Falls evaluation completed    FALL  RISK PREVENTION PERTAINING TO THE HOME:  Any stairs in or around the home? No  If so, are there any without handrails? No  Home free of loose throw rugs in walkways, pet beds, electrical cords, etc? Yes  Adequate lighting in your home to reduce risk of falls? Yes   ASSISTIVE DEVICES UTILIZED TO PREVENT FALLS:  Life alert? No  Use of a cane, walker or w/c? No  Grab bars in the bathroom? Yes  Shower chair or bench in shower? Yes  Elevated toilet seat or a handicapped toilet? Yes   TIMED UP AND GO:  Was the test performed? No .    Cognitive Function:    09/26/2017    1:26 PM  MMSE - Mini Mental State Exam  Orientation to time 5  Orientation to Place 5  Registration 3  Attention/ Calculation 5  Recall 3  Language- name 2 objects 2  Language- repeat 1  Language- follow 3 step command 3  Language- read & follow direction 1  Write a sentence 1  Copy design 1  Total score 30        06/28/2022    3:41 PM  6CIT Screen  What Year? 0 points  What month? 0 points  What time? 0 points  Count back from 20 2 points  Months in reverse 0 points  Repeat phrase 2 points  Total Score 4 points    Immunizations Immunization History  Administered Date(s) Administered   Fluad Quad(high Dose 65+) 11/10/2018, 12/28/2019, 12/24/2020, 01/01/2022   Influenza, High Dose Seasonal PF 01/11/2018   Influenza,inj,Quad PF,6+ Mos 12/23/2014, 10/24/2015, 11/14/2016   Influenza-Unspecified 01/01/2022   Moderna Sars-Covid-2 Vaccination 04/11/2019, 05/10/2019, 03/03/2020   Pneumococcal Conjugate-13 09/09/2013   Pneumococcal Polysaccharide-23 12/20/2011   Tdap 07/28/2005    TDAP status: Due, Education has been provided regarding the importance of this vaccine. Advised may receive this vaccine at local pharmacy or Health Dept. Aware to provide a copy of the vaccination record if obtained from local pharmacy or Health Dept. Verbalized acceptance and understanding.  Flu Vaccine status: Up to  date  Pneumococcal vaccine status: Up to date  Covid-19 vaccine status: Information provided on how to obtain vaccines.   Qualifies for Shingles Vaccine? Yes   Zostavax completed No   Shingrix Completed?: No.    Education has been provided regarding the importance of this vaccine. Patient has been advised to call insurance company to determine out of pocket expense if they have not yet received this vaccine. Advised may also receive vaccine at local pharmacy or Health Dept. Verbalized acceptance and understanding.  Screening Tests Health Maintenance  Topic Date Due   INFLUENZA VACCINE  10/05/2022   Medicare Annual Wellness (AWV)  06/28/2023   Pneumonia Vaccine 26+ Years old  Completed   DEXA SCAN  Completed   HPV VACCINES  Aged Out   DTaP/Tdap/Td  Discontinued   COLONOSCOPY (Pts 45-60yrs Insurance coverage will need to be confirmed)  Discontinued   COVID-19 Vaccine  Discontinued   Hepatitis C Screening  Discontinued   Zoster Vaccines- Shingrix  Discontinued    Health Maintenance  There are no preventive care reminders to display for this patient.   Colorectal cancer screening: Type  of screening: Colonoscopy. Completed 2023. Repeat every 0 years  Mammogram status: Completed 2024. Repeat every year  Bone Density status: Completed 2024. Results reflect: Bone density results: OSTEOPENIA. Repeat every 2 years.  Lung Cancer Screening: (Low Dose CT Chest recommended if Age 36-80 years, 30 pack-year currently smoking OR have quit w/in 15years.) does not qualify.   Lung Cancer Screening Referral:   Additional Screening:  Hepatitis C Screening: does qualify never done  Vision Screening: Recommended annual ophthalmology exams for early detection of glaucoma and other disorders of the eye. Is the patient up to date with their annual eye exam?  Yes  Who is the provider or what is the name of the office in which the patient attends annual eye exams? [facility] If pt is not established  with a provider, would they like to be referred to a provider to establish care? No .   Dental Screening: Recommended annual dental exams for proper oral hygiene  Community Resource Referral / Chronic Care Management: CRR required this visit?  No   CCM required this visit?  No      Plan:     I have personally reviewed and noted the following in the patient's chart:   Medical and social history Use of alcohol, tobacco or illicit drugs  Current medications and supplements including opioid prescriptions. Patient is not currently taking opioid prescriptions. Functional ability and status Nutritional status Physical activity Advanced directives List of other physicians Hospitalizations, surgeries, and ER visits in previous 12 months Vitals Screenings to include cognitive, depression, and falls Referrals and appointments  In addition, I have reviewed and discussed with patient certain preventive protocols, quality metrics, and best practice recommendations. A written personalized care plan for preventive services as well as general preventive health recommendations were provided to patient.     Remi Haggard, LPN   11/18/7827   Nurse Notes:

## 2022-07-10 DIAGNOSIS — Z471 Aftercare following joint replacement surgery: Secondary | ICD-10-CM | POA: Diagnosis not present

## 2022-07-10 DIAGNOSIS — Z96611 Presence of right artificial shoulder joint: Secondary | ICD-10-CM | POA: Diagnosis not present

## 2022-07-20 ENCOUNTER — Other Ambulatory Visit: Payer: Self-pay | Admitting: Physician Assistant

## 2022-07-27 ENCOUNTER — Other Ambulatory Visit: Payer: Self-pay | Admitting: Family Medicine

## 2022-07-27 NOTE — Telephone Encounter (Signed)
Patient is requesting a refill of the following medications: Requested Prescriptions   Pending Prescriptions Disp Refills   tiZANidine (ZANAFLEX) 4 MG tablet [Pharmacy Med Name: TIZANIDINE HCL 4 MG TABLET] 270 tablet 1    Sig: TAKE 1 TABLET BY MOUTH THREE TIMES A DAY AS NEEDED FOR MUSCLE SPASM    Date of patient request: 07/27/22 Last office visit: 04/20/22 Date of last refill: 01/23/22 Last refill amount: 270

## 2022-08-03 IMAGING — MG MM DIGITAL SCREENING BILAT W/ TOMO AND CAD
8 series · 9 of 24 positions shown · non-contrast
Comparison: Previous exam(s).

CLINICAL DATA: Screening.

EXAM:
DIGITAL SCREENING BILATERAL MAMMOGRAM WITH TOMOSYNTHESIS AND CAD
TECHNIQUE: Bilateral screening digital craniocaudal and mediolateral oblique
mammograms were obtained. Bilateral screening digital breast
tomosynthesis was performed. The images were evaluated with
computer-aided detection.

[L MLO synth-2D]
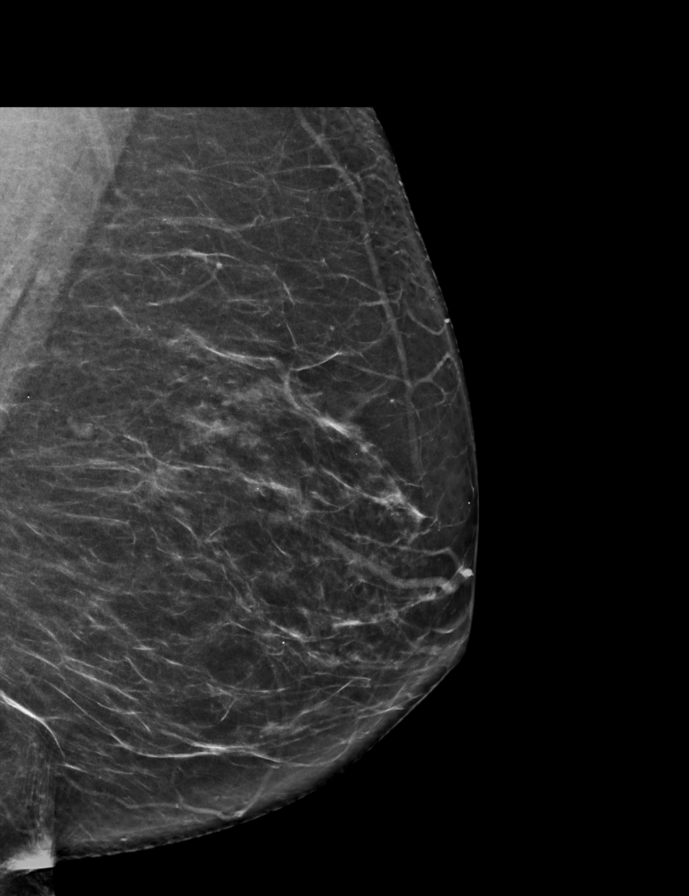

[R MLO synth-2D]
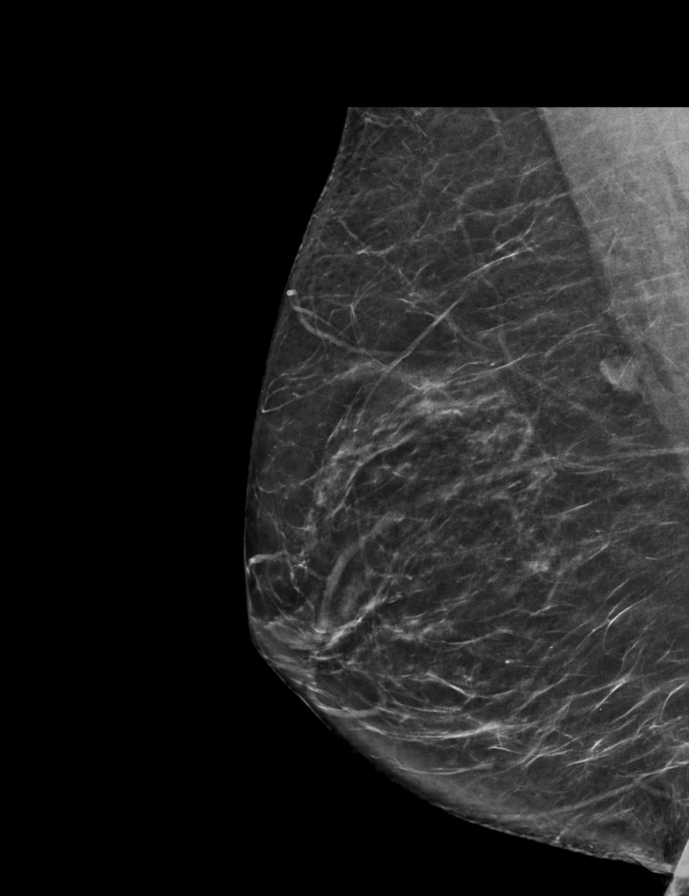

[L CC synth-2D]
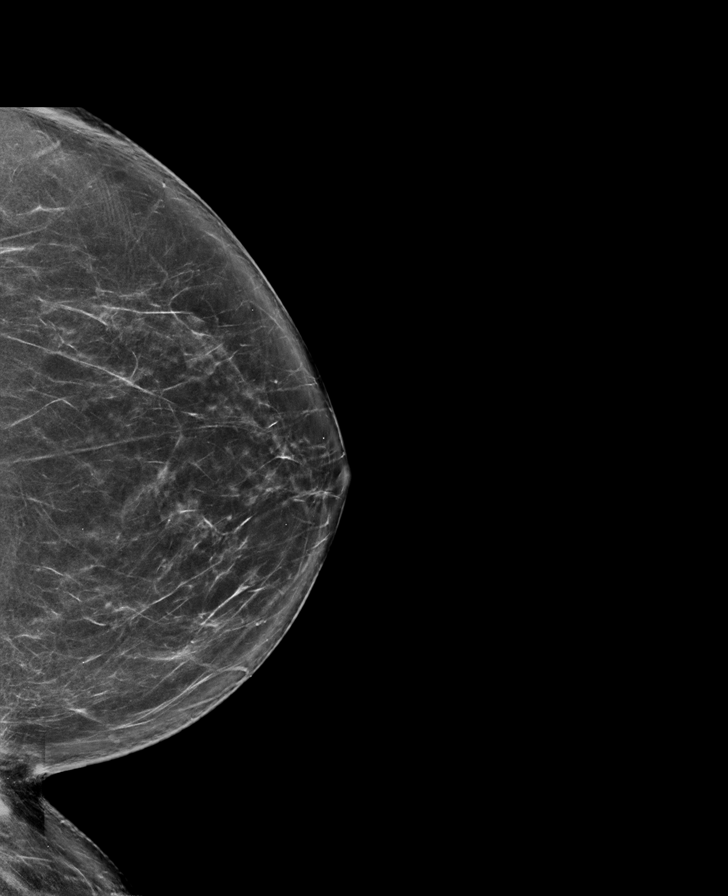

[R CC synth-2D]
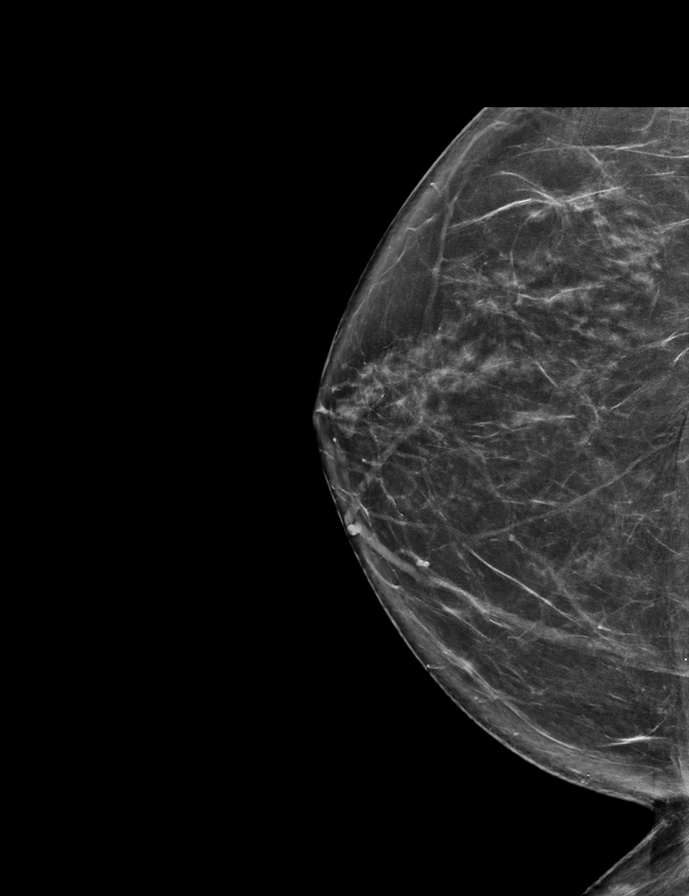

[L MLO tomo · 2 of 75 frames shown]
[frame 25/75]
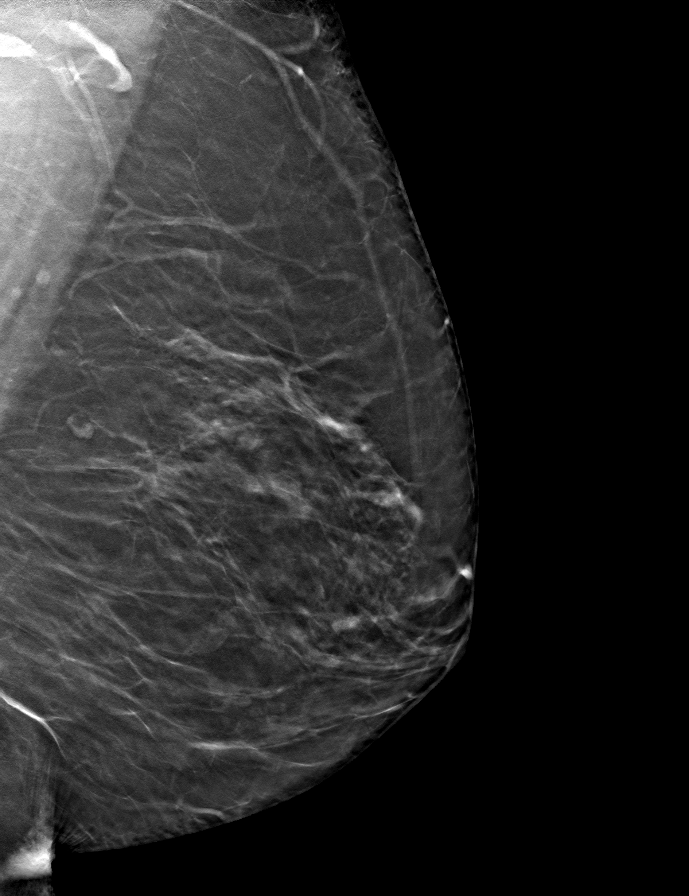
[frame 38/75]
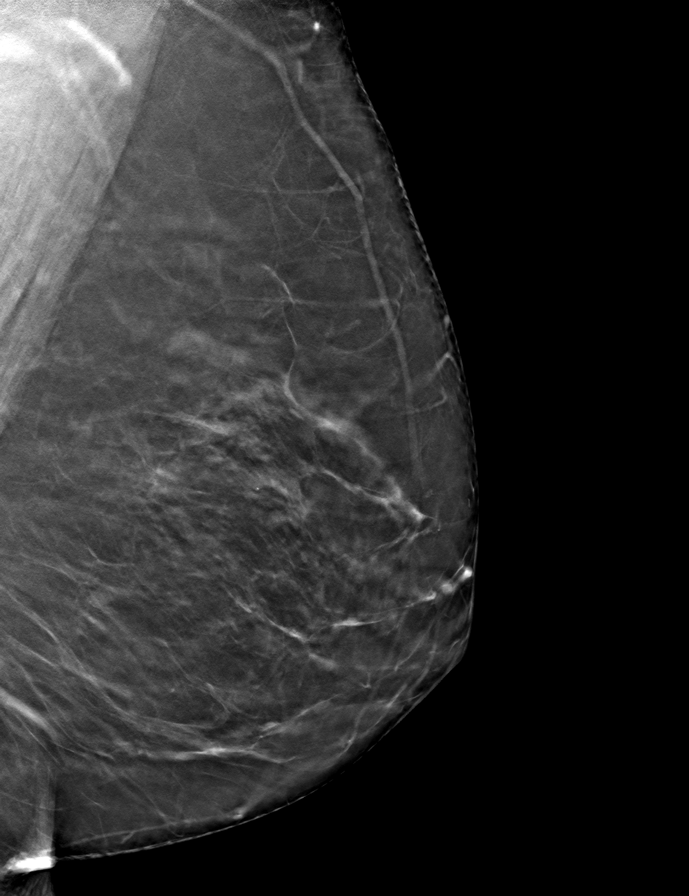

[R CC tomo · tomo slice 38/75.0]
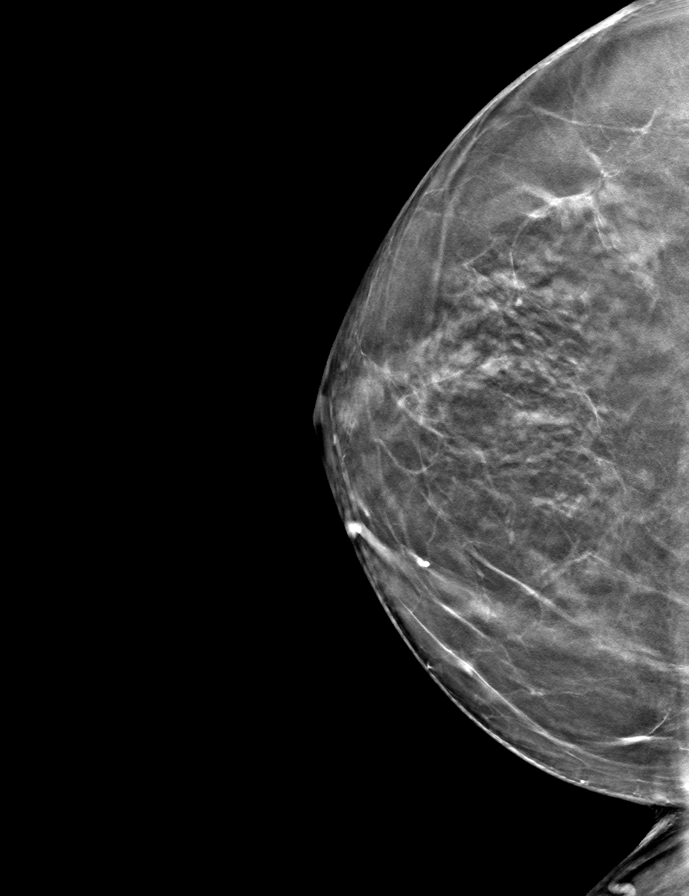

[R MLO tomo · tomo slice 36/71.0]
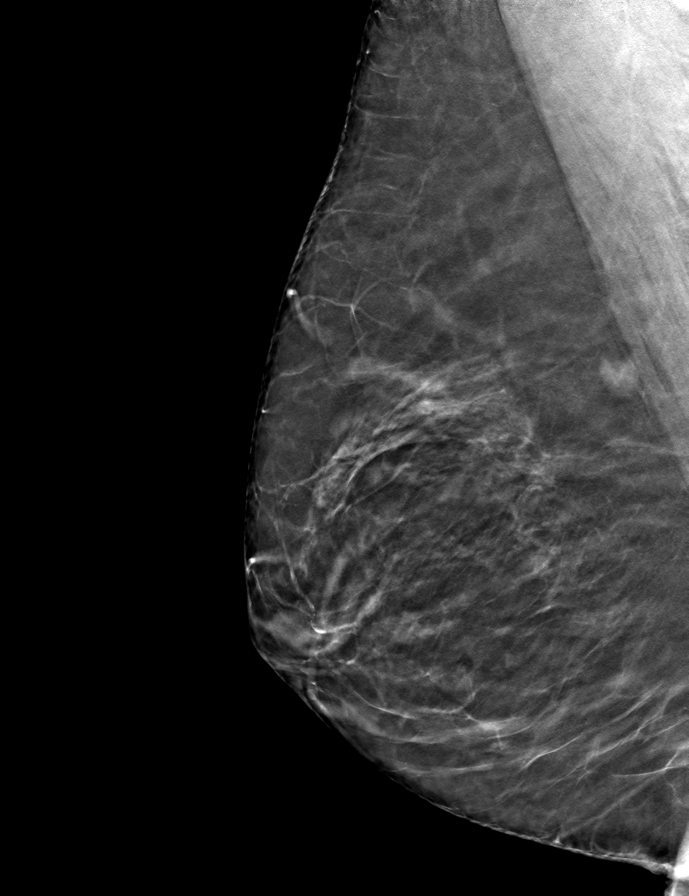

[L CC tomo · tomo slice 41/80.0]
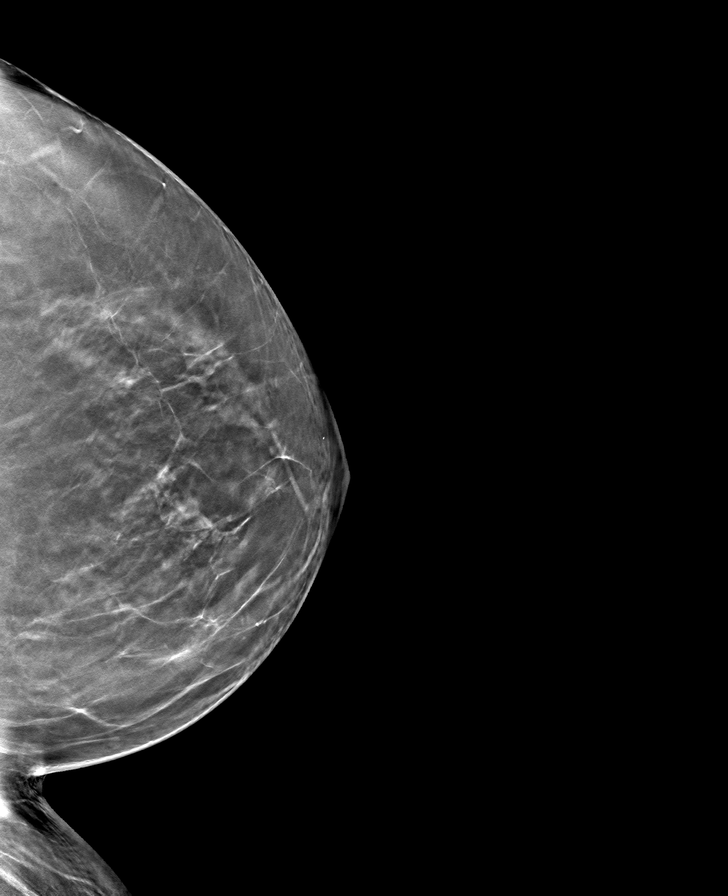

[9 of 24 positions shown; findings below may reference images not displayed]

ACR Breast Density Category b: There are scattered areas of
fibroglandular density.
FINDINGS: There are no findings suspicious for malignancy.
IMPRESSION: No mammographic evidence of malignancy. A result letter of this
screening mammogram will be mailed directly to the patient.

RECOMMENDATION:
Screening mammogram in one year. (Code:51-O-LD2)

BI-RADS CATEGORY  1: Negative.

## 2022-08-09 ENCOUNTER — Ambulatory Visit: Payer: Medicare HMO | Admitting: Physician Assistant

## 2022-08-09 DIAGNOSIS — Z96611 Presence of right artificial shoulder joint: Secondary | ICD-10-CM | POA: Diagnosis not present

## 2022-08-11 DIAGNOSIS — M79671 Pain in right foot: Secondary | ICD-10-CM | POA: Diagnosis not present

## 2022-08-11 DIAGNOSIS — M2011 Hallux valgus (acquired), right foot: Secondary | ICD-10-CM | POA: Diagnosis not present

## 2022-08-11 DIAGNOSIS — M2041 Other hammer toe(s) (acquired), right foot: Secondary | ICD-10-CM | POA: Diagnosis not present

## 2022-08-11 DIAGNOSIS — T8484XA Pain due to internal orthopedic prosthetic devices, implants and grafts, initial encounter: Secondary | ICD-10-CM | POA: Diagnosis not present

## 2022-08-11 DIAGNOSIS — M21621 Bunionette of right foot: Secondary | ICD-10-CM | POA: Diagnosis not present

## 2022-08-15 DIAGNOSIS — M21611 Bunion of right foot: Secondary | ICD-10-CM | POA: Diagnosis not present

## 2022-08-15 DIAGNOSIS — M2041 Other hammer toe(s) (acquired), right foot: Secondary | ICD-10-CM | POA: Diagnosis not present

## 2022-08-15 DIAGNOSIS — T8484XA Pain due to internal orthopedic prosthetic devices, implants and grafts, initial encounter: Secondary | ICD-10-CM | POA: Diagnosis not present

## 2022-08-15 DIAGNOSIS — M24574 Contracture, right foot: Secondary | ICD-10-CM | POA: Diagnosis not present

## 2022-08-15 HISTORY — PX: OTHER SURGICAL HISTORY: SHX169

## 2022-08-24 ENCOUNTER — Ambulatory Visit (INDEPENDENT_AMBULATORY_CARE_PROVIDER_SITE_OTHER): Payer: Medicare HMO | Admitting: Family Medicine

## 2022-08-24 ENCOUNTER — Encounter: Payer: Self-pay | Admitting: Family Medicine

## 2022-08-24 VITALS — BP 128/82 | HR 82 | Temp 98.1°F | Resp 18 | Ht 61.0 in | Wt 192.2 lb

## 2022-08-24 DIAGNOSIS — E669 Obesity, unspecified: Secondary | ICD-10-CM

## 2022-08-24 DIAGNOSIS — I10 Essential (primary) hypertension: Secondary | ICD-10-CM | POA: Diagnosis not present

## 2022-08-24 DIAGNOSIS — Z6836 Body mass index (BMI) 36.0-36.9, adult: Secondary | ICD-10-CM

## 2022-08-24 LAB — BASIC METABOLIC PANEL
BUN: 11 mg/dL (ref 6–23)
CO2: 30 mEq/L (ref 19–32)
Calcium: 9.1 mg/dL (ref 8.4–10.5)
Chloride: 104 mEq/L (ref 96–112)
Creatinine, Ser: 0.76 mg/dL (ref 0.40–1.20)
GFR: 76.14 mL/min (ref >60.00)
Glucose, Bld: 100 mg/dL — ABNORMAL HIGH (ref 70–99)
Potassium: 4.2 mEq/L (ref 3.5–5.1)
Sodium: 141 mEq/L (ref 135–145)

## 2022-08-24 LAB — CBC WITH DIFFERENTIAL/PLATELET
Basophils Absolute: 0.1 10*3/uL (ref 0.0–0.1)
Basophils Relative: 1.2 % (ref 0.0–3.0)
Eosinophils Absolute: 0.2 10*3/uL (ref 0.0–0.7)
Eosinophils Relative: 3 % (ref 0.0–5.0)
HCT: 34.3 % — ABNORMAL LOW (ref 36.0–46.0)
Hemoglobin: 10.9 g/dL — ABNORMAL LOW (ref 12.0–15.0)
Lymphocytes Relative: 23.8 % (ref 12.0–46.0)
Lymphs Abs: 1.3 10*3/uL (ref 0.7–4.0)
MCHC: 31.7 g/dL (ref 30.0–36.0)
MCV: 85.3 fl (ref 78.0–100.0)
Monocytes Absolute: 0.4 10*3/uL (ref 0.1–1.0)
Monocytes Relative: 6.5 % (ref 3.0–12.0)
Neutro Abs: 3.5 10*3/uL (ref 1.4–7.7)
Neutrophils Relative %: 65.5 % (ref 43.0–77.0)
Platelets: 332 10*3/uL (ref 150.0–400.0)
RBC: 4.02 Mil/uL (ref 3.87–5.11)
RDW: 14.6 % (ref 11.5–15.5)
WBC: 5.4 10*3/uL (ref 4.0–10.5)

## 2022-08-24 LAB — HEPATIC FUNCTION PANEL
ALT: 11 U/L (ref 0–35)
AST: 17 U/L (ref 0–37)
Albumin: 3.7 g/dL (ref 3.5–5.2)
Alkaline Phosphatase: 130 U/L — ABNORMAL HIGH (ref 39–117)
Bilirubin, Direct: 0.1 mg/dL (ref 0.0–0.3)
Total Bilirubin: 0.3 mg/dL (ref 0.2–1.2)
Total Protein: 6.5 g/dL (ref 6.0–8.3)

## 2022-08-24 LAB — LIPID PANEL
Cholesterol: 149 mg/dL (ref 0–200)
HDL: 42.9 mg/dL (ref >39.00)
LDL Cholesterol: 89 mg/dL (ref 0–99)
NonHDL: 105.75
Total CHOL/HDL Ratio: 3
Triglycerides: 83 mg/dL (ref 0.0–149.0)
VLDL: 16.6 mg/dL (ref 0.0–40.0)

## 2022-08-24 LAB — TSH: TSH: 1.58 u[IU]/mL (ref 0.35–5.50)

## 2022-08-24 MED ORDER — FUROSEMIDE 20 MG PO TABS: 20.0000 mg | ORAL_TABLET | Freq: Every day | ORAL | 1 refills | Status: DC

## 2022-08-24 MED ORDER — FLUOXETINE HCL 20 MG PO CAPS: 20.0000 mg | ORAL_CAPSULE | Freq: Every day | ORAL | 1 refills | Status: DC

## 2022-08-24 NOTE — Patient Instructions (Signed)
Schedule your complete physical in 6 months We'll notify you of your lab results and make any changes if needed Try and limit your boredom snacking Walk as you are able Call with any questions or concerns Stay Safe!  Stay Healthy! Hang in there!!!

## 2022-08-24 NOTE — Assessment & Plan Note (Signed)
Chronic problem.  On Losartan hydrochlorothiazide 50/12.5mg  daily and Lasix 20mg  daily w/o difficulty.  BP well controlled.  Check labs due to ARB and diuretic use but no anticipated med changes.

## 2022-08-24 NOTE — Progress Notes (Signed)
   Subjective:    Patient ID: Andrea Byrd, female    DOB: 1945-11-18, 77 y.o.   MRN: [id number]  HPI HTN- chronic problem, on Losartan hydrochlorothiazide 50/12.5mg  daily and Lasix 20mg  daily.  Well controlled.  Denies CP, SOB, HA's, visual changes, edema.  Obesity- weight and BMI are stable at 192 lbs and 36.33 respectively.  Has not been able to exercise due to multiple shoulder surgeries and foot surgeries.   Review of Systems For ROS see HPI     Objective:   Physical Exam Vitals reviewed.  Constitutional:      General: She is not in acute distress.    Appearance: Normal appearance. She is well-developed. She is obese. She is not ill-appearing.  HENT:     Head: Normocephalic and atraumatic.  Eyes:     Conjunctiva/sclera: Conjunctivae normal.     Pupils: Pupils are equal, round, and reactive to light.  Neck:     Thyroid: No thyromegaly.  Cardiovascular:     Rate and Rhythm: Normal rate and regular rhythm.     Pulses: Normal pulses.     Heart sounds: Normal heart sounds. No murmur heard. Pulmonary:     Effort: Pulmonary effort is normal. No respiratory distress.     Breath sounds: Normal breath sounds.  Abdominal:     General: There is no distension.     Palpations: Abdomen is soft.     Tenderness: There is no abdominal tenderness.  Musculoskeletal:     Cervical back: Normal range of motion and neck supple.     Right lower leg: No edema.     Left lower leg: No edema.  Lymphadenopathy:     Cervical: No cervical adenopathy.  Skin:    General: Skin is warm and dry.  Neurological:     General: No focal deficit present.     Mental Status: She is alert and oriented to person, place, and time.  Psychiatric:        Mood and Affect: Mood normal.        Behavior: Behavior normal.        Thought Content: Thought content normal.           Assessment & Plan:

## 2022-08-24 NOTE — Assessment & Plan Note (Signed)
Ongoing issue for pt.  Weight and BMI are stable.  Unfortunately pt has not been able to exercise due to multiple recent surgeries.  Will continue to follow.

## 2022-08-25 ENCOUNTER — Other Ambulatory Visit (INDEPENDENT_AMBULATORY_CARE_PROVIDER_SITE_OTHER): Payer: Medicare HMO

## 2022-08-25 ENCOUNTER — Telehealth: Payer: Self-pay

## 2022-08-25 ENCOUNTER — Other Ambulatory Visit: Payer: Self-pay

## 2022-08-25 DIAGNOSIS — R748 Abnormal levels of other serum enzymes: Secondary | ICD-10-CM

## 2022-08-25 DIAGNOSIS — H26493 Other secondary cataract, bilateral: Secondary | ICD-10-CM | POA: Diagnosis not present

## 2022-08-25 DIAGNOSIS — H52203 Unspecified astigmatism, bilateral: Secondary | ICD-10-CM | POA: Diagnosis not present

## 2022-08-25 DIAGNOSIS — H5010 Unspecified exotropia: Secondary | ICD-10-CM | POA: Diagnosis not present

## 2022-08-25 DIAGNOSIS — Z961 Presence of intraocular lens: Secondary | ICD-10-CM | POA: Diagnosis not present

## 2022-08-25 DIAGNOSIS — H401232 Low-tension glaucoma, bilateral, moderate stage: Secondary | ICD-10-CM | POA: Diagnosis not present

## 2022-08-25 LAB — GAMMA GT: GGT: 24 U/L (ref 7–51)

## 2022-08-25 NOTE — Telephone Encounter (Signed)
-----   Message from Sheliah Hatch, MD sent at 08/25/2022  7:33 AM EDT ----- Labs are stable and look good!  Your Alk Phos is mildly elevated but I suspect this is due to recent surgery.  We will add a GGT to determine whether this is liver related or surgery related (dx elevated alk phos) but I suspect this is nothing to worry about.  Your hemoglobin (blood count) has dropped just a little- which again, is likely due to surgery.  Please make sure you are taking a multivitamin w/ iron to help build this back up.

## 2022-08-25 NOTE — Telephone Encounter (Signed)
I finally got a hold of the pt and we discussed the lab results

## 2022-08-25 NOTE — Telephone Encounter (Signed)
-----   Message from Sheliah Hatch, MD sent at 08/25/2022  4:08 PM EDT ----- As suspected, no evidence of liver involvement.  Great news!

## 2022-08-25 NOTE — Telephone Encounter (Signed)
I advised pt of the results

## 2022-08-25 NOTE — Telephone Encounter (Signed)
Left pt a vm to call office in regards to the lab results and the add GGT has been sent to lab

## 2022-09-11 DIAGNOSIS — M2041 Other hammer toe(s) (acquired), right foot: Secondary | ICD-10-CM | POA: Diagnosis not present

## 2022-09-13 DIAGNOSIS — Z96611 Presence of right artificial shoulder joint: Secondary | ICD-10-CM | POA: Diagnosis not present

## 2022-09-15 DIAGNOSIS — M2041 Other hammer toe(s) (acquired), right foot: Secondary | ICD-10-CM | POA: Diagnosis not present

## 2022-09-15 DIAGNOSIS — T8484XA Pain due to internal orthopedic prosthetic devices, implants and grafts, initial encounter: Secondary | ICD-10-CM | POA: Diagnosis not present

## 2022-09-18 ENCOUNTER — Telehealth: Payer: Self-pay | Admitting: Family Medicine

## 2022-09-18 NOTE — Telephone Encounter (Signed)
Pt is on a 10 day abx of doxy pt understood and will call surgeon

## 2022-09-18 NOTE — Telephone Encounter (Signed)
Patient called and stated that she had toe surgery and it became infected and she requested IV antibiotics. The surgeon instructed her that to do so, she would need to be admitted to the hospital. Patient asked can that not be done as an outpatient procedure at an infusion center. I told her I wasn't aware of that possibility and that if she was instructed to be admitted for that, then that's what she should do. She is concerned about losing her big toe, and stated that an IV antibiotic will get in her system faster than a pill. She still wanted me to send Dr. Beverely Low a message for her input and advice.

## 2022-09-18 NOTE — Telephone Encounter (Signed)
She needs to follow whatever advice was given by the surgeon- in this case, it sounds like she needs to be admitted.  Especially if she is worried about losing her toe.  She needs to have an appt w/ her surgeon ASAP for evaluation and treatment or she needs to be admitted under their direction

## 2022-09-25 DIAGNOSIS — T8484XA Pain due to internal orthopedic prosthetic devices, implants and grafts, initial encounter: Secondary | ICD-10-CM | POA: Diagnosis not present

## 2022-09-25 DIAGNOSIS — M7741 Metatarsalgia, right foot: Secondary | ICD-10-CM | POA: Diagnosis not present

## 2022-09-26 DIAGNOSIS — G589 Mononeuropathy, unspecified: Secondary | ICD-10-CM | POA: Diagnosis not present

## 2022-09-26 DIAGNOSIS — Z0189 Encounter for other specified special examinations: Secondary | ICD-10-CM | POA: Diagnosis not present

## 2022-09-26 DIAGNOSIS — T84293A Other mechanical complication of internal fixation device of bones of foot and toes, initial encounter: Secondary | ICD-10-CM | POA: Diagnosis not present

## 2022-09-26 DIAGNOSIS — T8484XA Pain due to internal orthopedic prosthetic devices, implants and grafts, initial encounter: Secondary | ICD-10-CM | POA: Diagnosis not present

## 2022-10-16 ENCOUNTER — Other Ambulatory Visit: Payer: Self-pay | Admitting: Orthopedic Surgery

## 2022-10-16 ENCOUNTER — Encounter: Payer: Self-pay | Admitting: Orthopedic Surgery

## 2022-10-16 ENCOUNTER — Other Ambulatory Visit: Payer: Self-pay | Admitting: Physician Assistant

## 2022-10-16 DIAGNOSIS — Z96611 Presence of right artificial shoulder joint: Secondary | ICD-10-CM | POA: Diagnosis not present

## 2022-10-17 ENCOUNTER — Ambulatory Visit
Admission: RE | Admit: 2022-10-17 | Discharge: 2022-10-17 | Disposition: A | Discharge status: Home / Self Care | Payer: Medicare HMO | Source: Ambulatory Visit | Attending: Orthopedic Surgery | Admitting: Orthopedic Surgery

## 2022-10-17 ENCOUNTER — Other Ambulatory Visit: Payer: Medicare HMO

## 2022-10-17 DIAGNOSIS — S42121D Displaced fracture of acromial process, right shoulder, subsequent encounter for fracture with routine healing: Secondary | ICD-10-CM | POA: Diagnosis not present

## 2022-10-17 DIAGNOSIS — Z96611 Presence of right artificial shoulder joint: Secondary | ICD-10-CM

## 2022-10-17 DIAGNOSIS — Z89231 Acquired absence of right shoulder: Secondary | ICD-10-CM | POA: Diagnosis not present

## 2022-10-30 ENCOUNTER — Other Ambulatory Visit: Payer: Self-pay | Admitting: Physician Assistant

## 2022-10-30 DIAGNOSIS — Z4789 Encounter for other orthopedic aftercare: Secondary | ICD-10-CM | POA: Diagnosis not present

## 2022-10-30 DIAGNOSIS — Z96611 Presence of right artificial shoulder joint: Secondary | ICD-10-CM | POA: Diagnosis not present

## 2022-11-01 ENCOUNTER — Encounter: Payer: Self-pay | Admitting: Physician Assistant

## 2022-11-01 ENCOUNTER — Ambulatory Visit: Payer: Medicare HMO | Admitting: Physician Assistant

## 2022-11-01 VITALS — BP 138/82 | HR 79 | Ht 61.0 in | Wt 193.8 lb

## 2022-11-01 DIAGNOSIS — K5900 Constipation, unspecified: Secondary | ICD-10-CM | POA: Diagnosis not present

## 2022-11-01 DIAGNOSIS — R1013 Epigastric pain: Secondary | ICD-10-CM

## 2022-11-01 DIAGNOSIS — R142 Eructation: Secondary | ICD-10-CM | POA: Diagnosis not present

## 2022-11-01 NOTE — Progress Notes (Signed)
Chief Complaint: Follow up GERD, Burping, Constipation, Epigastric pain  HPI:    Andrea Byrd is a 78 year old female with a past medical history as listed below including cervical cancer, GERD, IBS and multiple others, previously known to Dr. Orvan Falconer, who returns to clinic today for follow-up of her GERD, burping, constipation and epigastric discomfort.    05/09/2022 patient seen in clinic by Dr. Orvan Falconer and at that time following up for dysphagia as well as chronic constipation and history of polyps.  At that point increase Omeprazole to 40 mg twice a day and referral to healthy weight and wellness.  Discussed barium esophagram if dysphagia did not improve.    Today, the patient describes that she is having a lot of issues with burping gas, epigastric discomfort and worsening constipation.  Tells me during a typical day she will wake up and have a lot of burping and gas and feel just "yucky", she eats breakfast around 8 or 830 and takes all of her medicine in about 30 minutes after she has a lot of epigastric discomfort and cramping which just makes her feel uncomfortable, often times she continues to feel "gross" over lunchtime so she does not eat and will have dinner around 4:00 when she is feeling a little bit better.  Then the cycle starts over again about 30 minutes after eating.  Between this time she feels like she has to have a bowel movement really cannot get much out but little bits.  Currently using her Benefiber every day and a stool softener in the evenings.  Tells me that she never feels like she is completely emptied.  Currently using Omeprazole 20 mg twice a day.    Does tell me she has been using Naproxen twice daily for the past 4 months or so given a shoulder replacement.    Denies fever, chills, weight loss, melena, nausea or vomiting.  Reviewed GI workup: 02/09/2012 colonoscopy with diverticulosis in sigmoid anastomosis, unable to retroflex in the rectum due to small rectal vault,  repeat recommended 10 years.    08/21/2016 EGD with LA grade C esophagitis, esophageal stricture dilated with 20 mm TTS balloon and gastritis.    08/25/2019 office visit with Dr. Lavon Paganini.  At that time was there for follow-up of GERD and right lower quadrant abdominal pain.  Continued with some persistent lower abdominal pain.  Discussed CT with moderate hiatal hernia, sigmoid diverticulosis and multilevel degenerative changes in the lumbar spine.   01/19/22 office visit with Hyacinth Meeker, PA for solid food dysphagia without heartburn or reflux symptoms.    02/16/22: EGD with empiric balloon dilation to 18mm with minimal resistance to a fully inflated balloon, medium-sized hiatal hernia. Esophageal biopsies showed reflux esophagitis.    02/16/22: Colonoscopy showed pancolonic diverticulosis, a 5mm transverse inflammatory polyp, and 2 small right sided tubular adenomas. Surveillance colonoscopy not recommended due to advanced age.   Past Medical History:  Diagnosis Date   Anemia    Arthritis    Cervical cancer (HCC) 1977   Diverticulosis    Endometriosis    Esophageal stricture    Gastritis    GERD (gastroesophageal reflux disease)    Glaucoma    Herpes simplex type 1 infection    Hiatal hernia    History of colon polyps    History of gallstones    Hypertension    IBS (irritable bowel syndrome)    Insomnia    Post-operative nausea and vomiting    Restless leg syndrome  Past Surgical History:  Procedure Laterality Date   ABDOMINAL HYSTERECTOMY  1998   APPENDECTOMY  1998   BUNIONECTOMY WITH HAMMERTOE RECONSTRUCTION Right 11/03/2021   Procedure: Right bunion correction with double osteotomy; Second Metatarsal weil; Second hammertoe correction; third through fifth tendon lengthening; excision of bunionette;  Surgeon: Toni Arthurs, MD;  Location: [facility];  Service: Orthopedics;  Laterality: Right;   CERVICAL CONE BIOPSY     CHOLECYSTECTOMY  1980   COLON RESECTION   2002   COLON SURGERY  2003   removed 2 feet colon   ESOPHAGEAL MANOMETRY N/A 04/24/2016   Procedure: ESOPHAGEAL MANOMETRY (EM);  Surgeon: Napoleon Form, MD;  Location: [facility];  Service: Endoscopy;  Laterality: N/A;   HAND SURGERY     right/ thumb joints replaced both hands   OOPHORECTOMY     right foot surgery Right 08/15/2022   SHOULDER ARTHROSCOPY  02/28/2022   TOTAL SHOULDER REPLACEMENT Right 06/19/2022   TUBAL LIGATION      Current Outpatient Medications  Medication Sig Dispense Refill   dicyclomine (BENTYL) 10 MG capsule Take 1 capsule (10 mg total) by mouth every 4 (four) hours as needed for spasms. 30 capsule 5   FLUoxetine (PROZAC) 20 MG capsule Take 1 capsule (20 mg total) by mouth daily. 90 capsule 1   furosemide (LASIX) 20 MG tablet Take 1 tablet (20 mg total) by mouth daily. 90 tablet 1   latanoprost (XALATAN) 0.005 % ophthalmic solution Place 1 drop into both eyes at bedtime.      losartan-hydrochlorothiazide (HYZAAR) 50-12.5 MG tablet TAKE 1 TABLET BY MOUTH EVERY DAY 90 tablet 1   omeprazole (PRILOSEC) 20 MG capsule TAKE 1 CAPSULE 2 TIMES DAILY 30-60 MINUTES BEFORE BREAKFAST AND DINNER. 180 capsule 1   potassium chloride SA (KLOR-CON M) 20 MEQ tablet Take 1 tablet (20 mEq total) by mouth daily. 90 tablet 1   pramipexole (MIRAPEX) 1 MG tablet Take 1 tablet (1 mg total) by mouth 3 (three) times daily. 270 tablet 1   tiZANidine (ZANAFLEX) 4 MG tablet TAKE 1 TABLET BY MOUTH THREE TIMES A DAY AS NEEDED FOR MUSCLE SPASM 270 tablet 1   Vitamin D, Ergocalciferol, (DRISDOL) 1.25 MG (50000 UNIT) CAPS capsule Take 1 capsule (50,000 Units total) by mouth every 7 (seven) days. 7 capsule 12   No current facility-administered medications for this visit.    Allergies as of 11/01/2022 - Review Complete 11/01/2022  Allergen Reaction Noted   Nitrofurantoin  02/04/2009   Propoxyphene n-acetaminophen  02/04/2009   Nexium [esomeprazole magnesium] Nausea And Vomiting 08/29/2016    Nortriptyline hcl Nausea And Vomiting 08/29/2016   Hydrocodone bit-homatrop mbr Itching and Rash 09/01/2015   Morphine Itching and Rash     Family History  Problem Relation Age of Onset   Diabetes Mother    Stroke Mother        brother, sister, MGM   Colon polyps Mother    Heart disease Father    Kidney disease Father    Colon polyps Father    Lung disease Father    Kidney cancer Sister    Stroke Sister    Diabetes Sister    Colon polyps Sister    Celiac disease Other        niece   Heart attack Paternal Grandfather    Colon cancer Neg Hx    Esophageal cancer Neg Hx    Rectal cancer Neg Hx    Stomach cancer Neg Hx  Social History   Socioeconomic History   Marital status: Widowed    Spouse name: Not on file   Number of children: 1   Years of education: Not on file   Highest education level: Not on file  Occupational History   Occupation: Retired  Tobacco Use   Smoking status: Never   Smokeless tobacco: Never  Vaping Use   Vaping status: Never Used  Substance and Sexual Activity   Alcohol use: No   Drug use: No   Sexual activity: Not Currently  Other Topics Concern   Not on file  Social History Narrative   Lives with husband floyd   Drinks maybe 4 sodas a week   Social Determinants of Health   Financial Resource Strain: Low Risk  (06/28/2022)   Overall Financial Resource Strain (CARDIA)    Difficulty of Paying Living Expenses: Not hard at all  Food Insecurity: No Food Insecurity (06/28/2022)   Hunger Vital Sign    Worried About Running Out of Food in the Last Year: Never true    Ran Out of Food in the Last Year: Never true  Transportation Needs: No Transportation Needs (06/28/2022)   PRAPARE - Administrator, Civil Service (Medical): No    Lack of Transportation (Non-Medical): No  Physical Activity: Inactive (06/28/2022)   Exercise Vital Sign    Days of Exercise per Week: 0 days    Minutes of Exercise per Session: 0 min  Stress: No  Stress Concern Present (06/28/2022)   [employer] of Occupational Health - Occupational Stress Questionnaire    Feeling of Stress : Not at all  Social Connections: Moderately Integrated (06/28/2022)   Social Connection and Isolation Panel [NHANES]    Frequency of Communication with Friends and Family: More than three times a week    Frequency of Social Gatherings with Friends and Family: Three times a week    Attends Religious Services: More than 4 times per year    Active Member of Clubs or Organizations: Yes    Attends Banker Meetings: More than 4 times per year    Marital Status: Widowed  Intimate Partner Violence: Not At Risk (06/28/2022)   Humiliation, Afraid, Rape, and Kick questionnaire    Fear of Current or Ex-Partner: No    Emotionally Abused: No    Physically Abused: No    Sexually Abused: No    Review of Systems:    Constitutional: No weight loss, fever or chills Cardiovascular: No chest pain  Respiratory: No SOB  Gastrointestinal: See HPI and otherwise negative   Physical Exam:  Vital signs: BP (!) 148/80   Pulse 79   Ht 5\' 1"  (1.549 m)   Wt 193 lb 12.8 oz (87.9 kg)   BMI 36.62 kg/m    Constitutional:   Pleasant Elderly Caucasian female appears to be in NAD, Well developed, Well nourished, alert and cooperative Respiratory: Respirations even and unlabored. Lungs clear to auscultation bilaterally.   No wheezes, crackles, or rhonchi.  Cardiovascular: Normal S1, S2. No MRG. Regular rate and rhythm. No peripheral edema, cyanosis or pallor.  Gastrointestinal:  Soft, mild distention, mild generalized TTP. No rebound or guarding. Normal bowel sounds. No appreciable masses or hepatomegaly. Rectal:  Not performed.  Psychiatric: Demonstrates good judgement and reason without abnormal affect or behaviors.  RELEVANT LABS AND IMAGING: CBC    Component Value Date/Time   WBC 5.4 08/24/2022 0835   RBC 4.02 08/24/2022 0835   HGB 10.9 (L) 08/24/2022 0347  HCT 34.3 (L) 08/24/2022 0835   PLT 332.0 08/24/2022 0835   MCV 85.3 08/24/2022 0835   MCH 29.5 10/05/2017 1535   MCHC 31.7 08/24/2022 0835   RDW 14.6 08/24/2022 0835   LYMPHSABS 1.3 08/24/2022 0835   MONOABS 0.4 08/24/2022 0835   EOSABS 0.2 08/24/2022 0835   BASOSABS 0.1 08/24/2022 0835    CMP     Component Value Date/Time   NA 141 08/24/2022 0835   NA 141 10/11/2015 0000   K 4.2 08/24/2022 0835   CL 104 08/24/2022 0835   CO2 30 08/24/2022 0835   GLUCOSE 100 (H) 08/24/2022 0835   BUN 11 08/24/2022 0835   BUN 13 10/11/2015 0000   CREATININE 0.76 08/24/2022 0835   CREATININE 0.88 08/06/2017 1651   CALCIUM 9.1 08/24/2022 0835   PROT 6.5 08/24/2022 0835   ALBUMIN 3.7 08/24/2022 0835   AST 17 08/24/2022 0835   ALT 11 08/24/2022 0835   ALKPHOS 130 (H) 08/24/2022 0835   BILITOT 0.3 08/24/2022 0835   GFRNONAA >60 10/31/2021 1257   GFRAA >60 10/05/2017 1535    Assessment: 1.  Chronic constipation: Worsening constipation recently, apparently has been off of her MiraLAX, but a lifelong problem for her 2.  Epigastric discomfort/burping/eructations: I feel like this is likely related to the above 3.  GERD: Chronic for the patient, no true reflux or heartburn symptoms at the moment on Omeprazole 20 twice daily  Plan: 1.  Patient has had full workup for all of the symptoms.  Currently only using a stool softener and fiber supplement.  Recommend that she start MiraLAX once daily, titrated up to 4 times a day as needed.  Discussed that hopefully when she is able to have a more full bowel movement and that will help with all of her epigastric discomfort and burping/eructations which started after eating.  She would prefer to wait on starting a bunch of medications all at once so we will try treating the constipation first to see how she feels. 2.  If above is unsuccessful then would recommend Linzess at 145 mcg twice a day.  Discussed this with her.  She will call and let us know how she is  doing. 3.  Continue Omeprazole 20 mg twice daily for now.  If epigastric pain lingers after resolution or betterment of constipation then could consider increasing dosage. 4.  Discussed Naproxen and how this can be dangerous for her gut.  She verbalized understanding.  Recommend she titrate down if she is able. 5.  Patient to follow in clinic with me in 2 to 3 months.  She was assigned to Dr. Meridee Score this afternoon.  Hyacinth Meeker, PA-C [facility] 11/01/2022, 1:57 PM  Cc: Sheliah Hatch, MD

## 2022-11-01 NOTE — Patient Instructions (Signed)
Start Miralax daily.  If your blood pressure at your visit was 140/90 or greater, please contact your primary care physician to follow up on this.  _______________________________________________________  If you are age 76 or older, your body mass index should be between 23-30. Your Body mass index is 36.62 kg/m. If this is out of the aforementioned range listed, please consider follow up with your Primary Care Provider.  If you are age 53 or younger, your body mass index should be between 19-25. Your Body mass index is 36.62 kg/m. If this is out of the aformentioned range listed, please consider follow up with your Primary Care Provider.   ________________________________________________________  The [facility] providers would like to encourage you to use [facility] to communicate with providers for non-urgent requests or questions.  Due to long hold times on the telephone, sending your provider a message by [facility] may be a faster and more efficient way to get a response.  Please allow 48 business hours for a response.  Please remember that this is for non-urgent requests.   It was a pleasure to see you today!  Thank you for trusting me with your gastrointestinal care!    Hyacinth Meeker, PA-C

## 2022-11-02 NOTE — Progress Notes (Signed)
Attending Physician's Attestation   I have reviewed the chart.   I agree with the Advanced Practitioner's note, impression, and recommendations with any updates as below. Agree with workup as you have outlined.  With some of the burping and epigastric abdominal discomfort, it may be worth performing a Diatherix H. pylori (since it does not matter if she is on PPI or not for this stool test) just to confirm that H. pylori is not playing any role in her symptoms.   Corliss Parish, MD [facility]oscopy Office # [phone]

## 2022-11-08 DIAGNOSIS — Z4789 Encounter for other orthopedic aftercare: Secondary | ICD-10-CM | POA: Diagnosis not present

## 2022-11-08 DIAGNOSIS — Z96611 Presence of right artificial shoulder joint: Secondary | ICD-10-CM | POA: Diagnosis not present

## 2022-11-08 NOTE — Progress Notes (Deleted)
PATIENT: Andrea Byrd DOB: 27-Jun-1945  REASON FOR VISIT: follow up HISTORY FROM: patient  No chief complaint on file.    HISTORY OF PRESENT ILLNESS:  11/08/22 ALL: [name] returns for follow up for neuropathy and RLS. We added pramipexole XR at last visit but was unable to afford so we switched to pramipexole IR 1mg  TID. Since,   05/04/2022 ALL: [name] returns for follow up for neuropathy and RLS. She was last seen 10/2021. We resent rx for neuropathy cream and continued pramipexole. Duloxetine was switched to fluoxetine per PCP. Since, she has not noted any significant benefit from neuropathy cream. Neuropathy and RLS unchanged. She feels that her legs never really rest. She is taking pramipexole 2mg  at bedtime that gives some relief. She is tolerating well. She is s/p right foot surgery with another planned next week. She has had Covid twice since we last met. She is adjusting to changes following Mr Hanford's passing. She continues PT.   10/26/2021 ALL: [name] returns for follow up. She was last seen 04/2021 and neuropathy was worsening. We continued pramipexole 1mg  BID and started a compounded neuropathy cream. She has continued pramipexole but reports accidentally cancelling rx for neuropathy cream. She had mistaken the pharmacist for her local pharmacist about another medication. She lost her husband in March. She reports neuropathy continues to worsen. She is having numbness/tingling/cold sensation of both feet. Occasional shooting pains. She does feel gait is off at times. She is scheduled for right bunion correction, 2nd hammer toe, 2nd metatarsal weil and tendon lengthening in two days. She feels this may be contributing to imbalance. She anticipates PT following surgery.   05/02/2021 ALL: [name] returns for follow up for RLS and dysesthesias. She was last seen 10/2019. She continues pramipexole 1mg  BID. Gabapentin was discontinued by PCP about 2-3 months ago. She did not feel it was helping. Now  on duloxetine 20mg  daily. 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 She continues to have numbness/tingling and describes a cold sensation of both feet. No changes in gait. She did have a fall a couple of months ago after her sock got stuck on glue that was dropped on the floor. She reports not being able to feel the hot glue through her sock. She continues to provide total care for her husband who is now bed bound. He is now being seen by hospice. She has an aide that helps for a few hours three times a week.   10/08/2019 ALL:  [name] is a 77 y.o. female here today for follow up for RLS and neuropathic pain of both feet. She continues gabapentin 600mg  BID and pramipexole 1mg  BID. She reports symptoms are about the same. She does have sharp shooting pains of both feel regularly. She does not wish to change treatment plan at this time. She is selling her home and building a smaller home. She continues to provide care to her disabled husband. Her sister passed away last month. She is seen regularly by PCP who currently refills all medications.   HISTORY: (copied from my note on 10/07/2018)  [name] is a 77 y.o. female here today for follow up for restless legs. She continues pramipexole 1mg  BID and gabapentin 600mg  BID. She does continue to have aching in bilateral legs at night. She feels that this is manageable and does not wish to increase/add medications. She stays busy as caregiver for her husband  post CVA and her sister who recently fell ill. She is tolerating medications well with no adverse effects noted.    HISTORY: (copied from Dr Richrd Humbles note on 10/02/2017)   UPDATE (10/02/17, VRP): Since last visit, doing well. Symptoms are mild. Severity is mild. No alleviating or aggravating factors. Tolerating meds. (on side note, pt husband is in hospital with stroke, and planning to be d/c'd today).    UPDATE 08/29/16: Since last visit, RLS stable  on gabapentin and pramipexole. Sharp pains in toes and feet slightly worse.    UPDATE 09/01/15: Since last visit, doing well. On gabapentin 600mg  BID + pramipexole 1mg  BID. Sxs well controlled. No side effects.    UPDATE 08/25/14: Since last visit, doing well. Has had chiro tx for back issues and feels better. On gabapentin 300mg  TID + pramipex 1mg  qhs. Satisfied with current dosing.    UPDATE 04/17/14: Since last visit, symptoms have continued. Numbness in feet, restless legs at night. On gabapentin 300mg  TID. PCP also tried her on pramipexole 0.25mg x3 at bedtime.    UPDATE 08/13/12: 77 year old right-handed female with history of hypertension, restless leg syndrome, here for evaluation of progressive numbness and tingling in her toes and feet. I previously saw this patient in October 2013 as referred by primary care physician. Now patient is referred to me by orthopedic surgery. Since last visit patient's symptoms have progressed. Now she has numbness and tingling in her bilateral feet up to her ankles. She has intermittent sharp sensations as well. Symptoms are fairly symmetric. No symptoms in her upper legs, lower back, torso, arms or neck.   PRIOR HPI (12/18/11): 77 year old right-handed female with history of hypertension, this is a syndrome, here for evaluation of numbness in her feet for the past 3 months. Patient describes numbness and tingling in her toes and bottom of her feet. No significant pain. She also feels coldness in her feet. No back pain or symptoms radiating from her back to her feet. No symptoms in her hands or fingers. No neck pain. Symptoms are mild and gradually progressing. 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 She did not have any numbness in the symptoms at that time.     REVIEW OF SYSTEMS: Out of a complete 14 system review of symptoms, the patient complains only of the  following symptoms, restless legs, dysesthesias and all other reviewed systems are negative.   ALLERGIES: Allergies  Allergen Reactions   Nitrofurantoin     Makes mouth break out/blisters   Propoxyphene N-Acetaminophen     REACTION: nausea   Nexium [Esomeprazole Magnesium] Nausea And Vomiting   Nortriptyline Hcl Nausea And Vomiting   Hydrocodone Bit-Homatrop Mbr Itching and Rash   Morphine Itching and Rash    HOME MEDICATIONS: Outpatient Medications Prior to Visit  Medication Sig Dispense Refill   dicyclomine (BENTYL) 10 MG capsule Take 1 capsule (10 mg total) by mouth every 4 (four) hours as needed for spasms. 30 capsule 5   FLUoxetine (PROZAC) 20 MG capsule Take 1 capsule (20 mg total) by mouth daily. 90 capsule 1   furosemide (LASIX) 20 MG tablet Take 1 tablet (20 mg total) by mouth daily. 90 tablet 1   latanoprost (XALATAN) 0.005 % ophthalmic solution Place 1 drop into both eyes at bedtime.      losartan-hydrochlorothiazide (HYZAAR) 50-12.5 MG tablet TAKE 1 TABLET BY MOUTH EVERY DAY 90 tablet 1   omeprazole (PRILOSEC) 20 MG  capsule TAKE 1 CAPSULE 2 TIMES DAILY 30-60 MINUTES BEFORE BREAKFAST AND DINNER. 180 capsule 1   potassium chloride SA (KLOR-CON M) 20 MEQ tablet Take 1 tablet (20 mEq total) by mouth daily. 90 tablet 1   pramipexole (MIRAPEX) 1 MG tablet Take 1 tablet (1 mg total) by mouth 3 (three) times daily. 270 tablet 1   tiZANidine (ZANAFLEX) 4 MG tablet TAKE 1 TABLET BY MOUTH THREE TIMES A DAY AS NEEDED FOR MUSCLE SPASM 270 tablet 1   Vitamin D, Ergocalciferol, (DRISDOL) 1.25 MG (50000 UNIT) CAPS capsule Take 1 capsule (50,000 Units total) by mouth every 7 (seven) days. 7 capsule 12   No facility-administered medications prior to visit.    PAST MEDICAL HISTORY: Past Medical History:  Diagnosis Date   Anemia    Arthritis    Cervical cancer (HCC) 1977   Diverticulosis    Endometriosis    Esophageal stricture    Gastritis    GERD (gastroesophageal reflux disease)     Glaucoma    Herpes simplex type 1 infection    Hiatal hernia    History of colon polyps    History of gallstones    Hypertension    IBS (irritable bowel syndrome)    Insomnia    Post-operative nausea and vomiting    Restless leg syndrome     PAST SURGICAL HISTORY: Past Surgical History:  Procedure Laterality Date   ABDOMINAL HYSTERECTOMY  1998   APPENDECTOMY  1998   BUNIONECTOMY WITH HAMMERTOE RECONSTRUCTION Right 11/03/2021   Procedure: Right bunion correction with double osteotomy; Second Metatarsal weil; Second hammertoe correction; third through fifth tendon lengthening; excision of bunionette;  Surgeon: Toni Arthurs, MD;  Location: [facility];  Service: Orthopedics;  Laterality: Right;   CERVICAL CONE BIOPSY     CHOLECYSTECTOMY  1980   COLON RESECTION  2002   COLON SURGERY  2003   removed 2 feet colon   ESOPHAGEAL MANOMETRY N/A 04/24/2016   Procedure: ESOPHAGEAL MANOMETRY (EM);  Surgeon: Napoleon Form, MD;  Location: [facility];  Service: Endoscopy;  Laterality: N/A;   HAND SURGERY     right/ thumb joints replaced both hands   OOPHORECTOMY     right foot surgery Right 08/15/2022   SHOULDER ARTHROSCOPY  02/28/2022   TOTAL SHOULDER REPLACEMENT Right 06/19/2022   TUBAL LIGATION      FAMILY HISTORY: Family History  Problem Relation Age of Onset   Diabetes Mother    Stroke Mother        brother, sister, MGM   Colon polyps Mother    Heart disease Father    Kidney disease Father    Colon polyps Father    Lung disease Father    Kidney cancer Sister    Stroke Sister    Diabetes Sister    Colon polyps Sister    Celiac disease Other        niece   Heart attack Paternal Grandfather    Colon cancer Neg Hx    Esophageal cancer Neg Hx    Rectal cancer Neg Hx    Stomach cancer Neg Hx     SOCIAL HISTORY: Social History   Socioeconomic History   Marital status: Widowed    Spouse name: Not on file   Number of children: 1   Years of  education: Not on file   Highest education level: Not on file  Occupational History   Occupation: Retired  Tobacco Use   Smoking status: Never   Smokeless  tobacco: Never  Vaping Use   Vaping status: Never Used  Substance and Sexual Activity   Alcohol use: No   Drug use: No   Sexual activity: Not Currently  Other Topics Concern   Not on file  Social History Narrative   Lives with husband floyd   Drinks maybe 4 sodas a week   Social Determinants of Health   Financial Resource Strain: Low Risk  (06/28/2022)   Overall Financial Resource Strain (CARDIA)    Difficulty of Paying Living Expenses: Not hard at all  Food Insecurity: No Food Insecurity (06/28/2022)   Hunger Vital Sign    Worried About Running Out of Food in the Last Year: Never true    Ran Out of Food in the Last Year: Never true  Transportation Needs: No Transportation Needs (06/28/2022)   PRAPARE - Administrator, Civil Service (Medical): No    Lack of Transportation (Non-Medical): No  Physical Activity: Inactive (06/28/2022)   Exercise Vital Sign    Days of Exercise per Week: 0 days    Minutes of Exercise per Session: 0 min  Stress: No Stress Concern Present (06/28/2022)   [employer] of Occupational Health - Occupational Stress Questionnaire    Feeling of Stress : Not at all  Social Connections: Moderately Integrated (06/28/2022)   Social Connection and Isolation Panel [NHANES]    Frequency of Communication with Friends and Family: More than three times a week    Frequency of Social Gatherings with Friends and Family: Three times a week    Attends Religious Services: More than 4 times per year    Active Member of Clubs or Organizations: Yes    Attends Banker Meetings: More than 4 times per year    Marital Status: Widowed  Intimate Partner Violence: Not At Risk (06/28/2022)   Humiliation, Afraid, Rape, and Kick questionnaire    Fear of Current or Ex-Partner: No    Emotionally Abused:  No    Physically Abused: No    Sexually Abused: No      PHYSICAL EXAM  There were no vitals filed for this visit.     There is no height or weight on file to calculate BMI.  Generalized: Well developed, in no acute distress  Cardiology: normal rate and rhythm, no murmur noted Respiratory: clear to auscultation bilaterally  Neurological examination  Mentation: Alert oriented to time, place, history taking. Follows all commands speech and language fluent Cranial nerve II-XII: Pupils were equal round reactive to light. Extraocular movements were full, visual field were full on confrontational test. Facial sensation and strength were normal. Uvula tongue midline. Head turning and shoulder shrug  were normal and symmetric. Motor: The motor testing reveals 5 over 5 strength of all 4 extremities. Good symmetric motor tone is noted throughout.  Sensory: Sensory testing is intact to soft touch on all 4 extremities with exception of decreased sensation to all toes bilaterally. No evidence of extinction is noted.  Coordination: Cerebellar testing reveals good finger-nose-finger and heel-to-shin bilaterally.  Gait and station: Gait is normal.    DIAGNOSTIC DATA (LABS, IMAGING, TESTING) - I reviewed patient records, labs, notes, testing and imaging myself where available.     09/26/2017    1:26 PM  MMSE - Mini Mental State Exam  Orientation to time 5  Orientation to Place 5  Registration 3  Attention/ Calculation 5  Recall 3  Language- name 2 objects 2  Language- repeat 1  Language- follow 3 step  command 3  Language- read & follow direction 1  Write a sentence 1  Copy design 1  Total score 30     Lab Results  Component Value Date   WBC 5.4 08/24/2022   HGB 10.9 (L) 08/24/2022   HCT 34.3 (L) 08/24/2022   MCV 85.3 08/24/2022   PLT 332.0 08/24/2022      Component Value Date/Time   NA 141 08/24/2022 0835   NA 141 10/11/2015 0000   K 4.2 08/24/2022 0835   CL 104 08/24/2022  0835   CO2 30 08/24/2022 0835   GLUCOSE 100 (H) 08/24/2022 0835   BUN 11 08/24/2022 0835   BUN 13 10/11/2015 0000   CREATININE 0.76 08/24/2022 0835   CREATININE 0.88 08/06/2017 1651   CALCIUM 9.1 08/24/2022 0835   PROT 6.5 08/24/2022 0835   ALBUMIN 3.7 08/24/2022 0835   AST 17 08/24/2022 0835   ALT 11 08/24/2022 0835   ALKPHOS 130 (H) 08/24/2022 0835   BILITOT 0.3 08/24/2022 0835   GFRNONAA >60 10/31/2021 1257   GFRAA >60 10/05/2017 1535   Lab Results  Component Value Date   CHOL 149 08/24/2022   HDL 42.90 08/24/2022   LDLCALC 89 08/24/2022   TRIG 83.0 08/24/2022   CHOLHDL 3 08/24/2022   Lab Results  Component Value Date   HGBA1C 5.6 08/19/2021   No results found for: "VITAMINB12" Lab Results  Component Value Date   TSH 1.58 08/24/2022      ASSESSMENT AND PLAN 77 y.o. year old female  has a past medical history of Anemia, Arthritis, Cervical cancer (HCC) (1977), Diverticulosis, Endometriosis, Esophageal stricture, Gastritis, GERD (gastroesophageal reflux disease), Glaucoma, Herpes simplex type 1 infection, Hiatal hernia, History of colon polyps, History of gallstones, Hypertension, IBS (irritable bowel syndrome), Insomnia, Post-operative nausea and vomiting, and Restless leg syndrome. here with   No diagnosis found.   Orlean reports that neuropathy and RLS are unchanged from previous visit. Neuropathy cream was not beneficial. She feels that her legs never rest. I will have her continue pramipexole IR 1-2mg  at bedtime and we will add an extended release dose of 1.5mg  daily. Will consider adding agents for neuropathy pain in future if she wishes. She was encouraged to continue focusing on healthy lifestyle habits. She will follow up with me in 6 months. May follow up in 1 year if stable. She verbalizes understanding and agreement with this plan.    No orders of the defined types were placed in this encounter.    No orders of the defined types were placed in this  encounter.     Shawnie Dapper, FNP-C 11/08/2022, 4:30 PM [facility] [address], [address] [place], [place] [address] [phone]

## 2022-11-09 ENCOUNTER — Telehealth: Payer: Self-pay | Admitting: Family Medicine

## 2022-11-09 ENCOUNTER — Ambulatory Visit: Payer: Medicare HMO | Admitting: Family Medicine

## 2022-11-09 DIAGNOSIS — G2581 Restless legs syndrome: Secondary | ICD-10-CM

## 2022-11-09 DIAGNOSIS — R208 Other disturbances of skin sensation: Secondary | ICD-10-CM

## 2022-11-09 NOTE — Telephone Encounter (Signed)
Pt would like a call from the nurse to schedule an earlier appt. Pt stated, cannot do an early morning appt.

## 2022-11-09 NOTE — Telephone Encounter (Signed)
LVM and sent mychart msg informing pt of need to reschedule 11/09/22 appt - NP out

## 2022-11-10 ENCOUNTER — Other Ambulatory Visit: Payer: Self-pay | Admitting: Family Medicine

## 2022-11-10 DIAGNOSIS — Z4789 Encounter for other orthopedic aftercare: Secondary | ICD-10-CM | POA: Diagnosis not present

## 2022-11-10 DIAGNOSIS — Z96611 Presence of right artificial shoulder joint: Secondary | ICD-10-CM | POA: Diagnosis not present

## 2022-11-10 NOTE — Telephone Encounter (Signed)
Pt is asking for a refill on Pramipexole but [name] [name],MD refilled the last time . Would you like to refill ?

## 2022-11-10 NOTE — Telephone Encounter (Signed)
Pt was called back, a brief vm was left providing hours phone staff would be available today to assist in rescheduling .  Hours for Mon-Thurs were stated as well as office#

## 2022-11-14 DIAGNOSIS — Z4789 Encounter for other orthopedic aftercare: Secondary | ICD-10-CM | POA: Diagnosis not present

## 2022-11-14 DIAGNOSIS — Z96611 Presence of right artificial shoulder joint: Secondary | ICD-10-CM | POA: Diagnosis not present

## 2022-11-17 DIAGNOSIS — Z4789 Encounter for other orthopedic aftercare: Secondary | ICD-10-CM | POA: Diagnosis not present

## 2022-11-17 DIAGNOSIS — M25511 Pain in right shoulder: Secondary | ICD-10-CM | POA: Diagnosis not present

## 2022-11-17 DIAGNOSIS — Z96611 Presence of right artificial shoulder joint: Secondary | ICD-10-CM | POA: Diagnosis not present

## 2022-11-20 DIAGNOSIS — S92411K Displaced fracture of proximal phalanx of right great toe, subsequent encounter for fracture with nonunion: Secondary | ICD-10-CM | POA: Diagnosis not present

## 2022-11-20 DIAGNOSIS — T8484XA Pain due to internal orthopedic prosthetic devices, implants and grafts, initial encounter: Secondary | ICD-10-CM | POA: Diagnosis not present

## 2022-11-20 DIAGNOSIS — Z4889 Encounter for other specified surgical aftercare: Secondary | ICD-10-CM | POA: Diagnosis not present

## 2022-11-21 DIAGNOSIS — Z96611 Presence of right artificial shoulder joint: Secondary | ICD-10-CM | POA: Diagnosis not present

## 2022-11-21 DIAGNOSIS — Z4789 Encounter for other orthopedic aftercare: Secondary | ICD-10-CM | POA: Diagnosis not present

## 2022-11-24 DIAGNOSIS — Z4789 Encounter for other orthopedic aftercare: Secondary | ICD-10-CM | POA: Diagnosis not present

## 2022-11-24 DIAGNOSIS — Z96611 Presence of right artificial shoulder joint: Secondary | ICD-10-CM | POA: Diagnosis not present

## 2022-11-27 NOTE — Telephone Encounter (Signed)
Pt reschedule cancelled appt due to NP out. Reschedule 06/14/22, added patient to the waitlist.

## 2022-11-28 DIAGNOSIS — Z4789 Encounter for other orthopedic aftercare: Secondary | ICD-10-CM | POA: Diagnosis not present

## 2022-11-28 DIAGNOSIS — Z96611 Presence of right artificial shoulder joint: Secondary | ICD-10-CM | POA: Diagnosis not present

## 2022-11-30 ENCOUNTER — Ambulatory Visit: Payer: Medicare HMO | Admitting: Family Medicine

## 2022-12-01 DIAGNOSIS — Z96611 Presence of right artificial shoulder joint: Secondary | ICD-10-CM | POA: Diagnosis not present

## 2022-12-01 DIAGNOSIS — Z4789 Encounter for other orthopedic aftercare: Secondary | ICD-10-CM | POA: Diagnosis not present

## 2022-12-02 ENCOUNTER — Other Ambulatory Visit: Payer: Self-pay | Admitting: Family Medicine

## 2022-12-02 DIAGNOSIS — E876 Hypokalemia: Secondary | ICD-10-CM

## 2022-12-05 DIAGNOSIS — Z96611 Presence of right artificial shoulder joint: Secondary | ICD-10-CM | POA: Diagnosis not present

## 2022-12-05 DIAGNOSIS — Z4789 Encounter for other orthopedic aftercare: Secondary | ICD-10-CM | POA: Diagnosis not present

## 2022-12-08 DIAGNOSIS — Z96611 Presence of right artificial shoulder joint: Secondary | ICD-10-CM | POA: Diagnosis not present

## 2022-12-08 DIAGNOSIS — Z4789 Encounter for other orthopedic aftercare: Secondary | ICD-10-CM | POA: Diagnosis not present

## 2022-12-12 DIAGNOSIS — Z4789 Encounter for other orthopedic aftercare: Secondary | ICD-10-CM | POA: Diagnosis not present

## 2022-12-12 DIAGNOSIS — Z96611 Presence of right artificial shoulder joint: Secondary | ICD-10-CM | POA: Diagnosis not present

## 2022-12-15 DIAGNOSIS — Z4789 Encounter for other orthopedic aftercare: Secondary | ICD-10-CM | POA: Diagnosis not present

## 2022-12-15 DIAGNOSIS — Z96611 Presence of right artificial shoulder joint: Secondary | ICD-10-CM | POA: Diagnosis not present

## 2022-12-18 DIAGNOSIS — Z96611 Presence of right artificial shoulder joint: Secondary | ICD-10-CM | POA: Diagnosis not present

## 2022-12-19 DIAGNOSIS — Z4789 Encounter for other orthopedic aftercare: Secondary | ICD-10-CM | POA: Diagnosis not present

## 2022-12-19 DIAGNOSIS — Z96611 Presence of right artificial shoulder joint: Secondary | ICD-10-CM | POA: Diagnosis not present

## 2022-12-26 ENCOUNTER — Telehealth: Payer: Self-pay | Admitting: Physician Assistant

## 2022-12-26 NOTE — Telephone Encounter (Signed)
Inbound call from patient, would like to speak to speak with a nurse, states she is throwing up blood. Please advise.

## 2022-12-26 NOTE — Telephone Encounter (Signed)
Pt states she vomited a lot last night and the last time she did she saw a little blood. She feels she may have torn something. She has only had a little to drink today but has not gotten sick again. Discussed with her that if she sees blood like that again she would need to be seen in the ER. Also discussed with her if she is not able to drink fluids and stay hydrated she should go to urgent care or the ER.

## 2023-01-03 DIAGNOSIS — Z96611 Presence of right artificial shoulder joint: Secondary | ICD-10-CM | POA: Diagnosis not present

## 2023-01-03 DIAGNOSIS — Z4789 Encounter for other orthopedic aftercare: Secondary | ICD-10-CM | POA: Diagnosis not present

## 2023-01-05 DIAGNOSIS — Z96611 Presence of right artificial shoulder joint: Secondary | ICD-10-CM | POA: Diagnosis not present

## 2023-01-05 DIAGNOSIS — Z4789 Encounter for other orthopedic aftercare: Secondary | ICD-10-CM | POA: Diagnosis not present

## 2023-01-09 DIAGNOSIS — Z4789 Encounter for other orthopedic aftercare: Secondary | ICD-10-CM | POA: Diagnosis not present

## 2023-01-09 DIAGNOSIS — Z96611 Presence of right artificial shoulder joint: Secondary | ICD-10-CM | POA: Diagnosis not present

## 2023-01-11 ENCOUNTER — Ambulatory Visit: Payer: Medicare HMO | Admitting: Physician Assistant

## 2023-01-11 ENCOUNTER — Encounter: Payer: Self-pay | Admitting: Physician Assistant

## 2023-01-11 VITALS — BP 140/80 | HR 98 | Ht 62.0 in | Wt 192.6 lb

## 2023-01-11 DIAGNOSIS — R142 Eructation: Secondary | ICD-10-CM

## 2023-01-11 DIAGNOSIS — K2100 Gastro-esophageal reflux disease with esophagitis, without bleeding: Secondary | ICD-10-CM

## 2023-01-11 DIAGNOSIS — K5900 Constipation, unspecified: Secondary | ICD-10-CM | POA: Diagnosis not present

## 2023-01-11 NOTE — Progress Notes (Signed)
Attending Physician's Attestation   I have reviewed the chart.   I agree with the Advanced Practitioner's note, impression, and recommendations with any updates as below.    Emersyn Wyss Mansouraty, MD [facility] Office # [phone]  

## 2023-01-11 NOTE — Progress Notes (Signed)
Chief Complaint: Hematemesis  HPI:    Andrea Byrd is a 77 year old female with a past medical history as listed below including reflux, IBS and multiple others, assigned to Dr. Meridee Score, who presents to clinic today for hematemesis.    05/09/2022 patient seen in clinic by Dr. Orvan Falconer and at that time following up for dysphagia as well as chronic constipation and history of polyps. At that point increase Omeprazole to 40 mg twice a day and referral to healthy weight and wellness. Discussed barium esophagram if dysphagia did not improve.     11/01/2022 patient seen in clinic for reflux, burping, constipation and epigastric pain.  At that time discussed she had had a full workup of her symptoms.  Recommended that she start MiraLAX once a day with the stool softener and fiber supplement she is already taking.  Discussed possible Linzess 145 mcg daily if this was not successful.  Continued on Omeprazole 20 twice daily.  Recommend she titrate down on Naproxen.    12/26/2022 patient called in and describes she had vomited a lot the night before and saw a little blood.  At that time recommended to go to the ER if it continued.    Today, the patient tells me that she is doing okay.  She is having a lot of allover joint pain and tells me she has a history of arthritis.  Apparently following with her PCP and another month or so.  As far as her GI symptoms her constipation has been helped by MiraLAX daily but she remains with eructations and some indigestion issues regardless of her Omeprazole 20 twice daily.    Currently reading the new [employer] book.    Denies fever, chills or weight loss.  Reviewed GI workup: 02/09/2012 colonoscopy with diverticulosis in sigmoid anastomosis, unable to retroflex in the rectum due to small rectal vault, repeat recommended 10 years.    08/21/2016 EGD with LA grade C esophagitis, esophageal stricture dilated with 20 mm TTS balloon and gastritis.    08/25/2019 office visit  with Dr. Lavon Paganini.  At that time was there for follow-up of GERD and right lower quadrant abdominal pain.  Continued with some persistent lower abdominal pain.  Discussed CT with moderate hiatal hernia, sigmoid diverticulosis and multilevel degenerative changes in the lumbar spine.   01/19/22 office visit with Hyacinth Meeker, PA for solid food dysphagia without heartburn or reflux symptoms.    02/16/22: EGD with empiric balloon dilation to 18mm with minimal resistance to a fully inflated balloon, medium-sized hiatal hernia. Esophageal biopsies showed reflux esophagitis.    02/16/22: Colonoscopy showed pancolonic diverticulosis, a 5mm transverse inflammatory polyp, and 2 small right sided tubular adenomas. Surveillance colonoscopy not recommended due to advanced age.   Past Medical History:  Diagnosis Date   Anemia    Arthritis    Cervical cancer (HCC) 1977   Diverticulosis    Endometriosis    Esophageal stricture    Gastritis    GERD (gastroesophageal reflux disease)    Glaucoma    Herpes simplex type 1 infection    Hiatal hernia    History of colon polyps    History of gallstones    Hypertension    IBS (irritable bowel syndrome)    Insomnia    Post-operative nausea and vomiting    Restless leg syndrome     Past Surgical History:  Procedure Laterality Date   ABDOMINAL HYSTERECTOMY  1998   APPENDECTOMY  1998   BUNIONECTOMY WITH HAMMERTOE RECONSTRUCTION Right  11/03/2021   Procedure: Right bunion correction with double osteotomy; Second Metatarsal weil; Second hammertoe correction; third through fifth tendon lengthening; excision of bunionette;  Surgeon: Toni Arthurs, MD;  Location: [facility];  Service: Orthopedics;  Laterality: Right;   CERVICAL CONE BIOPSY     CHOLECYSTECTOMY  1980   COLON RESECTION  2002   COLON SURGERY  2003   removed 2 feet colon   ESOPHAGEAL MANOMETRY N/A 04/24/2016   Procedure: ESOPHAGEAL MANOMETRY (EM);  Surgeon: Napoleon Form, MD;   Location: [facility];  Service: Endoscopy;  Laterality: N/A;   HAND SURGERY     right/ thumb joints replaced both hands   OOPHORECTOMY     right foot surgery Right 08/15/2022   SHOULDER ARTHROSCOPY  02/28/2022   TOTAL SHOULDER REPLACEMENT Right 06/19/2022   TUBAL LIGATION      Current Outpatient Medications  Medication Sig Dispense Refill   dicyclomine (BENTYL) 10 MG capsule Take 1 capsule (10 mg total) by mouth every 4 (four) hours as needed for spasms. 30 capsule 5   FLUoxetine (PROZAC) 20 MG capsule Take 1 capsule (20 mg total) by mouth daily. 90 capsule 1   furosemide (LASIX) 20 MG tablet Take 1 tablet (20 mg total) by mouth daily. 90 tablet 1   KLOR-CON M20 20 MEQ tablet TAKE 1 TABLET BY MOUTH EVERY DAY 90 tablet 1   latanoprost (XALATAN) 0.005 % ophthalmic solution Place 1 drop into both eyes at bedtime.      losartan-hydrochlorothiazide (HYZAAR) 50-12.5 MG tablet TAKE 1 TABLET BY MOUTH EVERY DAY 90 tablet 1   omeprazole (PRILOSEC) 20 MG capsule TAKE 1 CAPSULE 2 TIMES DAILY 30-60 MINUTES BEFORE BREAKFAST AND DINNER. 180 capsule 1   pramipexole (MIRAPEX) 1 MG tablet TAKE 1 TABLET BY MOUTH TWICE A DAY 180 tablet 1   tiZANidine (ZANAFLEX) 4 MG tablet TAKE 1 TABLET BY MOUTH THREE TIMES A DAY AS NEEDED FOR MUSCLE SPASM 270 tablet 1   No current facility-administered medications for this visit.    Allergies as of 01/11/2023 - Review Complete 01/11/2023  Allergen Reaction Noted   Nitrofurantoin  02/04/2009   Propoxyphene n-acetaminophen  02/04/2009   Nexium [esomeprazole magnesium] Nausea And Vomiting 08/29/2016   Nortriptyline hcl Nausea And Vomiting 08/29/2016   Hydrocodone bit-homatrop mbr Itching and Rash 09/01/2015   Morphine Itching and Rash     Family History  Problem Relation Age of Onset   Diabetes Mother    Stroke Mother        brother, sister, MGM   Colon polyps Mother    Heart disease Father    Kidney disease Father    Colon polyps Father    Lung disease  Father    Kidney cancer Sister    Stroke Sister    Diabetes Sister    Colon polyps Sister    Celiac disease Other        niece   Heart attack Paternal Grandfather    Colon cancer Neg Hx    Esophageal cancer Neg Hx    Rectal cancer Neg Hx    Stomach cancer Neg Hx     Social History   Socioeconomic History   Marital status: Widowed    Spouse name: Not on file   Number of children: 1   Years of education: Not on file   Highest education level: Not on file  Occupational History   Occupation: Retired  Tobacco Use   Smoking status: Never   Smokeless tobacco: Never  Vaping Use   Vaping status: Never Used  Substance and Sexual Activity   Alcohol use: No   Drug use: No   Sexual activity: Not Currently  Other Topics Concern   Not on file  Social History Narrative   Lives with husband floyd   Drinks maybe 4 sodas a week   Social Determinants of Health   Financial Resource Strain: Low Risk  (06/28/2022)   Overall Financial Resource Strain (CARDIA)    Difficulty of Paying Living Expenses: Not hard at all  Food Insecurity: No Food Insecurity (06/28/2022)   Hunger Vital Sign    Worried About Running Out of Food in the Last Year: Never true    Ran Out of Food in the Last Year: Never true  Transportation Needs: No Transportation Needs (06/28/2022)   PRAPARE - Administrator, Civil Service (Medical): No    Lack of Transportation (Non-Medical): No  Physical Activity: Inactive (06/28/2022)   Exercise Vital Sign    Days of Exercise per Week: 0 days    Minutes of Exercise per Session: 0 min  Stress: No Stress Concern Present (06/28/2022)   [employer] of Occupational Health - Occupational Stress Questionnaire    Feeling of Stress : Not at all  Social Connections: Moderately Integrated (06/28/2022)   Social Connection and Isolation Panel [NHANES]    Frequency of Communication with Friends and Family: More than three times a week    Frequency of Social Gatherings with  Friends and Family: Three times a week    Attends Religious Services: More than 4 times per year    Active Member of Clubs or Organizations: Yes    Attends Banker Meetings: More than 4 times per year    Marital Status: Widowed  Intimate Partner Violence: Not At Risk (06/28/2022)   Humiliation, Afraid, Rape, and Kick questionnaire    Fear of Current or Ex-Partner: No    Emotionally Abused: No    Physically Abused: No    Sexually Abused: No    Review of Systems:    Constitutional: No weight loss, fever or chills Cardiovascular: No chest pain  Respiratory: No SOB Gastrointestinal: See HPI and otherwise negative   Physical Exam:  Vital signs: BP (!) 140/80   Pulse 98   Ht 5\' 2"  (1.575 m)   Wt 192 lb 9.6 oz (87.4 kg)   BMI 35.23 kg/m    Constitutional:   Pleasant elderly Caucasian female appears to be in NAD, Well developed, Well nourished, alert and cooperative Respiratory: Respirations even and unlabored. Lungs clear to auscultation bilaterally.   No wheezes, crackles, or rhonchi.  Cardiovascular: Normal S1, S2. No MRG. Regular rate and rhythm. No peripheral edema, cyanosis or pallor.  Gastrointestinal:  Soft, nondistended, nontender. No rebound or guarding. Normal bowel sounds. No appreciable masses or hepatomegaly. Rectal:  Not performed.  Psychiatric: Oriented to person, place and time. Demonstrates good judgement and reason without abnormal affect or behaviors.  RELEVANT LABS AND IMAGING: CBC    Component Value Date/Time   WBC 5.4 08/24/2022 0835   RBC 4.02 08/24/2022 0835   HGB 10.9 (L) 08/24/2022 0835   HCT 34.3 (L) 08/24/2022 0835   PLT 332.0 08/24/2022 0835   MCV 85.3 08/24/2022 0835   MCH 29.5 10/05/2017 1535   MCHC 31.7 08/24/2022 0835   RDW 14.6 08/24/2022 0835   LYMPHSABS 1.3 08/24/2022 0835   MONOABS 0.4 08/24/2022 0835   EOSABS 0.2 08/24/2022 0835   BASOSABS 0.1 08/24/2022 0835  CMP     Component Value Date/Time   NA 141 08/24/2022  0835   NA 141 10/11/2015 0000   K 4.2 08/24/2022 0835   CL 104 08/24/2022 0835   CO2 30 08/24/2022 0835   GLUCOSE 100 (H) 08/24/2022 0835   BUN 11 08/24/2022 0835   BUN 13 10/11/2015 0000   CREATININE 0.76 08/24/2022 0835   CREATININE 0.88 08/06/2017 1651   CALCIUM 9.1 08/24/2022 0835   PROT 6.5 08/24/2022 0835   ALBUMIN 3.7 08/24/2022 0835   AST 17 08/24/2022 0835   ALT 11 08/24/2022 0835   ALKPHOS 130 (H) 08/24/2022 0835   BILITOT 0.3 08/24/2022 0835   GFRNONAA >60 10/31/2021 1257   GFRAA >60 10/05/2017 1535    Assessment: 1.  Eructation/GERD: Continues regardless of Omeprazole 20 twice daily and correction of constipation; consider ongoing gastritis 2.  Chronic constipation: Better with stool softener, fiber and MiraLAX  Plan: 1.  Continue with MiraLAX. 2.  Discussed eructations.  Will trial an increase in her PPI, increased her to Omeprazole 40 mg twice daily, 30-60 minutes before breakfast and dinner.  Patient tells me she has a lot of Omeprazole 20 mg and would like to just use to 20 mg tabs twice a day for a while.  She should do this for the next 2 to 3 months to see if it is helpful. 3.  Scheduled the patient to follow-up with me in 3 months.  If she is doing better she can cancel.  Hyacinth Meeker, PA-C  Gastroenterology 01/11/2023, 2:54 PM  Cc: Sheliah Hatch, MD

## 2023-01-11 NOTE — Patient Instructions (Addendum)
We are upping your omeprazole 20 mg 2x daily   Continue miralax   _______________________________________________________  If your blood pressure at your visit was 140/90 or greater, please contact your primary care physician to follow up on this.  _______________________________________________________  If you are age 76 or older, your body mass index should be between 23-30. Your Body mass index is 35.23 kg/m. If this is out of the aforementioned range listed, please consider follow up with your Primary Care Provider.  If you are age 55 or younger, your body mass index should be between 19-25. Your Body mass index is 35.23 kg/m. If this is out of the aformentioned range listed, please consider follow up with your Primary Care Provider.   ________________________________________________________  The [facility] providers would like to encourage you to use [facility] to communicate with providers for non-urgent requests or questions.  Due to long hold times on the telephone, sending your provider a message by [facility] may be a faster and more efficient way to get a response.  Please allow 48 business hours for a response.  Please remember that this is for non-urgent requests.  _______________________________________________________ It was a pleasure to see you today!  Thank you for trusting me with your gastrointestinal care!

## 2023-01-12 DIAGNOSIS — Z96611 Presence of right artificial shoulder joint: Secondary | ICD-10-CM | POA: Diagnosis not present

## 2023-01-12 DIAGNOSIS — Z4789 Encounter for other orthopedic aftercare: Secondary | ICD-10-CM | POA: Diagnosis not present

## 2023-01-16 DIAGNOSIS — Z4789 Encounter for other orthopedic aftercare: Secondary | ICD-10-CM | POA: Diagnosis not present

## 2023-01-16 DIAGNOSIS — Z96611 Presence of right artificial shoulder joint: Secondary | ICD-10-CM | POA: Diagnosis not present

## 2023-01-18 DIAGNOSIS — Z96611 Presence of right artificial shoulder joint: Secondary | ICD-10-CM | POA: Diagnosis not present

## 2023-01-18 DIAGNOSIS — Z4789 Encounter for other orthopedic aftercare: Secondary | ICD-10-CM | POA: Diagnosis not present

## 2023-01-23 DIAGNOSIS — Z4789 Encounter for other orthopedic aftercare: Secondary | ICD-10-CM | POA: Diagnosis not present

## 2023-01-23 DIAGNOSIS — Z96611 Presence of right artificial shoulder joint: Secondary | ICD-10-CM | POA: Diagnosis not present

## 2023-01-31 ENCOUNTER — Other Ambulatory Visit: Payer: Self-pay | Admitting: Family Medicine

## 2023-01-31 DIAGNOSIS — Z96611 Presence of right artificial shoulder joint: Secondary | ICD-10-CM | POA: Diagnosis not present

## 2023-01-31 DIAGNOSIS — Z4789 Encounter for other orthopedic aftercare: Secondary | ICD-10-CM | POA: Diagnosis not present

## 2023-02-06 DIAGNOSIS — Z96611 Presence of right artificial shoulder joint: Secondary | ICD-10-CM | POA: Diagnosis not present

## 2023-02-06 DIAGNOSIS — Z4789 Encounter for other orthopedic aftercare: Secondary | ICD-10-CM | POA: Diagnosis not present

## 2023-02-09 DIAGNOSIS — Z4789 Encounter for other orthopedic aftercare: Secondary | ICD-10-CM | POA: Diagnosis not present

## 2023-02-09 DIAGNOSIS — Z96611 Presence of right artificial shoulder joint: Secondary | ICD-10-CM | POA: Diagnosis not present

## 2023-02-13 DIAGNOSIS — Z96611 Presence of right artificial shoulder joint: Secondary | ICD-10-CM | POA: Diagnosis not present

## 2023-02-13 DIAGNOSIS — Z4789 Encounter for other orthopedic aftercare: Secondary | ICD-10-CM | POA: Diagnosis not present

## 2023-02-16 DIAGNOSIS — Z4789 Encounter for other orthopedic aftercare: Secondary | ICD-10-CM | POA: Diagnosis not present

## 2023-02-16 DIAGNOSIS — Z96611 Presence of right artificial shoulder joint: Secondary | ICD-10-CM | POA: Diagnosis not present

## 2023-02-20 DIAGNOSIS — Z96611 Presence of right artificial shoulder joint: Secondary | ICD-10-CM | POA: Diagnosis not present

## 2023-02-20 DIAGNOSIS — Z4789 Encounter for other orthopedic aftercare: Secondary | ICD-10-CM | POA: Diagnosis not present

## 2023-02-22 DIAGNOSIS — H04123 Dry eye syndrome of bilateral lacrimal glands: Secondary | ICD-10-CM | POA: Diagnosis not present

## 2023-02-22 DIAGNOSIS — H401232 Low-tension glaucoma, bilateral, moderate stage: Secondary | ICD-10-CM | POA: Diagnosis not present

## 2023-02-22 DIAGNOSIS — H26492 Other secondary cataract, left eye: Secondary | ICD-10-CM | POA: Diagnosis not present

## 2023-02-22 DIAGNOSIS — Z961 Presence of intraocular lens: Secondary | ICD-10-CM | POA: Diagnosis not present

## 2023-02-23 DIAGNOSIS — Z96611 Presence of right artificial shoulder joint: Secondary | ICD-10-CM | POA: Diagnosis not present

## 2023-02-23 DIAGNOSIS — Z4789 Encounter for other orthopedic aftercare: Secondary | ICD-10-CM | POA: Diagnosis not present

## 2023-02-27 ENCOUNTER — Encounter: Payer: Self-pay | Admitting: Family Medicine

## 2023-02-27 ENCOUNTER — Ambulatory Visit (INDEPENDENT_AMBULATORY_CARE_PROVIDER_SITE_OTHER): Payer: Medicare HMO | Admitting: Family Medicine

## 2023-02-27 VITALS — BP 110/68 | HR 87 | Temp 98.7°F | Ht 62.0 in | Wt 191.5 lb

## 2023-02-27 DIAGNOSIS — E559 Vitamin D deficiency, unspecified: Secondary | ICD-10-CM | POA: Diagnosis not present

## 2023-02-27 DIAGNOSIS — I10 Essential (primary) hypertension: Secondary | ICD-10-CM

## 2023-02-27 DIAGNOSIS — Z1159 Encounter for screening for other viral diseases: Secondary | ICD-10-CM

## 2023-02-27 DIAGNOSIS — H9193 Unspecified hearing loss, bilateral: Secondary | ICD-10-CM | POA: Diagnosis not present

## 2023-02-27 DIAGNOSIS — Z0000 Encounter for general adult medical examination without abnormal findings: Secondary | ICD-10-CM | POA: Diagnosis not present

## 2023-02-27 LAB — CBC WITH DIFFERENTIAL/PLATELET
Basophils Absolute: 0.1 10*3/uL (ref 0.0–0.1)
Basophils Relative: 0.9 % (ref 0.0–3.0)
Eosinophils Absolute: 0.3 10*3/uL (ref 0.0–0.7)
Eosinophils Relative: 3.8 % (ref 0.0–5.0)
HCT: 35.7 % — ABNORMAL LOW (ref 36.0–46.0)
Hemoglobin: 11.4 g/dL — ABNORMAL LOW (ref 12.0–15.0)
Lymphocytes Relative: 17.5 % (ref 12.0–46.0)
Lymphs Abs: 1.4 10*3/uL (ref 0.7–4.0)
MCHC: 31.9 g/dL (ref 30.0–36.0)
MCV: 81.9 fL (ref 78.0–100.0)
Monocytes Absolute: 0.5 10*3/uL (ref 0.1–1.0)
Monocytes Relative: 6.1 % (ref 3.0–12.0)
Neutro Abs: 5.9 10*3/uL (ref 1.4–7.7)
Neutrophils Relative %: 71.7 % (ref 43.0–77.0)
Platelets: 349 10*3/uL (ref 150.0–400.0)
RBC: 4.36 Mil/uL (ref 3.87–5.11)
RDW: 15.8 % — ABNORMAL HIGH (ref 11.5–15.5)
WBC: 8.2 10*3/uL (ref 4.0–10.5)

## 2023-02-27 LAB — BASIC METABOLIC PANEL
BUN: 18 mg/dL (ref 6–23)
CO2: 32 meq/L (ref 19–32)
Calcium: 10.5 mg/dL (ref 8.4–10.5)
Chloride: 100 meq/L (ref 96–112)
Creatinine, Ser: 0.86 mg/dL (ref 0.40–1.20)
GFR: 65.41 mL/min (ref >60.00)
Glucose, Bld: 82 mg/dL (ref 70–99)
Potassium: 4.5 meq/L (ref 3.5–5.1)
Sodium: 140 meq/L (ref 135–145)

## 2023-02-27 LAB — HEPATIC FUNCTION PANEL
ALT: 14 U/L (ref 0–35)
AST: 19 U/L (ref 0–37)
Albumin: 4.1 g/dL (ref 3.5–5.2)
Alkaline Phosphatase: 120 U/L — ABNORMAL HIGH (ref 39–117)
Bilirubin, Direct: 0.1 mg/dL (ref 0.0–0.3)
Total Bilirubin: 0.4 mg/dL (ref 0.2–1.2)
Total Protein: 6.7 g/dL (ref 6.0–8.3)

## 2023-02-27 LAB — LIPID PANEL
Cholesterol: 132 mg/dL (ref 0–200)
HDL: 41.3 mg/dL (ref >39.00)
LDL Cholesterol: 70 mg/dL (ref 0–99)
NonHDL: 90.78
Total CHOL/HDL Ratio: 3
Triglycerides: 106 mg/dL (ref 0.0–149.0)
VLDL: 21.2 mg/dL (ref 0.0–40.0)

## 2023-02-27 LAB — VITAMIN D 25 HYDROXY (VIT D DEFICIENCY, FRACTURES): VITD: 49.05 ng/mL (ref 30.00–100.00)

## 2023-02-27 LAB — TSH: TSH: 1.5 u[IU]/mL (ref 0.35–5.50)

## 2023-02-27 MED ORDER — FLUOXETINE HCL 20 MG PO CAPS: 20.0000 mg | ORAL_CAPSULE | Freq: Every day | ORAL | 1 refills | Status: DC

## 2023-02-27 NOTE — Assessment & Plan Note (Signed)
Check labs and replete prn. 

## 2023-02-27 NOTE — Patient Instructions (Signed)
Follow up in 6 months to recheck blood pressure We'll notify you of your lab results and make any changes if needed Continue to work on healthy diet and regular exercise- you can do it! Call with any questions or concerns Stay Safe!  Stay Healthy! Happy Holidays!

## 2023-02-27 NOTE — Assessment & Plan Note (Signed)
Excellent control.  Currently asymptomatic.  Check labs but no anticipated med changes.  Will follow.

## 2023-02-27 NOTE — Assessment & Plan Note (Signed)
Pt's PE WNL w/ exception of BMI.  UTD on mammo, colonoscopy, immunizations.  Check labs.  Anticipatory guidance provided.  

## 2023-02-27 NOTE — Progress Notes (Signed)
   Subjective:    Patient ID: Andrea Byrd, female    DOB: 16-Nov-1945, 77 y.o.   MRN: [id number]  HPI CPE- UTD on Tdap, PNA, flu.  UTD on mammo, colonoscopy.  Patient Care Team    Relationship Specialty Notifications Start End  Sheliah Hatch, MD PCP - General Family Medicine  01/17/16   Napoleon Form, MD Consulting Physician Gastroenterology  02/23/16   Suanne Marker, MD Consulting Physician Neurology  02/23/16   Aris Lot, MD Consulting Physician Dermatology  08/30/16   Gaylord Shih, [facility]  Specialist  08/30/16   Erroll Luna, [facility] (Inactive)  Pharmacist  02/09/21    Comment: [phone]  Gean Birchwood [facility]    02/27/23     Health Maintenance  Topic Date Due   Hepatitis C Screening  Never done   Zoster Vaccines- Shingrix (1 of 2) Never done   COVID-19 Vaccine (4 - 2024-25 season) 11/05/2022   Medicare Annual Wellness (AWV)  06/28/2023   DTaP/Tdap/Td (3 - Td or Tdap) 12/06/2032   Pneumonia Vaccine 65+ Years old  Completed   INFLUENZA VACCINE  Completed   DEXA SCAN  Completed   HPV VACCINES  Aged Out   Colonoscopy  Discontinued      Review of Systems Patient reports no vision changes, adenopathy,fever, weight change,  persistant/recurrent hoarseness , swallowing issues, chest pain, palpitations, edema, persistant/recurrent cough, hemoptysis, dyspnea (rest/exertional/paroxysmal nocturnal), gastrointestinal bleeding (melena, rectal bleeding), abdominal pain, significant heartburn, bowel changes, GU symptoms (dysuria, hematuria, incontinence), Gyn symptoms (abnormal  bleeding, pain),  syncope, focal weakness, memory loss, skin/hair/nail changes, abnormal bruising or bleeding, anxiety, or depression.   + decreased hearing + neuropathy of feet    Objective:   Physical Exam General Appearance:    Alert, cooperative, no distress, appears stated age  Head:    Normocephalic, without obvious abnormality, atraumatic  Eyes:    PERRL,  conjunctiva/corneas clear, EOM's intact both eyes  Ears:    Normal TM's and external ear canals, both ears  Nose:   Nares normal, septum midline, mucosa normal, no drainage    or sinus tenderness  Throat:   Lips, mucosa, and tongue normal; teeth and gums normal  Neck:   Supple, symmetrical, trachea midline, no adenopathy;    Thyroid: no enlargement/tenderness/nodules  Back:     Symmetric, no curvature, ROM normal, no CVA tenderness  Lungs:     Clear to auscultation bilaterally, respirations unlabored  Chest Wall:    No tenderness or deformity   Heart:    Regular rate and rhythm, S1 and S2 normal, no murmur, rub   or gallop  Breast Exam:    Deferred to mammo  Abdomen:     Soft, non-tender, bowel sounds active all four quadrants,    no masses, no organomegaly  Genitalia:    Deferred  Rectal:    Extremities:   Extremities normal, atraumatic, no cyanosis or edema  Pulses:   2+ and symmetric all extremities  Skin:   Skin color, texture, turgor normal, no rashes or lesions  Lymph nodes:   Cervical, supraclavicular, and axillary nodes normal  Neurologic:   CNII-XII intact, normal strength, sensation and reflexes    throughout          Assessment & Plan:

## 2023-02-28 LAB — HEPATITIS C ANTIBODY: Hepatitis C Ab: NONREACTIVE

## 2023-03-02 DIAGNOSIS — Z4789 Encounter for other orthopedic aftercare: Secondary | ICD-10-CM | POA: Diagnosis not present

## 2023-03-02 DIAGNOSIS — Z96611 Presence of right artificial shoulder joint: Secondary | ICD-10-CM | POA: Diagnosis not present

## 2023-03-03 ENCOUNTER — Other Ambulatory Visit: Payer: Self-pay | Admitting: Family Medicine

## 2023-03-09 ENCOUNTER — Other Ambulatory Visit: Payer: Self-pay | Admitting: Family Medicine

## 2023-03-14 DIAGNOSIS — M2011 Hallux valgus (acquired), right foot: Secondary | ICD-10-CM | POA: Diagnosis not present

## 2023-03-19 ENCOUNTER — Other Ambulatory Visit: Payer: Self-pay | Admitting: Family Medicine

## 2023-03-19 DIAGNOSIS — Z96611 Presence of right artificial shoulder joint: Secondary | ICD-10-CM | POA: Diagnosis not present

## 2023-03-19 MED ORDER — PRAMIPEXOLE DIHYDROCHLORIDE 1 MG PO TABS: 1.0000 mg | ORAL_TABLET | Freq: Two times a day (BID) | ORAL | 1 refills | Status: DC

## 2023-03-19 NOTE — Telephone Encounter (Signed)
 Copied from CRM [id number]. Topic: Clinical - Medication Refill >> Mar 19, 2023  8:48 AM Viola FALCON wrote: Most Recent Primary Care Visit:  Provider: TABORI, KATHERINE E  Department: [facility]  Visit Type: PHYSICAL  Date: 02/27/2023  Medication: pramipexole  (MIRAPEX ) 1 MG tablet  Has the patient contacted their pharmacy? Yes (Agent: If no, request that the patient contact the pharmacy for the refill. If patient does not wish to contact the pharmacy document the reason why and proceed with request.) (Agent: If yes, when and what did the pharmacy advise?)  Is this the correct pharmacy for this prescription? Yes If no, delete pharmacy and type the correct one.  This is the patient's preferred pharmacy:  [id number] - [place], [place] - [address] [place] [address] Phone: [phone] Fax: [phone]   Has the prescription been filled recently? Yes  Is the patient out of the medication? Yes, patient cannot sleep   Has the patient been seen for an appointment in the last year OR does the patient have an upcoming appointment? Yes  Can we respond through MyChart? Yes  Agent: Please be advised that Rx refills may take up to 3 business days. We ask that you follow-up with your pharmacy.

## 2023-03-19 NOTE — Telephone Encounter (Signed)
 Copied from CRM [id number]. Topic: Clinical - Medication Refill >> Mar 19, 2023  8:48 AM Viola FALCON wrote: Most Recent Primary Care Visit:  Provider: TABORI, KATHERINE E  Department: [facility]  Visit Type: PHYSICAL  Date: 02/27/2023  Medication: ***  Has the patient contacted their pharmacy?  (Agent: If no, request that the patient contact the pharmacy for the refill. If patient does not wish to contact the pharmacy document the reason why and proceed with request.) (Agent: If yes, when and what did the pharmacy advise?)  Is this the correct pharmacy for this prescription?  If no, delete pharmacy and type the correct one.  This is the patient's preferred pharmacy:  [id number] - [place], [place] - [address] [place] [address] Phone: [phone] Fax: [phone]   Has the prescription been filled recently?   Is the patient out of the medication?   Has the patient been seen for an appointment in the last year OR does the patient have an upcoming appointment?   Can we respond through MyChart?   Agent: Please be advised that Rx refills may take up to 3 business days. We ask that you follow-up with your pharmacy.

## 2023-04-10 ENCOUNTER — Other Ambulatory Visit: Payer: Self-pay | Admitting: Family Medicine

## 2023-04-10 DIAGNOSIS — Z1231 Encounter for screening mammogram for malignant neoplasm of breast: Secondary | ICD-10-CM

## 2023-04-11 ENCOUNTER — Ambulatory Visit: Payer: Self-pay | Admitting: Family Medicine

## 2023-04-11 NOTE — Telephone Encounter (Signed)
 Appt made for tomorrow.

## 2023-04-11 NOTE — Telephone Encounter (Signed)
 Copied from CRM [id number]. Topic: Clinical - Red Word Triage >> Apr 11, 2023  3:54 PM Laurier C wrote: Red Word that prompted transfer to Nurse Triage: Patient has had a persistent cough since last night. She is also having headache and coughing up mucus.   Chief Complaint: Cough Symptoms: Cough, congestion, headache, earache. chills Frequency: Persistent cough Pertinent Negatives: Patient denies shortness of breath, recorded fever Disposition: [] ED /[] Urgent Care (no appt availability in office) / [x] Appointment(In office/virtual)/ []  [facility]/ [] Home Care/ [] Refused Recommended Disposition /[] [facility]/ []  Follow-up with PCP Additional Notes: Patient states that last night she developed a cough, headache, earache, and chills. She states that her cough is productive of green sputum. She denies any shortness of breath or recorded fever. Patient requesting an appointment for evaluation and treatment. Appointment made for tomorrow for the patient.      Reason for Disposition  Earache  Answer Assessment - Initial Assessment Questions 1. ONSET: When did the cough begin?      Yesterday  2. SEVERITY: How bad is the cough today?      Persistent cough 3. SPUTUM: Describe the color of your sputum (none, dry cough; clear, white, yellow, green)     Green 4. HEMOPTYSIS: Are you coughing up any blood? If so ask: How much? (flecks, streaks, tablespoons, etc.)     No 5. DIFFICULTY BREATHING: Are you having difficulty breathing? If Yes, ask: How bad is it? (e.g., mild, moderate, severe)    - MILD: No SOB at rest, mild SOB with walking, speaks normally in sentences, can lie down, no retractions, pulse < 100.    - MODERATE: SOB at rest, SOB with minimal exertion and prefers to sit, cannot lie down flat, speaks in phrases, mild retractions, audible wheezing, pulse 100-120.    - SEVERE: Very SOB at rest, speaks in single words, struggling to breathe, sitting hunched  forward, retractions, pulse > 120      No 6. FEVER: Do you have a fever? If Yes, ask: What is your temperature, how was it measured, and when did it start?     Chills, no recorded fever  7. CARDIAC HISTORY: Do you have any history of heart disease? (e.g., heart attack, congestive heart failure)      No 8. LUNG HISTORY: Do you have any history of lung disease?  (e.g., pulmonary embolus, asthma, emphysema)     No 9. PE RISK FACTORS: Do you have a history of blood clots? (or: recent major surgery, recent prolonged travel, bedridden)     No 10. OTHER SYMPTOMS: Do you have any other symptoms? (e.g., runny nose, wheezing, chest pain)       Headache, congestion, ear pain 11. PREGNANCY: Is there any chance you are pregnant? When was your last menstrual period?       No 12. TRAVEL: Have you traveled out of the country in the last month? (e.g., travel history, exposures)       No  Protocols used: Cough - Acute Productive-A-AH

## 2023-04-12 ENCOUNTER — Ambulatory Visit: Payer: Medicare HMO | Admitting: Family Medicine

## 2023-04-12 ENCOUNTER — Encounter: Payer: Self-pay | Admitting: Family Medicine

## 2023-04-12 VITALS — BP 122/62 | Temp 99.2°F | Ht 62.0 in | Wt 192.2 lb

## 2023-04-12 DIAGNOSIS — J111 Influenza due to unidentified influenza virus with other respiratory manifestations: Secondary | ICD-10-CM | POA: Diagnosis not present

## 2023-04-12 DIAGNOSIS — R051 Acute cough: Secondary | ICD-10-CM

## 2023-04-12 LAB — POC INFLUENZA A&B (BINAX/QUICKVUE)
Influenza A, POC: NEGATIVE
Influenza B, POC: NEGATIVE

## 2023-04-12 LAB — POC COVID19 BINAXNOW: SARS Coronavirus 2 Ag: NEGATIVE

## 2023-04-12 MED ORDER — PROMETHAZINE-DM 6.25-15 MG/5ML PO SYRP: 5.0000 mL | ORAL_SOLUTION | Freq: Four times a day (QID) | ORAL | 0 refills | Status: DC | PRN

## 2023-04-12 MED ORDER — OSELTAMIVIR PHOSPHATE 75 MG PO CAPS: 75.0000 mg | ORAL_CAPSULE | Freq: Two times a day (BID) | ORAL | 0 refills | Status: DC

## 2023-04-12 NOTE — Patient Instructions (Signed)
 Follow up as needed or as scheduled START the Tamiflu  twice daily Drink LOTS of fluids REST! Use the cough syrup as needed Call with any questions or concerns Happy [employer] Birthdays!!

## 2023-04-12 NOTE — Progress Notes (Signed)
   Subjective:    Patient ID: Andrea Byrd, female    DOB: 1945/07/03, 78 y.o.   MRN: [id number]  HPI URI- sxs started Tuesday night.  1st developed diarrhea.  Then cough, congestion.  + subjective fever.  'i feel like I've been run over by a truck'.  Denies skin sensitivity.  Bilateral ear pressure.  + HA.  Denies facial pain/pressure.  Cough is productive.  Pt reports this feels similar to previous flu illnesses.   Review of Systems For ROS see HPI     Objective:   Physical Exam Vitals reviewed.  Constitutional:      General: She is not in acute distress.    Appearance: She is well-developed.     Comments: Obviously not feeling well  HENT:     Head: Normocephalic and atraumatic.     Right Ear: Tympanic membrane and ear canal normal.     Left Ear: Tympanic membrane and ear canal normal.     Nose: Mucosal edema and congestion present. No rhinorrhea.     Right Sinus: No maxillary sinus tenderness or frontal sinus tenderness.     Left Sinus: No maxillary sinus tenderness or frontal sinus tenderness.     Mouth/Throat:     Pharynx: Uvula midline. Posterior oropharyngeal erythema present. No oropharyngeal exudate.  Eyes:     Conjunctiva/sclera: Conjunctivae normal.     Pupils: Pupils are equal, round, and reactive to light.  Cardiovascular:     Rate and Rhythm: Normal rate and regular rhythm.     Heart sounds: Normal heart sounds.  Pulmonary:     Effort: Pulmonary effort is normal. No respiratory distress.     Breath sounds: Normal breath sounds. No wheezing.     Comments: + hacking cough Musculoskeletal:     Cervical back: Normal range of motion and neck supple.  Lymphadenopathy:     Cervical: No cervical adenopathy.  Skin:    General: Skin is warm.  Neurological:     General: No focal deficit present.     Mental Status: She is alert and oriented to person, place, and time.  Psychiatric:        Mood and Affect: Mood normal.        Behavior: Behavior normal.            Assessment & Plan:  Influenza like illness- new.  Pt's rapid flu test is negative but there have been multiple false negative rapid tests.  Her sxs are consistent w/ influenza and feels similar to previous bouts.  Will start Tamiflu  and cough syrup prn.  Reviewed supportive care and red flags that should prompt return.  Pt expressed understanding and is in agreement w/ plan.

## 2023-04-17 ENCOUNTER — Ambulatory Visit: Payer: Self-pay | Admitting: Family Medicine

## 2023-04-17 MED ORDER — DOXYCYCLINE HYCLATE 100 MG PO TABS: 100.0000 mg | ORAL_TABLET | Freq: Two times a day (BID) | ORAL | 0 refills | Status: DC

## 2023-04-17 NOTE — Telephone Encounter (Signed)
Unfortunately we have been seeing a number of people develop pneumonia after having the flu this year.  Based on that, we will send in Doxycycline to treat for possible bacterial infection.  She can use OTC Robitussin or Delsym as needed for cough.  Drink lots of fluids.  Rest.  If not improving, she will need to be re-evaluated.

## 2023-04-17 NOTE — Telephone Encounter (Signed)
Copied from CRM [id number]. Topic: Clinical - Red Word Triage >> Apr 17, 2023  8:41 AM Andrea Byrd wrote: Red Word that prompted transfer to Nurse Triage: Seen last week for viral illness Was prescribed a cough syrup and Tamiflu Cough syrup only for an hour.  Coughing frequently, large amounts of green mucus.  Nasal drainage Body aches No shortness of breath.  No fever.   Chief Complaint: Cough-Productive Symptoms: Cough, Body Aches, Nasal Symptoms Frequency: Ongoing Pertinent Negatives: Patient denies chest pain, dyspnea, or stridor Disposition: [] ED /[] Urgent Care (no appt availability in office) / [x] Appointment(In office/virtual)/ []  [place]/ [] Home Care/ [] Refused Recommended Disposition /[] [place] [facility]/ []  Follow-up with PCP Additional Notes: Patient contacted via phone regarding concerns of an unimproved productive cough. Discussed symptoms, severity, and duration. Based on assessment, patient was advised to see PCP in office. Patient verbalized understanding and agreement with plan. Documentation provided.     Reason for Disposition  [1] Continuous (nonstop) coughing interferes with work or school AND [2] no improvement using cough treatment per Care Advice  Answer Assessment - Initial Assessment Questions 1. ONSET: "When did the cough begin?"      Ongoing  2. SEVERITY: "How bad is the cough today?"      Coughing Fits  3. SPUTUM: "Describe the color of your sputum" (none, dry cough; clear, white, yellow, green)     Green  4. HEMOPTYSIS: "Are you coughing up any blood?" If so ask: "How much?" (flecks, streaks, tablespoons, etc.)     No  5. DIFFICULTY BREATHING: "Are you having difficulty breathing?" If Yes, ask: "How bad is it?" (e.g., mild, moderate, severe)    - MILD: No SOB at rest, mild SOB with walking, speaks normally in sentences, can lie down, no retractions, pulse < 100.    - MODERATE: SOB at rest, SOB with minimal exertion and prefers to  sit, cannot lie down flat, speaks in phrases, mild retractions, audible wheezing, pulse 100-120.    - SEVERE: Very SOB at rest, speaks in single words, struggling to breathe, sitting hunched forward, retractions, pulse > 120      No  6. FEVER: "Do you have a fever?" If Yes, ask: "What is your temperature, how was it measured, and when did it start?"     No  7. CARDIAC HISTORY: "Do you have any history of heart disease?" (e.g., heart attack, congestive heart failure)      No  8. LUNG HISTORY: "Do you have any history of lung disease?"  (e.g., pulmonary embolus, asthma, emphysema)     No  9. PE RISK FACTORS: "Do you have a history of blood clots?" (or: recent major surgery, recent prolonged travel, bedridden)     No  10. OTHER SYMPTOMS: "Do you have any other symptoms?" (e.g., runny nose, wheezing, chest pain)       Body Aches, Post Nasal Drip  11. PREGNANCY: "Is there any chance you are pregnant?" "When was your last menstrual period?"       No  12. TRAVEL: "Have you traveled out of the country in the last month?" (e.g., travel history, exposures)       No  Protocols used: Cough - Acute Productive-A-AH

## 2023-04-17 NOTE — Telephone Encounter (Signed)
Patient called and informed and she will call back if she does not improve

## 2023-04-17 NOTE — Telephone Encounter (Signed)
Patient seen last week, please advise if there is anything additional that can be recommend

## 2023-04-18 ENCOUNTER — Ambulatory Visit: Payer: Medicare HMO | Admitting: Family Medicine

## 2023-04-18 ENCOUNTER — Ambulatory Visit: Payer: Medicare HMO | Admitting: Physician Assistant

## 2023-05-10 ENCOUNTER — Institutional Professional Consult (permissible substitution) (INDEPENDENT_AMBULATORY_CARE_PROVIDER_SITE_OTHER): Payer: Medicare HMO

## 2023-05-11 ENCOUNTER — Ambulatory Visit: Payer: Medicare HMO

## 2023-05-18 ENCOUNTER — Ambulatory Visit
Admission: RE | Admit: 2023-05-18 | Discharge: 2023-05-18 | Disposition: A | Discharge status: Home / Self Care | Source: Ambulatory Visit | Attending: Family Medicine | Admitting: Family Medicine

## 2023-05-18 DIAGNOSIS — Z1231 Encounter for screening mammogram for malignant neoplasm of breast: Secondary | ICD-10-CM

## 2023-05-24 ENCOUNTER — Ambulatory Visit (INDEPENDENT_AMBULATORY_CARE_PROVIDER_SITE_OTHER): Admitting: Family Medicine

## 2023-05-24 ENCOUNTER — Encounter: Payer: Self-pay | Admitting: Family Medicine

## 2023-05-24 VITALS — BP 136/68 | HR 88 | Temp 98.2°F | Wt 193.2 lb

## 2023-05-24 DIAGNOSIS — M25551 Pain in right hip: Secondary | ICD-10-CM

## 2023-05-24 MED ORDER — TIZANIDINE HCL 4 MG PO TABS
4.0000 mg | ORAL_TABLET | Freq: Three times a day (TID) | ORAL | 0 refills | Status: DC
Start: 2023-05-24 — End: 2023-06-15

## 2023-05-24 MED ORDER — PREDNISONE 10 MG PO TABS: ORAL_TABLET | ORAL | 0 refills | Status: DC

## 2023-05-24 NOTE — Patient Instructions (Signed)
 Follow up as needed or as scheduled START the Prednisone as directed- 3 pills at the same time x3 days, then 2 pills at the same time x3 days, then 1 pill daily.  Take w/ food  Use the muscle relaxers as needed for pain relief ICE the hip for pain relief If no improvement in pain, let me know and we'll make a referral Call with any questions or concerns Hang in there!! Happy Spring!!

## 2023-05-24 NOTE — Progress Notes (Signed)
 Subjective:    Patient ID: Andrea Byrd, female    DOB: Aug 27, 1945, 78 y.o.   MRN: [id number]  HPI R hip pain- pt reports she woke up one morning w/ pain.  Sxs started 2-3 weeks ago.  Initially thought she had pulled a muscle.  Pain started in R groin and now radiates around to her back.  No radiation of pain down into leg.  Pain is constant.  Nothing improves pain- ice, heat, Tylenol/ibuprofen.  Worse w/ position changes.  No known injury.  No change in activity level.  No urinary sxs.  No hx of similar.  No TTP- 'it's deep in there'.  Review of Systems For ROS see HPI     Objective:   Physical Exam Vitals reviewed.  Constitutional:      General: She is not in acute distress.    Appearance: Normal appearance. She is not ill-appearing.  HENT:     Head: Normocephalic and atraumatic.  Cardiovascular:     Pulses: Normal pulses.  Musculoskeletal:        General: No tenderness or deformity.     Right lower leg: No edema.     Comments: Pain w/ full flexion of R hip and full extension of R hip- preferred leg position is slightly bent Considerable pain w/ external rotation of R hip, minimal pain w/ internal rotation  Skin:    General: Skin is warm and dry.  Neurological:     General: No focal deficit present.     Mental Status: She is alert and oriented to person, place, and time.     Gait: Gait abnormal (antalgic gait).           Assessment & Plan:  R hip pain- new.  Pain w/ flexion, full extension, and particularly internal rotation.  No known injury.  Seems to be true hip pain.  Will start Prednisone taper and muscle relaxers to help w/ pain and inflammation.  If no improvement, will need ortho referral.  Pt expressed understanding and is in agreement w/ plan.

## 2023-05-30 ENCOUNTER — Other Ambulatory Visit: Payer: Self-pay | Admitting: Family Medicine

## 2023-05-30 ENCOUNTER — Other Ambulatory Visit: Payer: Self-pay | Admitting: Physician Assistant

## 2023-05-30 DIAGNOSIS — E876 Hypokalemia: Secondary | ICD-10-CM

## 2023-06-01 DIAGNOSIS — K219 Gastro-esophageal reflux disease without esophagitis: Secondary | ICD-10-CM | POA: Diagnosis not present

## 2023-06-01 DIAGNOSIS — Z008 Encounter for other general examination: Secondary | ICD-10-CM | POA: Diagnosis not present

## 2023-06-01 DIAGNOSIS — K5900 Constipation, unspecified: Secondary | ICD-10-CM | POA: Diagnosis not present

## 2023-06-01 DIAGNOSIS — Z791 Long term (current) use of non-steroidal anti-inflammatories (NSAID): Secondary | ICD-10-CM | POA: Diagnosis not present

## 2023-06-01 DIAGNOSIS — R32 Unspecified urinary incontinence: Secondary | ICD-10-CM | POA: Diagnosis not present

## 2023-06-01 DIAGNOSIS — M1990 Unspecified osteoarthritis, unspecified site: Secondary | ICD-10-CM | POA: Diagnosis not present

## 2023-06-01 DIAGNOSIS — Z833 Family history of diabetes mellitus: Secondary | ICD-10-CM | POA: Diagnosis not present

## 2023-06-01 DIAGNOSIS — M545 Low back pain, unspecified: Secondary | ICD-10-CM | POA: Diagnosis not present

## 2023-06-01 DIAGNOSIS — Z823 Family history of stroke: Secondary | ICD-10-CM | POA: Diagnosis not present

## 2023-06-01 DIAGNOSIS — I10 Essential (primary) hypertension: Secondary | ICD-10-CM | POA: Diagnosis not present

## 2023-06-01 DIAGNOSIS — F321 Major depressive disorder, single episode, moderate: Secondary | ICD-10-CM | POA: Diagnosis not present

## 2023-06-01 DIAGNOSIS — Z9181 History of falling: Secondary | ICD-10-CM | POA: Diagnosis not present

## 2023-06-01 DIAGNOSIS — G629 Polyneuropathy, unspecified: Secondary | ICD-10-CM | POA: Diagnosis not present

## 2023-06-04 ENCOUNTER — Ambulatory Visit (INDEPENDENT_AMBULATORY_CARE_PROVIDER_SITE_OTHER): Admitting: Student in an Organized Health Care Education/Training Program

## 2023-06-04 ENCOUNTER — Ambulatory Visit: Payer: Self-pay

## 2023-06-04 ENCOUNTER — Encounter: Payer: Self-pay | Admitting: Student in an Organized Health Care Education/Training Program

## 2023-06-04 ENCOUNTER — Ambulatory Visit (HOSPITAL_BASED_OUTPATIENT_CLINIC_OR_DEPARTMENT_OTHER)
Admission: RE | Admit: 2023-06-04 | Discharge: 2023-06-04 | Disposition: A | Discharge status: Home / Self Care | Source: Ambulatory Visit | Attending: Student in an Organized Health Care Education/Training Program | Admitting: Student in an Organized Health Care Education/Training Program

## 2023-06-04 VITALS — BP 138/64 | HR 82 | Temp 97.8°F | Ht 62.0 in | Wt 197.4 lb

## 2023-06-04 DIAGNOSIS — M25551 Pain in right hip: Secondary | ICD-10-CM | POA: Insufficient documentation

## 2023-06-04 DIAGNOSIS — M160 Bilateral primary osteoarthritis of hip: Secondary | ICD-10-CM | POA: Diagnosis not present

## 2023-06-04 MED ORDER — OXYCODONE-ACETAMINOPHEN 5-325 MG PO TABS: 1.0000 | ORAL_TABLET | Freq: Two times a day (BID) | ORAL | 0 refills | Status: DC | PRN

## 2023-06-04 NOTE — Telephone Encounter (Signed)
  Chief Complaint: right hip pain Symptoms: pain Frequency: constant Pertinent Negatives: Patient denies fever, sob,cp Disposition: [] ED /[] Urgent Care (no appt availability in office) / [x] Appointment(In office/virtual)/ []  [facility]/ [] Home Care/ [] Refused Recommended Disposition /[] [facility]/ []  Follow-up with PCP Additional Notes: per protocol apt made for this am; states was seen last week for this; care advice given, denies questions; instructed to go to ER if becomes worse.   Reason for Disposition  [1] SEVERE pain (e.g., excruciating, unable to do any normal activities) AND [2] not improved after 2 hours of pain medicine  Answer Assessment - Initial Assessment Questions 1. ONSET: "When did the pain start?"      Last week 2. LOCATION: "Where is the pain located?"      right hip joint 3. PAIN: "How bad is the pain?"    (Scale 1-10; or mild, moderate, severe)   -  MILD (1-3): doesn't interfere with normal activities    -  MODERATE (4-7): interferes with normal activities (e.g., work or school) or awakens from sleep, limping    -  SEVERE (8-10): excruciating pain, unable to do any normal activities, unable to walk     10/10 4. WORK OR EXERCISE: "Has there been any recent work or exercise that involved this part of the body?"      Woke up one morning like this about 2 months ago 5. CAUSE: "What do you think is causing the leg pain?"     unknown 6. OTHER SYMPTOMS: "Do you have any other symptoms?" (e.g., chest pain, back pain, breathing difficulty, swelling, rash, fever, numbness, weakness)     denies 7. PREGNANCY: "Is there any chance you are pregnant?" "When was your last menstrual period?"     na  Protocols used: Leg Pain-A-AH

## 2023-06-04 NOTE — Assessment & Plan Note (Signed)
 Acute issue over the last 1 month.  Treated with a course of prednisone without improvement.  Symptoms and exam are consistent with osteoarthritis of the right hip.  No signs of greater trochanteric pain syndrome.  I do not think this is radiating from the low back.  She has limited range of motion of the right hip on exam.  Will get x-ray today.  She is tried NSAIDs without relief.  Pain is becoming function limiting.  Will refer to orthopedics.  Will treat acute discomfort with oxycodone low-dose.  She has used this in the past after surgery and tolerated well.  I gave her cautions about sedation and risk of falls.  I recommended she does not use it with muscle relaxer.  Will prescribe #10 tablets to use no more than twice daily as needed for severe function limiting pain.

## 2023-06-04 NOTE — Progress Notes (Signed)
 Acute Office Visit  Subjective:     Patient ID: Andrea Byrd, female    DOB: 1945-10-02, 78 y.o.   MRN: [id number]  Chief Complaint  Patient presents with   Hip Pain    Rt hip pain for the past month 1/2. She stated that she was on prednisone but it is not doing any good     HPI  Patient is in today for right hip pain.  This started pretty suddenly about 1 month ago, woke her from sleep with this pain in her right hip.  She describes it as a C-shaped pain deep into her groin, radiating to her low back.  No radiculopathy down the right leg.  No recent falls or traumas.  No fevers or chills.  She has a lot of stiffness in the hip.  It has made it difficult for her to walk.  Starting to become function limiting.  She has tried nonsteroidal anti-inflammatories without benefit.  Her primary care physician prescribed a prednisone course which she did not find beneficial.  Denies any other changes in her health recently.  No chest pain or shortness of breath.  She reports depression symptoms are stable.  She has had orthopedic procedures in the past on shoulder replacements in 2023.  Used the [facility] office.  Denies any history of gout or pseudogout.      Objective:    BP 138/64   Pulse 82   Temp 97.8 F (36.6 C)   Ht 5\' 2"  (1.575 m)   Wt 197 lb 6.4 oz (89.5 kg)   SpO2 98%   BMI 36.10 kg/m    Physical Exam  General: Tired appearing woman, mildly depressed affect Neuro: Alert, conversational, full strength upper and lower extremities, delayed get up and go, wide-based gait, difficulty getting on the table because of hip pain. Extremities: Right leg has limited range of motion on internal rotation at the hip, left hip range of motion is normal.  There is discomfort with internal rotation on the right.  Trace pitting edema bilateral lower extremities.  Normal knee joints.  No changes of the skin.      Assessment & Plan:   Problem List Items Addressed This Visit        Unprioritized   Right hip pain - Primary   Acute issue over the last 1 month.  Treated with a course of prednisone without improvement.  Symptoms and exam are consistent with osteoarthritis of the right hip.  No signs of greater trochanteric pain syndrome.  I do not think this is radiating from the low back.  She has limited range of motion of the right hip on exam.  Will get x-ray today.  She is tried NSAIDs without relief.  Pain is becoming function limiting.  Will refer to orthopedics.  Will treat acute discomfort with oxycodone low-dose.  She has used this in the past after surgery and tolerated well.  I gave her cautions about sedation and risk of falls.  I recommended she does not use it with muscle relaxer.  Will prescribe #10 tablets to use no more than twice daily as needed for severe function limiting pain.      Relevant Medications   oxyCODONE-acetaminophen (PERCOCET/ROXICET) 5-325 MG tablet   Other Relevant Orders   DG Hip Unilat W OR W/O Pelvis 2-3 Views Right   Ambulatory referral to Orthopedic Surgery    Meds ordered this encounter  Medications   oxyCODONE-acetaminophen (PERCOCET/ROXICET) 5-325 MG tablet  Sig: Take 1 tablet by mouth 2 (two) times daily as needed for severe pain (pain score 7-10).    Dispense:  10 tablet    Refill:  0    No follow-ups on file.  Tyson Alias, MD

## 2023-06-05 ENCOUNTER — Telehealth: Payer: Self-pay | Admitting: Student in an Organized Health Care Education/Training Program

## 2023-06-05 DIAGNOSIS — M25551 Pain in right hip: Secondary | ICD-10-CM | POA: Diagnosis not present

## 2023-06-05 NOTE — Telephone Encounter (Signed)
 Spoke with the patient this morning by phone about x-ray results.  Radiology read is still pending.  On my view she has  moderate to severe osteoarthritis in the right hip with joint space narrowing.  This is consistent with her exam.  We talked about osteoarthritis, lack of effective medical treatments, and likelihood of needing joint replacement to return to full functioning.  She is tolerating low-dose oxycodone okay, helped her to sleep a little last night.  She is going to call [facility] today to schedule an appointment, I sent a referral yesterday.

## 2023-06-06 DIAGNOSIS — M25551 Pain in right hip: Secondary | ICD-10-CM | POA: Diagnosis not present

## 2023-06-08 DIAGNOSIS — M1611 Unilateral primary osteoarthritis, right hip: Secondary | ICD-10-CM | POA: Diagnosis not present

## 2023-06-11 ENCOUNTER — Telehealth: Payer: Self-pay | Admitting: Family Medicine

## 2023-06-11 NOTE — Telephone Encounter (Signed)
 Type of form received: Surgical Clearance Forms  Additional comments:   Received by: [facility] - Front Desk  Form should be Faxed/mailed to: (address/ fax #) Fax to [phone]  Is patient requesting call for pickup: N/A  Form placed:  Labeled & placed in provider bin  Attach charge sheet.  Provider will determine charge.  Individual made aware of 3-5 business day turn around? N/A

## 2023-06-11 NOTE — Telephone Encounter (Signed)
 Placed in folder at nurse station. Attached front desk Cassie to see if pt has been scheduled

## 2023-06-12 NOTE — Telephone Encounter (Signed)
 Patient is currently not scheduled for a visit, will reach out to her today.

## 2023-06-12 NOTE — Telephone Encounter (Signed)
 Pt scheduled for Friday 4/11 for surgical clearance

## 2023-06-13 NOTE — Patient Instructions (Signed)

## 2023-06-13 NOTE — Progress Notes (Unsigned)
 PATIENT: Andrea Byrd DOB: Oct 24, 1945  REASON FOR VISIT: follow up HISTORY FROM: patient  No chief complaint on file.    HISTORY OF PRESENT ILLNESS:  06/13/23 ALL: Andrea Byrd returns for follow up for neuropathy and RLS. At last visit 04/2022 we continued pramipexole IR 1-2mg  at bedtime and added an extended release dose of 1.5mg  daily. Since,   05/04/2022 ALL:  Andrea Byrd returns for follow up for neuropathy and RLS. She was last seen 10/2021. We resent rx for neuropathy cream and continued pramipexole. Duloxetine was switched to fluoxetine per PCP. Since, she has not noted any significant benefit from neuropathy cream. Neuropathy and RLS unchanged. She feels that her legs never really rest. She is taking pramipexole 2mg  at bedtime that gives some relief. She is tolerating well. She is s/p right foot surgery with another planned next week. She has had Covid twice since we last met. She is adjusting to changes following Mr Vandyken's passing. She continues PT.   10/26/2021 ALL: Andrea Byrd returns for follow up. She was last seen 04/2021 and neuropathy was worsening. We continued pramipexole 1mg  BID and started a compounded neuropathy cream. She has continued pramipexole but reports accidentally cancelling rx for neuropathy cream. She had mistaken the pharmacist for her local pharmacist about another medication. She lost her husband in March. She reports neuropathy continues to worsen. She is having numbness/tingling/cold sensation of both feet. Occasional shooting pains. She does feel gait is off at times. She is scheduled for right bunion correction, 2nd hammer toe, 2nd metatarsal weil and tendon lengthening in two days. She feels this may be contributing to imbalance. She anticipates PT following surgery.   05/02/2021 ALL: Andrea Byrd returns for follow up for RLS and dysesthesias. She was last seen 10/2019. She continues pramipexole 1mg  BID. Gabapentin was discontinued by PCP about 2-3 months ago. She did not feel it was  helping. Now on duloxetine 20mg  daily. 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 She continues to have numbness/tingling and describes a cold sensation of both feet. No changes in gait. She did have a fall a couple of months ago after her sock got stuck on glue that was dropped on the floor. She reports not being able to feel the hot glue through her sock. She continues to provide total care for her husband who is now bed bound. He is now being seen by hospice. She has an aide that helps for a few hours three times a week.   10/08/2019 ALL:  Andrea Byrd is a 78 y.o. female here today for follow up for RLS and neuropathic pain of both feet. She continues gabapentin 600mg  BID and pramipexole 1mg  BID. She reports symptoms are about the same. She does have sharp shooting pains of both feel regularly. She does not wish to change treatment plan at this time. She is selling her home and building a smaller home. She continues to provide care to her disabled husband. Her sister passed away last month. She is seen regularly by PCP who currently refills all medications.   HISTORY: (copied from my note on 10/07/2018)  Andrea Byrd is a 78 y.o. female here today for follow up for restless legs. She continues pramipexole 1mg  BID and gabapentin 600mg  BID. She does continue to have aching in bilateral legs at night. She feels that this is manageable and does not wish to increase/add medications. She stays busy as caregiver for her  husband post CVA and her sister who recently fell ill. She is tolerating medications well with no adverse effects noted.    HISTORY: (copied from Dr Richrd Humbles note on 10/02/2017)   UPDATE (10/02/17, VRP): Since last visit, doing well. Symptoms are mild. Severity is mild. No alleviating or aggravating factors. Tolerating meds. (on side note, pt husband is in hospital with stroke, and planning to be d/c'd today).    UPDATE 08/29/16: Since last  visit, RLS stable on gabapentin and pramipexole. Sharp pains in toes and feet slightly worse.    UPDATE 09/01/15: Since last visit, doing well. On gabapentin 600mg  BID + pramipexole 1mg  BID. Sxs well controlled. No side effects.    UPDATE 08/25/14: Since last visit, doing well. Has had chiro tx for back issues and feels better. On gabapentin 300mg  TID + pramipex 1mg  qhs. Satisfied with current dosing.    UPDATE 04/17/14: Since last visit, symptoms have continued. Numbness in feet, restless legs at night. On gabapentin 300mg  TID. PCP also tried her on pramipexole 0.25mg x3 at bedtime.    UPDATE 08/13/12: 78 year old right-handed female with history of hypertension, restless leg syndrome, here for evaluation of progressive numbness and tingling in her toes and feet. I previously saw this patient in October 2013 as referred by primary care physician. Now patient is referred to me by orthopedic surgery. Since last visit patient's symptoms have progressed. Now she has numbness and tingling in her bilateral feet up to her ankles. She has intermittent sharp sensations as well. Symptoms are fairly symmetric. No symptoms in her upper legs, lower back, torso, arms or neck.   PRIOR HPI (12/18/11): 78 year old right-handed female with history of hypertension, this is a syndrome, here for evaluation of numbness in her feet for the past 3 months. Patient describes numbness and tingling in her toes and bottom of her feet. No significant pain. She also feels coldness in her feet. No back pain or symptoms radiating from her back to her feet. No symptoms in her hands or fingers. No neck pain. Symptoms are mild and gradually progressing. 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 She did not have any numbness in the symptoms at that time.     REVIEW OF SYSTEMS: Out of a complete 14 system review of symptoms, the patient complains  only of the following symptoms, restless legs, dysesthesias and all other reviewed systems are negative.   ALLERGIES: Allergies  Allergen Reactions   Nitrofurantoin     Makes mouth break out/blisters   Propoxyphene N-Acetaminophen     REACTION: nausea   Nexium [Esomeprazole Magnesium] Nausea And Vomiting   Nortriptyline Hcl Nausea And Vomiting   Hydrocodone Bit-Homatrop Mbr Itching and Rash   Morphine Itching and Rash    HOME MEDICATIONS: Outpatient Medications Prior to Visit  Medication Sig Dispense Refill   AREXVY 120 MCG/0.5ML injection      BOOSTRIX 5-2.5-18.5 LF-MCG/0.5 injection      dicyclomine (BENTYL) 10 MG capsule Take 1 capsule (10 mg total) by mouth every 4 (four) hours as needed for spasms. 30 capsule 5   FLUoxetine (PROZAC) 20 MG capsule Take 1 capsule (20 mg total) by mouth daily. 90 capsule 1   FLUZONE HIGH-DOSE 0.5 ML injection      furosemide (LASIX) 20 MG tablet TAKE 1 TABLET BY MOUTH EVERY DAY 90 tablet 1   KLOR-CON M20 20 MEQ tablet TAKE 1 TABLET BY MOUTH EVERY DAY 90  tablet 1   latanoprost (XALATAN) 0.005 % ophthalmic solution Place 1 drop into both eyes at bedtime.      losartan-hydrochlorothiazide (HYZAAR) 50-12.5 MG tablet TAKE 1 TABLET BY MOUTH EVERY DAY 90 tablet 1   naproxen (NAPROSYN) 500 MG tablet Take 500 mg by mouth 2 (two) times daily.     omeprazole (PRILOSEC) 20 MG capsule TAKE 1 CAPSULE 2 TIMES DAILY 30-60 MINUTES BEFORE BREAKFAST AND DINNER. 180 capsule 1   oxyCODONE-acetaminophen (PERCOCET/ROXICET) 5-325 MG tablet Take 1 tablet by mouth 2 (two) times daily as needed for severe pain (pain score 7-10). 10 tablet 0   pramipexole (MIRAPEX) 1 MG tablet Take 1 tablet (1 mg total) by mouth 2 (two) times daily. 180 tablet 1   tiZANidine (ZANAFLEX) 4 MG tablet Take 1 tablet (4 mg total) by mouth 3 (three) times daily. 90 tablet 0   Vitamin D, Ergocalciferol, (DRISDOL) 1.25 MG (50000 UNIT) CAPS capsule Take 50,000 Units by mouth once a week.     No  facility-administered medications prior to visit.    PAST MEDICAL HISTORY: Past Medical History:  Diagnosis Date   Anemia    Arthritis    Cervical cancer (HCC) 1977   Diverticulosis    Dry eyes    Endometriosis    Esophageal stricture    Gastritis    GERD (gastroesophageal reflux disease)    Glaucoma    Herpes simplex type 1 infection    Hiatal hernia    History of colon polyps    History of gallstones    Hypertension    IBS (irritable bowel syndrome)    Insomnia    Post-operative nausea and vomiting    Restless leg syndrome     PAST SURGICAL HISTORY: Past Surgical History:  Procedure Laterality Date   ABDOMINAL HYSTERECTOMY  1998   APPENDECTOMY  1998   BUNIONECTOMY WITH HAMMERTOE RECONSTRUCTION Right 11/03/2021   Procedure: Right bunion correction with double osteotomy; Second Metatarsal weil; Second hammertoe correction; third through fifth tendon lengthening; excision of bunionette;  Surgeon: Toni Arthurs, MD;  Location: [facility];  Service: Orthopedics;  Laterality: Right;   CERVICAL CONE BIOPSY     CHOLECYSTECTOMY  1980   COLON RESECTION  2002   COLON SURGERY  2003   removed 2 feet colon   ESOPHAGEAL MANOMETRY N/A 04/24/2016   Procedure: ESOPHAGEAL MANOMETRY (EM);  Surgeon: Napoleon Form, MD;  Location: [facility];  Service: Endoscopy;  Laterality: N/A;   HAND SURGERY     right/ thumb joints replaced both hands   OOPHORECTOMY     right foot surgery Right 08/15/2022   SHOULDER ARTHROSCOPY  02/28/2022   TOTAL SHOULDER REPLACEMENT Right 06/19/2022   TUBAL LIGATION      FAMILY HISTORY: Family History  Problem Relation Age of Onset   Diabetes Mother    Stroke Mother        brother, sister, MGM   Colon polyps Mother    Heart disease Father    Kidney disease Father    Colon polyps Father    Lung disease Father    Kidney cancer Sister    Stroke Sister    Diabetes Sister    Colon polyps Sister    Celiac disease Other        niece    Heart attack Paternal Grandfather    Colon cancer Neg Hx    Esophageal cancer Neg Hx    Rectal cancer Neg Hx    Stomach cancer Neg Hx  SOCIAL HISTORY: Social History   Socioeconomic History   Marital status: Widowed    Spouse name: Not on file   Number of children: 1   Years of education: Not on file   Highest education level: Not on file  Occupational History   Occupation: Retired  Tobacco Use   Smoking status: Never   Smokeless tobacco: Never  Vaping Use   Vaping status: Never Used  Substance and Sexual Activity   Alcohol use: No   Drug use: No   Sexual activity: Not Currently  Other Topics Concern   Not on file  Social History Narrative   Lives with husband floyd   Drinks maybe 4 sodas a week   Social Drivers of Corporate investment banker Strain: Low Risk  (06/28/2022)   Overall Financial Resource Strain (CARDIA)    Difficulty of Paying Living Expenses: Not hard at all  Food Insecurity: No Food Insecurity (06/28/2022)   Hunger Vital Sign    Worried About Running Out of Food in the Last Year: Never true    Ran Out of Food in the Last Year: Never true  Transportation Needs: No Transportation Needs (06/28/2022)   PRAPARE - Administrator, Civil Service (Medical): No    Lack of Transportation (Non-Medical): No  Physical Activity: Inactive (06/28/2022)   Exercise Vital Sign    Days of Exercise per Week: 0 days    Minutes of Exercise per Session: 0 min  Stress: No Stress Concern Present (06/28/2022)   [employer] of Occupational Health - Occupational Stress Questionnaire    Feeling of Stress : Not at all  Social Connections: Moderately Integrated (06/28/2022)   Social Connection and Isolation Panel [NHANES]    Frequency of Communication with Friends and Family: More than three times a week    Frequency of Social Gatherings with Friends and Family: Three times a week    Attends Religious Services: More than 4 times per year    Active Member of  Clubs or Organizations: Yes    Attends Banker Meetings: More than 4 times per year    Marital Status: Widowed  Intimate Partner Violence: Not At Risk (06/28/2022)   Humiliation, Afraid, Rape, and Kick questionnaire    Fear of Current or Ex-Partner: No    Emotionally Abused: No    Physically Abused: No    Sexually Abused: No      PHYSICAL EXAM  There were no vitals filed for this visit.     There is no height or weight on file to calculate BMI.  Generalized: Well developed, in no acute distress  Cardiology: normal rate and rhythm, no murmur noted Respiratory: clear to auscultation bilaterally  Neurological examination  Mentation: Alert oriented to time, place, history taking. Follows all commands speech and language fluent Cranial nerve II-XII: Pupils were equal round reactive to light. Extraocular movements were full, visual field were full on confrontational test. Facial sensation and strength were normal. Uvula tongue midline. Head turning and shoulder shrug  were normal and symmetric. Motor: The motor testing reveals 5 over 5 strength of all 4 extremities. Good symmetric motor tone is noted throughout.  Sensory: Sensory testing is intact to soft touch on all 4 extremities with exception of decreased sensation to all toes bilaterally. No evidence of extinction is noted.  Coordination: Cerebellar testing reveals good finger-nose-finger and heel-to-shin bilaterally.  Gait and station: Gait is normal.    DIAGNOSTIC DATA (LABS, IMAGING, TESTING) - I reviewed patient  records, labs, notes, testing and imaging myself where available.     09/26/2017    1:26 PM  MMSE - Mini Mental State Exam  Orientation to time 5  Orientation to Place 5  Registration 3  Attention/ Calculation 5  Recall 3  Language- name 2 objects 2  Language- repeat 1  Language- follow 3 step command 3  Language- read & follow direction 1  Write a sentence 1  Copy design 1  Total score 30      Lab Results  Component Value Date   WBC 8.2 02/27/2023   HGB 11.4 (L) 02/27/2023   HCT 35.7 (L) 02/27/2023   MCV 81.9 02/27/2023   PLT 349.0 02/27/2023      Component Value Date/Time   NA 140 02/27/2023 1028   NA 141 10/11/2015 0000   K 4.5 02/27/2023 1028   CL 100 02/27/2023 1028   CO2 32 02/27/2023 1028   GLUCOSE 82 02/27/2023 1028   BUN 18 02/27/2023 1028   BUN 13 10/11/2015 0000   CREATININE 0.86 02/27/2023 1028   CREATININE 0.88 08/06/2017 1651   CALCIUM 10.5 02/27/2023 1028   PROT 6.7 02/27/2023 1028   ALBUMIN 4.1 02/27/2023 1028   AST 19 02/27/2023 1028   ALT 14 02/27/2023 1028   ALKPHOS 120 (H) 02/27/2023 1028   BILITOT 0.4 02/27/2023 1028   GFRNONAA >60 10/31/2021 1257   GFRAA >60 10/05/2017 1535   Lab Results  Component Value Date   CHOL 132 02/27/2023   HDL 41.30 02/27/2023   LDLCALC 70 02/27/2023   TRIG 106.0 02/27/2023   CHOLHDL 3 02/27/2023   Lab Results  Component Value Date   HGBA1C 5.6 08/19/2021   No results found for: "VITAMINB12" Lab Results  Component Value Date   TSH 1.50 02/27/2023      ASSESSMENT AND PLAN 78 y.o. year old female  has a past medical history of Anemia, Arthritis, Cervical cancer (HCC) (1977), Diverticulosis, Dry eyes, Endometriosis, Esophageal stricture, Gastritis, GERD (gastroesophageal reflux disease), Glaucoma, Herpes simplex type 1 infection, Hiatal hernia, History of colon polyps, History of gallstones, Hypertension, IBS (irritable bowel syndrome), Insomnia, Post-operative nausea and vomiting, and Restless leg syndrome. here with     ICD-10-CM   1. Restless legs syndrome (RLS)  G25.81     2. Dysesthesia  R20.8        [place] reports that neuropathy and RLS are unchanged from previous visit. Neuropathy cream was not beneficial. She feels that her legs never rest. I will have her continue pramipexole IR 1-2mg  at bedtime and we will add an extended release dose of 1.5mg  daily. Will consider adding agents for  neuropathy pain in future if she wishes. She was encouraged to continue focusing on healthy lifestyle habits. She will follow up with me in 6 months. May follow up in 1 year if stable. She verbalizes understanding and agreement with this plan.    No orders of the defined types were placed in this encounter.    No orders of the defined types were placed in this encounter.     Shawnie Dapper, FNP-C 06/13/2023, 4:49 PM [facility] [address] [place], [place] [address] [phone]

## 2023-06-14 ENCOUNTER — Ambulatory Visit: Payer: Medicare HMO | Admitting: Family Medicine

## 2023-06-14 ENCOUNTER — Encounter: Payer: Self-pay | Admitting: Family Medicine

## 2023-06-14 VITALS — BP 140/80 | HR 76 | Ht 61.0 in | Wt 197.0 lb

## 2023-06-14 DIAGNOSIS — R208 Other disturbances of skin sensation: Secondary | ICD-10-CM | POA: Diagnosis not present

## 2023-06-14 DIAGNOSIS — G2581 Restless legs syndrome: Secondary | ICD-10-CM

## 2023-06-14 MED ORDER — PRAMIPEXOLE DIHYDROCHLORIDE 1 MG PO TABS
1.0000 mg | ORAL_TABLET | Freq: Two times a day (BID) | ORAL | 3 refills | Status: DC
Start: 2023-06-14 — End: 2023-11-01

## 2023-06-15 ENCOUNTER — Other Ambulatory Visit: Payer: Self-pay | Admitting: Family Medicine

## 2023-06-15 ENCOUNTER — Encounter: Payer: Self-pay | Admitting: Family Medicine

## 2023-06-15 ENCOUNTER — Ambulatory Visit (INDEPENDENT_AMBULATORY_CARE_PROVIDER_SITE_OTHER): Admitting: Family Medicine

## 2023-06-15 VITALS — BP 112/60 | HR 78 | Temp 97.8°F | Ht 61.0 in | Wt 197.5 lb

## 2023-06-15 DIAGNOSIS — Z01818 Encounter for other preprocedural examination: Secondary | ICD-10-CM | POA: Diagnosis not present

## 2023-06-15 NOTE — Telephone Encounter (Signed)
 Faxed

## 2023-06-15 NOTE — Telephone Encounter (Signed)
 Signed and returned to [place] to fax

## 2023-06-15 NOTE — Progress Notes (Signed)
 Subjective:    Andrea Byrd is a 78 y.o. female who presents to the office today for a preoperative consultation at the request of surgeon Dr Lequita Halt who plans on performing R total hip on April 28. This consultation is requested for the specific conditions prompting preoperative evaluation (i.e. because of potential affect on operative risk): obesity, HTN. Planned anesthesia:  choice . The patient has the following known anesthesia issues:  no hx of anesthesia issues . Patients bleeding risk: no recent abnormal bleeding, no remote history of abnormal bleeding, and no use of Ca-channel blockers. Patient does not have objections to receiving blood products if needed.  The following portions of the patient's history were reviewed and updated as appropriate: allergies, current medications, past family history, past medical history, past social history, past surgical history, and problem list.  Review of Systems A comprehensive review of systems was negative.    Objective:    BP 112/60   Pulse 78   Temp 97.8 F (36.6 C)   Ht 5\' 1"  (1.549 m)   Wt 197 lb 8 oz (89.6 kg)   SpO2 98%   BMI 37.32 kg/m   General Appearance:    Alert, cooperative, no distress, appears stated age  Head:    Normocephalic, without obvious abnormality, atraumatic  Eyes:    PERRL, conjunctiva/corneas clear, EOM's intact both eyes  Ears:    Normal TM's and external ear canals, both ears  Nose:   Nares normal, septum midline, mucosa normal, no drainage  or sinus tenderness  Throat:   Lips, mucosa, and tongue normal; teeth and gums normal  Neck:   Supple, symmetrical, trachea midline, no adenopathy;    thyroid:  no enlargement/tenderness/nodules  Back:     Symmetric, no curvature, ROM normal, no CVA tenderness  Lungs:     Clear to auscultation bilaterally, respirations unlabored  Chest Wall:    No tenderness or deformity   Heart:    Regular rate and rhythm, S1 and S2 normal, no murmur, rub   or gallop  Breast Exam:     No tenderness, masses, or nipple abnormality  Abdomen:     Soft, non-tender, bowel sounds active all four quadrants,    no masses, no organomegaly  Genitalia:    deferred  Rectal:    Extremities:   Extremities normal, atraumatic, no cyanosis or edema  Pulses:   2+ and symmetric all extremities  Skin:   Skin color, texture, turgor normal, no rashes or lesions  Lymph nodes:   Cervical, supraclavicular, and axillary nodes normal  Neurologic:   CNII-XII intact, normal strength, sensation and reflexes    throughout    Predictors of intubation difficulty:  Morbid obesity? no  Anatomically abnormal facies? no  Prominent incisors? no  Receding mandible? no  Short, thick neck? no  Neck range of motion: normal  Dentition: Chipped teeth present (2 upper front teeth)  Cardiographics ECG:  NSR, no concerns Echocardiogram:  NA  Imaging Chest x-ray:  NA    Lab Review  No visits with results within 2 Month(s) from this visit.  Latest known visit with results is:  Office Visit on 04/12/2023  Component Date Value   Influenza A, POC 04/12/2023 Negative    Influenza B, POC 04/12/2023 Negative    SARS Coronavirus 2 Ag 04/12/2023 Negative       Assessment:      78 y.o. female with planned surgery as above.   Known risk factors for perioperative complications: None  Difficulty with intubation is not anticipated.  Cardiac Risk Estimation: low  Current medications which may produce withdrawal symptoms if withheld perioperatively: none    Plan:    1. Preoperative workup as follows ECG. 2. Change in medication regimen before surgery: discontinue NSAIDs (Naproxen) 14 days before surgery. 3. Prophylaxis for cardiac events with perioperative beta-blockers: not indicated. 4. Invasive hemodynamic monitoring perioperatively: at the discretion of anesthesiologist. 5. Deep vein thrombosis prophylaxis postoperatively:regimen to be chosen by surgical team. 6. Surveillance for postoperative MI  with ECG immediately postoperatively and on postoperative days 1 and 2 AND troponin levels 24 hours postoperatively and on day 4 or hospital discharge (whichever comes first): at the discretion of anesthesiologist. 7. Other measures:  none

## 2023-06-19 NOTE — H&P (Signed)
 TOTAL HIP ADMISSION H&P  Patient is admitted for right total hip arthroplasty.  Subjective:  Chief Complaint: Right hip pain  HPI: Andrea Byrd, 78 y.o. female, has a history of pain and functional disability in the right hip due to arthritis and patient has failed non-surgical conservative treatments for greater than 12 weeks to include NSAID's and/or analgesics, use of assistive devices, and activity modification. Onset of symptoms was abrupt, starting  less than one  years ago with rapidlly worsening course since that time. The patient noted no past surgery on the right hip. Patient currently rates pain in the right hip at 9 out of 10 with activity. Patient has night pain, worsening of pain with activity and weight bearing, pain that interfers with activities of daily living, and pain with passive range of motion. Patient has evidence of  severe, rapidly progressive osteoarthritis with a large joint effusion, iliopsoas bursitis, collapse of the superior aspect of the femoral head, and significant edema in the femoral head extending to the femoral neck  by imaging studies. This condition presents safety issues increasing the risk of falls.  There is no current active infection.  Patient Active Problem List   Diagnosis Date Noted   Right hip pain 06/04/2023   Tailor's bunion of right foot 07/08/2021   Metatarsalgia of right foot 07/08/2021   Pain in joint of right shoulder 04/29/2021   Vitamin D deficiency 02/18/2021   Adjustment disorder 02/18/2021   Physical exam 11/14/2019   Pain in left knee 01/28/2018   Restless legs syndrome (RLS) 08/29/2016   Lumbar radiculopathy 08/29/2016   Obesity (BMI 30-39.9) 02/23/2016   Neuropathy 08/13/2012   Essential hypertension 02/05/2009   ARTHRITIS 02/05/2009   Dysphagia 02/05/2009   ESOPHAGEAL STRICTURE 02/04/2009   GERD 02/04/2009   GASTRITIS 02/04/2009   HIATAL HERNIA 02/04/2009   DIVERTICULOSIS OF COLON 02/04/2009   IRRITABLE BOWEL SYNDROME  02/04/2009   ENDOMETRIOSIS 02/04/2009    Past Medical History:  Diagnosis Date   Anemia    Arthritis    Cervical cancer (HCC) 1977   Diverticulosis    Dry eyes    Endometriosis    Esophageal stricture    Gastritis    GERD (gastroesophageal reflux disease)    Glaucoma    Herpes simplex type 1 infection    Hiatal hernia    History of colon polyps    History of gallstones    Hypertension    IBS (irritable bowel syndrome)    Insomnia    Post-operative nausea and vomiting    Restless leg syndrome     Past Surgical History:  Procedure Laterality Date   ABDOMINAL HYSTERECTOMY  1998   APPENDECTOMY  1998   BUNIONECTOMY WITH HAMMERTOE RECONSTRUCTION Right 11/03/2021   Procedure: Right bunion correction with double osteotomy; Second Metatarsal weil; Second hammertoe correction; third through fifth tendon lengthening; excision of bunionette;  Surgeon: Amada Backer, MD;  Location: [facility];  Service: Orthopedics;  Laterality: Right;   CERVICAL CONE BIOPSY     CHOLECYSTECTOMY  1980   COLON RESECTION  2002   COLON SURGERY  2003   removed 2 feet colon   ESOPHAGEAL MANOMETRY N/A 04/24/2016   Procedure: ESOPHAGEAL MANOMETRY (EM);  Surgeon: Sergio Dandy, MD;  Location: [facility];  Service: Endoscopy;  Laterality: N/A;   HAND SURGERY     right/ thumb joints replaced both hands   OOPHORECTOMY     right foot surgery Right 08/15/2022   SHOULDER ARTHROSCOPY  02/28/2022  TOTAL SHOULDER REPLACEMENT Right 06/19/2022   TUBAL LIGATION      Prior to Admission medications   Medication Sig Start Date End Date Taking? Authorizing Provider  FLUoxetine (PROZAC) 20 MG capsule Take 1 capsule (20 mg total) by mouth daily. 02/27/23  Yes Tabori, Katherine E, MD  furosemide (LASIX) 20 MG tablet TAKE 1 TABLET BY MOUTH EVERY DAY 03/03/23  Yes Tabori, Katherine E, MD  KLOR-CON M20 20 MEQ tablet TAKE 1 TABLET BY MOUTH EVERY DAY 05/30/23  Yes Tabori, Katherine E, MD  latanoprost  (XALATAN) 0.005 % ophthalmic solution Place 1 drop into both eyes at bedtime.  10/29/13  Yes [provider]  losartan-hydrochlorothiazide (HYZAAR) 50-12.5 MG tablet TAKE 1 TABLET BY MOUTH EVERY DAY 03/09/23  Yes Tabori, Katherine E, MD  meloxicam (MOBIC) 15 MG tablet Take 15 mg by mouth daily. 06/08/23  Yes [provider]  naproxen (NAPROSYN) 500 MG tablet Take 500 mg by mouth 2 (two) times daily. 02/21/23  Yes [provider]  omeprazole (PRILOSEC) 20 MG capsule TAKE 1 CAPSULE 2 TIMES DAILY 30-60 MINUTES BEFORE BREAKFAST AND DINNER. 05/30/23  Yes Graciella Lavender, PA  pramipexole (MIRAPEX) 1 MG tablet Take 1 tablet (1 mg total) by mouth 2 (two) times daily. Patient taking differently: Take 2 mg by mouth at bedtime. 06/14/23  Yes [name], [name], NP  tiZANidine (ZANAFLEX) 4 MG tablet TAKE 1 TABLET BY MOUTH 3 TIMES DAILY. Patient taking differently: Take 8 mg by mouth at bedtime. 06/15/23  Yes Tabori, Katherine E, MD  Vitamin D, Ergocalciferol, (DRISDOL) 1.25 MG (50000 UNIT) CAPS capsule Take 50,000 Units by mouth once a week. 01/31/23  Yes [provider]    Allergies  Allergen Reactions   Nitrofurantoin     Makes mouth break out/blisters   Propoxyphene N-Acetaminophen     REACTION: nausea   Nexium [Esomeprazole Magnesium] Nausea And Vomiting   Nortriptyline Hcl Nausea And Vomiting   Hydrocodone Bit-Homatrop Mbr Itching and Rash   Morphine Itching and Rash    Social History   Socioeconomic History   Marital status: Widowed    Spouse name: Not on file   Number of children: 1   Years of education: Not on file   Highest education level: Not on file  Occupational History   Occupation: Retired  Tobacco Use   Smoking status: Never   Smokeless tobacco: Never  Vaping Use   Vaping status: Never Used  Substance and Sexual Activity   Alcohol use: No   Drug use: No   Sexual activity: Not Currently  Other Topics Concern   Not on file  Social History  Narrative   Lives with husband floyd   Drinks maybe 4 sodas a week   Retired    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 Strain: Low Risk  (06/28/2022)   Overall Financial Resource Strain (CARDIA)    Difficulty of Paying Living Expenses: Not hard at all  Food Insecurity: No Food Insecurity (06/28/2022)   Hunger Vital Sign    Worried About Running Out of Food in the Last Year: Never true    Ran Out of Food in the Last Year: Never true  Transportation Needs: No Transportation Needs (06/28/2022)   PRAPARE - Administrator, Civil Service (Medical): No    Lack of Transportation (Non-Medical): No  Physical Activity: Inactive (06/28/2022)   Exercise Vital Sign    Days of Exercise per Week: 0 days    Minutes of Exercise per  Session: 0 min  Stress: No Stress Concern Present (06/28/2022)   [employer] of Occupational Health - Occupational Stress Questionnaire    Feeling of Stress : Not at all  Social Connections: Moderately Integrated (06/28/2022)   Social Connection and Isolation Panel [NHANES]    Frequency of Communication with Friends and Family: More than three times a week    Frequency of Social Gatherings with Friends and Family: Three times a week    Attends Religious Services: More than 4 times per year    Active Member of Clubs or Organizations: Yes    Attends Banker Meetings: More than 4 times per year    Marital Status: Widowed  Intimate Partner Violence: Not At Risk (06/28/2022)   Humiliation, Afraid, Rape, and Kick questionnaire    Fear of Current or Ex-Partner: No    Emotionally Abused: No    Physically Abused: No    Sexually Abused: No    Tobacco Use: Low Risk  (06/15/2023)   Patient History    Smoking Tobacco Use: Never    Smokeless Tobacco Use: Never    Passive Exposure: Not on file   Social History   Substance and Sexual Activity  Alcohol Use No    Family History  Problem Relation Age of Onset   Diabetes Mother    Stroke  Mother        brother, sister, MGM   Colon polyps Mother    Heart disease Father    Kidney disease Father    Colon polyps Father    Lung disease Father    Kidney cancer Sister    Stroke Sister    Diabetes Sister    Colon polyps Sister    Heart attack Paternal Grandfather    Celiac disease Other        niece   Colon cancer Neg Hx    Esophageal cancer Neg Hx    Rectal cancer Neg Hx    Stomach cancer Neg Hx    Restless legs syndrome Neg Hx    Neuropathy Neg Hx     Review of Systems  Constitutional:  Negative for chills and fever.  HENT:  Negative for congestion, sore throat and tinnitus.   Eyes:  Negative for double vision, photophobia and pain.  Respiratory:  Negative for cough, shortness of breath and wheezing.   Cardiovascular:  Negative for chest pain, palpitations and orthopnea.  Gastrointestinal:  Negative for heartburn, nausea and vomiting.  Genitourinary:  Negative for dysuria, frequency and urgency.  Musculoskeletal:  Positive for joint pain.  Neurological:  Negative for dizziness, weakness and headaches.     Objective:  Physical Exam: Well nourished and well developed.  General: Alert and oriented x3, cooperative and pleasant, no acute distress.  Head: normocephalic, atraumatic, neck supple.  Eyes: EOMI.  Musculoskeletal:  Evaluation of her right hip shows: - Flexion: 100 degrees; Minimal internal rotation: 10-20 degrees; External rotation: 20 degrees - Abduction: 20 degrees - Significant pain on range of motion of the hip - Significant antalgic gait pattern on the right  Calves soft and nontender. Motor function intact in LE. Strength 5/5 LE bilaterally. Neuro: Distal pulses 2+. Sensation to light touch intact in LE.  Imaging Review Plain radiographs demonstrate severe degenerative joint disease of the right hip. The bone quality appears to be adequate for age and reported activity level.  Assessment/Plan:  End stage arthritis, right hip  The  patient history, physical examination, clinical judgement of the provider and imaging studies are  consistent with end stage degenerative joint disease of the right hip and total hip arthroplasty is deemed medically necessary. The treatment options including medical management, injection therapy, arthroscopy and arthroplasty were discussed at length. The risks and benefits of total hip arthroplasty were presented and reviewed. The risks due to aseptic loosening, infection, stiffness, dislocation/subluxation, thromboembolic complications and other imponderables were discussed. The patient acknowledged the explanation, agreed to proceed with the plan and consent was signed. Patient is being admitted for inpatient treatment for surgery, pain control, PT, OT, prophylactic antibiotics, VTE prophylaxis, progressive ambulation and ADLs and discharge planning.The patient is planning to be discharged  home .   Patient's anticipated LOS is less than 2 midnights, meeting these requirements: - Lives within 1 hour of care - Has a competent adult at home to recover with post-op recover - NO history of  - Chronic pain requiring opioids  - Diabetes  - Coronary Artery Disease  - Heart failure  - Heart attack  - Stroke  - DVT/VTE  - Cardiac arrhythmia  - Respiratory Failure/COPD  - Renal failure  - Anemia  - Advanced Liver disease  Therapy Plans: HEP Disposition: Home with sister Planned DVT Prophylaxis: Aspirin 81 mg BID DME Needed: None PCP: Laymon Priest, MD (clearance received) TXA: IV Allergies: Tramadol (severe nightmares), morphine (rash) Anesthesia Concerns: None BMI: 35.7 Last HgbA1c: Not diabetic  Pain Regimen: Hydrocodone Pharmacy: [facility])  Other:  - Cautious with right shoulder placement - hx of 3 previous shoulder surgeries  - Patient was instructed on what medications to stop prior to surgery. - Follow-up visit in 2 weeks with Dr. France Ina - Begin physical therapy  following surgery - Pre-operative lab work as pre-surgical testing - Prescriptions will be provided in hospital at time of discharge  Sharlynn Dear, PA-C Orthopedic Surgery [facility] [place]

## 2023-06-21 ENCOUNTER — Encounter (HOSPITAL_COMMUNITY): Admission: RE | Admit: 2023-06-21 | Source: Ambulatory Visit

## 2023-06-26 ENCOUNTER — Other Ambulatory Visit: Payer: Self-pay | Admitting: Family Medicine

## 2023-06-26 NOTE — Patient Instructions (Addendum)
 SURGICAL WAITING ROOM VISITATION  Patients having surgery or a procedure may have no more than 2 support people in the waiting area - these visitors may rotate.    Children under the age of 71 must have an adult with them who is not the patient.  Due to an increase in RSV and influenza rates and associated hospitalizations, children ages 87 and under may not visit patients in [facility] hospitals.  Visitors with respiratory illnesses are discouraged from visiting and should remain at home.  If the patient needs to stay at the hospital during part of their recovery, the visitor guidelines for inpatient rooms apply. Pre-op nurse will coordinate an appropriate time for 1 support person to accompany patient in pre-op.  This support person may not rotate.    Please refer to the [facility] website for the visitor guidelines for Inpatients (after your surgery is over and you are in a regular room).       Your procedure is scheduled on: 07/02/23   Report to [facility] Main Entrance    Report to admitting at 11:15 AM   Call this number if you have problems the morning of surgery [phone]   Do not eat food :After Midnight.   After Midnight you may have the following liquids until 10:45 AM DAY OF SURGERY  Water Non-Citrus Juices (without pulp, NO RED-Apple, White grape, White cranberry) Black Coffee (NO MILK/CREAM OR CREAMERS, sugar ok)  Clear Tea (NO MILK/CREAM OR CREAMERS, sugar ok) regular and decaf                             Plain Jell-O (NO RED)                                           Fruit ices (not with fruit pulp, NO RED)                                     Popsicles (NO RED)                                                               Sports drinks like Gatorade (NO RED)                  The day of surgery:  Drink ONE (1) Pre-Surgery Clear Ensure at 10:45 AM the morning of surgery. Drink in one sitting. Do not sip.  This drink was given to you during your  hospital  pre-op appointment visit. Nothing else to drink after completing the  Pre-Surgery Clear Ensure.       Oral Hygiene is also important to reduce your risk of infection.                                    Remember - BRUSH YOUR TEETH THE MORNING OF SURGERY WITH YOUR REGULAR TOOTHPASTE  DENTURES WILL BE REMOVED PRIOR TO SURGERY PLEASE DO NOT APPLY "Poly grip" OR ADHESIVES!!!  Stop all vitamins and herbal supplements 7 days before surgery.   Take these medicines the morning of surgery with A SIP OF WATER : Fluoxetine (prozac ), Omeprazole (prilosec)             You may not have any metal on your body including hair pins, jewelry, and body piercing             Do not wear make-up, lotions, powders, perfumes/cologne, or deodorant  Do not wear nail polish including gel and S&S, artificial/acrylic nails, or any other type of covering on natural nails including finger and toenails. If you have artificial nails, gel coating, etc. that needs to be removed by a nail salon please have this removed prior to surgery or surgery may need to be canceled/ delayed if the surgeon/ anesthesia feels like they are unable to be safely monitored.   Do not shave  48 hours prior to surgery.    Do not bring valuables to the hospital. [name] IS NOT             RESPONSIBLE   FOR VALUABLES.   Contacts, glasses, dentures or bridgework may not be worn into surgery.   Bring small overnight bag day of surgery.   DO NOT BRING YOUR HOME MEDICATIONS TO THE HOSPITAL. PHARMACY WILL DISPENSE MEDICATIONS LISTED ON YOUR MEDICATION LIST TO YOU DURING YOUR ADMISSION IN THE HOSPITAL!    Patients discharged on the day of surgery will not be allowed to drive home.  Someone NEEDS to stay with you for the first 24 hours after anesthesia.   Special Instructions: Bring a copy of your healthcare power of attorney and living will documents the day of surgery if you haven't scanned them before.              Please read over  the following fact sheets you were given: IF YOU HAVE QUESTIONS ABOUT YOUR PRE-OP INSTRUCTIONS PLEASE CALL [phone] Andrea Byrd   If you received a COVID test during your pre-op visit  it is requested that you wear a mask when out in public, stay away from anyone that may not be feeling well and notify your surgeon if you develop symptoms. If you test positive for Covid or have been in contact with anyone that has tested positive in the last 10 days please notify you surgeon.      Pre-operative 5 CHG Bath Instructions   You can play a key role in reducing the risk of infection after surgery. Your skin needs to be as free of germs as possible. You can reduce the number of germs on your skin by washing with CHG (chlorhexidine  gluconate) soap before surgery. CHG is an antiseptic soap that kills germs and continues to kill germs even after washing.   DO NOT use if you have an allergy to chlorhexidine /CHG or antibacterial soaps. If your skin becomes reddened or irritated, stop using the CHG and notify one of our RNs at [phone].   Please shower with the CHG soap starting 4 days before surgery using the following schedule:     Please keep in mind the following:  DO NOT shave, including legs and underarms, starting the day of your first shower.   You may shave your face at any point before/day of surgery.  Place clean sheets on your bed the day you start using CHG soap. Use a clean washcloth (not used since being washed) for each shower. DO NOT sleep with pets once you start using the CHG.  CHG Shower Instructions:  If you choose to wash your hair and private area, wash first with your normal shampoo/soap.  After you use shampoo/soap, rinse your hair and body thoroughly to remove shampoo/soap residue.  Turn the water OFF and apply about 3 tablespoons (45 ml) of CHG soap to a CLEAN washcloth.  Apply CHG soap ONLY FROM YOUR NECK DOWN TO YOUR TOES (washing for 3-5 minutes)  DO NOT use CHG soap  on face, private areas, open wounds, or sores.  Pay special attention to the area where your surgery is being performed.  If you are having back surgery, having someone wash your back for you may be helpful. Wait 2 minutes after CHG soap is applied, then you may rinse off the CHG soap.  Pat dry with a clean towel  Put on clean clothes/pajamas   If you choose to wear lotion, please use ONLY the CHG-compatible lotions on the back of this paper.     Additional instructions for the day of surgery: DO NOT APPLY any lotions, deodorants, cologne, or perfumes.   Put on clean/comfortable clothes.  Brush your teeth.  Ask your nurse before applying any prescription medications to the skin.      CHG Compatible Lotions   Aveeno Moisturizing lotion  Cetaphil Moisturizing Cream  Cetaphil Moisturizing Lotion  Clairol Herbal Essence Moisturizing Lotion, Dry Skin  Clairol Herbal Essence Moisturizing Lotion, Extra Dry Skin  Clairol Herbal Essence Moisturizing Lotion, Normal Skin  Curel Age Defying Therapeutic Moisturizing Lotion with Alpha Hydroxy  Curel Extreme Care Body Lotion  Curel Soothing Hands Moisturizing Hand Lotion  Curel Therapeutic Moisturizing Cream, Fragrance-Free  Curel Therapeutic Moisturizing Lotion, Fragrance-Free  Curel Therapeutic Moisturizing Lotion, Original Formula  Eucerin Daily Replenishing Lotion  Eucerin Dry Skin Therapy Plus Alpha Hydroxy Crme  Eucerin Dry Skin Therapy Plus Alpha Hydroxy Lotion  Eucerin Original Crme  Eucerin Original Lotion  Eucerin Plus Crme Eucerin Plus Lotion  Eucerin TriLipid Replenishing Lotion  Keri Anti-Bacterial Hand Lotion  Keri Deep Conditioning Original Lotion Dry Skin Formula Softly Scented  Keri Deep Conditioning Original Lotion, Fragrance Free Sensitive Skin Formula  Keri Lotion Fast Absorbing Fragrance Free Sensitive Skin Formula  Keri Lotion Fast Absorbing Softly Scented Dry Skin Formula  Keri Original Lotion  Keri Skin  Renewal Lotion Keri Silky Smooth Lotion  Keri Silky Smooth Sensitive Skin Lotion  Nivea Body Creamy Conditioning Oil  Nivea Body Extra Enriched Lotion  Nivea Body Original Lotion  Nivea Body Sheer Moisturizing Lotion Nivea Crme  Nivea Skin Firming Lotion  NutraDerm 30 Skin Lotion  NutraDerm Skin Lotion  NutraDerm Therapeutic Skin Cream  NutraDerm Therapeutic Skin Lotion  ProShield Protective Hand Cream   Incentive Spirometer  An incentive spirometer is a tool that can help keep your lungs clear and active. This tool measures how well you are filling your lungs with each breath. Taking long deep breaths may help reverse or decrease the chance of developing breathing (pulmonary) problems (especially infection) following: A long period of time when you are unable to move or be active. BEFORE THE PROCEDURE  If the spirometer includes an indicator to show your best effort, your nurse or respiratory therapist will set it to a desired goal. If possible, sit up straight or lean slightly forward. Try not to slouch. Hold the incentive spirometer in an upright position. INSTRUCTIONS FOR USE  Sit on the edge of your bed if possible, or sit up as far as you can in bed or on a chair. Hold  the incentive spirometer in an upright position. Breathe out normally. Place the mouthpiece in your mouth and seal your lips tightly around it. Breathe in slowly and as deeply as possible, raising the piston or the ball toward the top of the column. Hold your breath for 3-5 seconds or for as long as possible. Allow the piston or ball to fall to the bottom of the column. Remove the mouthpiece from your mouth and breathe out normally. Rest for a few seconds and repeat Steps 1 through 7 at least 10 times every 1-2 hours when you are awake. Take your time and take a few normal breaths between deep breaths. The spirometer may include an indicator to show your best effort. Use the indicator as a goal to work toward  during each repetition. After each set of 10 deep breaths, practice coughing to be sure your lungs are clear. If you have an incision (the cut made at the time of surgery), support your incision when coughing by placing a pillow or rolled up towels firmly against it. Once you are able to get out of bed, walk around indoors and cough well. You may stop using the incentive spirometer when instructed by your caregiver.  RISKS AND COMPLICATIONS Take your time so you do not get dizzy or light-headed. If you are in pain, you may need to take or ask for pain medication before doing incentive spirometry. It is harder to take a deep breath if you are having pain. AFTER USE Rest and breathe slowly and easily. It can be helpful to keep track of a log of your progress. Your caregiver can provide you with a simple table to help with this. If you are using the spirometer at home, follow these instructions: SEEK MEDICAL CARE IF:  You are having difficultly using the spirometer. You have trouble using the spirometer as often as instructed. Your pain medication is not giving enough relief while using the spirometer. You develop fever of 100.5 F (38.1 C) or higher. SEEK IMMEDIATE MEDICAL CARE IF:  You cough up bloody sputum that had not been present before. You develop fever of 102 F (38.9 C) or greater. You develop worsening pain at or near the incision site. MAKE SURE YOU:  Understand these instructions. Will watch your condition. Will get help right away if you are not doing well or get worse. Document Released: 07/03/2006 Document Revised: 05/15/2011 Document Reviewed: 09/03/2006 [facility] Patient Information 2014 [place], [place].

## 2023-06-26 NOTE — Progress Notes (Signed)
 COVID Vaccine received:  []  No [x]  Yes Date of any COVID positive Test in last 90 days: no PCP - Laymon Priest MD Cardiologist - n/a  Chest x-ray -  EKG -  06/26/23 Epic Stress Test -  ECHO - 01/15/17 Epic Cardiac Cath -   Bowel Prep - [x]  No  []   Yes ______  Pacemaker / ICD device [x]  No []  Yes   Spinal Cord Stimulator:[x]  No []  Yes       History of Sleep Apnea? [x]  No []  Yes   CPAP used?- [x]  No []  Yes    Does the patient monitor blood sugar?          [x]  No []  Yes  []  N/A  Patient has: [x]  NO Hx DM   []  Pre-DM                 []  DM1  []   DM2 Does patient have a [employer] or Dexacom? []  No []  Yes   Fasting Blood Sugar Ranges-  Checks Blood Sugar _____ times a day  GLP1 agonist / usual dose - no GLP1 instructions:  SGLT-2 inhibitors / usual dose - no SGLT-2 instructions:   Blood Thinner / Instructions:no Aspirin  Instructions:no  Comments:   Activity level: Patient is able  to climb a flight of stairs without difficulty; [x]  No CP  [x]  No SOB, _   Patient can perform ADLs without assistance.   Anesthesia review:   Patient denies shortness of breath, fever, cough and chest pain at PAT appointment.  Patient verbalized understanding and agreement to the Pre-Surgical Instructions that were given to them at this PAT appointment. Patient was also educated of the need to review these PAT instructions again prior to his/her surgery.I reviewed the appropriate phone numbers to call if they have any and questions or concerns.

## 2023-06-28 ENCOUNTER — Other Ambulatory Visit: Payer: Self-pay

## 2023-06-28 ENCOUNTER — Encounter (HOSPITAL_COMMUNITY): Payer: Self-pay

## 2023-06-28 ENCOUNTER — Encounter (HOSPITAL_COMMUNITY)
Admission: RE | Admit: 2023-06-28 | Discharge: 2023-06-28 | Disposition: A | Discharge status: Home / Self Care | Source: Ambulatory Visit | Attending: Orthopedic Surgery | Admitting: Orthopedic Surgery

## 2023-06-28 VITALS — BP 153/82 | HR 79 | Temp 98.2°F | Resp 16 | Ht 61.0 in | Wt 195.0 lb

## 2023-06-28 DIAGNOSIS — I10 Essential (primary) hypertension: Secondary | ICD-10-CM

## 2023-06-28 DIAGNOSIS — Z01818 Encounter for other preprocedural examination: Secondary | ICD-10-CM | POA: Insufficient documentation

## 2023-06-28 HISTORY — DX: Headache, unspecified: R51.9

## 2023-06-28 LAB — CBC
HCT: 34.2 % — ABNORMAL LOW (ref 36.0–46.0)
Hemoglobin: 10 g/dL — ABNORMAL LOW (ref 12.0–15.0)
MCH: 24.4 pg — ABNORMAL LOW (ref 26.0–34.0)
MCHC: 29.2 g/dL — ABNORMAL LOW (ref 30.0–36.0)
MCV: 83.4 fL (ref 80.0–100.0)
Platelets: 375 10*3/uL (ref 150–400)
RBC: 4.1 MIL/uL (ref 3.87–5.11)
RDW: 15.9 % — ABNORMAL HIGH (ref 11.5–15.5)
WBC: 7.6 10*3/uL (ref 4.0–10.5)
nRBC: 0 % (ref 0.0–0.2)

## 2023-06-28 LAB — BASIC METABOLIC PANEL WITH GFR
Anion gap: 9 (ref 5–15)
BUN: 19 mg/dL (ref 8–23)
CO2: 29 mmol/L (ref 22–32)
Calcium: 9.7 mg/dL (ref 8.9–10.3)
Chloride: 100 mmol/L (ref 98–111)
Creatinine, Ser: 0.92 mg/dL (ref 0.44–1.00)
GFR, Estimated: >60 mL/min (ref >60)
Glucose, Bld: 95 mg/dL (ref 70–99)
Potassium: 3.8 mmol/L (ref 3.5–5.1)
Sodium: 138 mmol/L (ref 135–145)

## 2023-06-28 LAB — SURGICAL PCR SCREEN
MRSA, PCR: NEGATIVE
Staphylococcus aureus: NEGATIVE

## 2023-07-02 ENCOUNTER — Observation Stay (HOSPITAL_COMMUNITY)
Admission: RE | Admit: 2023-07-02 | Discharge: 2023-07-05 | Disposition: A | Discharge status: Home / Self Care | Attending: Orthopedic Surgery | Admitting: Orthopedic Surgery

## 2023-07-02 ENCOUNTER — Observation Stay (HOSPITAL_COMMUNITY)

## 2023-07-02 ENCOUNTER — Encounter (HOSPITAL_COMMUNITY)
Admission: RE | Disposition: A | Discharge status: Home / Self Care | Payer: Self-pay | Source: Home / Self Care | Attending: Orthopedic Surgery

## 2023-07-02 ENCOUNTER — Other Ambulatory Visit: Payer: Self-pay

## 2023-07-02 ENCOUNTER — Ambulatory Visit (HOSPITAL_COMMUNITY)

## 2023-07-02 ENCOUNTER — Encounter (HOSPITAL_COMMUNITY): Payer: Self-pay | Admitting: Orthopedic Surgery

## 2023-07-02 ENCOUNTER — Ambulatory Visit (HOSPITAL_COMMUNITY): Admitting: Anesthesiology

## 2023-07-02 DIAGNOSIS — Z96611 Presence of right artificial shoulder joint: Secondary | ICD-10-CM | POA: Diagnosis not present

## 2023-07-02 DIAGNOSIS — Z471 Aftercare following joint replacement surgery: Secondary | ICD-10-CM | POA: Diagnosis not present

## 2023-07-02 DIAGNOSIS — M1611 Unilateral primary osteoarthritis, right hip: Secondary | ICD-10-CM

## 2023-07-02 DIAGNOSIS — I10 Essential (primary) hypertension: Secondary | ICD-10-CM

## 2023-07-02 DIAGNOSIS — Z8541 Personal history of malignant neoplasm of cervix uteri: Secondary | ICD-10-CM | POA: Diagnosis not present

## 2023-07-02 DIAGNOSIS — M169 Osteoarthritis of hip, unspecified: Principal | ICD-10-CM | POA: Diagnosis present

## 2023-07-02 DIAGNOSIS — Z96641 Presence of right artificial hip joint: Secondary | ICD-10-CM | POA: Diagnosis not present

## 2023-07-02 DIAGNOSIS — E669 Obesity, unspecified: Secondary | ICD-10-CM

## 2023-07-02 DIAGNOSIS — Z79899 Other long term (current) drug therapy: Secondary | ICD-10-CM | POA: Insufficient documentation

## 2023-07-02 DIAGNOSIS — Z6836 Body mass index (BMI) 36.0-36.9, adult: Secondary | ICD-10-CM | POA: Diagnosis not present

## 2023-07-02 DIAGNOSIS — Z01818 Encounter for other preprocedural examination: Secondary | ICD-10-CM

## 2023-07-02 HISTORY — PX: TOTAL HIP ARTHROPLASTY: SHX124

## 2023-07-02 LAB — ABO/RH: ABO/RH(D): B NEG

## 2023-07-02 SURGERY — ARTHROPLASTY, HIP, TOTAL, ANTERIOR APPROACH
Anesthesia: Spinal | Site: Hip | Laterality: Right

## 2023-07-02 MED ORDER — PHENOL 1.4 % MT LIQD: 1.0000 | OROMUCOSAL | Status: DC | PRN

## 2023-07-02 MED ORDER — LACTATED RINGERS IV SOLN: INTRAVENOUS | Status: DC | PRN

## 2023-07-02 MED ORDER — METOCLOPRAMIDE HCL 5 MG/ML IJ SOLN: 5.0000 mg | Freq: Three times a day (TID) | INTRAMUSCULAR | Status: DC | PRN

## 2023-07-02 MED ORDER — DEXAMETHASONE SODIUM PHOSPHATE 10 MG/ML IJ SOLN
8.0000 mg | Freq: Once | INTRAMUSCULAR | Status: AC
  Administered 2023-07-02: 8 mg via INTRAVENOUS

## 2023-07-02 MED ORDER — ACETAMINOPHEN 10 MG/ML IV SOLN
1000.0000 mg | Freq: Four times a day (QID) | INTRAVENOUS | Status: DC
  Administered 2023-07-02: 1000 mg via INTRAVENOUS
  Filled 2023-07-02: qty 100

## 2023-07-02 MED ORDER — LOSARTAN POTASSIUM 50 MG PO TABS
50.0000 mg | ORAL_TABLET | Freq: Every day | ORAL | Status: DC
Start: 2023-07-03 — End: 2023-07-05
  Administered 2023-07-03 – 2023-07-05 (×3): 50 mg via ORAL
  Filled 2023-07-02 (×3): qty 1

## 2023-07-02 MED ORDER — HYDROMORPHONE HCL 1 MG/ML IJ SOLN
0.5000 mg | INTRAMUSCULAR | Status: DC | PRN
  Administered 2023-07-02 – 2023-07-03 (×3): 1 mg via INTRAVENOUS
  Administered 2023-07-03: 0.5 mg via INTRAVENOUS
  Filled 2023-07-02 (×4): qty 1

## 2023-07-02 MED ORDER — HYDROCODONE-ACETAMINOPHEN 7.5-325 MG PO TABS
1.0000 | ORAL_TABLET | ORAL | Status: DC | PRN
  Administered 2023-07-02 – 2023-07-03 (×5): 1 via ORAL
  Filled 2023-07-02: qty 1
  Filled 2023-07-02: qty 2
  Filled 2023-07-02 (×3): qty 1

## 2023-07-02 MED ORDER — POVIDONE-IODINE 10 % EX SWAB: 2.0000 | Freq: Once | CUTANEOUS | Status: DC

## 2023-07-02 MED ORDER — TRANEXAMIC ACID-NACL 1000-0.7 MG/100ML-% IV SOLN
1000.0000 mg | INTRAVENOUS | Status: AC
  Administered 2023-07-02: 1000 mg via INTRAVENOUS
  Filled 2023-07-02: qty 100

## 2023-07-02 MED ORDER — ONDANSETRON HCL 4 MG PO TABS: 4.0000 mg | ORAL_TABLET | Freq: Four times a day (QID) | ORAL | Status: DC | PRN

## 2023-07-02 MED ORDER — CHLORHEXIDINE GLUCONATE 0.12 % MT SOLN
15.0000 mL | Freq: Once | OROMUCOSAL | Status: AC
  Administered 2023-07-02: 15 mL via OROMUCOSAL

## 2023-07-02 MED ORDER — CEFAZOLIN SODIUM-DEXTROSE 2-4 GM/100ML-% IV SOLN
2.0000 g | Freq: Four times a day (QID) | INTRAVENOUS | Status: AC
  Administered 2023-07-02 – 2023-07-03 (×2): 2 g via INTRAVENOUS
  Filled 2023-07-02 (×2): qty 100

## 2023-07-02 MED ORDER — METOCLOPRAMIDE HCL 5 MG PO TABS: 5.0000 mg | ORAL_TABLET | Freq: Three times a day (TID) | ORAL | Status: DC | PRN

## 2023-07-02 MED ORDER — CEFAZOLIN SODIUM-DEXTROSE 2-4 GM/100ML-% IV SOLN
2.0000 g | INTRAVENOUS | Status: AC
  Administered 2023-07-02: 2 g via INTRAVENOUS
  Filled 2023-07-02: qty 100

## 2023-07-02 MED ORDER — ALBUMIN HUMAN 5 % IV SOLN
12.5000 g | Freq: Once | INTRAVENOUS | Status: AC
  Administered 2023-07-02: 12.5 g via INTRAVENOUS

## 2023-07-02 MED ORDER — BUPIVACAINE-EPINEPHRINE (PF) 0.25% -1:200000 IJ SOLN
INTRAMUSCULAR | Status: AC
  Filled 2023-07-02: qty 30

## 2023-07-02 MED ORDER — METHOCARBAMOL 1000 MG/10ML IJ SOLN: 500.0000 mg | Freq: Four times a day (QID) | INTRAMUSCULAR | Status: DC | PRN

## 2023-07-02 MED ORDER — PROPOFOL 500 MG/50ML IV EMUL
INTRAVENOUS | Status: DC | PRN
  Administered 2023-07-02: 120 ug/kg/min via INTRAVENOUS

## 2023-07-02 MED ORDER — ACETAMINOPHEN 500 MG PO TABS
500.0000 mg | ORAL_TABLET | Freq: Four times a day (QID) | ORAL | Status: AC
  Administered 2023-07-03: 500 mg via ORAL
  Filled 2023-07-02 (×2): qty 1

## 2023-07-02 MED ORDER — FENTANYL CITRATE PF 50 MCG/ML IJ SOSY: 25.0000 ug | PREFILLED_SYRINGE | INTRAMUSCULAR | Status: DC | PRN

## 2023-07-02 MED ORDER — ONDANSETRON HCL 4 MG/2ML IJ SOLN: 4.0000 mg | Freq: Four times a day (QID) | INTRAMUSCULAR | Status: DC | PRN

## 2023-07-02 MED ORDER — MAGNESIUM CITRATE PO SOLN: 1.0000 | Freq: Once | ORAL | Status: DC | PRN

## 2023-07-02 MED ORDER — LOSARTAN POTASSIUM-HCTZ 50-12.5 MG PO TABS
1.0000 | ORAL_TABLET | Freq: Every day | ORAL | Status: DC
Start: 2023-07-02 — End: 2023-07-02

## 2023-07-02 MED ORDER — SODIUM CHLORIDE 0.9 % IV SOLN: INTRAVENOUS | Status: DC

## 2023-07-02 MED ORDER — METHOCARBAMOL 500 MG PO TABS
500.0000 mg | ORAL_TABLET | Freq: Four times a day (QID) | ORAL | Status: DC | PRN
  Administered 2023-07-02 – 2023-07-05 (×5): 500 mg via ORAL
  Filled 2023-07-02 (×5): qty 1

## 2023-07-02 MED ORDER — HYDROCODONE-ACETAMINOPHEN 5-325 MG PO TABS
1.0000 | ORAL_TABLET | ORAL | Status: DC | PRN
  Administered 2023-07-03 – 2023-07-04 (×3): 2 via ORAL
  Administered 2023-07-04: 1 via ORAL
  Administered 2023-07-04: 2 via ORAL
  Administered 2023-07-04: 1 via ORAL
  Administered 2023-07-04: 2 via ORAL
  Administered 2023-07-05: 1 via ORAL
  Filled 2023-07-02 (×4): qty 2
  Filled 2023-07-02 (×2): qty 1
  Filled 2023-07-02: qty 2
  Filled 2023-07-02: qty 1
  Filled 2023-07-02: qty 2
  Filled 2023-07-02: qty 1

## 2023-07-02 MED ORDER — POLYETHYLENE GLYCOL 3350 17 G PO PACK: 17.0000 g | PACK | Freq: Every day | ORAL | Status: DC | PRN

## 2023-07-02 MED ORDER — LACTATED RINGERS IV SOLN: INTRAVENOUS | Status: DC

## 2023-07-02 MED ORDER — PHENYLEPHRINE HCL-NACL 20-0.9 MG/250ML-% IV SOLN
INTRAVENOUS | Status: DC | PRN
  Administered 2023-07-02: 50 ug/min via INTRAVENOUS

## 2023-07-02 MED ORDER — ORAL CARE MOUTH RINSE: 15.0000 mL | Freq: Once | OROMUCOSAL | Status: AC

## 2023-07-02 MED ORDER — MORPHINE SULFATE (PF) 2 MG/ML IV SOLN: 0.5000 mg | INTRAVENOUS | Status: DC | PRN

## 2023-07-02 MED ORDER — DOCUSATE SODIUM 100 MG PO CAPS
100.0000 mg | ORAL_CAPSULE | Freq: Two times a day (BID) | ORAL | Status: DC
  Administered 2023-07-02 – 2023-07-05 (×6): 100 mg via ORAL
  Filled 2023-07-02 (×6): qty 1

## 2023-07-02 MED ORDER — 0.9 % SODIUM CHLORIDE (POUR BTL) OPTIME
TOPICAL | Status: DC | PRN
  Administered 2023-07-02: 1000 mL

## 2023-07-02 MED ORDER — POTASSIUM CHLORIDE CRYS ER 20 MEQ PO TBCR
20.0000 meq | EXTENDED_RELEASE_TABLET | Freq: Every day | ORAL | Status: DC
  Administered 2023-07-03 – 2023-07-05 (×3): 20 meq via ORAL
  Filled 2023-07-02 (×3): qty 1

## 2023-07-02 MED ORDER — ONDANSETRON HCL 4 MG/2ML IJ SOLN
INTRAMUSCULAR | Status: DC | PRN
  Administered 2023-07-02: 4 mg via INTRAVENOUS

## 2023-07-02 MED ORDER — ALBUMIN HUMAN 5 % IV SOLN
INTRAVENOUS | Status: AC
  Filled 2023-07-02: qty 250

## 2023-07-02 MED ORDER — BISACODYL 10 MG RE SUPP: 10.0000 mg | Freq: Every day | RECTAL | Status: DC | PRN

## 2023-07-02 MED ORDER — DEXAMETHASONE SODIUM PHOSPHATE 10 MG/ML IJ SOLN
10.0000 mg | Freq: Once | INTRAMUSCULAR | Status: AC
  Administered 2023-07-03: 10 mg via INTRAVENOUS
  Filled 2023-07-02: qty 1

## 2023-07-02 MED ORDER — ASPIRIN 81 MG PO CHEW
81.0000 mg | CHEWABLE_TABLET | Freq: Two times a day (BID) | ORAL | Status: DC
  Administered 2023-07-03 – 2023-07-05 (×5): 81 mg via ORAL
  Filled 2023-07-02 (×5): qty 1

## 2023-07-02 MED ORDER — PROPOFOL 10 MG/ML IV BOLUS
INTRAVENOUS | Status: DC | PRN
  Administered 2023-07-02: 50 mg via INTRAVENOUS
  Administered 2023-07-02: 40 mg via INTRAVENOUS
  Administered 2023-07-02 (×2): 30 mg via INTRAVENOUS

## 2023-07-02 MED ORDER — HYDROCHLOROTHIAZIDE 12.5 MG PO TABS
12.5000 mg | ORAL_TABLET | Freq: Every day | ORAL | Status: DC
  Administered 2023-07-03 – 2023-07-05 (×3): 12.5 mg via ORAL
  Filled 2023-07-02 (×3): qty 1

## 2023-07-02 MED ORDER — PRAMIPEXOLE DIHYDROCHLORIDE 0.25 MG PO TABS
2.0000 mg | ORAL_TABLET | Freq: Every evening | ORAL | Status: DC
Start: 2023-07-02 — End: 2023-07-05
  Administered 2023-07-02 – 2023-07-04 (×3): 2 mg via ORAL
  Filled 2023-07-02 (×3): qty 8

## 2023-07-02 MED ORDER — LATANOPROST 0.005 % OP SOLN
1.0000 [drp] | Freq: Every day | OPHTHALMIC | Status: DC
  Administered 2023-07-02 – 2023-07-04 (×3): 1 [drp] via OPHTHALMIC
  Filled 2023-07-02: qty 2.5

## 2023-07-02 MED ORDER — WATER FOR IRRIGATION, STERILE IR SOLN
Status: DC | PRN
  Administered 2023-07-02: 1000 mL

## 2023-07-02 MED ORDER — MENTHOL 3 MG MT LOZG: 1.0000 | LOZENGE | OROMUCOSAL | Status: DC | PRN

## 2023-07-02 MED ORDER — PANTOPRAZOLE SODIUM 40 MG PO TBEC
40.0000 mg | DELAYED_RELEASE_TABLET | Freq: Every day | ORAL | Status: DC
  Administered 2023-07-03 – 2023-07-05 (×3): 40 mg via ORAL
  Filled 2023-07-02 (×3): qty 1

## 2023-07-02 MED ORDER — FLUOXETINE HCL 20 MG PO CAPS
20.0000 mg | ORAL_CAPSULE | Freq: Every day | ORAL | Status: DC
  Administered 2023-07-03 – 2023-07-05 (×3): 20 mg via ORAL
  Filled 2023-07-02 (×3): qty 1

## 2023-07-02 MED ORDER — BUPIVACAINE-EPINEPHRINE (PF) 0.25% -1:200000 IJ SOLN
INTRAMUSCULAR | Status: DC | PRN
  Administered 2023-07-02: 30 mL

## 2023-07-02 MED ORDER — ACETAMINOPHEN 325 MG PO TABS
325.0000 mg | ORAL_TABLET | Freq: Four times a day (QID) | ORAL | Status: DC | PRN
  Administered 2023-07-05: 650 mg via ORAL
  Filled 2023-07-02: qty 2

## 2023-07-02 MED ORDER — BUPIVACAINE IN DEXTROSE 0.75-8.25 % IT SOLN
INTRATHECAL | Status: DC | PRN
  Administered 2023-07-02: 1.6 mL via INTRATHECAL

## 2023-07-02 SURGICAL SUPPLY — 36 items
BAG COUNTER SPONGE SURGICOUNT (BAG) IMPLANT
BAG ZIPLOCK 12X15 (MISCELLANEOUS) IMPLANT
BLADE SAG 18X100X1.27 (BLADE) ×1 IMPLANT
COVER PERINEAL POST (MISCELLANEOUS) ×1 IMPLANT
COVER SURGICAL LIGHT HANDLE (MISCELLANEOUS) ×1 IMPLANT
CUP ACETBLR 48 OD SECTOR II (Hips) IMPLANT
DERMABOND ADVANCED .7 DNX12 (GAUZE/BANDAGES/DRESSINGS) ×1 IMPLANT
DERMABOND ADVANCED .7 DNX6 (GAUZE/BANDAGES/DRESSINGS) IMPLANT
DRAPE FOOT SWITCH (DRAPES) ×1 IMPLANT
DRAPE STERI IOBAN 125X83 (DRAPES) ×1 IMPLANT
DRAPE U-SHAPE 47X51 STRL (DRAPES) ×2 IMPLANT
DRSG AQUACEL AG ADV 3.5X10 (GAUZE/BANDAGES/DRESSINGS) ×1 IMPLANT
DURAPREP 26ML APPLICATOR (WOUND CARE) ×1 IMPLANT
ELECT REM PT RETURN 15FT ADLT (MISCELLANEOUS) ×1 IMPLANT
FEMORAL STEM 12/14 TPR SZ4 HIP (Orthopedic Implant) IMPLANT
GLOVE BIO SURGEON STRL SZ 6.5 (GLOVE) IMPLANT
GLOVE BIO SURGEON STRL SZ7 (GLOVE) IMPLANT
GLOVE BIO SURGEON STRL SZ8 (GLOVE) ×1 IMPLANT
GLOVE BIOGEL PI IND STRL 7.0 (GLOVE) IMPLANT
GLOVE BIOGEL PI IND STRL 8 (GLOVE) ×1 IMPLANT
GOWN STRL REUS W/ TWL LRG LVL3 (GOWN DISPOSABLE) ×2 IMPLANT
HEAD FEM STD 28X+1.5 STRL (Hips) IMPLANT
HOLDER FOLEY CATH W/STRAP (MISCELLANEOUS) ×1 IMPLANT
KIT TURNOVER KIT A (KITS) IMPLANT
LINER MARATHON 28 48 (Hips) IMPLANT
MANIFOLD NEPTUNE II (INSTRUMENTS) ×1 IMPLANT
PACK ANTERIOR HIP CUSTOM (KITS) ×1 IMPLANT
PENCIL SMOKE EVACUATOR COATED (MISCELLANEOUS) ×1 IMPLANT
SPIKE FLUID TRANSFER (MISCELLANEOUS) ×1 IMPLANT
SUT ETHIBOND NAB CT1 #1 30IN (SUTURE) ×1 IMPLANT
SUT MNCRL AB 4-0 PS2 18 (SUTURE) ×1 IMPLANT
SUT VIC AB 2-0 CT1 TAPERPNT 27 (SUTURE) ×2 IMPLANT
SUTURE STRATFX 0 PDS 27 VIOLET (SUTURE) ×1 IMPLANT
TOWEL GREEN STERILE FF (TOWEL DISPOSABLE) ×1 IMPLANT
TRAY FOLEY MTR SLVR 16FR STAT (SET/KITS/TRAYS/PACK) ×1 IMPLANT
TUBE SUCTION HIGH CAP CLEAR NV (SUCTIONS) ×1 IMPLANT

## 2023-07-02 NOTE — Interval H&P Note (Signed)
 History and Physical Interval Note:  07/02/2023 11:39 AM  Andrea Byrd  has presented today for surgery, with the diagnosis of Right Hip Osteoarthritis.  The various methods of treatment have been discussed with the patient and family. After consideration of risks, benefits and other options for treatment, the patient has consented to  Procedure(s): ARTHROPLASTY, HIP, TOTAL, ANTERIOR APPROACH (Right) as a surgical intervention.  The patient's history has been reviewed, patient examined, no change in status, stable for surgery.  I have reviewed the patient's chart and labs.  Questions were answered to the patient's satisfaction.     Samuel Crock Clarann Helvey

## 2023-07-02 NOTE — Anesthesia Postprocedure Evaluation (Signed)
 Anesthesia Post Note  Patient: MURREL PAVLOFF  Procedure(s) Performed: ARTHROPLASTY, HIP, TOTAL, ANTERIOR APPROACH (Right: Hip)     Patient location during evaluation: PACU Anesthesia Type: Spinal Level of consciousness: oriented and awake and alert Pain management: pain level controlled Vital Signs Assessment: post-procedure vital signs reviewed and stable Respiratory status: spontaneous breathing, respiratory function stable and patient connected to nasal cannula oxygen Cardiovascular status: blood pressure returned to baseline and stable Postop Assessment: no headache, no backache, no apparent nausea or vomiting, spinal receding and patient able to bend at knees Anesthetic complications: no  No notable events documented.  Last Vitals:  Vitals:   07/02/23 1645 07/02/23 1654  BP: (!) 117/58 120/67  Pulse: (!) 57 (!) 59  Resp: 13 16  Temp: (!) 36.4 C (!) 36.4 C  SpO2: 98% 100%    Last Pain:  Vitals:   07/02/23 1645  TempSrc:   PainSc: 0-No pain                 Tishina Lown,W. EDMOND

## 2023-07-02 NOTE — Care Plan (Signed)
 Ortho Bundle Case Management Note  Patient Details  Name: Andrea Byrd MRN: [id number] Date of Birth: April 28, 1945  R THA on 07/02/23  DCP: Home with sister DME: No needs. Has RW PT: HEP                    DME Arranged:  N/A DME Agency:  NA  HH Arranged:    HH Agency:     Additional Comments: Please contact me with any questions of if this plan should need to change.  Bronwen Canon, [place] [facility] [phone]    07/02/2023, 11:00 AM

## 2023-07-02 NOTE — Anesthesia Preprocedure Evaluation (Addendum)
 Anesthesia Evaluation  Patient identified by MRN, date of birth, ID band Patient awake    Reviewed: Allergy & Precautions, H&P , NPO status , Patient's Chart, lab work & pertinent test results  History of Anesthesia Complications (+) PONV and history of anesthetic complications  Airway Mallampati: II  TM Distance: >3 FB Neck ROM: Full    Dental no notable dental hx. (+) Teeth Intact, Dental Advisory Given   Pulmonary neg pulmonary ROS   Pulmonary exam normal breath sounds clear to auscultation       Cardiovascular hypertension, Pt. on medications  Rhythm:Regular Rate:Normal     Neuro/Psych  Headaches  negative psych ROS   GI/Hepatic Neg liver ROS, hiatal hernia,GERD  Medicated,,  Endo/Other    Class 3 obesity  Renal/GU negative Renal ROS  negative genitourinary   Musculoskeletal  (+) Arthritis , Osteoarthritis,    Abdominal   Peds  Hematology  (+) Blood dyscrasia, anemia   Anesthesia Other Findings   Reproductive/Obstetrics negative OB ROS                             Anesthesia Physical Anesthesia Plan  ASA: 3  Anesthesia Plan: Spinal   Post-op Pain Management: Ofirmev  IV (intra-op)*   Induction: Intravenous  PONV Risk Score and Plan: 4 or greater and Ondansetron , Dexamethasone  and Propofol  infusion  Airway Management Planned: Natural Airway and Simple Face Mask  Additional Equipment:   Intra-op Plan:   Post-operative Plan:   Informed Consent: I have reviewed the patients History and Physical, chart, labs and discussed the procedure including the risks, benefits and alternatives for the proposed anesthesia with the patient or authorized representative who has indicated his/her understanding and acceptance.     Dental advisory given  Plan Discussed with: CRNA  Anesthesia Plan Comments:        Anesthesia Quick Evaluation

## 2023-07-02 NOTE — Discharge Instructions (Addendum)
Andrea Gross, MD Total Joint Specialist [facility] [place] [address] [place], [place] [address] [phone]  ANTERIOR APPROACH TOTAL HIP REPLACEMENT POSTOPERATIVE DIRECTIONS     Hip Rehabilitation, Guidelines Following Surgery  The results of a hip operation are greatly improved after range of motion and muscle strengthening exercises. Follow all safety measures which are given to protect your hip. If any of these exercises cause increased pain or swelling in your joint, decrease the amount until you are comfortable again. Then slowly increase the exercises. Call your caregiver if you have problems or questions.   BLOOD CLOT PREVENTION Take 81 mg Aspirin two times a day for three weeks following surgery. Then take an 81 mg Aspirin once a day for three weeks. Then discontinue Aspirin. You may resume your vitamins/supplements upon discharge from the hospital. Do not take any NSAIDs (Advil, Aleve, Ibuprofen, Meloxicam, etc.) for 3 weeks, while taking 81mg  Aspirin twice a day.   HOME CARE INSTRUCTIONS  Remove items at home which could result in a fall. This includes throw rugs or furniture in walking pathways.  ICE to the affected hip as frequently as 20-30 minutes an hour and then as needed for pain and swelling. Continue to use ice on the hip for pain and swelling from surgery. You may notice swelling that will progress down to the foot and ankle. This is normal after surgery. Elevate the leg when you are not up walking on it.   Continue to use the breathing machine which will help keep your temperature down.  It is common for your temperature to cycle up and down following surgery, especially at night when you are not up moving around and exerting yourself.  The breathing machine keeps your lungs expanded and your temperature down.  DIET You may resume your previous home diet once your are discharged from the hospital.  DRESSING / WOUND CARE / SHOWERING You  have an adhesive waterproof bandage over the incision. Leave this in place until your first follow-up appointment. Once you remove this you will not need to place another bandage.  You may begin showering 3 days following surgery, but do not submerge the incision under water.  ACTIVITY For the first 3-5 days, it is important to rest and keep the operative leg elevated. You should, as a general rule, rest for 50 minutes and walk/stretch for 10 minutes per hour. After 5 days, you may slowly increase activity as tolerated.  Perform the exercises you were provided twice a day for about 15-20 minutes each session. Begin these 2 days following surgery. Walk with your walker as instructed. Use the walker until you are comfortable transitioning to a cane. Walk with the cane in the opposite hand of the operative leg. You may discontinue the cane once you are comfortable and walking steadily. Avoid periods of inactivity such as sitting longer than an hour when not asleep. This helps prevent blood clots.  Do not drive a car for 6 weeks or until released by your surgeon.  Do not drive while taking narcotics.  TED HOSE STOCKINGS Wear the elastic stockings on both legs for three weeks following surgery during the day. You may remove them at night while sleeping.  WEIGHT BEARING Weight bearing as tolerated with assist device (walker, cane, etc) as directed, use it as long as suggested by your surgeon or therapist, typically at least 4-6 weeks.  POSTOPERATIVE CONSTIPATION PROTOCOL Constipation - defined medically as fewer than three stools per week and severe  constipation as less than one stool per week.  One of the most common issues patients have following surgery is constipation.  Even if you have a regular bowel pattern at home, your normal regimen is likely to be disrupted due to multiple reasons following surgery.  Combination of anesthesia, postoperative narcotics, change in appetite and fluid intake all  can affect your bowels.  In order to avoid complications following surgery, here are some recommendations in order to help you during your recovery period.  Colace (docusate) - Pick up an over-the-counter form of Colace or another stool softener and take twice a day as long as you are requiring postoperative pain medications.  Take with a full glass of water daily.  If you experience loose stools or diarrhea, hold the colace until you stool forms back up.  If your symptoms do not get better within 1 week or if they get worse, check with your doctor. Dulcolax (bisacodyl) - Pick up over-the-counter and take as directed by the product packaging as needed to assist with the movement of your bowels.  Take with a full glass of water.  Use this product as needed if not relieved by Colace only.  MiraLax (polyethylene glycol) - Pick up over-the-counter to have on hand.  MiraLax is a solution that will increase the amount of water in your bowels to assist with bowel movements.  Take as directed and can mix with a glass of water, juice, soda, coffee, or tea.  Take if you go more than two days without a movement.Do not use MiraLax more than once per day. Call your doctor if you are still constipated or irregular after using this medication for 7 days in a row.  If you continue to have problems with postoperative constipation, please contact the office for further assistance and recommendations.  If you experience "the worst abdominal pain ever" or develop nausea or vomiting, please contact the office immediatly for further recommendations for treatment.  ITCHING  If you experience itching with your medications, try taking only a single pain pill, or even half a pain pill at a time.  You can also use Benadryl over the counter for itching or also to help with sleep.   MEDICATIONS See your medication summary on the "After Visit Summary" that the nursing staff will review with you prior to discharge.  You may have some  home medications which will be placed on hold until you complete the course of blood thinner medication.  It is important for you to complete the blood thinner medication as prescribed by your surgeon.  Continue your approved medications as instructed at time of discharge.  PRECAUTIONS If you experience chest pain or shortness of breath - call 911 immediately for transfer to the hospital emergency department.  If you develop a fever greater that 101 F, purulent drainage from wound, increased redness or drainage from wound, foul odor from the wound/dressing, or calf pain - CONTACT YOUR SURGEON.                                                   FOLLOW-UP APPOINTMENTS Make sure you keep all of your appointments after your operation with your surgeon and caregivers. You should call the office at the above phone number and make an appointment for approximately two weeks after the date of your surgery or  on the date instructed by your surgeon outlined in the "After Visit Summary".  RANGE OF MOTION AND STRENGTHENING EXERCISES  These exercises are designed to help you keep full movement of your hip joint. Follow your caregiver's or physical therapist's instructions. Perform all exercises about fifteen times, three times per day or as directed. Exercise both hips, even if you have had only one joint replacement. These exercises can be done on a training (exercise) mat, on the floor, on a table or on a bed. Use whatever works the best and is most comfortable for you. Use music or television while you are exercising so that the exercises are a pleasant break in your day. This will make your life better with the exercises acting as a break in routine you can look forward to.  Lying on your back, slowly slide your foot toward your buttocks, raising your knee up off the floor. Then slowly slide your foot back down until your leg is straight again.  Lying on your back spread your legs as far apart as you can without  causing discomfort.  Lying on your side, raise your upper leg and foot straight up from the floor as far as is comfortable. Slowly lower the leg and repeat.  Lying on your back, tighten up the muscle in the front of your thigh (quadriceps muscles). You can do this by keeping your leg straight and trying to raise your heel off the floor. This helps strengthen the largest muscle supporting your knee.  Lying on your back, tighten up the muscles of your buttocks both with the legs straight and with the knee bent at a comfortable angle while keeping your heel on the floor.   POST-OPERATIVE OPIOID TAPER INSTRUCTIONS: It is important to wean off of your opioid medication as soon as possible. If you do not need pain medication after your surgery it is ok to stop day one. Opioids include: Codeine, Hydrocodone(Norco, Vicodin), Oxycodone(Percocet, oxycontin) and hydromorphone amongst others.  Long term and even short term use of opiods can cause: Increased pain response Dependence Constipation Depression Respiratory depression And more.  Withdrawal symptoms can include Flu like symptoms Nausea, vomiting And more Techniques to manage these symptoms Hydrate well Eat regular healthy meals Stay active Use relaxation techniques(deep breathing, meditating, yoga) Do Not substitute Alcohol to help with tapering If you have been on opioids for less than two weeks and do not have pain than it is ok to stop all together.  Plan to wean off of opioids This plan should start within one week post op of your joint replacement. Maintain the same interval or time between taking each dose and first decrease the dose.  Cut the total daily intake of opioids by one tablet each day Next start to increase the time between doses. The last dose that should be eliminated is the evening dose.   IF YOU ARE TRANSFERRED TO A SKILLED REHAB FACILITY If the patient is transferred to a skilled rehab facility following release  from the hospital, a list of the current medications will be sent to the facility for the patient to continue.  When discharged from the skilled rehab facility, please have the facility set up the patient's Home Health Physical Therapy prior to being released. Also, the skilled facility will be responsible for providing the patient with their medications at time of release from the facility to include their pain medication, the muscle relaxants, and their blood thinner medication. If the patient is still at the  rehab facility at time of the two week follow up appointment, the skilled rehab facility will also need to assist the patient in arranging follow up appointment in our office and any transportation needs.  MAKE SURE YOU:  Understand these instructions.  Get help right away if you are not doing well or get worse.    DENTAL ANTIBIOTICS:  In most cases prophylactic antibiotics for Dental procdeures after total joint surgery are not necessary.  Exceptions are as follows:  1. History of prior total joint infection  2. Severely immunocompromised (Organ Transplant, cancer chemotherapy, Rheumatoid biologic meds such as Humera)  3. Poorly controlled diabetes (A1C &gt; 8.0, blood glucose over 200)  If you have one of these conditions, contact your surgeon for an antibiotic prescription, prior to your dental procedure.    Pick up stool softner and laxative for home use following surgery while on pain medications. Do not submerge incision under water. Please use good hand washing techniques while changing dressing each day. May shower starting three days after surgery. Please use a clean towel to pat the incision dry following showers. Continue to use ice for pain and swelling after surgery. Do not use any lotions or creams on the incision until instructed by your surgeon.

## 2023-07-02 NOTE — Op Note (Signed)
 OPERATIVE REPORT- TOTAL HIP ARTHROPLASTY   PREOPERATIVE DIAGNOSIS: Osteoarthritis of the Right hip.   POSTOPERATIVE DIAGNOSIS: Osteoarthritis of the Right  hip.   PROCEDURE: Right total hip arthroplasty, anterior approach.   SURGEON: Liliane Rei, MD   ASSISTANT: Angelo Kennedy, PA-C  ANESTHESIA:  Spinal  ESTIMATED BLOOD LOSS:-750 mL    DRAINS: None  COMPLICATIONS: None   CONDITION: PACU - hemodynamically stable.   BRIEF CLINICAL NOTE: Andrea Byrd is a 78 y.o. female who has advanced end-  stage arthritis of their Right  hip with progressively worsening pain and  dysfunction.The patient has failed nonoperative management and presents for  total hip arthroplasty.   PROCEDURE IN DETAIL: After successful administration of spinal  anesthetic, the traction boots for the [facility] bed were placed on both  feet and the patient was placed onto the [facility] bed, boots placed into the leg  holders. The Right hip was then isolated from the perineum with plastic  drapes and prepped and draped in the usual sterile fashion. ASIS and  greater trochanter were marked and a oblique incision was made, starting  at about 1 cm lateral and 2 cm distal to the ASIS and coursing towards  the anterior cortex of the femur. The skin was cut with a 10 blade  through subcutaneous tissue to the level of the fascia overlying the  tensor fascia lata muscle. The fascia was then incised in line with the  incision at the junction of the anterior third and posterior 2/3rd. The  muscle was teased off the fascia and then the interval between the TFL  and the rectus was developed. The Hohmann retractor was then placed at  the top of the femoral neck over the capsule. The vessels overlying the  capsule were cauterized and the fat on top of the capsule was removed.  A Hohmann retractor was then placed anterior underneath the rectus  femoris to give exposure to the entire anterior capsule. A T-shaped   capsulotomy was performed. The edges were tagged and the femoral head  was identified.       Osteophytes are removed off the superior acetabulum.  The femoral neck was then cut in situ with an oscillating saw. Traction  was then applied to the left lower extremity utilizing the [facility]  traction. The femoral head was then removed. Retractors were placed  around the acetabulum and then circumferential removal of the labrum was  performed. Osteophytes were also removed. Reaming starts at 45 mm to  medialize and  Increased in 2 mm increments to 47 mm. We reamed in  approximately 40 degrees of abduction, 20 degrees anteversion. A 48 mm  pinnacle acetabular shell was then impacted in anatomic position under  fluoroscopic guidance with excellent purchase. We did not need to place  any additional dome screws. A 28 mm neutral+4 marathon liner was then  placed into the acetabular shell.       The femoral lift was then placed along the lateral aspect of the femur  just distal to the vastus ridge. The leg was  externally rotated and capsule  was stripped off the inferior aspect of the femoral neck down to the  level of the lesser trochanter, this was done with electrocautery. The femur was lifted after this was performed. The  leg was then placed in an extended and adducted position essentially delivering the femur. We also removed the capsule superiorly and the piriformis from the piriformis fossa to gain excellent  exposure of the  proximal femur. Rongeur was used to remove some cancellous bone to get  into the lateral portion of the proximal femur for placement of the  initial starter reamer. The starter broaches was placed  the starter broach  and was shown to go down the center of the canal. Broaching  with the Actis system was then performed starting at size 0  coursing  Up to size 4. A size 4 had excellent torsional and rotational  and axial stability. The trial high offset neck was then placed   with a 28 + 1.5 trial head. The hip was then reduced. We confirmed that  the stem was in the canal both on AP and lateral x-rays. It also has excellent sizing. The hip was reduced with outstanding stability through full extension and full external rotation.. AP pelvis was taken and the leg lengths were measured and found to be equal. Hip was then dislocated again and the femoral head and neck removed. The  femoral broach was removed. Size 4 Actis stem with a high offset  neck was then impacted into the femur following native anteversion. Has  excellent purchase in the canal. Excellent torsional and rotational and  axial stability. It is confirmed to be in the canal on AP and lateral  fluoroscopic views. The 28 + 1.5 metal head was placed and the hip  reduced with outstanding stability. Again AP pelvis was taken and it  confirmed that the leg lengths were equal. The wound was then copiously  irrigated with saline solution and the capsule reattached and repaired  with Ethibond suture. 30 ml of .25% Bupivicaine was  injected into the capsule and into the edge of the tensor fascia lata as well as subcutaneous tissue. The fascia overlying the tensor fascia lata was then closed with a running #1 V-Loc. Subcu was closed with interrupted 2-0 Vicryl and subcuticular running 4-0 Monocryl. Incision was cleaned  and dried. Steri-Strips and a bulky sterile dressing applied. The patient was awakened and transported to  recovery in stable condition.        Please note that a surgical assistant was a medical necessity for this procedure to perform it in a safe and expeditious manner. Assistant was necessary to provide appropriate retraction of vital neurovascular structures and to prevent femoral fracture and allow for anatomic placement of the prosthesis.  Liliane Rei, M.D.

## 2023-07-02 NOTE — Plan of Care (Signed)
  Problem: Education: Goal: Knowledge of General Education information will improve Description: Including pain rating scale, medication(s)/side effects and non-pharmacologic comfort measures Outcome: Progressing   Problem: Clinical Measurements: Goal: Ability to maintain clinical measurements within normal limits will improve Outcome: Progressing   Problem: Activity: Goal: Risk for activity intolerance will decrease Outcome: Progressing   Problem: Coping: Goal: Level of anxiety will decrease Outcome: Progressing   Problem: Education: Goal: Knowledge of the prescribed therapeutic regimen will improve Outcome: Progressing   Problem: Pain Management: Goal: Pain level will decrease with appropriate interventions Outcome: Progressing   Ara Knee, RN 07/02/23 6:52 PM

## 2023-07-02 NOTE — Anesthesia Procedure Notes (Signed)
 Spinal  Patient location during procedure: OR Start time: 07/02/2023 1:28 PM End time: 07/02/2023 1:32 PM Reason for block: surgical anesthesia Staffing Performed: anesthesiologist  Anesthesiologist: Jake Mayers, MD Performed by: Jake Mayers, MD Authorized by: Jake Mayers, MD   Preanesthetic Checklist Completed: patient identified, IV checked, risks and benefits discussed, surgical consent, monitors and equipment checked, pre-op evaluation and timeout performed Spinal Block Patient position: sitting Prep: DuraPrep Patient monitoring: cardiac monitor, continuous pulse ox and blood pressure Approach: midline Location: L3-4 Injection technique: single-shot Needle Needle type: Pencan  Needle gauge: 24 G Needle length: 9 cm Assessment Sensory level: T8 Events: CSF return Additional Notes Functioning IV was confirmed and monitors were applied. Sterile prep and drape, including hand hygiene and sterile gloves were used. The patient was positioned and the spine was prepped. The skin was anesthetized with lidocaine .  Free flow of clear CSF was obtained prior to injecting local anesthetic into the CSF.  The spinal needle aspirated freely following injection.  The needle was carefully withdrawn.  The patient tolerated the procedure well.

## 2023-07-02 NOTE — Transfer of Care (Signed)
 Immediate Anesthesia Transfer of Care Note  Patient: Andrea Byrd  Procedure(s) Performed: ARTHROPLASTY, HIP, TOTAL, ANTERIOR APPROACH (Right: Hip)  Patient Location: PACU  Anesthesia Type:MAC and Spinal  Level of Consciousness: awake and alert   Airway & Oxygen Therapy: Patient Spontanous Breathing and Patient connected to face mask oxygen  Post-op Assessment: Report given to RN and Post -op Vital signs reviewed and stable  Post vital signs: Reviewed and stable  Last Vitals:  Vitals Value Taken Time  BP 100/78 07/02/23 1515  Temp    Pulse 62 07/02/23 1515  Resp 19 07/02/23 1517  SpO2 98 % 07/02/23 1515  Vitals shown include unfiled device data.  Last Pain:  Vitals:   07/02/23 1110  TempSrc:   PainSc: 8          Complications: No notable events documented.

## 2023-07-03 ENCOUNTER — Encounter (HOSPITAL_COMMUNITY): Payer: Self-pay | Admitting: Orthopedic Surgery

## 2023-07-03 DIAGNOSIS — M1611 Unilateral primary osteoarthritis, right hip: Secondary | ICD-10-CM | POA: Diagnosis not present

## 2023-07-03 LAB — BASIC METABOLIC PANEL WITH GFR
Anion gap: 7 (ref 5–15)
BUN: 20 mg/dL (ref 8–23)
CO2: 29 mmol/L (ref 22–32)
Calcium: 8.7 mg/dL — ABNORMAL LOW (ref 8.9–10.3)
Chloride: 101 mmol/L (ref 98–111)
Creatinine, Ser: 0.84 mg/dL (ref 0.44–1.00)
GFR, Estimated: >60 mL/min (ref >60)
Glucose, Bld: 128 mg/dL — ABNORMAL HIGH (ref 70–99)
Potassium: 4.5 mmol/L (ref 3.5–5.1)
Sodium: 137 mmol/L (ref 135–145)

## 2023-07-03 LAB — CBC
HCT: 24.2 % — ABNORMAL LOW (ref 36.0–46.0)
HCT: 25.1 % — ABNORMAL LOW (ref 36.0–46.0)
Hemoglobin: 7.3 g/dL — ABNORMAL LOW (ref 12.0–15.0)
Hemoglobin: 7.3 g/dL — ABNORMAL LOW (ref 12.0–15.0)
MCH: 25 pg — ABNORMAL LOW (ref 26.0–34.0)
MCH: 24.4 pg — ABNORMAL LOW (ref 26.0–34.0)
MCHC: 30.2 g/dL (ref 30.0–36.0)
MCHC: 29.1 g/dL — ABNORMAL LOW (ref 30.0–36.0)
MCV: 82.9 fL (ref 80.0–100.0)
MCV: 83.9 fL (ref 80.0–100.0)
Platelets: 286 10*3/uL (ref 150–400)
Platelets: 274 10*3/uL (ref 150–400)
RBC: 2.92 MIL/uL — ABNORMAL LOW (ref 3.87–5.11)
RBC: 2.99 MIL/uL — ABNORMAL LOW (ref 3.87–5.11)
RDW: 15.7 % — ABNORMAL HIGH (ref 11.5–15.5)
RDW: 15.9 % — ABNORMAL HIGH (ref 11.5–15.5)
WBC: 14 10*3/uL — ABNORMAL HIGH (ref 4.0–10.5)
WBC: 13.8 10*3/uL — ABNORMAL HIGH (ref 4.0–10.5)
nRBC: 0 % (ref 0.0–0.2)
nRBC: 0 % (ref 0.0–0.2)

## 2023-07-03 MED ORDER — DIPHENHYDRAMINE HCL 12.5 MG/5ML PO ELIX
12.5000 mg | ORAL_SOLUTION | Freq: Four times a day (QID) | ORAL | Status: DC | PRN
  Administered 2023-07-03 (×2): 12.5 mg via ORAL
  Administered 2023-07-04 – 2023-07-05 (×4): 25 mg via ORAL
  Filled 2023-07-03: qty 10
  Filled 2023-07-03: qty 5
  Filled 2023-07-03: qty 10
  Filled 2023-07-03: qty 5
  Filled 2023-07-03 (×2): qty 10

## 2023-07-03 NOTE — Care Management Obs Status (Signed)
 MEDICARE OBSERVATION STATUS NOTIFICATION   Patient Details  Name: Andrea Byrd MRN: [id number] Date of Birth: Nov 23, 1945   Medicare Observation Status Notification Given:  Yes  Pt refused to sign   Natha Guin, LCSW 07/03/2023, 2:18 PM

## 2023-07-03 NOTE — TOC Transition Note (Signed)
 Transition of Care [facility]) - Discharge Note   Patient Details  Name: Andrea Byrd MRN: [id number] Date of Birth: [birth year]  Transition of Care [facility]) CM/SW Contact:  Delilah Fend, LCSW Phone Number: 07/03/2023, 10:39 AM   Clinical Narrative:     Met with pt who confirms she has needed DME in the home.  Plan for HEP.  No further TOC needs.  Final next level of care: Home/Self Care Barriers to Discharge: No Barriers Identified   Patient Goals and CMS Choice Patient states their goals for this hospitalization and ongoing recovery are:: return home          Discharge Placement                       Discharge Plan and Services Additional resources added to the After Visit Summary for                  DME Arranged: N/A DME Agency: NA                  Social Drivers of Health (SDOH) Interventions SDOH Screenings   Food Insecurity: No Food Insecurity (07/02/2023)  Housing: Low Risk  (07/02/2023)  Transportation Needs: No Transportation Needs (07/02/2023)  Utilities: Not At Risk (07/02/2023)  Alcohol Screen: Low Risk  (06/28/2022)  Depression (PHQ2-9): Low Risk  (06/04/2023)  Financial Resource Strain: Low Risk  (06/28/2022)  Physical Activity: Inactive (06/28/2022)  Social Connections: Moderately Integrated (07/02/2023)  Stress: No Stress Concern Present (06/28/2022)  Tobacco Use: Low Risk  (07/02/2023)     Readmission Risk Interventions     No data to display

## 2023-07-03 NOTE — Progress Notes (Signed)
 Physical Therapy Treatment Patient Details Name: Andrea Byrd MRN: [id number] DOB: April 28, 1945 Today's Date: 07/03/2023   History of Present Illness 78 yo female s/p R DA THA on 07/02/23. PMH: HTN, RLS, IBS    PT Comments  Pt not progressing d/t elevated pain and fatigue. Contacted RN regarding meds. Pt reports some dizziness, worse during transfer back to bed. Pm Hgb check stable at 7.3. VSS.  Will continue to follow in acute setting. Anticipate good progress once pain cotnrolled.   If plan is discharge home, recommend the following: A little help with walking and/or transfers;A little help with bathing/dressing/bathroom;Assistance with cooking/housework;Help with stairs or ramp for entrance   Can travel by private vehicle        Equipment Recommendations  None recommended by PT    Recommendations for Other Services       Precautions / Restrictions Precautions Precautions: Fall Restrictions Weight Bearing Restrictions Per Provider Order: No RLE Weight Bearing Per Provider Order: Weight bearing as tolerated     Mobility  Bed Mobility Overal bed mobility: Needs Assistance Bed Mobility: Sit to Supine     Supine to sit: Min assist Sit to supine: Min assist   General bed mobility comments: assist with RLE    Transfers Overall transfer level: Needs assistance Equipment used: Rolling walker (2 wheels) Transfers: Sit to/from Stand, Bed to chair/wheelchair/BSC Sit to Stand: Min assist   Step pivot transfers: Min assist       General transfer comment: cues for  hand placement and RLE position; cues for sequencing step pivot    Ambulation/Gait Ambulation/Gait assistance: Min assist Gait Distance (Feet): 8 Feet Assistive device: Rolling walker (2 wheels) Gait Pattern/deviations: Step-to pattern       General Gait Details: unable d/t pain   Stairs             Wheelchair Mobility     Tilt Bed    Modified Rankin (Stroke Patients Only)       Balance                                             Communication Communication Communication: No apparent difficulties  Cognition Arousal: Alert Behavior During Therapy: WFL for tasks assessed/performed   PT - Cognitive impairments: No apparent impairments                         Following commands: Intact      Cueing Cueing Techniques: Verbal cues  Exercises Total Joint Exercises Ankle Circles/Pumps: AROM, 10 reps, Both Quad Sets: Both, 5 reps, AROM Heel Slides: Right, 10 reps, AAROM    General Comments        Pertinent Vitals/Pain Pain Assessment Pain Assessment: 0-10 Pain Score: 10-Worst pain ever Pain Location: right hip Pain Descriptors / Indicators: Aching, Sore, Guarding Pain Intervention(s): Limited activity within patient's tolerance, Monitored during session, Premedicated before session, Repositioned, Ice applied    Home Living Family/patient expects to be discharged to:: Private residence Living Arrangements: Alone Available Help at Discharge: Family;Available 24 hours/day Type of Home: House Home Access: Stairs to enter   [employer] of Steps: 1   Home Layout: One level Home Equipment: Agricultural consultant (2 wheels);Cane - single point      Prior Function            PT Goals (current goals can now  be found in the care plan section) Acute Rehab PT Goals PT Goal Formulation: With patient Time For Goal Achievement: 07/12/23 Potential to Achieve Goals: Good Progress towards PT goals: Progressing toward goals    Frequency    7X/week      PT Plan      Co-evaluation              AM-PAC PT "6 Clicks" Mobility   Outcome Measure  Help needed turning from your back to your side while in a flat bed without using bedrails?: A Little Help needed moving from lying on your back to sitting on the side of a flat bed without using bedrails?: A Little Help needed moving to and from a bed to a chair (including a  wheelchair)?: A Little Help needed standing up from a chair using your arms (e.g., wheelchair or bedside chair)?: A Little Help needed to walk in hospital room?: A Little Help needed climbing 3-5 steps with a railing? : A Lot 6 Click Score: 17    End of Session Equipment Utilized During Treatment: Gait belt Activity Tolerance: Patient limited by pain;Patient limited by fatigue Patient left: in bed;with call bell/phone within reach;with bed alarm set;with family/visitor present   PT Visit Diagnosis: Other abnormalities of gait and mobility (R26.89)     Time: 1440-1510 PT Time Calculation (min) (ACUTE ONLY): 30 min  Charges:    $Gait Training: 8-22 mins $Therapeutic Activity: 23-37 mins PT General Charges $$ ACUTE PT VISIT: 1 Visit                     Corney Knighton, PT  Acute Rehab Dept ([facility]) [phone]  07/03/2023    [facility] 07/03/2023, 4:00 PM

## 2023-07-03 NOTE — Plan of Care (Signed)
  Problem: Clinical Measurements: Goal: Ability to maintain clinical measurements within normal limits will improve Outcome: Progressing Goal: Diagnostic test results will improve Outcome: Progressing Goal: Respiratory complications will improve Outcome: Progressing Goal: Cardiovascular complication will be avoided Outcome: Progressing   Problem: Activity: Goal: Risk for activity intolerance will decrease Outcome: Progressing

## 2023-07-03 NOTE — Progress Notes (Signed)
   Subjective: 1 Day Post-Op Procedure(s) (LRB): ARTHROPLASTY, HIP, TOTAL, ANTERIOR APPROACH (Right) Patient reports pain as mild.   Patient seen in rounds by Dr. France Ina. Patient is doing well in regards to the hip. No issues overnight. Foley catheter removed.  We will begin therapy today.   Objective: Vital signs in last 24 hours: Temp:  [97.5 F (36.4 C)-98.1 F (36.7 C)] 98 F (36.7 C) (04/29 0521) Pulse Rate:  [53-68] 68 (04/29 0521) Resp:  [10-19] 16 (04/29 0521) BP: (83-158)/(46-78) 110/74 (04/29 0521) SpO2:  [93 %-100 %] 99 % (04/29 0521) Weight:  [88.5 kg] 88.5 kg (04/28 1110)  Intake/Output from previous day:  Intake/Output Summary (Last 24 hours) at 07/03/2023 0744 Last data filed at 07/03/2023 0620 Gross per 24 hour  Intake 3154.98 ml  Output 2350 ml  Net 804.98 ml     Intake/Output this shift: No intake/output data recorded.  Labs: Recent Labs    07/03/23 0318  HGB 7.3*   Recent Labs    07/03/23 0318  WBC 14.0*  RBC 2.92*  HCT 24.2*  PLT 286   Recent Labs    07/03/23 0318  NA 137  K 4.5  CL 101  CO2 29  BUN 20  CREATININE 0.84  GLUCOSE 128*  CALCIUM 8.7*   No results for input(s): "LABPT", "INR" in the last 72 hours.  Exam: General - Patient is Alert and Oriented Extremity - Neurologically intact Neurovascular intact Sensation intact distally Dorsiflexion/Plantar flexion intact Dressing - dressing C/D/I Motor Function - intact, moving foot and toes well on exam.   Past Medical History:  Diagnosis Date   Anemia    Arthritis    Cervical cancer (HCC) 1977   Diverticulosis    Dry eyes    Endometriosis    Esophageal stricture    Gastritis    GERD (gastroesophageal reflux disease)    Glaucoma    Headache    Herpes simplex type 1 infection    Hiatal hernia    History of colon polyps    History of gallstones    Hypertension    IBS (irritable bowel syndrome)    Insomnia    Post-operative nausea and vomiting    Restless leg  syndrome     Assessment/Plan: 1 Day Post-Op Procedure(s) (LRB): ARTHROPLASTY, HIP, TOTAL, ANTERIOR APPROACH (Right) Principal Problem:   OA (osteoarthritis) of hip Active Problems:   Osteoarthritis of right hip  Estimated body mass index is 36.84 kg/m as calculated from the following:   Height as of this encounter: 5\' 1"  (1.549 m).   Weight as of this encounter: 88.5 kg. Advance diet Up with therapy  DVT Prophylaxis - Aspirin  Weight bearing as tolerated. Begin therapy.  Plan is to go Home after hospital stay.  Hemoglobin 7.3 this morning from 10.0 preoperatively. Will recheck CBC at 1300 today. If less than 7, will plan to transfuse. This was discussed with the patient. If stable or improved, can dc later today if meeting therapy goals.  Sharlynn Dear, PA-C Orthopedic Surgery [phone] 07/03/2023, 7:44 AM

## 2023-07-03 NOTE — Evaluation (Signed)
 Physical Therapy Evaluation Patient Details Name: Andrea Byrd MRN: [id number] DOB: 18-Mar-1945 Today's Date: 07/03/2023  History of Present Illness  78 yo female s/p R DA THA on 07/02/23. PMH: HTN, RLS, IBS  Clinical Impression  Pt is s/p THA resulting in the deficits listed below (see PT Problem List).  Pt reports slight dizziness and 10/10 pain with short distance amb. Pt to have Hgb recheck later today (Hgb 7.3 this am). Will monitor progress in pm session and determine readiness for d/c.   Pt will benefit from acute skilled PT to increase their independence and safety with mobility to facilitate discharge.          If plan is discharge home, recommend the following: A little help with walking and/or transfers;A little help with bathing/dressing/bathroom;Assistance with cooking/housework;Help with stairs or ramp for entrance   Can travel by private vehicle        Equipment Recommendations None recommended by PT  Recommendations for Other Services       Functional Status Assessment Patient has had a recent decline in their functional status and demonstrates the ability to make significant improvements in function in a reasonable and predictable amount of time.     Precautions / Restrictions Precautions Precautions: Fall Restrictions Weight Bearing Restrictions Per Provider Order: No RLE Weight Bearing Per Provider Order: Weight bearing as tolerated      Mobility  Bed Mobility Overal bed mobility: Needs Assistance Bed Mobility: Supine to Sit     Supine to sit: Min assist     General bed mobility comments: assist with RLE    Transfers Overall transfer level: Needs assistance Equipment used: Rolling walker (2 wheels) Transfers: Sit to/from Stand, Bed to chair/wheelchair/BSC Sit to Stand: Min assist   Step pivot transfers: Min assist       General transfer comment: cues for  hand placement and RLE position    Ambulation/Gait Ambulation/Gait assistance: Min  assist Gait Distance (Feet): 8 Feet Assistive device: Rolling walker (2 wheels) Gait Pattern/deviations: Step-to pattern       General Gait Details: cues for sequence and RW position  Stairs            Wheelchair Mobility     Tilt Bed    Modified Rankin (Stroke Patients Only)       Balance                                             Pertinent Vitals/Pain Pain Assessment Pain Assessment: 0-10 Pain Score: 10-Worst pain ever Pain Location: right hip Pain Descriptors / Indicators: Aching, Sore, Guarding Pain Intervention(s): Limited activity within patient's tolerance, Monitored during session, Premedicated before session    Home Living Family/patient expects to be discharged to:: Private residence Living Arrangements: Alone Available Help at Discharge: Family;Available 24 hours/day Type of Home: House Home Access: Stairs to enter   [employer] of Steps: 1   Home Layout: One level Home Equipment: Agricultural consultant (2 wheels);Cane - single point      Prior Function Prior Level of Function : Independent/Modified Independent                     Extremity/Trunk Assessment   Upper Extremity Assessment Upper Extremity Assessment: Overall WFL for tasks assessed    Lower Extremity Assessment Lower Extremity Assessment: RLE deficits/detail RLE Deficits / Details: ankle WFL,  knee  and hip AAROM grossly WFL, some limitations d/t post op weakness and pain       Communication   Communication Communication: No apparent difficulties    Cognition Arousal: Alert Behavior During Therapy: WFL for tasks assessed/performed   PT - Cognitive impairments: No apparent impairments                         Following commands: Intact       Cueing Cueing Techniques: Verbal cues     General Comments      Exercises Total Joint Exercises Ankle Circles/Pumps: AROM, 10 reps, Both Quad Sets: Both, 5 reps, AROM    Assessment/Plan    PT Assessment Patient needs continued PT services  PT Problem List Decreased strength;Decreased activity tolerance;Decreased knowledge of use of DME;Decreased mobility;Decreased knowledge of precautions;Decreased balance;Decreased range of motion;Pain       PT Treatment Interventions DME instruction;Therapeutic exercise;Gait training;Functional mobility training;Therapeutic activities;Patient/family education    PT Goals (Current goals can be found in the Care Plan section)  Acute Rehab PT Goals PT Goal Formulation: With patient Time For Goal Achievement: 07/12/23 Potential to Achieve Goals: Good    Frequency       Co-evaluation               AM-PAC PT "6 Clicks" Mobility  Outcome Measure Help needed turning from your back to your side while in a flat bed without using bedrails?: A Little Help needed moving from lying on your back to sitting on the side of a flat bed without using bedrails?: A Little Help needed moving to and from a bed to a chair (including a wheelchair)?: A Little Help needed standing up from a chair using your arms (e.g., wheelchair or bedside chair)?: A Little Help needed to walk in hospital room?: A Little Help needed climbing 3-5 steps with a railing? : A Lot 6 Click Score: 17    End of Session Equipment Utilized During Treatment: Gait belt Activity Tolerance: Patient tolerated treatment well Patient left: with call bell/phone within reach;in chair;with chair alarm set;with family/visitor present   PT Visit Diagnosis: Other abnormalities of gait and mobility (R26.89)    Time: 5621-3086 PT Time Calculation (min) (ACUTE ONLY): 24 min   Charges:   PT Evaluation $PT Eval Low Complexity: 1 Low PT Treatments $Gait Training: 8-22 mins PT General Charges $$ ACUTE PT VISIT: 1 Visit         Aidan Moten, PT  Acute Rehab Dept ([facility]) [phone]  07/03/2023   [facility] 07/03/2023, 2:20 PM

## 2023-07-03 NOTE — Plan of Care (Signed)
  Problem: Clinical Measurements: Goal: Ability to maintain clinical measurements within normal limits will improve Outcome: Progressing   Problem: Safety: Goal: Ability to remain free from injury will improve Outcome: Progressing   Problem: Education: Goal: Knowledge of the prescribed therapeutic regimen will improve Outcome: Progressing   Problem: Clinical Measurements: Goal: Postoperative complications will be avoided or minimized Outcome: Progressing   Problem: Pain Management: Goal: Pain level will decrease with appropriate interventions Outcome: Progressing

## 2023-07-04 DIAGNOSIS — M1611 Unilateral primary osteoarthritis, right hip: Secondary | ICD-10-CM | POA: Diagnosis not present

## 2023-07-04 LAB — CBC
HCT: 22.8 % — ABNORMAL LOW (ref 36.0–46.0)
Hemoglobin: 6.8 g/dL — CL (ref 12.0–15.0)
MCH: 25.1 pg — ABNORMAL LOW (ref 26.0–34.0)
MCHC: 29.8 g/dL — ABNORMAL LOW (ref 30.0–36.0)
MCV: 84.1 fL (ref 80.0–100.0)
Platelets: 251 10*3/uL (ref 150–400)
RBC: 2.71 MIL/uL — ABNORMAL LOW (ref 3.87–5.11)
RDW: 15.9 % — ABNORMAL HIGH (ref 11.5–15.5)
WBC: 12.8 10*3/uL — ABNORMAL HIGH (ref 4.0–10.5)
nRBC: 0 % (ref 0.0–0.2)

## 2023-07-04 LAB — HEMOGLOBIN AND HEMATOCRIT, BLOOD
HCT: 29.7 % — ABNORMAL LOW (ref 36.0–46.0)
Hemoglobin: 9.3 g/dL — ABNORMAL LOW (ref 12.0–15.0)

## 2023-07-04 LAB — PREPARE RBC (CROSSMATCH)

## 2023-07-04 MED ORDER — SODIUM CHLORIDE 0.9% IV SOLUTION: Freq: Once | INTRAVENOUS | Status: AC

## 2023-07-04 NOTE — Plan of Care (Signed)
  Problem: Pain Management: Goal: Pain level will decrease with appropriate interventions Outcome: Progressing   Problem: Clinical Measurements: Goal: Postoperative complications will be avoided or minimized Outcome: Progressing   Problem: Activity: Goal: Ability to tolerate increased activity will improve Outcome: Progressing   Problem: Elimination: Goal: Will not experience complications related to urinary retention Outcome: Progressing   Problem: Safety: Goal: Ability to remain free from injury will improve Outcome: Progressing

## 2023-07-04 NOTE — Progress Notes (Signed)
   Subjective: 2 Days Post-Op Procedure(s) (LRB): ARTHROPLASTY, HIP, TOTAL, ANTERIOR APPROACH (Right) Patient reports pain as moderate.   Patient seen in rounds by Dr. France Ina. Patient is having thigh pain this morning, will try to stay on meds better today. Had dizziness with physical therapy yesterday Plan is to go Home after hospital stay.  Objective: Vital signs in last 24 hours: Temp:  [97.6 F (36.4 C)-97.8 F (36.6 C)] 97.6 F (36.4 C) (04/30 0447) Pulse Rate:  [67-72] 72 (04/30 0447) Resp:  [16-18] 18 (04/30 0447) BP: (101-121)/(48-56) 121/56 (04/30 0447) SpO2:  [97 %-99 %] 98 % (04/30 0447)  Intake/Output from previous day:  Intake/Output Summary (Last 24 hours) at 07/04/2023 0726 Last data filed at 07/04/2023 0447 Gross per 24 hour  Intake 1400.39 ml  Output 800 ml  Net 600.39 ml    Intake/Output this shift: No intake/output data recorded.  Labs: Recent Labs    07/03/23 0318 07/03/23 1310 07/04/23 0319  HGB 7.3* 7.3* 6.8*   Recent Labs    07/03/23 1310 07/04/23 0319  WBC 13.8* 12.8*  RBC 2.99* 2.71*  HCT 25.1* 22.8*  PLT 274 251   Recent Labs    07/03/23 0318  NA 137  K 4.5  CL 101  CO2 29  BUN 20  CREATININE 0.84  GLUCOSE 128*  CALCIUM 8.7*   No results for input(s): "LABPT", "INR" in the last 72 hours.  Exam: General - Patient is Alert and Oriented Extremity - Neurologically intact Neurovascular intact Sensation intact distally Dorsiflexion/Plantar flexion intact Dressing/Incision - clean, dry, no drainage Motor Function - intact, moving foot and toes well on exam.   Past Medical History:  Diagnosis Date   Anemia    Arthritis    Cervical cancer (HCC) 1977   Diverticulosis    Dry eyes    Endometriosis    Esophageal stricture    Gastritis    GERD (gastroesophageal reflux disease)    Glaucoma    Headache    Herpes simplex type 1 infection    Hiatal hernia    History of colon polyps    History of gallstones    Hypertension     IBS (irritable bowel syndrome)    Insomnia    Post-operative nausea and vomiting    Restless leg syndrome     Assessment/Plan: 2 Days Post-Op Procedure(s) (LRB): ARTHROPLASTY, HIP, TOTAL, ANTERIOR APPROACH (Right) Principal Problem:   OA (osteoarthritis) of hip Active Problems:   Osteoarthritis of right hip  Estimated body mass index is 36.84 kg/m as calculated from the following:   Height as of this encounter: 5\' 1"  (1.549 m).   Weight as of this encounter: 88.5 kg. Up with therapy  DVT Prophylaxis - Aspirin  Weight-bearing as tolerated  Hemoglobin 6.8 this AM and experienced dizziness with ambulation yesterday. 2 units RBCs ordered today, discussed with patient and she agrees. Probable dc tomorrow.  Sharlynn Dear, PA-C Orthopedic Surgery [phone] 07/04/2023, 7:26 AM

## 2023-07-04 NOTE — Progress Notes (Signed)
 Date and time results received: 07/04/23 0500  Test: Hgb  Critical Value: 6.8  Name of Provider Notified: Kim Pen, PA

## 2023-07-04 NOTE — Progress Notes (Signed)
 Physical Therapy Treatment Patient Details Name: Andrea Byrd MRN: [id number] DOB: 10/31/1945 Today's Date: 07/04/2023   History of Present Illness 78 yo female s/p R DA THA on 07/02/23. PMH: HTN, RLS, IBS    PT Comments  Pt making steady progress. Amb ~ 37' with RW and min assist, reports some dizziness with amb, BP 111/53. Continue PT POC   If plan is discharge home, recommend the following: A little help with walking and/or transfers;A little help with bathing/dressing/bathroom;Assistance with cooking/housework;Help with stairs or ramp for entrance   Can travel by private vehicle        Equipment Recommendations  None recommended by PT    Recommendations for Other Services       Precautions / Restrictions Precautions Precautions: Fall Restrictions RLE Weight Bearing Per Provider Order: Weight bearing as tolerated     Mobility  Bed Mobility Overal bed mobility: Needs Assistance Bed Mobility: Supine to Sit     Supine to sit: Min assist, HOB elevated     General bed mobility comments: light min assist with RLE, cues for technique    Transfers Overall transfer level: Needs assistance Equipment used: Rolling walker (2 wheels) Transfers: Sit to/from Stand Sit to Stand: Min assist, Contact guard assist           General transfer comment: cues for  hand placement and RLE position    Ambulation/Gait Ambulation/Gait assistance: Min assist, Contact guard assist Gait Distance (Feet): 60 Feet Assistive device: Rolling walker (2 wheels) Gait Pattern/deviations: Step-to pattern, Step-through pattern       General Gait Details: cues for sequence and RW position intitially. able to progress to step though pattern end of distance. reports some dizziness, BP 111/53   Stairs             Wheelchair Mobility     Tilt Bed    Modified Rankin (Stroke Patients Only)       Balance                                            Communication  Communication Communication: No apparent difficulties  Cognition Arousal: Alert Behavior During Therapy: WFL for tasks assessed/performed   PT - Cognitive impairments: No apparent impairments                         Following commands: Intact      Cueing Cueing Techniques: Verbal cues  Exercises    General Comments        Pertinent Vitals/Pain Pain Assessment Pain Assessment: 0-10 Pain Score: 4  Pain Location: right hip Pain Descriptors / Indicators: Aching, Sore, Guarding Pain Intervention(s): Limited activity within patient's tolerance, Monitored during session, Premedicated before session, Repositioned, Ice applied    Home Living                          Prior Function            PT Goals (current goals can now be found in the care plan section) Acute Rehab PT Goals PT Goal Formulation: With patient Time For Goal Achievement: 07/12/23 Potential to Achieve Goals: Good Progress towards PT goals: Progressing toward goals    Frequency    7X/week      PT Plan      Co-evaluation  AM-PAC PT "6 Clicks" Mobility   Outcome Measure  Help needed turning from your back to your side while in a flat bed without using bedrails?: A Little Help needed moving from lying on your back to sitting on the side of a flat bed without using bedrails?: A Little Help needed moving to and from a bed to a chair (including a wheelchair)?: A Little Help needed standing up from a chair using your arms (e.g., wheelchair or bedside chair)?: A Little Help needed to walk in hospital room?: A Little Help needed climbing 3-5 steps with a railing? : A Lot 6 Click Score: 17    End of Session Equipment Utilized During Treatment: Gait belt Activity Tolerance: Patient tolerated treatment well;Treatment limited secondary to medical complications (Comment) (dizziness) Patient left: with call bell/phone within reach;with family/visitor present;in chair;with  chair alarm set   PT Visit Diagnosis: Other abnormalities of gait and mobility (R26.89)     Time: 1610-9604 PT Time Calculation (min) (ACUTE ONLY): 15 min  Charges:    $Gait Training: 8-22 mins PT General Charges $$ ACUTE PT VISIT: 1 Visit                     Tyquon Near, PT  Acute Rehab Dept [facility]) [phone]  07/04/2023    [facility] 07/04/2023, 3:35 PM

## 2023-07-04 NOTE — Progress Notes (Signed)
 Physical Therapy Treatment Patient Details Name: Andrea Byrd MRN: [id number] DOB: 01-Aug-1945 Today's Date: 07/04/2023   History of Present Illness 78 yo female s/p R DA THA on 07/02/23. PMH: HTN, RLS, IBS    PT Comments  Hgb 6.8 today down form 7.3, pt to have transfusion today; reviewed THA exercises, pt tol well, pain controlled. Will continue PT POC, progress mobility as possible   If plan is discharge home, recommend the following: A little help with walking and/or transfers;A little help with bathing/dressing/bathroom;Assistance with cooking/housework;Help with stairs or ramp for entrance   Can travel by private vehicle        Equipment Recommendations  None recommended by PT    Recommendations for Other Services       Precautions / Restrictions Precautions Precautions: Fall Restrictions RLE Weight Bearing Per Provider Order: Weight bearing as tolerated     Mobility  Bed Mobility               General bed mobility comments: deferred OOB at this time    Transfers                        Ambulation/Gait                   Stairs             Wheelchair Mobility     Tilt Bed    Modified Rankin (Stroke Patients Only)       Balance                                            Communication Communication Communication: No apparent difficulties  Cognition Arousal: Alert Behavior During Therapy: WFL for tasks assessed/performed   PT - Cognitive impairments: No apparent impairments                         Following commands: Intact      Cueing Cueing Techniques: Verbal cues  Exercises Total Joint Exercises Ankle Circles/Pumps: AROM, Both, 15 reps Quad Sets: AROM, Strengthening, Both, 10 reps Heel Slides: Right, 10 reps, AAROM Hip ABduction/ADduction: AAROM, Right, 10 reps    General Comments        Pertinent Vitals/Pain Pain Assessment Pain Assessment: 0-10 Pain Score: 3  Pain Location:  right hip Pain Descriptors / Indicators: Aching, Sore, Guarding Pain Intervention(s): Limited activity within patient's tolerance, Premedicated before session, Monitored during session, Ice applied    Home Living                          Prior Function            PT Goals (current goals can now be found in the care plan section) Acute Rehab PT Goals PT Goal Formulation: With patient Time For Goal Achievement: 07/12/23 Potential to Achieve Goals: Good Progress towards PT goals: Progressing toward goals    Frequency    7X/week      PT Plan      Co-evaluation              AM-PAC PT "6 Clicks" Mobility   Outcome Measure  Help needed turning from your back to your side while in a flat bed without using bedrails?: A Little Help needed moving from lying on your back  to sitting on the side of a flat bed without using bedrails?: A Little Help needed moving to and from a bed to a chair (including a wheelchair)?: A Little Help needed standing up from a chair using your arms (e.g., wheelchair or bedside chair)?: A Little Help needed to walk in hospital room?: A Little Help needed climbing 3-5 steps with a railing? : A Lot 6 Click Score: 17    End of Session   Activity Tolerance: Treatment limited secondary to medical complications (Comment) (Hgb 6.8, to have transfusion today) Patient left: in bed;with call bell/phone within reach;with bed alarm set;with family/visitor present   PT Visit Diagnosis: Other abnormalities of gait and mobility (R26.89)     Time: 1610-9604 PT Time Calculation (min) (ACUTE ONLY): 21 min  Charges:    $Therapeutic Exercise: 8-22 mins PT General Charges $$ ACUTE PT VISIT: 1 Visit                     Marnie Fazzino, PT  Acute Rehab Dept [facility]) [phone]  07/04/2023    [facility] 07/04/2023, 11:52 AM

## 2023-07-05 DIAGNOSIS — M1611 Unilateral primary osteoarthritis, right hip: Secondary | ICD-10-CM | POA: Diagnosis not present

## 2023-07-05 LAB — TYPE AND SCREEN
ABO/RH(D): B NEG
Antibody Screen: NEGATIVE
Unit division: 0
Unit division: 0

## 2023-07-05 LAB — BPAM RBC
Blood Product Expiration Date: 202505292359
Blood Product Expiration Date: 202506052359
ISSUE DATE / TIME: 202504301300
ISSUE DATE / TIME: 202504301718
Unit Type and Rh: 1700
Unit Type and Rh: 1700

## 2023-07-05 MED ORDER — ONDANSETRON HCL 4 MG PO TABS: 4.0000 mg | ORAL_TABLET | Freq: Four times a day (QID) | ORAL | 0 refills | Status: DC | PRN

## 2023-07-05 MED ORDER — METHOCARBAMOL 500 MG PO TABS: 500.0000 mg | ORAL_TABLET | Freq: Four times a day (QID) | ORAL | 0 refills | Status: DC | PRN

## 2023-07-05 MED ORDER — HYDROCODONE-ACETAMINOPHEN 5-325 MG PO TABS
1.0000 | ORAL_TABLET | Freq: Four times a day (QID) | ORAL | 0 refills | Status: DC | PRN
Start: 2023-07-05 — End: 2024-01-20

## 2023-07-05 MED ORDER — ASPIRIN 81 MG PO CHEW: 81.0000 mg | CHEWABLE_TABLET | Freq: Two times a day (BID) | ORAL | 0 refills | Status: AC

## 2023-07-05 NOTE — Progress Notes (Signed)
   Subjective: 3 Days Post-Op Procedure(s) (LRB): ARTHROPLASTY, HIP, TOTAL, ANTERIOR APPROACH (Right) Patient seen in rounds by Dr. France Ina. Patient is well, and has had no acute complaints or problems. Received 2 units PRBCs yesterday and feeling much better today. Patient reports pain as moderate.    Objective: Vital signs in last 24 hours: Temp:  [97.7 F (36.5 C)-99 F (37.2 C)] 98.3 F (36.8 C) (05/01 0621) Pulse Rate:  [71-82] 77 (05/01 0621) Resp:  [15-18] 18 (05/01 0621) BP: (96-137)/(48-93) 137/58 (05/01 0621) SpO2:  [94 %-100 %] 94 % (05/01 0621)  Intake/Output from previous day:  Intake/Output Summary (Last 24 hours) at 07/05/2023 0758 Last data filed at 07/04/2023 2035 Gross per 24 hour  Intake 900 ml  Output 450 ml  Net 450 ml    Intake/Output this shift: No intake/output data recorded.  Labs: Recent Labs    07/03/23 0318 07/03/23 1310 07/04/23 0319 07/04/23 2313  HGB 7.3* 7.3* 6.8* 9.3*   Recent Labs    07/03/23 1310 07/04/23 0319 07/04/23 2313  WBC 13.8* 12.8*  --   RBC 2.99* 2.71*  --   HCT 25.1* 22.8* 29.7*  PLT 274 251  --    Recent Labs    07/03/23 0318  NA 137  K 4.5  CL 101  CO2 29  BUN 20  CREATININE 0.84  GLUCOSE 128*  CALCIUM 8.7*   No results for input(s): "LABPT", "INR" in the last 72 hours.  Exam: General - Patient is Alert and Oriented Extremity - Neurologically intact Neurovascular intact Sensation intact distally Dorsiflexion/Plantar flexion intact Dressing/Incision - clean, dry, no drainage Motor Function - intact, moving foot and toes well on exam.  Past Medical History:  Diagnosis Date   Anemia    Arthritis    Cervical cancer (HCC) 1977   Diverticulosis    Dry eyes    Endometriosis    Esophageal stricture    Gastritis    GERD (gastroesophageal reflux disease)    Glaucoma    Headache    Herpes simplex type 1 infection    Hiatal hernia    History of colon polyps    History of gallstones     Hypertension    IBS (irritable bowel syndrome)    Insomnia    Post-operative nausea and vomiting    Restless leg syndrome     Assessment/Plan: 3 Days Post-Op Procedure(s) (LRB): ARTHROPLASTY, HIP, TOTAL, ANTERIOR APPROACH (Right) Principal Problem:   OA (osteoarthritis) of hip Active Problems:   Osteoarthritis of right hip  Estimated body mass index is 36.84 kg/m as calculated from the following:   Height as of this encounter: 5\' 1"  (1.549 m).   Weight as of this encounter: 88.5 kg.  DVT Prophylaxis - Aspirin  Weight-bearing as tolerated.  Hgb 9.3 after transfusion.  Continue PT this AM and then should be able to d/c home. Plan for HEP once discharged. F/u in clinic in 2 weeks.  Alfreda Imus, PA-C Orthopedic Surgery 07/05/2023, 7:58 AM

## 2023-07-05 NOTE — Plan of Care (Signed)
  Problem: Health Behavior/Discharge Planning: Goal: Ability to manage health-related needs will improve Outcome: Adequate for Discharge   Problem: Clinical Measurements: Goal: Ability to maintain clinical measurements within normal limits will improve Outcome: Adequate for Discharge Goal: Will remain free from infection Outcome: Progressing Goal: Diagnostic test results will improve Outcome: Adequate for Discharge Goal: Respiratory complications will improve Outcome: Adequate for Discharge Goal: Cardiovascular complication will be avoided Outcome: Progressing   Problem: Activity: Goal: Risk for activity intolerance will decrease Outcome: Adequate for Discharge   Problem: Coping: Goal: Level of anxiety will decrease Outcome: Progressing   Problem: Elimination: Goal: Will not experience complications related to bowel motility Outcome: Progressing Goal: Will not experience complications related to urinary retention Outcome: Completed/Met   Problem: Pain Managment: Goal: General experience of comfort will improve and/or be controlled Outcome: Adequate for Discharge   Problem: Safety: Goal: Ability to remain free from injury will improve Outcome: Adequate for Discharge   Problem: Skin Integrity: Goal: Risk for impaired skin integrity will decrease Outcome: Adequate for Discharge   Problem: Education: Goal: Knowledge of the prescribed therapeutic regimen will improve Outcome: Adequate for Discharge Goal: Understanding of discharge needs will improve Outcome: Progressing   Problem: Activity: Goal: Ability to avoid complications of mobility impairment will improve Outcome: Adequate for Discharge Goal: Ability to tolerate increased activity will improve Outcome: Adequate for Discharge   Problem: Clinical Measurements: Goal: Postoperative complications will be avoided or minimized Outcome: Progressing

## 2023-07-05 NOTE — Progress Notes (Signed)
 Physical Therapy Treatment Patient Details Name: Andrea Byrd MRN: [id number] DOB: 1945-09-03 Today's Date: 07/05/2023   History of Present Illness 78 yo female s/p R DA THA on 07/02/23. PMH: HTN, RLS, IBS    PT Comments  Pt cooperative and progressing well with mobility.  Pt up to ambulate increased distance in hall, negotiated stairs, reviewed car transfers and performed HEP with written instruction provided and reviewed.  Pt's sister present for session.  Pt eager for dc home this date.    If plan is discharge home, recommend the following: A little help with walking and/or transfers;A little help with bathing/dressing/bathroom;Assistance with cooking/housework;Help with stairs or ramp for entrance   Can travel by private vehicle        Equipment Recommendations  None recommended by PT    Recommendations for Other Services       Precautions / Restrictions Precautions Precautions: Fall Restrictions Weight Bearing Restrictions Per Provider Order: No RLE Weight Bearing Per Provider Order: Weight bearing as tolerated     Mobility  Bed Mobility               General bed mobility comments: pt sitting on window bench on arrival and up in recliner at session end    Transfers Overall transfer level: Needs assistance Equipment used: Rolling walker (2 wheels) Transfers: Sit to/from Stand Sit to Stand: Contact guard assist, Supervision           General transfer comment: cues for  hand placement and RLE position    Ambulation/Gait Ambulation/Gait assistance: Contact guard assist, Supervision Gait Distance (Feet): 100 Feet Assistive device: Rolling walker (2 wheels) Gait Pattern/deviations: Step-to pattern, Step-through pattern Gait velocity: decr     General Gait Details: cues for sequence and RW position intitially. able to progress to step though pattern   Stairs Stairs: Yes Stairs assistance: Min assist Stair Management: No rails, Step to pattern, Forwards,  With walker Number of Stairs: 1 General stair comments: cues for sequence   Wheelchair Mobility     Tilt Bed    Modified Rankin (Stroke Patients Only)       Balance Overall balance assessment: Needs assistance Sitting-balance support: No upper extremity supported, Feet supported Sitting balance-Leahy Scale: Good     Standing balance support: No upper extremity supported Standing balance-Leahy Scale: Fair                              Hotel manager: No apparent difficulties  Cognition Arousal: Alert Behavior During Therapy: WFL for tasks assessed/performed   PT - Cognitive impairments: No apparent impairments                         Following commands: Intact      Cueing Cueing Techniques: Verbal cues  Exercises Total Joint Exercises Ankle Circles/Pumps: AROM, Both, 15 reps Quad Sets: AROM, Strengthening, Both, 10 reps Heel Slides: Right, AAROM, 15 reps, Supine Hip ABduction/ADduction: AAROM, Right, 10 reps Long Arc Quad: AROM, Right, 10 reps, Seated    General Comments        Pertinent Vitals/Pain Pain Assessment Pain Assessment: 0-10 Pain Score: 6  Pain Location: right hip Pain Descriptors / Indicators: Aching, Sore, Guarding Pain Intervention(s): Limited activity within patient's tolerance, Monitored during session, Premedicated before session (pt declined ice)    Home Living  Prior Function            PT Goals (current goals can now be found in the care plan section) Acute Rehab PT Goals Patient Stated Goal: Regain IND PT Goal Formulation: With patient Time For Goal Achievement: 07/12/23 Potential to Achieve Goals: Good Progress towards PT goals: Progressing toward goals    Frequency    7X/week      PT Plan      Co-evaluation              AM-PAC PT "6 Clicks" Mobility   Outcome Measure  Help needed turning from your back to your side while  in a flat bed without using bedrails?: A Little Help needed moving from lying on your back to sitting on the side of a flat bed without using bedrails?: A Little Help needed moving to and from a bed to a chair (including a wheelchair)?: A Little Help needed standing up from a chair using your arms (e.g., wheelchair or bedside chair)?: A Little Help needed to walk in hospital room?: A Little Help needed climbing 3-5 steps with a railing? : A Little 6 Click Score: 18    End of Session Equipment Utilized During Treatment: Gait belt Activity Tolerance: Patient tolerated treatment well Patient left: with call bell/phone within reach;with family/visitor present;in chair;with chair alarm set Nurse Communication: Mobility status PT Visit Diagnosis: Other abnormalities of gait and mobility (R26.89)     Time: 9604-5409 PT Time Calculation (min) (ACUTE ONLY): 32 min  Charges:    $Gait Training: 8-22 mins $Therapeutic Exercise: 8-22 mins PT General Charges $$ ACUTE PT VISIT: 1 Visit                     Thedora Finlay PT Acute Rehabilitation Services Pager [phone] Office [phone]    Ibrahim Mcpheeters 07/05/2023, 12:14 PM

## 2023-07-09 NOTE — Discharge Summary (Signed)
 Physician Discharge Summary   Patient ID: Andrea Byrd MRN: [id number] DOB/AGE: 11/17/1945 78 y.o.  Admit date: 07/02/2023 Discharge date: 07/05/2023  Primary Diagnosis: Osteoarthritis of the right hip   Admission Diagnoses:  Past Medical History:  Diagnosis Date   Anemia    Arthritis    Cervical cancer (HCC) 1977   Diverticulosis    Dry eyes    Endometriosis    Esophageal stricture    Gastritis    GERD (gastroesophageal reflux disease)    Glaucoma    Headache    Herpes simplex type 1 infection    Hiatal hernia    History of colon polyps    History of gallstones    Hypertension    IBS (irritable bowel syndrome)    Insomnia    Post-operative nausea and vomiting    Restless leg syndrome    Discharge Diagnoses:   Principal Problem:   OA (osteoarthritis) of hip Active Problems:   Osteoarthritis of right hip  Estimated body mass index is 36.84 kg/m as calculated from the following:   Height as of this encounter: 5\' 1"  (1.549 m).   Weight as of this encounter: 88.5 kg.  Procedure:  Procedure(s) (LRB): ARTHROPLASTY, HIP, TOTAL, ANTERIOR APPROACH (Right)   Consults: None  HPI: Andrea Byrd is a 78 y.o. female who has advanced end-  stage arthritis of their Right  hip with progressively worsening pain and  dysfunction.The patient has failed nonoperative management and presents for  total hip arthroplasty.   Laboratory Data: Admission on 07/02/2023, Discharged on 07/05/2023  Component Date Value Ref Range Status   ABO/RH(D) 07/02/2023    Final                   Value:B NEG Performed at [facility], [address], [place], [place] [address]    WBC 07/03/2023 14.0 (H)  4.0 - 10.5 K/uL Final   RBC 07/03/2023 2.92 (L)  3.87 - 5.11 MIL/uL Final   Hemoglobin 07/03/2023 7.3 (L)  12.0 - 15.0 g/dL Final   HCT 19/14/7829 24.2 (L)  36.0 - 46.0 % Final   MCV 07/03/2023 82.9  80.0 - 100.0 fL Final   MCH 07/03/2023 25.0 (L)  26.0 - 34.0 pg Final   MCHC  07/03/2023 30.2  30.0 - 36.0 g/dL Final   RDW 56/21/3086 15.7 (H)  11.5 - 15.5 % Final   Platelets 07/03/2023 286  150 - 400 K/uL Final   nRBC 07/03/2023 0.0  0.0 - 0.2 % Final   Performed at [facility], [address] [place], [place] [address]   Sodium 07/03/2023 137  135 - 145 mmol/L Final   Potassium 07/03/2023 4.5  3.5 - 5.1 mmol/L Final   Chloride 07/03/2023 101  98 - 111 mmol/L Final   CO2 07/03/2023 29  22 - 32 mmol/L Final   Glucose, Bld 07/03/2023 128 (H)  70 - 99 mg/dL Final   Glucose reference range applies only to samples taken after fasting for at least 8 hours.   BUN 07/03/2023 20  8 - 23 mg/dL Final   Creatinine, Ser 07/03/2023 0.84  0.44 - 1.00 mg/dL Final   Calcium 96/29/5284 8.7 (L)  8.9 - 10.3 mg/dL Final   GFR, Estimated 07/03/2023 >60  >60 mL/min Final   Comment: (NOTE) Calculated using the CKD-EPI Creatinine Equation (2021)    Anion gap 07/03/2023 7  5 - 15 Final   Performed at [facility], [address] [place], [place] [address]   WBC 07/03/2023 13.8 (H)  4.0 - 10.5 K/uL Final   RBC 07/03/2023 2.99 (L)  3.87 - 5.11 MIL/uL Final   Hemoglobin 07/03/2023 7.3 (L)  12.0 - 15.0 g/dL Final   HCT 24/40/1027 25.1 (L)  36.0 - 46.0 % Final   MCV 07/03/2023 83.9  80.0 - 100.0 fL Final   MCH 07/03/2023 24.4 (L)  26.0 - 34.0 pg Final   MCHC 07/03/2023 29.1 (L)  30.0 - 36.0 g/dL Final   RDW 25/36/6440 15.9 (H)  11.5 - 15.5 % Final   Platelets 07/03/2023 274  150 - 400 K/uL Final   nRBC 07/03/2023 0.0  0.0 - 0.2 % Final   Performed at [facility], [address], [place], [place] [address]   WBC 07/04/2023 12.8 (H)  4.0 - 10.5 K/uL Final   RBC 07/04/2023 2.71 (L)  3.87 - 5.11 MIL/uL Final   Hemoglobin 07/04/2023 6.8 (LL)  12.0 - 15.0 g/dL Final   Comment: REPEATED TO VERIFY THIS CRITICAL RESULT HAS VERIFIED AND BEEN CALLED TO V. ESTABON, RN BY KATIE DAVIS ON 04 30 2025 AT 0500, AND HAS BEEN READ BACK.      HCT 07/04/2023 22.8 (L)  36.0 - 46.0 % Final   MCV 07/04/2023 84.1  80.0 - 100.0 fL Final   MCH 07/04/2023 25.1 (L)  26.0 - 34.0 pg Final   MCHC 07/04/2023 29.8 (L)  30.0 - 36.0 g/dL Final   RDW 59/56/3875 15.9 (H)  11.5 - 15.5 % Final   Platelets 07/04/2023 251  150 - 400 K/uL Final   nRBC 07/04/2023 0.0  0.0 - 0.2 % Final   Performed at [facility], [address], [place], [place] [address]   Order Confirmation 07/04/2023    Final                   Value:ORDER PROCESSED BY BLOOD BANK Performed at [facility], [address], [place], [place] [address]    Hemoglobin 07/04/2023 9.3 (L)  12.0 - 15.0 g/dL Final   Comment: REPEATED TO VERIFY POST TRANSFUSION SPECIMEN DELTA CHECK NOTED    HCT 07/04/2023 29.7 (L)  36.0 - 46.0 % Final   Performed at [facility], [address], [place], [place] [address]  Hospital Outpatient Visit on 06/28/2023  Component Date Value Ref Range Status   MRSA, PCR 06/28/2023 NEGATIVE  NEGATIVE Final   Staphylococcus aureus 06/28/2023 NEGATIVE  NEGATIVE Final   Comment: (NOTE) The Xpert SA Assay (FDA approved for NASAL specimens in patients 56 years of age and older), is one component of a comprehensive surveillance program. It is not intended to diagnose infection nor to guide or monitor treatment. Performed at [facility], [address], [place], [place] [address]    Sodium 06/28/2023 138  135 - 145 mmol/L Final   Potassium 06/28/2023 3.8  3.5 - 5.1 mmol/L Final   Chloride 06/28/2023 100  98 - 111 mmol/L Final   CO2 06/28/2023 29  22 - 32 mmol/L Final   Glucose, Bld 06/28/2023 95  70 - 99 mg/dL Final   Glucose reference range applies only to samples taken after fasting for at least 8 hours.   BUN 06/28/2023 19  8 - 23 mg/dL Final   Creatinine, Ser 06/28/2023 0.92  0.44 - 1.00 mg/dL Final   Calcium 03/14/3233 9.7  8.9 - 10.3 mg/dL Final   GFR,  Estimated 06/28/2023 >60   >60 mL/min Final   Comment: (NOTE) Calculated using the CKD-EPI Creatinine Equation (2021)    Anion gap 06/28/2023 9  5 - 15 Final   Performed at [facility], [address], [place], [place] [address]   WBC 06/28/2023 7.6  4.0 - 10.5 K/uL Final   RBC 06/28/2023 4.10  3.87 - 5.11 MIL/uL Final   Hemoglobin 06/28/2023 10.0 (L)  12.0 - 15.0 g/dL Final   HCT 96/29/5284 34.2 (L)  36.0 - 46.0 % Final   MCV 06/28/2023 83.4  80.0 - 100.0 fL Final   MCH 06/28/2023 24.4 (L)  26.0 - 34.0 pg Final   MCHC 06/28/2023 29.2 (L)  30.0 - 36.0 g/dL Final   RDW 13/24/4010 15.9 (H)  11.5 - 15.5 % Final   Platelets 06/28/2023 375  150 - 400 K/uL Final   nRBC 06/28/2023 0.0  0.0 - 0.2 % Final   Performed at [facility], [address] [place], [place] [address]   ABO/RH(D) 06/28/2023 B NEG   Final   Antibody Screen 06/28/2023 NEG   Final   Sample Expiration 06/28/2023 07/05/2023,2359   Final   Extend sample reason 06/28/2023 NO TRANSFUSIONS OR PREGNANCY IN THE PAST 3 MONTHS   Final   Unit Number 06/28/2023 [id number]   Final   Blood Component Type 06/28/2023 RED CELLS,LR   Final   Unit division 06/28/2023 00   Final   Status of Unit 06/28/2023 [facility]   Final   Transfusion Status 06/28/2023 OK TO TRANSFUSE   Final   Crossmatch Result 06/28/2023    Final                   Value:Compatible Performed at [facility], [address] [place], [place] [address]    Unit Number 06/28/2023 [id number]   Final   Blood Component Type 06/28/2023 RED CELLS,LR   Final   Unit division 06/28/2023 00   Final   Status of Unit 06/28/2023 ISSUED,FINAL   Final   Transfusion Status 06/28/2023 OK TO TRANSFUSE   Final   Crossmatch Result 06/28/2023 Compatible   Final   ISSUE DATE / TIME 06/28/2023 [id number]   Final   Blood Product Unit Number 06/28/2023 [id number]   Final   PRODUCT CODE 06/28/2023 [id number]   Final   Unit Type and Rh 06/28/2023 1700    Final   Blood Product Expiration Date 06/28/2023 [id number]   Final   ISSUE DATE / TIME 06/28/2023 [id number]   Final   Blood Product Unit Number 06/28/2023 [id number]   Final   PRODUCT CODE 06/28/2023 [id number]   Final   Unit Type and Rh 06/28/2023 1700   Final   Blood Product Expiration Date 06/28/2023 [id number]   Final     X-Rays:DG HIP PORT UNILAT WITH PELVIS 1V RIGHT Result Date: 07/02/2023 CLINICAL DATA:  Elective surgery. EXAM: DG HIP (WITH OR WITHOUT PELVIS) 1V PORT RIGHT COMPARISON:  None Available. FINDINGS: Two fluoroscopic spot views of the pelvis and right hip obtained in the operating room. Sequential images during hip arthroplasty. Fluoroscopy time 6 seconds. Dose 0.63 mGy. IMPRESSION: Intraoperative fluoroscopy during right hip arthroplasty. Electronically Signed   By: Chadwick Colonel M.D.   On: 07/02/2023 16:25   DG Pelvis Portable Result Date: 07/02/2023 CLINICAL DATA:  Postop. EXAM: PORTABLE PELVIS 1-2 VIEWS COMPARISON:  Preoperative radiograph FINDINGS: Right hip arthroplasty. 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 Recent postsurgical change includes air and edema in the  soft tissues. IMPRESSION: Right hip arthroplasty.  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 Electronically Signed   By: Chadwick Colonel M.D.   On: 07/02/2023 16:24   DG C-Arm 1-60 Min-No Report Result Date: 07/02/2023 Fluoroscopy was utilized by the requesting physician.  No radiographic interpretation.    EKG: Orders placed or performed during the hospital encounter of 11/03/21   EKG 12 lead per protocol   EKG 12 lead per protocol     Hospital Course: Andrea Byrd is a 78 y.o. who was admitted to [facility]. They were brought to the operating room on 07/02/2023 and underwent Procedure(s): ARTHROPLASTY, HIP, TOTAL, ANTERIOR APPROACH.  Patient tolerated the procedure well and was later transferred to the recovery room and then to the orthopaedic floor for postoperative care. They were  given PO and IV analgesics for pain control following their surgery. They were given 24 hours of postoperative antibiotics of  Anti-infectives (From admission, onward)    Start     Dose/Rate Route Frequency Ordered Stop   07/02/23 1830  ceFAZolin  (ANCEF ) IVPB 2g/100 mL premix        2 g 200 mL/hr over 30 Minutes Intravenous Every 6 hours 07/02/23 1653 07/03/23 0045   07/02/23 1100  ceFAZolin  (ANCEF ) IVPB 2g/100 mL premix        2 g 200 mL/hr over 30 Minutes Intravenous On call to O.R. 07/02/23 1056 07/02/23 1322      and started on DVT prophylaxis in the form of Aspirin .   PT and OT were ordered for total joint protocol. Discharge planning consulted to help with post-op disposition and equipment needs. Patient had a fair night on the evening of surgery. They started to get up OOB with physical therapy on POD #1. Continued to work with physical therapy into POD #3. Patient was seen during rounds on day three and was ready to go home pending progress with physical therapy. Patient worked with physical therapy for a total of 5 sessions and was meeting their goals. Dressing was changed and the incision was C/D/I.  They were discharged home later that day in stable condition.  Diet: Regular diet Activity: WBAT Follow-up: in 2 weeks Disposition: Home Discharged Condition: stable   Discharge Instructions     Call MD / Call 911   Complete by: As directed    If you experience chest pain or shortness of breath, CALL 911 and be transported to the hospital emergency room.  If you develope a fever above 101 F, pus (white drainage) or increased drainage or redness at the wound, or calf pain, call your surgeon's office.   Change dressing   Complete by: As directed    You have an adhesive waterproof bandage over the incision. Leave this in place until your first follow-up appointment. Once you remove this you will not need to place another bandage.   Constipation Prevention   Complete by: As directed     Drink plenty of fluids.  Prune juice may be helpful.  You may use a stool softener, such as Colace (over the counter) 100 mg twice a day.  Use MiraLax  (over the counter) for constipation as needed.   Diet - low sodium heart healthy   Complete by: As directed    Do not sit on low chairs, stoools or toilet seats, as it may be difficult to get up from low surfaces   Complete by: As directed    Driving restrictions   Complete by: As directed  No driving for two weeks   Post-operative opioid taper instructions:   Complete by: As directed    POST-OPERATIVE OPIOID TAPER INSTRUCTIONS: It is important to wean off of your opioid medication as soon as possible. If you do not need pain medication after your surgery it is ok to stop day one. Opioids include: Codeine, Hydrocodone (Norco, Vicodin), Oxycodone (Percocet, oxycontin ) and hydromorphone  amongst others.  Long term and even short term use of opiods can cause: Increased pain response Dependence Constipation Depression Respiratory depression And more.  Withdrawal symptoms can include Flu like symptoms Nausea, vomiting And more Techniques to manage these symptoms Hydrate well Eat regular healthy meals Stay active Use relaxation techniques(deep breathing, meditating, yoga) Do Not substitute Alcohol to help with tapering If you have been on opioids for less than two weeks and do not have pain than it is ok to stop all together.  Plan to wean off of opioids This plan should start within one week post op of your joint replacement. Maintain the same interval or time between taking each dose and first decrease the dose.  Cut the total daily intake of opioids by one tablet each day Next start to increase the time between doses. The last dose that should be eliminated is the evening dose.      TED hose   Complete by: As directed    Use stockings (TED hose) for three weeks on both leg(s).  You may remove them at night for sleeping.    Weight bearing as tolerated   Complete by: As directed       Allergies as of 07/05/2023       Reactions   Nitrofurantoin    Makes mouth break out/blisters   Propoxyphene N-acetaminophen     REACTION: nausea   Nexium  [esomeprazole  Magnesium ] Nausea And Vomiting   Nortriptyline  Hcl Nausea And Vomiting   Hydrocodone  Bit-homatrop Mbr Itching, Rash   Morphine  Itching, Rash        Medication List     STOP taking these medications    meloxicam  15 MG tablet Commonly known as: MOBIC    naproxen 500 MG tablet Commonly known as: NAPROSYN   tiZANidine  4 MG tablet Commonly known as: ZANAFLEX        TAKE these medications    aspirin  81 MG chewable tablet Chew 1 tablet (81 mg total) by mouth 2 (two) times daily for 21 days. Then take one 81 mg aspirin  once a day for three weeks. Then discontinue aspirin .   FLUoxetine  20 MG capsule Commonly known as: PROZAC  Take 1 capsule (20 mg total) by mouth daily.   furosemide  20 MG tablet Commonly known as: LASIX  TAKE 1 TABLET BY MOUTH EVERY DAY   HYDROcodone -acetaminophen  5-325 MG tablet Commonly known as: NORCO/VICODIN Take 1-2 tablets by mouth every 6 (six) hours as needed for severe pain (pain score 7-10).   Klor-Con  M20 20 MEQ tablet Generic drug: potassium chloride  SA TAKE 1 TABLET BY MOUTH EVERY DAY   latanoprost  0.005 % ophthalmic solution Commonly known as: XALATAN  Place 1 drop into both eyes at bedtime.   losartan -hydrochlorothiazide  50-12.5 MG tablet Commonly known as: HYZAAR TAKE 1 TABLET BY MOUTH EVERY DAY   methocarbamol  500 MG tablet Commonly known as: ROBAXIN  Take 1 tablet (500 mg total) by mouth every 6 (six) hours as needed for muscle spasms.   omeprazole  20 MG capsule Commonly known as: PRILOSEC TAKE 1 CAPSULE 2 TIMES DAILY 30-60 MINUTES BEFORE BREAKFAST AND DINNER.   ondansetron  4 MG tablet Commonly known as:  ZOFRAN  Take 1 tablet (4 mg total) by mouth every 6 (six) hours as needed for nausea.    pramipexole  1 MG tablet Commonly known as: MIRAPEX  Take 1 tablet (1 mg total) by mouth 2 (two) times daily. What changed:  how much to take when to take this   Vitamin D  (Ergocalciferol ) 1.25 MG (50000 UNIT) Caps capsule Commonly known as: DRISDOL  Take 50,000 Units by mouth once a week.               Discharge Care Instructions  (From admission, onward)           Start     Ordered   07/05/23 0000  Weight bearing as tolerated        07/05/23 0810   07/05/23 0000  Change dressing       Comments: You have an adhesive waterproof bandage over the incision. Leave this in place until your first follow-up appointment. Once you remove this you will not need to place another bandage.   07/05/23 0810            Follow-up Information     Liliane Rei, MD. Go on 07/18/2023.   Specialty: Orthopedic Surgery Why: You have a post op appointment scheduled for Wednesday 07/18/23 at 3:15pm Contact information: [address] [place] [address] [phone]                 Signed: R. Brinton Canavan, PA-C Orthopedic Surgery 07/09/2023, 7:28 AM

## 2023-07-10 NOTE — Addendum Note (Signed)
 Addended by: Genette Kent on: 07/10/2023 03:34 PM   Modules accepted: Orders

## 2023-07-11 ENCOUNTER — Ambulatory Visit: Payer: Self-pay

## 2023-07-11 ENCOUNTER — Institutional Professional Consult (permissible substitution) (INDEPENDENT_AMBULATORY_CARE_PROVIDER_SITE_OTHER)

## 2023-07-11 NOTE — Telephone Encounter (Signed)
 FYI Patient is refusing to go to ED or UC.

## 2023-07-11 NOTE — Telephone Encounter (Signed)
 Chief Complaint: dizziness Symptoms: see above  Frequency: worse after total hip replacement, discharged 5/1 Pertinent Negatives: Patient denies fever, CP, SOB, unsteady gait, falls, palpitations, bleeding Disposition: [] ED /[x] Urgent Care (no appt availability in office) / [] Appointment(In office/virtual)/ []  [facility]/ [] Home Care/ [x] Refused Recommended Disposition /[] [facility]/ []  Follow-up with PCP Additional Notes: Pt reports dizziness. Pt states dizziness started before her hip replacement but has worsened. Pt was discharged 5/1 after hip replacement surgery. Pt's Hgb was low and she was given 2 units of blood on 4/30. Pt feels like her Hgb is getting lower again. Pt endorses consistent lightheadedness but states she is still able to walk and use her walker. Pt denies falls. Pt denies CP, SOB, bleeding, fever, visual changes, one-sided weakness. No missed doses of BP meds.  Pt reports she had diarrhea with nausea and vomiting yesterday. Pt states those symptoms have resolved. Pt's PO intake has been minimal today. Pt states she has eaten saltines, Pepsi, and orange juice today.  Pt needs a HFU visit but she's also symptomatic. No HFU visit available with pt's PCP until Monday. Pt should be seen before Monday and within 24 hours. RN called the CAL. CAL stated that pt cannot be scheduled for an acute visit until they have a HFU visit, and the HFU must be with their PCP.   RN advised pt that she should go to an UC or the ED in the next 24 hours. Pt declined, stating she does not want to sit and wait while she is recovering from hip surgery. RN advised pt RN would relay concern to the clinic for follow-up. RN advised pt if she develops worsening dizziness and becomes unable to walk or develops CP or SOB she should call 911. Pt verbalized understanding.     Copied from CRM [id number]. Topic: Clinical - Pink Word Triage >> Jul 11, 2023  1:11 PM Andrea Byrd wrote: Reason for  Triage: patient had a hip replacement and her hemoglobin level was low and its s till low and  has dropped lower she is not feeling well she would like a call back Reason for Disposition  [1] MODERATE dizziness (e.g., interferes with normal activities) AND [2] has NOT been evaluated by doctor (or NP/PA) for this  (Exception: Dizziness caused by heat exposure, sudden standing, or poor fluid intake.)  Answer Assessment - Initial Assessment Questions 1. DESCRIPTION: "Describe your dizziness."     Lightheaded  2. LIGHTHEADED: "Do you feel lightheaded?" (e.g., somewhat faint, woozy, weak upon standing)     Yes  3. VERTIGO: "Do you feel like either you or the room is spinning or tilting?" (i.e. vertigo)     No  4. SEVERITY: "How bad is it?"  "Do you feel like you are going to faint?" "Can you stand and walk?"   - MILD: Feels slightly dizzy, but walking normally.   - MODERATE: Feels unsteady when walking, but not falling; interferes with normal activities (e.g., school, work).   - SEVERE: Unable to walk without falling, or requires assistance to walk without falling; feels like passing out now.      States she is using a walker, denies falls 5. ONSET:  "When did the dizziness begin?"     Pt reports dizziness began even before her hip replacement. She received 2 units of blood 4/30. 6.8 --> 9.3 6. AGGRAVATING FACTORS: "Does anything make it worse?" (e.g., standing, change in head position)     "Quick movements" 7. HEART  RATE: "Can you tell me your heart rate?" "How many beats in 15 seconds?"  (Note: not all patients can do this)       73 HR, 140/67, 97.64F 8. CAUSE: "What do you think is causing the dizziness?"     Low hemoglobin  9. RECURRENT SYMPTOM: "Have you had dizziness before?" If Yes, ask: "When was the last time?" "What happened that time?"     Yes, even since before her surgery 10. OTHER SYMPTOMS: "Do you have any other symptoms?" (e.g., fever, chest pain, vomiting, diarrhea,  bleeding) Discharged 5/1 after hip replacement. Denies CP or SOB. Denies bleeding. Pt states she was vomiting yesterday with diarrhea. Denies vomiting today. Denies diarrhea today. Diarrhea was very watery yesterday. Denies abdominal pain. Pt states she had saltines today, part of a Pepsi, and orange juice. Denies fever, chills. Denies visual changes. Denies one-sided weakness. Taking bp meds  Protocols used: Dizziness - Lightheadedness-A-AH

## 2023-07-11 NOTE — Telephone Encounter (Signed)
 Called patient she is schedule with you tomorrow, did relay to patient if she seems to be getting worse she does need to be seen at ED or UC as instructed earlier.

## 2023-07-11 NOTE — Telephone Encounter (Signed)
 Pt absolutely needs to be seen if she is concerned for hemoglobin dropping.  The lightheadedness she is having may be related to dehydration from diarrhea and vomiting yesterday, but we need to make sure blood count looks ok.  She needs an acute visit for lightheadedness.  I have a same day available tomorrow or she can be scheduled w/ another provider for this acute issue

## 2023-07-12 ENCOUNTER — Encounter: Payer: Self-pay | Admitting: Family Medicine

## 2023-07-12 ENCOUNTER — Inpatient Hospital Stay: Admitting: Student in an Organized Health Care Education/Training Program

## 2023-07-12 ENCOUNTER — Ambulatory Visit (INDEPENDENT_AMBULATORY_CARE_PROVIDER_SITE_OTHER): Admitting: Family Medicine

## 2023-07-12 VITALS — BP 114/58 | HR 89 | Temp 98.0°F | Ht 61.0 in | Wt 194.2 lb

## 2023-07-12 DIAGNOSIS — R197 Diarrhea, unspecified: Secondary | ICD-10-CM

## 2023-07-12 DIAGNOSIS — D500 Iron deficiency anemia secondary to blood loss (chronic): Secondary | ICD-10-CM | POA: Diagnosis not present

## 2023-07-12 DIAGNOSIS — R5383 Other fatigue: Secondary | ICD-10-CM

## 2023-07-12 DIAGNOSIS — R1110 Vomiting, unspecified: Secondary | ICD-10-CM | POA: Diagnosis not present

## 2023-07-12 DIAGNOSIS — R42 Dizziness and giddiness: Secondary | ICD-10-CM

## 2023-07-12 LAB — CBC WITH DIFFERENTIAL/PLATELET
Basophils Absolute: 0.1 10*3/uL (ref 0.0–0.1)
Basophils Relative: 0.8 % (ref 0.0–3.0)
Eosinophils Absolute: 0.2 10*3/uL (ref 0.0–0.7)
Eosinophils Relative: 2.9 % (ref 0.0–5.0)
HCT: 31.1 % — ABNORMAL LOW (ref 36.0–46.0)
Hemoglobin: 10.1 g/dL — ABNORMAL LOW (ref 12.0–15.0)
Lymphocytes Relative: 13.8 % (ref 12.0–46.0)
Lymphs Abs: 1.1 10*3/uL (ref 0.7–4.0)
MCHC: 32.6 g/dL (ref 30.0–36.0)
MCV: 80.7 fl (ref 78.0–100.0)
Monocytes Absolute: 0.6 10*3/uL (ref 0.1–1.0)
Monocytes Relative: 7.6 % (ref 3.0–12.0)
Neutro Abs: 5.9 10*3/uL (ref 1.4–7.7)
Neutrophils Relative %: 74.9 % (ref 43.0–77.0)
Platelets: 417 10*3/uL — ABNORMAL HIGH (ref 150.0–400.0)
RBC: 3.86 Mil/uL — ABNORMAL LOW (ref 3.87–5.11)
RDW: 17.1 % — ABNORMAL HIGH (ref 11.5–15.5)
WBC: 7.9 10*3/uL (ref 4.0–10.5)

## 2023-07-12 LAB — BASIC METABOLIC PANEL WITH GFR
BUN: 12 mg/dL (ref 6–23)
CO2: 31 meq/L (ref 19–32)
Calcium: 9.2 mg/dL (ref 8.4–10.5)
Chloride: 98 meq/L (ref 96–112)
Creatinine, Ser: 0.87 mg/dL (ref 0.40–1.20)
GFR: 64.34 mL/min (ref >60.00)
Glucose, Bld: 106 mg/dL — ABNORMAL HIGH (ref 70–99)
Potassium: 4.1 meq/L (ref 3.5–5.1)
Sodium: 136 meq/L (ref 135–145)

## 2023-07-12 LAB — TSH: TSH: 2.49 u[IU]/mL (ref 0.35–5.50)

## 2023-07-12 LAB — HEPATIC FUNCTION PANEL
ALT: 16 U/L (ref 0–35)
AST: 20 U/L (ref 0–37)
Albumin: 3.6 g/dL (ref 3.5–5.2)
Alkaline Phosphatase: 104 U/L (ref 39–117)
Bilirubin, Direct: 0.1 mg/dL (ref 0.0–0.3)
Total Bilirubin: 0.3 mg/dL (ref 0.2–1.2)
Total Protein: 6.5 g/dL (ref 6.0–8.3)

## 2023-07-12 MED ORDER — SCOPOLAMINE 1 MG/3DAYS TD PT72: 1.0000 | MEDICATED_PATCH | TRANSDERMAL | 0 refills | Status: DC

## 2023-07-12 MED ORDER — ONDANSETRON HCL 4 MG PO TABS: 4.0000 mg | ORAL_TABLET | Freq: Four times a day (QID) | ORAL | 0 refills | Status: DC | PRN

## 2023-07-12 NOTE — Progress Notes (Signed)
   Subjective:    Patient ID: Andrea Byrd, female    DOB: 11/30/1945, 78 y.o.   MRN: [id number]  HPI Fatigue- s/p R hip replcement on 4/28.  Remained in the hospital until 5/1 and had to get 2 units of blood due to hgb of 6.8  (was up to 9.3 at time of d/c)  Pt reports feeling dizzy.  Has been vomiting and having diarrhea.  Last vomited early Monday.  Admits to poor fluid intake.  Pt reports vertigo sxs- worse w/ sudden movements.  Taking Zofran  as needed for nausea.  No blood in stool.  'i feel awful'.   Review of Systems For ROS see HPI     Objective:   Physical Exam Vitals reviewed.  Constitutional:      General: She is not in acute distress.    Appearance: She is well-developed. She is ill-appearing.  HENT:     Head: Normocephalic and atraumatic.  Eyes:     Conjunctiva/sclera: Conjunctivae normal.     Pupils: Pupils are equal, round, and reactive to light.  Neck:     Thyroid : No thyromegaly.  Cardiovascular:     Rate and Rhythm: Normal rate and regular rhythm.     Heart sounds: Normal heart sounds. No murmur heard. Pulmonary:     Effort: Pulmonary effort is normal. No respiratory distress.     Breath sounds: Normal breath sounds.  Abdominal:     General: There is no distension.     Palpations: Abdomen is soft.     Tenderness: There is no abdominal tenderness.  Musculoskeletal:     Cervical back: Normal range of motion and neck supple.  Lymphadenopathy:     Cervical: No cervical adenopathy.  Skin:    General: Skin is warm and dry.     Coloration: Skin is pale.  Neurological:     Mental Status: She is alert and oriented to person, place, and time.  Psychiatric:        Behavior: Behavior normal.           Assessment & Plan:  Fatigue- new.  Likely multifactorial- recent surgery, anemia, dehydration after GI bug, not eating regularly, excess BP control.  Check labs to determine if there is anything underlying that needs to be corrected.  Will tx any abnormalities if  present  Vomiting and diarrhea- new.  Suspect GI bug.  Thankfully has not vomited since Monday but admits to poor PO intake.  Zofran  prn.  Stressed need for hydration and food.  Vertigo- new.  Unclear if this is due to recent anesthesia or if this predates surgery.  She has Zofran  to use prn.  Will add Scopolamine  patch to help w/ dizziness.  Pt expressed understanding and is in agreement w/ plan.   Anemia- pt's hgb dropped to 6.8 after surgery.  She received 2 units PRBCs and was up to 9.3 prior to d/c.  Will repeat labs to ensure she is not again dropping.

## 2023-07-12 NOTE — Patient Instructions (Addendum)
 Follow up in 2 weeks to recheck dizziness and blood pressure We'll notify you of your lab results and make any changes if needed DECREASE your Losartan  hydrochlorothiazide  to HALF tab daily INCREASE your water  intake TRY and eat regularly USE the nausea medicine as needed APPLY the patch behind your ear to help w/ dizziness.  Wash hands after applying Call with any questions or concerns Hang in there!!

## 2023-07-13 ENCOUNTER — Telehealth: Payer: Self-pay

## 2023-07-13 NOTE — Telephone Encounter (Signed)
-----   Message from Laymon Priest sent at 07/12/2023  5:47 PM EDT ----- Thankfully your labs look better than they did when you were in the hospital.  I think all of this is just taking a toll on you.  But no need for another blood transfusion at this time

## 2023-07-16 NOTE — Telephone Encounter (Signed)
 Lab results have been discussed.   Verbalized understanding? Yes  Are there any questions? No

## 2023-07-18 DIAGNOSIS — Z5189 Encounter for other specified aftercare: Secondary | ICD-10-CM | POA: Diagnosis not present

## 2023-08-08 DIAGNOSIS — M7061 Trochanteric bursitis, right hip: Secondary | ICD-10-CM | POA: Insufficient documentation

## 2023-08-19 ENCOUNTER — Other Ambulatory Visit: Payer: Self-pay | Admitting: Family Medicine

## 2023-08-23 DIAGNOSIS — H26492 Other secondary cataract, left eye: Secondary | ICD-10-CM | POA: Diagnosis not present

## 2023-08-23 DIAGNOSIS — Z961 Presence of intraocular lens: Secondary | ICD-10-CM | POA: Diagnosis not present

## 2023-08-23 DIAGNOSIS — H401232 Low-tension glaucoma, bilateral, moderate stage: Secondary | ICD-10-CM | POA: Diagnosis not present

## 2023-08-28 ENCOUNTER — Ambulatory Visit: Payer: Medicare HMO | Admitting: Family Medicine

## 2023-08-30 ENCOUNTER — Encounter: Payer: Self-pay | Admitting: Family Medicine

## 2023-08-31 ENCOUNTER — Other Ambulatory Visit: Payer: Self-pay | Admitting: Orthopedic Surgery

## 2023-08-31 DIAGNOSIS — Z5189 Encounter for other specified aftercare: Secondary | ICD-10-CM

## 2023-09-03 ENCOUNTER — Ambulatory Visit
Admission: RE | Admit: 2023-09-03 | Discharge: 2023-09-03 | Disposition: A | Discharge status: Home / Self Care | Source: Ambulatory Visit | Attending: Orthopedic Surgery | Admitting: Orthopedic Surgery

## 2023-09-03 DIAGNOSIS — R262 Difficulty in walking, not elsewhere classified: Secondary | ICD-10-CM | POA: Diagnosis not present

## 2023-09-03 DIAGNOSIS — Z5189 Encounter for other specified aftercare: Secondary | ICD-10-CM

## 2023-09-03 DIAGNOSIS — Z96641 Presence of right artificial hip joint: Secondary | ICD-10-CM | POA: Diagnosis not present

## 2023-09-03 DIAGNOSIS — M25551 Pain in right hip: Secondary | ICD-10-CM | POA: Diagnosis not present

## 2023-11-01 ENCOUNTER — Ambulatory Visit (INDEPENDENT_AMBULATORY_CARE_PROVIDER_SITE_OTHER): Admitting: Family Medicine

## 2023-11-01 ENCOUNTER — Encounter: Payer: Self-pay | Admitting: Family Medicine

## 2023-11-01 VITALS — BP 120/62 | HR 77 | Temp 97.8°F | Ht 61.0 in | Wt 195.0 lb

## 2023-11-01 DIAGNOSIS — R82998 Other abnormal findings in urine: Secondary | ICD-10-CM

## 2023-11-01 DIAGNOSIS — E669 Obesity, unspecified: Secondary | ICD-10-CM | POA: Diagnosis not present

## 2023-11-01 DIAGNOSIS — M545 Low back pain, unspecified: Secondary | ICD-10-CM

## 2023-11-01 DIAGNOSIS — I10 Essential (primary) hypertension: Secondary | ICD-10-CM | POA: Diagnosis not present

## 2023-11-01 LAB — POCT URINALYSIS DIPSTICK
Bilirubin, UA: NEGATIVE
Blood, UA: NEGATIVE
Glucose, UA: NEGATIVE
Ketones, UA: NEGATIVE
Nitrite, UA: NEGATIVE
Protein, UA: NEGATIVE
Spec Grav, UA: 1.01 (ref 1.010–1.025)
Urobilinogen, UA: 0.2 U/dL
pH, UA: 6 (ref 5.0–8.0)

## 2023-11-01 NOTE — Assessment & Plan Note (Signed)
 Ongoing issue.  Weight and BMI are stable.  She has not been able to exercise due to recent hip replacement.  Encouraged low carb/low sugar diet and regular physical activity as able.

## 2023-11-01 NOTE — Assessment & Plan Note (Signed)
 Chronic problem.  Currently well controlled on Losartan  hydrochlorothiazide  and Lasix .  Currently asymptomatic.  Check labs due to ARB and diuretic use but no anticipated med changes.

## 2023-11-01 NOTE — Progress Notes (Signed)
   Subjective:    Patient ID: Andrea Byrd, female    DOB: Jan 24, 1946, 78 y.o.   MRN: [id number]  HPI HTN- chronic problem, on Losartan  hydrochlorothiazide  50/12.5mg  daily, Lasix  20mg  daily w/ good control.  Denies CP, SOB, HA's, visual changes, edema.  Obesity- weight and BMI are stable at 195 lbs and 36.84  Limited exercise due to hip replacement.  LBP- sxs started in June.  No burning w/ urination.  States there may be blood in urine at times but is unsure.     Review of Systems For ROS see HPI     Objective:   Physical Exam Vitals reviewed.  Constitutional:      General: She is not in acute distress.    Appearance: Normal appearance. She is well-developed. She is obese. She is not ill-appearing.  HENT:     Head: Normocephalic and atraumatic.  Eyes:     Conjunctiva/sclera: Conjunctivae normal.     Pupils: Pupils are equal, round, and reactive to light.  Neck:     Thyroid : No thyromegaly.  Cardiovascular:     Rate and Rhythm: Normal rate and regular rhythm.     Pulses: Normal pulses.     Heart sounds: Normal heart sounds. No murmur heard. Pulmonary:     Effort: Pulmonary effort is normal. No respiratory distress.     Breath sounds: Normal breath sounds.  Abdominal:     General: There is no distension.     Palpations: Abdomen is soft.     Tenderness: There is no abdominal tenderness.  Musculoskeletal:     Cervical back: Normal range of motion and neck supple.     Right lower leg: No edema.     Left lower leg: No edema.  Lymphadenopathy:     Cervical: No cervical adenopathy.  Skin:    General: Skin is warm and dry.  Neurological:     General: No focal deficit present.     Mental Status: She is alert and oriented to person, place, and time.  Psychiatric:        Mood and Affect: Mood normal.        Behavior: Behavior normal.           Assessment & Plan:  LBP- new.  Pt has hx of lumbar radiculopathy but reports this feels different.  UA w/ leuks.  Will hold on  abx until cx results available given absence of other sxs.  Pt expressed understanding and is in agreement w/ plan.

## 2023-11-01 NOTE — Patient Instructions (Addendum)
Schedule your complete physical in 6 months We'll notify you of your lab results and make any changes if needed Continue to work on healthy diet and regular exercise- you can do it!! Call with any questions or concerns Stay Safe!  Stay Healthy! Hang in there!!! 

## 2023-11-02 LAB — BASIC METABOLIC PANEL WITH GFR
BUN: 14 mg/dL (ref 6–23)
CO2: 30 meq/L (ref 19–32)
Calcium: 9.5 mg/dL (ref 8.4–10.5)
Chloride: 97 meq/L (ref 96–112)
Creatinine, Ser: 0.85 mg/dL (ref 0.40–1.20)
GFR: 66.02 mL/min (ref >60.00)
Glucose, Bld: 94 mg/dL (ref 70–99)
Potassium: 4 meq/L (ref 3.5–5.1)
Sodium: 139 meq/L (ref 135–145)

## 2023-11-02 LAB — CBC WITH DIFFERENTIAL/PLATELET
Basophils Absolute: 0 K/uL (ref 0.0–0.1)
Basophils Relative: 0.6 % (ref 0.0–3.0)
Eosinophils Absolute: 0.1 K/uL (ref 0.0–0.7)
Eosinophils Relative: 1.3 % (ref 0.0–5.0)
HCT: 38.8 % (ref 36.0–46.0)
Hemoglobin: 12.6 g/dL (ref 12.0–15.0)
Lymphocytes Relative: 17.6 % (ref 12.0–46.0)
Lymphs Abs: 1.4 K/uL (ref 0.7–4.0)
MCHC: 32.5 g/dL (ref 30.0–36.0)
MCV: 83.4 fl (ref 78.0–100.0)
Monocytes Absolute: 0.5 K/uL (ref 0.1–1.0)
Monocytes Relative: 6.2 % (ref 3.0–12.0)
Neutro Abs: 5.7 K/uL (ref 1.4–7.7)
Neutrophils Relative %: 74.3 % (ref 43.0–77.0)
Platelets: 327 K/uL (ref 150.0–400.0)
RBC: 4.65 Mil/uL (ref 3.87–5.11)
RDW: 17.6 % — ABNORMAL HIGH (ref 11.5–15.5)
WBC: 7.7 K/uL (ref 4.0–10.5)

## 2023-11-02 LAB — HEPATIC FUNCTION PANEL
ALT: 19 U/L (ref 0–35)
AST: 23 U/L (ref 0–37)
Albumin: 4.1 g/dL (ref 3.5–5.2)
Alkaline Phosphatase: 114 U/L (ref 39–117)
Bilirubin, Direct: 0 mg/dL (ref 0.0–0.3)
Total Bilirubin: 0.4 mg/dL (ref 0.2–1.2)
Total Protein: 7.2 g/dL (ref 6.0–8.3)

## 2023-11-02 LAB — LIPID PANEL
Cholesterol: 183 mg/dL (ref 0–200)
HDL: 42 mg/dL (ref >39.00)
LDL Cholesterol: 108 mg/dL — ABNORMAL HIGH (ref 0–99)
NonHDL: 140.86
Total CHOL/HDL Ratio: 4
Triglycerides: 162 mg/dL — ABNORMAL HIGH (ref 0.0–149.0)
VLDL: 32.4 mg/dL (ref 0.0–40.0)

## 2023-11-02 LAB — TSH: TSH: 1.46 u[IU]/mL (ref 0.35–5.50)

## 2023-11-03 LAB — URINE CULTURE
MICRO NUMBER:: 16897632
SPECIMEN QUALITY:: ADEQUATE

## 2023-11-06 ENCOUNTER — Ambulatory Visit: Payer: Self-pay | Admitting: Family Medicine

## 2023-11-06 NOTE — Progress Notes (Signed)
 Pt has been notified.

## 2023-11-09 DIAGNOSIS — Z96641 Presence of right artificial hip joint: Secondary | ICD-10-CM | POA: Diagnosis not present

## 2023-11-25 ENCOUNTER — Other Ambulatory Visit: Payer: Self-pay | Admitting: Family Medicine

## 2023-11-25 ENCOUNTER — Other Ambulatory Visit: Payer: Self-pay | Admitting: Physician Assistant

## 2023-11-25 DIAGNOSIS — E876 Hypokalemia: Secondary | ICD-10-CM

## 2023-12-07 ENCOUNTER — Other Ambulatory Visit: Payer: Self-pay | Admitting: Family Medicine

## 2023-12-08 ENCOUNTER — Other Ambulatory Visit: Payer: Self-pay | Admitting: Family Medicine

## 2023-12-12 ENCOUNTER — Telehealth: Payer: Self-pay | Admitting: Family Medicine

## 2023-12-12 NOTE — Telephone Encounter (Signed)
 Myc  cxl

## 2023-12-27 DIAGNOSIS — Z96641 Presence of right artificial hip joint: Secondary | ICD-10-CM | POA: Diagnosis not present

## 2023-12-27 DIAGNOSIS — M549 Dorsalgia, unspecified: Secondary | ICD-10-CM | POA: Diagnosis not present

## 2024-01-15 ENCOUNTER — Encounter: Payer: Self-pay | Admitting: Family

## 2024-01-15 ENCOUNTER — Ambulatory Visit: Payer: Self-pay

## 2024-01-15 ENCOUNTER — Ambulatory Visit: Admitting: Family

## 2024-01-15 VITALS — BP 102/46 | HR 75 | Temp 99.2°F | Ht 61.0 in | Wt 195.2 lb

## 2024-01-15 DIAGNOSIS — R509 Fever, unspecified: Secondary | ICD-10-CM

## 2024-01-15 DIAGNOSIS — N390 Urinary tract infection, site not specified: Secondary | ICD-10-CM | POA: Diagnosis not present

## 2024-01-15 DIAGNOSIS — R350 Frequency of micturition: Secondary | ICD-10-CM

## 2024-01-15 DIAGNOSIS — A499 Bacterial infection, unspecified: Secondary | ICD-10-CM | POA: Diagnosis not present

## 2024-01-15 LAB — POCT URINALYSIS DIPSTICK
Glucose, UA: NEGATIVE
Ketones, UA: NEGATIVE
Nitrite, UA: NEGATIVE
Protein, UA: POSITIVE — AB
Spec Grav, UA: >=1.03 — AB (ref 1.010–1.025)
Urobilinogen, UA: 0.2 U/dL
pH, UA: 6 (ref 5.0–8.0)

## 2024-01-15 LAB — POCT INFLUENZA A/B
Influenza A, POC: NEGATIVE
Influenza B, POC: NEGATIVE

## 2024-01-15 LAB — POC COVID19 BINAXNOW: SARS Coronavirus 2 Ag: NEGATIVE

## 2024-01-15 MED ORDER — ONDANSETRON HCL 4 MG PO TABS: 4.0000 mg | ORAL_TABLET | Freq: Four times a day (QID) | ORAL | 0 refills | Status: AC | PRN

## 2024-01-15 MED ORDER — SULFAMETHOXAZOLE-TRIMETHOPRIM 800-160 MG PO TABS: 1.0000 | ORAL_TABLET | Freq: Two times a day (BID) | ORAL | 0 refills | Status: DC

## 2024-01-15 NOTE — Telephone Encounter (Signed)
 Spoke with pt and she states she is to dizzy to drive. I advised to call EMS and she refused this. Pt states she will go to ER later

## 2024-01-15 NOTE — Telephone Encounter (Signed)
 FYI

## 2024-01-15 NOTE — Progress Notes (Signed)
 Acute Office Visit  Subjective:     Patient ID: Andrea Byrd, female    DOB: 12-Aug-1945, 78 y.o.   MRN: [id number]  Chief Complaint  Patient presents with  . Chills  . Emesis  . Fever    101 on Sunday; along with cough w/some mucus and headache in back of head     HPI Patient is in today  with complaints of dizziness, vomiting, urinary frequency, body aches and low-grade fever that began 5 days ago and worsening.  Denies any sneezing, cough, or congestion.  Has been taking over-the-counter Tylenol  and Motrin that has not helped much.  Denies any abdominal pain or back pain.  Denies any known sick contacts.  Review of Systems  Constitutional:  Positive for chills, fever and malaise/fatigue.  HENT: Negative.    Respiratory: Negative.    Cardiovascular: Negative.   Gastrointestinal:  Positive for nausea and vomiting.  Genitourinary:  Positive for frequency. Negative for flank pain and hematuria.  Musculoskeletal:  Positive for myalgias.  Neurological:  Positive for dizziness.  Psychiatric/Behavioral: Negative.    All other systems reviewed and are negative.  Past Medical History:  Diagnosis Date  . Anemia   . Arthritis   . Cervical cancer (HCC) 1977  . Diverticulosis   . Dry eyes   . Endometriosis   . Esophageal stricture   . Gastritis   . GERD (gastroesophageal reflux disease)   . Glaucoma   . Headache   . Herpes simplex type 1 infection   . Hiatal hernia   . History of colon polyps   . History of gallstones   . Hypertension   . IBS (irritable bowel syndrome)   . Insomnia   . Post-operative nausea and vomiting   . Restless leg syndrome     Social History   Socioeconomic History  . Marital status: Widowed    Spouse name: Not on file  . Number of children: 1  . Years of education: Not on file  . Highest education level: Not on file  Occupational History  . Occupation: Retired  Tobacco Use  . Smoking status: Never  . Smokeless tobacco: Never  Vaping Use   . Vaping status: Never Used  Substance and Sexual Activity  . Alcohol use: No  . Drug use: No  . Sexual activity: Not Currently  Other Topics Concern  . Not on file  Social History Narrative   Lives with husband floyd   Drinks maybe 4 sodas a week   Retired    Chief Executive Officer Drivers of [employer]: Low Risk  (06/28/2022)   Overall Financial Resource Strain (CARDIA)   . Difficulty of Paying Living Expenses: Not hard at all  Food Insecurity: No Food Insecurity (07/02/2023)   Hunger Vital Sign   . Worried About Programme Researcher, Broadcasting/film/video in the Last Year: Never true   . Ran Out of Food in the Last Year: Never true  Transportation Needs: No Transportation Needs (07/02/2023)   PRAPARE - Transportation   . Lack of Transportation (Medical): No   . Lack of Transportation (Non-Medical): No  Physical Activity: Inactive (06/28/2022)   Exercise Vital Sign   . Days of Exercise per Week: 0 days   . Minutes of Exercise per Session: 0 min  Stress: No Stress Concern Present (06/28/2022)   [employer] of Occupational Health - Occupational Stress Questionnaire   . Feeling of Stress : Not at all  Social Connections: Moderately Integrated (07/02/2023)  Social Connection and Isolation Panel   . Frequency of Communication with Friends and Family: More than three times a week   . Frequency of Social Gatherings with Friends and Family: Three times a week   . Attends Religious Services: More than 4 times per year   . Active Member of Clubs or Organizations: Yes   . Attends Banker Meetings: More than 4 times per year   . Marital Status: Widowed  Intimate Partner Violence: Not At Risk (07/02/2023)   Humiliation, Afraid, Rape, and Kick questionnaire   . Fear of Current or Ex-Partner: No   . Emotionally Abused: No   . Physically Abused: No   . Sexually Abused: No    Past Surgical History:  Procedure Laterality Date  . ABDOMINAL HYSTERECTOMY  1998  . APPENDECTOMY  1998  .  BUNIONECTOMY WITH HAMMERTOE RECONSTRUCTION Right 11/03/2021   Procedure: Right bunion correction with double osteotomy; Second Metatarsal weil; Second hammertoe correction; third through fifth tendon lengthening; excision of bunionette;  Surgeon: Kit Rush, MD;  Location:  SUR[facility]ERY [facility];  Service: Orthopedics;  Laterality: Right;  . CERVICAL CONE BIOPSY    . CHOLECYSTECTOMY  1980  . COLON RESECTION  2002  . COLON SURGERY  2003   removed 2 feet colon  . ESOPHAGEAL MANOMETRY N/A 04/24/2016   Procedure: ESOPHAGEAL MANOMETRY (EM);  Surgeon: Gustav Shila GAILS, MD;  Location: [facility];  Service: Endoscopy;  Laterality: N/A;  . HAND SURGERY     right/ thumb joints replaced both hands  . OOPHORECTOMY    . right foot surgery Right 08/15/2022  . SHOULDER ARTHROSCOPY  02/28/2022  . TOTAL HIP ARTHROPLASTY Right 07/02/2023   Procedure: ARTHROPLASTY, HIP, TOTAL, ANTERIOR APPROACH;  Surgeon: Melodi Lerner, MD;  Location: [facility];  Service: Orthopedics;  Laterality: Right;  . TOTAL SHOULDER REPLACEMENT Right 06/19/2022  . TUBAL LIGATION      Family History  Problem Relation Age of Onset  . Diabetes Mother   . Stroke Mother        brother, sister, MGM  . Colon polyps Mother   . Heart disease Father   . Kidney disease Father   . Colon polyps Father   . Lung disease Father   . Kidney cancer Sister   . Stroke Sister   . Diabetes Sister   . Colon polyps Sister   . Heart attack Paternal Grandfather   . Celiac disease Other        niece  . Colon cancer Neg Hx   . Esophageal cancer Neg Hx   . Rectal cancer Neg Hx   . Stomach cancer Neg Hx   . Restless legs syndrome Neg Hx   . Neuropathy Neg Hx     Allergies  Allergen Reactions  . Nitrofurantoin     Makes mouth break out/blisters  . Propoxyphene N-Acetaminophen      REACTION: nausea  . Nexium  [Esomeprazole  Magnesium ] Nausea And Vomiting  . Nortriptyline  Hcl Nausea And Vomiting  . Hydrocodone  Bit-Homatrop Mbr Itching and  Rash  . Morphine  Itching and Rash    Current Outpatient Medications on File Prior to Visit  Medication Sig Dispense Refill  . FLUoxetine  (PROZAC ) 20 MG capsule TAKE 1 CAPSULE BY MOUTH EVERY DAY 90 capsule 1  . furosemide  (LASIX ) 20 MG tablet TAKE 1 TABLET BY MOUTH EVERY DAY 90 tablet 1  . HYDROcodone -acetaminophen  (NORCO/VICODIN) 5-325 MG tablet Take 1-2 tablets by mouth every 6 (six) hours as needed for severe pain (pain score 7-10).  42 tablet 0  . latanoprost  (XALATAN ) 0.005 % ophthalmic solution Place 1 drop into both eyes at bedtime.     . losartan -hydrochlorothiazide  (HYZAAR) 50-12.5 MG tablet TAKE 1 TABLET BY MOUTH EVERY DAY 90 tablet 1  . methocarbamol  (ROBAXIN ) 500 MG tablet Take 1 tablet (500 mg total) by mouth every 6 (six) hours as needed for muscle spasms. 40 tablet 0  . omeprazole  (PRILOSEC) 20 MG capsule TAKE 1 CAPSULE 2 TIMES DAILY 30-60 MINUTES BEFORE BREAKFAST AND DINNER. 180 capsule 1  . potassium chloride  SA (KLOR-CON  M) 20 MEQ tablet TAKE 1 TABLET BY MOUTH EVERY DAY 90 tablet 1  . scopolamine  (TRANSDERM-SCOP) 1 MG/3DAYS Place 1 patch (1.5 mg total) onto the skin every 3 (three) days. 4 patch 0  . tiZANidine  (ZANAFLEX ) 4 MG tablet TAKE 1 TABLET BY MOUTH THREE TIMES A DAY 270 tablet 1  . Vitamin D , Ergocalciferol , (DRISDOL ) 1.25 MG (50000 UNIT) CAPS capsule Take 50,000 Units by mouth once a week.     No current facility-administered medications on file prior to visit.    BP (!) 102/46   Pulse 75   Temp 99.2 F (37.3 C)   Ht 5' 1 (1.549 m)   Wt 195 lb 3.2 oz (88.5 kg)   SpO2 95%   BMI 36.88 kg/m chart      Objective:    BP (!) 102/46   Pulse 75   Temp 99.2 F (37.3 C)   Ht 5' 1 (1.549 m)   Wt 195 lb 3.2 oz (88.5 kg)   SpO2 95%   BMI 36.88 kg/m    Physical Exam Vitals reviewed.  Constitutional:      Appearance: Normal appearance. She is obese.  HENT:     Right Ear: Tympanic membrane, ear canal and external ear normal.     Left Ear: Tympanic  membrane, ear canal and external ear normal.     Mouth/Throat:     Mouth: Mucous membranes are moist.     Pharynx: Oropharynx is clear.  Cardiovascular:     Rate and Rhythm: Normal rate and regular rhythm.  Pulmonary:     Effort: Pulmonary effort is normal.     Breath sounds: Normal breath sounds.  Musculoskeletal:        General: Normal range of motion.     Cervical back: Normal range of motion and neck supple.  Skin:    General: Skin is warm and dry.  Neurological:     General: No focal deficit present.     Mental Status: She is alert and oriented to person, place, and time. Mental status is at baseline.  Psychiatric:        Mood and Affect: Mood normal.        Behavior: Behavior normal.        Thought Content: Thought content normal.    Results for orders placed or performed in visit on 01/15/24  POC COVID-19  Result Value Ref Range   SARS Coronavirus 2 Ag Negative Negative  POCT Influenza A/B  Result Value Ref Range   Influenza A, POC Negative Negative   Influenza B, POC Negative Negative        Assessment & Plan:   Problem List Items Addressed This Visit   None Visit Diagnoses       Fever, unspecified fever cause    -  Primary   Relevant Orders   POC COVID-19 (Completed)   POCT Influenza A/B (Completed)   Urine Culture  Chills with fever       Relevant Orders   POCT Influenza A/B (Completed)   Urine Culture     Urinary frequency       Relevant Orders   POCT Urinalysis Dipstick   Urine Culture     Bacterial urinary infection       Relevant Medications   sulfamethoxazole-trimethoprim (BACTRIM DS) 800-160 MG tablet       Meds ordered this encounter  Medications  . sulfamethoxazole-trimethoprim (BACTRIM DS) 800-160 MG tablet    Sig: Take 1 tablet by mouth 2 (two) times daily.    Dispense:  14 tablet    Refill:  0  . ondansetron  (ZOFRAN ) 4 MG tablet    Sig: Take 1 tablet (4 mg total) by mouth every 6 (six) hours as needed for nausea.     Dispense:  20 tablet    Refill:  0   Urine culture sent.  Will treat patient with Bactrim DS twice a day for 7 days.  Zofran  prescribed for nausea.  Drink plenty of fluids.  Rest.  Call the office if symptoms worsen or persist. No follow-ups on file.  Ariel Dimitri B Tammatha Cobb, FNP

## 2024-01-15 NOTE — Telephone Encounter (Signed)
 FYI Only or Action Required?: FYI only for provider: ED advised.  Patient was last seen in primary care on 11/01/2023 by Mahlon Comer [name], MD.  Called Nurse Triage reporting Chills, Generalized Body Aches, and Vomiting.  Symptoms began several days ago.  Interventions attempted: OTC medications: ASA.  Symptoms are: gradually worsening.  Triage Disposition: Go to ED Now (or PCP Triage)  Patient/caregiver understands and will follow disposition?: No, refuses disposition         Copied from CRM [id number]. Topic: Clinical - Red Word Triage >> Jan 15, 2024  8:30 AM Andrea Byrd wrote: Red Word that prompted transfer to Nurse Triage: chills, vomiting, body pain all over since Sunday. Reason for Disposition  Patient sounds very sick or weak to the triager    Pt reports living alone and having to hold on to objects to walk.  Pt does not have nay family or friends nearby that can take her to the ED. Triager offered/advised calling EMS, but pt declined.   Triager called PCP [facility] and spoke to [employer] re: disposition refusal.  Answer Assessment - Initial Assessment Questions 1. VOMITING SEVERITY: How many times have you vomited in the past 24 hours?      5-6 x 2. ONSET: When did the vomiting begin?      Sunday 3. FLUIDS: What fluids or food have you vomited up today? Have you been able to keep any fluids down?     Still able to keep fluids down 4. ABDOMEN PAIN: Are your having any abdomen pain? If Yes : How bad is it and what does it feel like? (e.g., crampy, dull, intermittent, constant)      denies 5. DIARRHEA: Is there any diarrhea? If Yes, ask: How many times today?      denies 6. CONTACTS: Is there anyone else in the family with the same symptoms?      denies 7. CAUSE: What do you think is causing your vomiting?     I dont have a clue 8. HYDRATION STATUS: Any signs of dehydration? (e.g., dry mouth [not only dry lips], too weak to stand) When did you last  urinate?     Dry lips. Reports UOP multiple times last night 9. OTHER SYMPTOMS: Do you have any other symptoms? (e.g., fever, headache, vertigo, vomiting blood or coffee grounds, recent head injury)     Body aches, chills. Tmax 101 @ 0700 - took ASA. Weakness - having to hold onto objects to walk. 10. PREGNANCY: Is there any chance you are pregnant? When was your last menstrual period?       N/a  Protocols used: Vomiting-A-AH

## 2024-01-17 ENCOUNTER — Emergency Department (HOSPITAL_BASED_OUTPATIENT_CLINIC_OR_DEPARTMENT_OTHER)

## 2024-01-17 ENCOUNTER — Inpatient Hospital Stay (HOSPITAL_BASED_OUTPATIENT_CLINIC_OR_DEPARTMENT_OTHER): Admission: EM | Admit: 2024-01-17 | Discharge: 2024-01-20 | DRG: 690 | Disposition: A | Discharge status: Home Health

## 2024-01-17 ENCOUNTER — Other Ambulatory Visit: Payer: Self-pay

## 2024-01-17 ENCOUNTER — Ambulatory Visit: Payer: Self-pay

## 2024-01-17 ENCOUNTER — Emergency Department (HOSPITAL_BASED_OUTPATIENT_CLINIC_OR_DEPARTMENT_OTHER): Admitting: Radiology

## 2024-01-17 DIAGNOSIS — N10 Acute tubulo-interstitial nephritis: Principal | ICD-10-CM | POA: Diagnosis present

## 2024-01-17 DIAGNOSIS — R918 Other nonspecific abnormal finding of lung field: Secondary | ICD-10-CM | POA: Diagnosis not present

## 2024-01-17 DIAGNOSIS — K219 Gastro-esophageal reflux disease without esophagitis: Secondary | ICD-10-CM | POA: Diagnosis present

## 2024-01-17 DIAGNOSIS — Z79899 Other long term (current) drug therapy: Secondary | ICD-10-CM

## 2024-01-17 DIAGNOSIS — F39 Unspecified mood [affective] disorder: Secondary | ICD-10-CM | POA: Diagnosis present

## 2024-01-17 DIAGNOSIS — Z83719 Family history of colon polyps, unspecified: Secondary | ICD-10-CM

## 2024-01-17 DIAGNOSIS — Z8379 Family history of other diseases of the digestive system: Secondary | ICD-10-CM

## 2024-01-17 DIAGNOSIS — Z791 Long term (current) use of non-steroidal anti-inflammatories (NSAID): Secondary | ICD-10-CM | POA: Diagnosis not present

## 2024-01-17 DIAGNOSIS — I700 Atherosclerosis of aorta: Secondary | ICD-10-CM | POA: Diagnosis not present

## 2024-01-17 DIAGNOSIS — Z6834 Body mass index (BMI) 34.0-34.9, adult: Secondary | ICD-10-CM

## 2024-01-17 DIAGNOSIS — Z860100 Personal history of colon polyps, unspecified: Secondary | ICD-10-CM

## 2024-01-17 DIAGNOSIS — N179 Acute kidney failure, unspecified: Secondary | ICD-10-CM

## 2024-01-17 DIAGNOSIS — Z833 Family history of diabetes mellitus: Secondary | ICD-10-CM

## 2024-01-17 DIAGNOSIS — Z885 Allergy status to narcotic agent status: Secondary | ICD-10-CM

## 2024-01-17 DIAGNOSIS — Z96611 Presence of right artificial shoulder joint: Secondary | ICD-10-CM | POA: Diagnosis present

## 2024-01-17 DIAGNOSIS — E876 Hypokalemia: Secondary | ICD-10-CM | POA: Diagnosis present

## 2024-01-17 DIAGNOSIS — I10 Essential (primary) hypertension: Secondary | ICD-10-CM | POA: Diagnosis present

## 2024-01-17 DIAGNOSIS — K5730 Diverticulosis of large intestine without perforation or abscess without bleeding: Secondary | ICD-10-CM | POA: Diagnosis not present

## 2024-01-17 DIAGNOSIS — Z881 Allergy status to other antibiotic agents status: Secondary | ICD-10-CM

## 2024-01-17 DIAGNOSIS — R112 Nausea with vomiting, unspecified: Secondary | ICD-10-CM

## 2024-01-17 DIAGNOSIS — Z823 Family history of stroke: Secondary | ICD-10-CM

## 2024-01-17 DIAGNOSIS — Z1152 Encounter for screening for COVID-19: Secondary | ICD-10-CM | POA: Diagnosis not present

## 2024-01-17 DIAGNOSIS — R42 Dizziness and giddiness: Secondary | ICD-10-CM | POA: Diagnosis not present

## 2024-01-17 DIAGNOSIS — G2581 Restless legs syndrome: Secondary | ICD-10-CM | POA: Diagnosis present

## 2024-01-17 DIAGNOSIS — N12 Tubulo-interstitial nephritis, not specified as acute or chronic: Secondary | ICD-10-CM | POA: Diagnosis not present

## 2024-01-17 DIAGNOSIS — R509 Fever, unspecified: Secondary | ICD-10-CM | POA: Diagnosis not present

## 2024-01-17 DIAGNOSIS — Z9049 Acquired absence of other specified parts of digestive tract: Secondary | ICD-10-CM

## 2024-01-17 DIAGNOSIS — Z8541 Personal history of malignant neoplasm of cervix uteri: Secondary | ICD-10-CM

## 2024-01-17 DIAGNOSIS — Z888 Allergy status to other drugs, medicaments and biological substances status: Secondary | ICD-10-CM

## 2024-01-17 DIAGNOSIS — Z96641 Presence of right artificial hip joint: Secondary | ICD-10-CM | POA: Diagnosis present

## 2024-01-17 DIAGNOSIS — Z8249 Family history of ischemic heart disease and other diseases of the circulatory system: Secondary | ICD-10-CM

## 2024-01-17 DIAGNOSIS — Z825 Family history of asthma and other chronic lower respiratory diseases: Secondary | ICD-10-CM

## 2024-01-17 DIAGNOSIS — I517 Cardiomegaly: Secondary | ICD-10-CM | POA: Diagnosis not present

## 2024-01-17 DIAGNOSIS — K429 Umbilical hernia without obstruction or gangrene: Secondary | ICD-10-CM | POA: Diagnosis not present

## 2024-01-17 DIAGNOSIS — D638 Anemia in other chronic diseases classified elsewhere: Secondary | ICD-10-CM | POA: Diagnosis present

## 2024-01-17 DIAGNOSIS — Z8051 Family history of malignant neoplasm of kidney: Secondary | ICD-10-CM

## 2024-01-17 DIAGNOSIS — R9082 White matter disease, unspecified: Secondary | ICD-10-CM | POA: Diagnosis not present

## 2024-01-17 DIAGNOSIS — Z8419 Family history of other disorders of kidney and ureter: Secondary | ICD-10-CM

## 2024-01-17 DIAGNOSIS — E66811 Obesity, class 1: Secondary | ICD-10-CM | POA: Diagnosis present

## 2024-01-17 DIAGNOSIS — H409 Unspecified glaucoma: Secondary | ICD-10-CM | POA: Diagnosis present

## 2024-01-17 DIAGNOSIS — E669 Obesity, unspecified: Secondary | ICD-10-CM | POA: Diagnosis present

## 2024-01-17 LAB — LIPASE, BLOOD: Lipase: 21 U/L (ref 11–51)

## 2024-01-17 LAB — URINALYSIS, ROUTINE W REFLEX MICROSCOPIC
Bacteria, UA: NONE SEEN
Bilirubin Urine: NEGATIVE
Glucose, UA: NEGATIVE mg/dL
Hgb urine dipstick: NEGATIVE
Ketones, ur: NEGATIVE mg/dL
Nitrite: NEGATIVE
Specific Gravity, Urine: 1.019 (ref 1.005–1.030)
pH: 6 (ref 5.0–8.0)

## 2024-01-17 LAB — RESP PANEL BY RT-PCR (RSV, FLU A&B, COVID)  RVPGX2
Influenza A by PCR: NEGATIVE
Influenza B by PCR: NEGATIVE
Resp Syncytial Virus by PCR: NEGATIVE
SARS Coronavirus 2 by RT PCR: NEGATIVE

## 2024-01-17 LAB — CBC WITH DIFFERENTIAL/PLATELET
Abs Immature Granulocytes: 0.02 K/uL (ref 0.00–0.07)
Basophils Absolute: 0 K/uL (ref 0.0–0.1)
Basophils Relative: 1 %
Eosinophils Absolute: 0 K/uL (ref 0.0–0.5)
Eosinophils Relative: 1 %
HCT: 34.2 % — ABNORMAL LOW (ref 36.0–46.0)
Hemoglobin: 11.2 g/dL — ABNORMAL LOW (ref 12.0–15.0)
Immature Granulocytes: 0 %
Lymphocytes Relative: 18 %
Lymphs Abs: 0.9 K/uL (ref 0.7–4.0)
MCH: 28.6 pg (ref 26.0–34.0)
MCHC: 32.7 g/dL (ref 30.0–36.0)
MCV: 87.5 fL (ref 80.0–100.0)
Monocytes Absolute: 0.7 K/uL (ref 0.1–1.0)
Monocytes Relative: 13 %
Neutro Abs: 3.4 K/uL (ref 1.7–7.7)
Neutrophils Relative %: 67 %
Platelets: 257 K/uL (ref 150–400)
RBC: 3.91 MIL/uL (ref 3.87–5.11)
RDW: 14.6 % (ref 11.5–15.5)
WBC: 5 K/uL (ref 4.0–10.5)
nRBC: 0 % (ref 0.0–0.2)

## 2024-01-17 LAB — URINE CULTURE
MICRO NUMBER:: 17219950
SPECIMEN QUALITY:: ADEQUATE

## 2024-01-17 LAB — COMPREHENSIVE METABOLIC PANEL WITH GFR
ALT: 20 U/L (ref 0–44)
AST: 29 U/L (ref 15–41)
Albumin: 3.8 g/dL (ref 3.5–5.0)
Alkaline Phosphatase: 133 U/L — ABNORMAL HIGH (ref 38–126)
Anion gap: 11 (ref 5–15)
BUN: 17 mg/dL (ref 8–23)
CO2: 29 mmol/L (ref 22–32)
Calcium: 9 mg/dL (ref 8.9–10.3)
Chloride: 100 mmol/L (ref 98–111)
Creatinine, Ser: 1.05 mg/dL — ABNORMAL HIGH (ref 0.44–1.00)
GFR, Estimated: 54 mL/min — ABNORMAL LOW (ref >60)
Glucose, Bld: 108 mg/dL — ABNORMAL HIGH (ref 70–99)
Potassium: 3.1 mmol/L — ABNORMAL LOW (ref 3.5–5.1)
Sodium: 139 mmol/L (ref 135–145)
Total Bilirubin: 0.3 mg/dL (ref 0.0–1.2)
Total Protein: 6.5 g/dL (ref 6.5–8.1)

## 2024-01-17 LAB — LACTIC ACID, PLASMA: Lactic Acid, Venous: 1.2 mmol/L (ref 0.5–1.9)

## 2024-01-17 MED ORDER — SODIUM CHLORIDE 0.9 % IV SOLN: 1.0000 g | Freq: Two times a day (BID) | INTRAVENOUS | Status: DC

## 2024-01-17 MED ORDER — FENTANYL CITRATE (PF) 50 MCG/ML IJ SOSY
50.0000 ug | PREFILLED_SYRINGE | Freq: Four times a day (QID) | INTRAMUSCULAR | Status: AC | PRN
  Administered 2024-01-18 (×3): 50 ug via INTRAVENOUS
  Filled 2024-01-17 (×3): qty 1

## 2024-01-17 MED ORDER — SODIUM CHLORIDE 0.9 % IV SOLN
12.5000 mg | Freq: Four times a day (QID) | INTRAVENOUS | Status: DC | PRN
  Administered 2024-01-17 – 2024-01-19 (×2): 12.5 mg via INTRAVENOUS
  Filled 2024-01-17: qty 12.5
  Filled 2024-01-17: qty 0.5

## 2024-01-17 MED ORDER — PROMETHAZINE HCL 25 MG/ML IJ SOLN
INTRAMUSCULAR | Status: AC
Start: 2024-01-17 — End: 2024-01-17
  Administered 2024-01-17: 25 mg
  Filled 2024-01-17: qty 1

## 2024-01-17 MED ORDER — ONDANSETRON HCL 4 MG/2ML IJ SOLN
4.0000 mg | Freq: Once | INTRAMUSCULAR | Status: AC
  Administered 2024-01-17: 4 mg via INTRAVENOUS
  Filled 2024-01-17: qty 2

## 2024-01-17 MED ORDER — FENTANYL CITRATE (PF) 50 MCG/ML IJ SOSY
50.0000 ug | PREFILLED_SYRINGE | Freq: Once | INTRAMUSCULAR | Status: AC
  Administered 2024-01-17: 50 ug via INTRAVENOUS
  Filled 2024-01-17: qty 1

## 2024-01-17 MED ORDER — IOHEXOL 300 MG/ML  SOLN
100.0000 mL | Freq: Once | INTRAMUSCULAR | Status: AC | PRN
  Administered 2024-01-17: 100 mL via INTRAVENOUS

## 2024-01-17 MED ORDER — POTASSIUM CHLORIDE 10 MEQ/100ML IV SOLN
10.0000 meq | INTRAVENOUS | Status: AC
  Administered 2024-01-17 (×2): 10 meq via INTRAVENOUS
  Filled 2024-01-17: qty 100

## 2024-01-17 MED ORDER — FUROSEMIDE 20 MG PO TABS
20.0000 mg | ORAL_TABLET | Freq: Every day | ORAL | Status: DC
Start: 2024-01-18 — End: 2024-01-20
  Administered 2024-01-18 – 2024-01-19 (×2): 20 mg via ORAL
  Filled 2024-01-17 (×3): qty 1

## 2024-01-17 MED ORDER — HYDROCHLOROTHIAZIDE 12.5 MG PO TABS
12.5000 mg | ORAL_TABLET | Freq: Every day | ORAL | Status: DC
  Filled 2024-01-17: qty 1

## 2024-01-17 MED ORDER — LOSARTAN POTASSIUM 25 MG PO TABS
50.0000 mg | ORAL_TABLET | Freq: Every day | ORAL | Status: DC
  Administered 2024-01-18: 50 mg via ORAL
  Filled 2024-01-17: qty 2

## 2024-01-17 MED ORDER — SODIUM CHLORIDE 0.9 % IV SOLN
1.0000 g | INTRAVENOUS | Status: DC
  Administered 2024-01-18 – 2024-01-19 (×2): 1 g via INTRAVENOUS
  Filled 2024-01-17 (×3): qty 10

## 2024-01-17 MED ORDER — LOSARTAN POTASSIUM-HCTZ 50-12.5 MG PO TABS: 1.0000 | ORAL_TABLET | Freq: Every day | ORAL | Status: DC

## 2024-01-17 MED ORDER — SODIUM CHLORIDE 0.9 % IV BOLUS
1000.0000 mL | Freq: Once | INTRAVENOUS | Status: AC
  Administered 2024-01-17: 1000 mL via INTRAVENOUS

## 2024-01-17 MED ORDER — SODIUM CHLORIDE 0.9 % IV SOLN
1.0000 g | Freq: Once | INTRAVENOUS | Status: AC
  Administered 2024-01-17: 1 g via INTRAVENOUS
  Filled 2024-01-17: qty 10

## 2024-01-17 NOTE — ED Triage Notes (Addendum)
 Patient reports nausea and vomiting since Sunday states saw her PCP and was diagnosed with UTI. Has been taking antibiotics without relief. Urine culture positive for E.coli

## 2024-01-17 NOTE — ED Notes (Addendum)
 Pt placed on 2L of oxygen via [place] due to receiving IV pain medication and decreased O2 sat in low 90s.

## 2024-01-17 NOTE — ED Notes (Signed)
 Patient transported to CT

## 2024-01-17 NOTE — Progress Notes (Signed)
 Plan of Care Note for accepted transfer   Patient: Andrea Byrd MRN: [id number]   DOA: 01/17/2024  Facility requesting transfer: [facility]   Requesting Provider: Rosebud Fass, PA   Reason for transfer: Pyelonephritis   Facility course: 78 yr old female with HTN and mood disorder who presents with urinary sxs, N/V, and fevers since 11/9.   She was seen on 11/11, diagnosed with UTI, and prescribed Bactrim. Urine culture from that visit is growing 50-99k cfu/mL E coli.   She is afebrile in ED with stable vitals, normal WBC, normal lactate, and CT findings concerning for pyelonephritis on the right.   Plan of care: The patient is accepted for admission to Telemetry unit, at [facility].   Author: Evalene GORMAN Sprinkles, MD 01/17/2024  Check [email or web address] for on-call coverage.  Nursing staff, Please call [facility] Admits & Consults System-Wide number on Amion as soon as patient's arrival, so appropriate admitting provider can evaluate the pt.

## 2024-01-17 NOTE — ED Provider Notes (Signed)
 [facility] Provider Note   CSN: [id number] Arrival date & time: 01/17/24  1556    Patient presents with: Emesis and Urinary Frequency   Andrea Byrd is a 78 y.o. female here for evaluation of emesis and persistent fevers.  On Sunday developed fever and persistent nausea and vomiting. Was seen on 11/11 by PCP.  Had associated urinary frequency.  No headache, numbness, unilateral weakness, chest pain, shortness of breath, cough.  No hematuria. Has right flank pain.  No sick contacts.  Feels generally weak and lightheaded.  Was given Bactrim for UTI with culture sent as well as Zofran .  She still has persistent fevers at home up to 101 as well as nausea and vomiting.  States occasionally she will be able to keep down her antibiotics however otherwise Zofran  has not helped.  No diarrhea, bloody stool   HPI     Prior to Admission medications   Medication Sig Start Date End Date Taking? Authorizing Provider  FLUoxetine  (PROZAC ) 20 MG capsule TAKE 1 CAPSULE BY MOUTH EVERY DAY 11/26/23   Tabori, Katherine E, MD  furosemide  (LASIX ) 20 MG tablet TAKE 1 TABLET BY MOUTH EVERY DAY 08/20/23   Tabori, Katherine E, MD  HYDROcodone -acetaminophen  (NORCO/VICODIN) 5-325 MG tablet Take 1-2 tablets by mouth every 6 (six) hours as needed for severe pain (pain score 7-10). 07/05/23   Kristian Stabs, PA  latanoprost  (XALATAN ) 0.005 % ophthalmic solution Place 1 drop into both eyes at bedtime.  10/29/13   [provider]  losartan -hydrochlorothiazide  (HYZAAR) 50-12.5 MG tablet TAKE 1 TABLET BY MOUTH EVERY DAY 12/07/23   Tabori, Katherine E, MD  methocarbamol  (ROBAXIN ) 500 MG tablet Take 1 tablet (500 mg total) by mouth every 6 (six) hours as needed for muscle spasms. 07/05/23   Kristian Stabs, PA  omeprazole  (PRILOSEC) 20 MG capsule TAKE 1 CAPSULE 2 TIMES DAILY 30-60 MINUTES BEFORE BREAKFAST AND DINNER. 11/27/23   Beather Delon Gibson, PA  ondansetron  (ZOFRAN ) 4  MG tablet Take 1 tablet (4 mg total) by mouth every 6 (six) hours as needed for nausea. 01/15/24   Webb, Padonda B, FNP  potassium chloride  SA (KLOR-CON  M) 20 MEQ tablet TAKE 1 TABLET BY MOUTH EVERY DAY 11/26/23   Tabori, Katherine E, MD  scopolamine  (TRANSDERM-SCOP) 1 MG/3DAYS Place 1 patch (1.5 mg total) onto the skin every 3 (three) days. 07/12/23   Tabori, Katherine E, MD  sulfamethoxazole-trimethoprim (BACTRIM DS) 800-160 MG tablet Take 1 tablet by mouth 2 (two) times daily. 01/15/24   Webb, Padonda B, FNP  tiZANidine  (ZANAFLEX ) 4 MG tablet TAKE 1 TABLET BY MOUTH THREE TIMES A DAY 12/10/23   Tabori, Katherine E, MD  Vitamin D , Ergocalciferol , (DRISDOL ) 1.25 MG (50000 UNIT) CAPS capsule Take 50,000 Units by mouth once a week. 01/31/23   [provider]    Allergies: Nitrofurantoin, Propoxyphene n-acetaminophen , Nortriptyline  hcl, Hydrocodone  bit-homatrop mbr, and Morphine     Review of Systems  Constitutional:  Positive for activity change, appetite change, chills, fatigue and fever.  HENT: Negative.    Respiratory: Negative.    Cardiovascular: Negative.   Gastrointestinal:  Positive for nausea and vomiting. Negative for abdominal pain, anal bleeding, blood in stool, constipation, diarrhea and rectal pain.  Genitourinary:  Positive for dysuria, frequency and urgency. Negative for decreased urine volume, difficulty urinating, flank pain, hematuria and pelvic pain.  Musculoskeletal:  Positive for myalgias.  Skin: Negative.   Neurological:  Positive for weakness (generalized) and light-headedness. Negative for dizziness, tremors,  seizures, syncope, facial asymmetry, speech difficulty, numbness and headaches.  All other systems reviewed and are negative.   Updated Vital Signs BP 119/74   Pulse 68   Temp 99.6 F (37.6 C) (Rectal)   Resp 14   SpO2 94%   Physical Exam Vitals and nursing note reviewed.  Constitutional:      General: She is not in acute distress.    Appearance: She  is well-developed. She is not ill-appearing, toxic-appearing or diaphoretic.  HENT:     Head: Normocephalic and atraumatic.     Comments: Left eye ptosis, decreased blink to left eye    Nose: Nose normal.     Mouth/Throat:     Mouth: Mucous membranes are dry.  Eyes:     Pupils: Pupils are equal, round, and reactive to light.  Cardiovascular:     Rate and Rhythm: Normal rate.     Pulses: Normal pulses.     Heart sounds: Normal heart sounds.  Pulmonary:     Effort: Pulmonary effort is normal. No respiratory distress.     Breath sounds: Normal breath sounds.  Abdominal:     General: Bowel sounds are normal. There is no distension.     Palpations: Abdomen is soft.     Tenderness: There is abdominal tenderness. There is right CVA tenderness. There is no left CVA tenderness, guarding or rebound.     Comments: Soft, tenderness to right flank.  Diffuse lower abdominal tenderness.    Musculoskeletal:        General: Normal range of motion.     Cervical back: Normal range of motion.     Comments: No bony tenderness, compartment soft, full range of motion  Skin:    General: Skin is warm and dry.     Capillary Refill: Capillary refill takes less than 2 seconds.     Comments: No obvious rashes or lesions to exposed skin  Neurological:     General: No focal deficit present.     Mental Status: She is alert. She is disoriented.     Cranial Nerves: No cranial nerve deficit.     Sensory: No sensory deficit.     Motor: No weakness.     Gait: Gait normal.  Psychiatric:        Mood and Affect: Mood normal.     (all labs ordered are listed, but only abnormal results are displayed) Labs Reviewed  CBC WITH DIFFERENTIAL/PLATELET - Abnormal; Notable for the following components:      Result Value   Hemoglobin 11.2 (*)    HCT 34.2 (*)    All other components within normal limits  COMPREHENSIVE METABOLIC PANEL WITH GFR - Abnormal; Notable for the following components:   Potassium 3.1 (*)     Glucose, Bld 108 (*)    Creatinine, Ser 1.05 (*)    Alkaline Phosphatase 133 (*)    GFR, Estimated 54 (*)    All other components within normal limits  URINALYSIS, ROUTINE W REFLEX MICROSCOPIC - Abnormal; Notable for the following components:   Protein, ur TRACE (*)    Leukocytes,Ua SMALL (*)    All other components within normal limits  RESP PANEL BY RT-PCR (RSV, FLU A&B, COVID)  RVPGX2  CULTURE, BLOOD (ROUTINE X 2)  CULTURE, BLOOD (ROUTINE X 2)  URINE CULTURE  LIPASE, BLOOD  LACTIC ACID, PLASMA  LACTIC ACID, PLASMA    EKG: None  Radiology: CT CHEST ABDOMEN PELVIS W CONTRAST Result Date: 01/17/2024 CLINICAL DATA:  Fever nausea  vomiting EXAM: CT CHEST, ABDOMEN, AND PELVIS WITH CONTRAST TECHNIQUE: Multidetector CT imaging of the chest, abdomen and pelvis was performed following the standard protocol during bolus administration of intravenous contrast. RADIATION DOSE REDUCTION: This exam was performed according to the departmental dose-optimization program which includes automated exposure control, adjustment of the mA and/or kV according to patient size and/or use of iterative reconstruction technique. CONTRAST:  OMNIPAQUE  IOHEXOL  300 MG/ML  SOLN COMPARISON:  Chest x-ray 01/17/2024, CT 09/01/2019 FINDINGS: CT CHEST FINDINGS Cardiovascular: Moderate aortic atherosclerosis. No aneurysm. Mild coronary vascular calcification. Mitral calcification. Normal cardiac size. No pericardial effusion Mediastinum/Nodes: Patent trachea. No thyroid  mass. No suspicious lymph nodes. Small moderate hiatal hernia Lungs/Pleura: No acute airspace disease, pleural effusion or pneumothorax Musculoskeletal: No acute or suspicious osseous abnormality. CT ABDOMEN PELVIS FINDINGS Hepatobiliary: No focal liver abnormality is seen. Status post cholecystectomy. No biliary dilatation. Pancreas: Unremarkable. No pancreatic ductal dilatation or surrounding inflammatory changes. Spleen: Normal in size without focal  abnormality. Adrenals/Urinary Tract: Adrenal glands are normal. No hydronephrosis. Patchy cortical hypoenhancement of the right kidney. Mild urothelial enhancement of the right renal pelvis. No obstructing stone. The bladder is normal Stomach/Bowel: The stomach is nonenlarged. There is no dilated small bowel. No acute bowel wall thickening. Diverticular disease of the colon Vascular/Lymphatic: Aortic atherosclerosis. No enlarged abdominal or pelvic lymph nodes. Reproductive: Status post hysterectomy. No adnexal masses. Other: Negative for ascites or free air. Small fat containing umbilical hernia Musculoskeletal: Grade 1 anterolisthesis L3 on L4. Advanced degenerative changes L4-L5 and L5-S1. No acute osseous abnormality IMPRESSION: 1. Patchy cortical hypoenhancement of the right kidney with mild urothelial enhancement of the right renal pelvis, findings are suspicious for pyelonephritis. No hydronephrosis or obstructing stones. 2. No CT evidence for acute intrathoracic abnormality. No evidence for lung mass or airspace disease. Opacity on chest x-ray is felt secondary to normal vascular structures. 3. Diverticular disease of the colon without acute inflammatory process. 4. Aortic atherosclerosis. Aortic Atherosclerosis (ICD10-I70.0). Electronically Signed   By: Luke Bun M.D.   On: 01/17/2024 19:14   CT Head Wo Contrast Result Date: 01/17/2024 CLINICAL DATA:  Vertigo dizziness EXAM: CT HEAD WITHOUT CONTRAST TECHNIQUE: Contiguous axial images were obtained from the base of the skull through the vertex without intravenous contrast. RADIATION DOSE REDUCTION: This exam was performed according to the departmental dose-optimization program which includes automated exposure control, adjustment of the mA and/or kV according to patient size and/or use of iterative reconstruction technique. COMPARISON:  None Available. FINDINGS: Brain: No acute territorial infarction, hemorrhage or intracranial mass. Patchy white  matter hypodensity consistent with chronic small vessel disease. Normal ventricle size. Small chronic appearing lacunar infarct in the region of the left caudate. Vascular: No hyperdense vessels.  Carotid vascular calcification Skull: Normal. Negative for fracture or focal lesion. Sinuses/Orbits: Moderate mucosal disease in the right maxillary sinus Other: .  None IMPRESSION: 1. No CT evidence for acute intracranial abnormality. 2. Mild chronic small vessel disease of the white matter. Electronically Signed   By: Luke Bun M.D.   On: 01/17/2024 19:05   DG Chest 2 View Result Date: 01/17/2024 CLINICAL DATA:  Fever EXAM: CHEST - 2 VIEW COMPARISON:  Report 08/16/2006 FINDINGS: Right shoulder replacement. Streaky left lung base opacity, favor atelectasis. Borderline cardiomegaly. No pleural effusion or pneumothorax. Aortic atherosclerosis. Vague right apical lung opacity. IMPRESSION: 1. Streaky left lung base opacity, favor atelectasis. 2. Vague right apical lung opacity, recommend further assessment with chest CT, preferably with contrast. Electronically Signed   By: Luke  Scott M.D.   On: 01/17/2024 17:46     Procedures   Medications Ordered in the ED  potassium chloride  10 mEq in 100 mL IVPB (10 mEq Intravenous New Bag/Given 01/17/24 2000)  sodium chloride  0.9 % bolus 1,000 mL (0 mLs Intravenous Stopped 01/17/24 1917)  ondansetron  (ZOFRAN ) injection 4 mg (4 mg Intravenous Given 01/17/24 1637)  fentaNYL  (SUBLIMAZE ) injection 50 mcg (50 mcg Intravenous Given 01/17/24 1637)  cefTRIAXone (ROCEPHIN) 1 g in sodium chloride  0.9 % 100 mL IVPB (0 g Intravenous Stopped 01/17/24 1917)  iohexol  (OMNIPAQUE ) 300 MG/ML solution 100 mL (100 mLs Intravenous Contrast Given 01/17/24 1843)  ondansetron  (ZOFRAN ) injection 4 mg (4 mg Intravenous Given 01/17/24 1957)  fentaNYL  (SUBLIMAZE ) injection 50 mcg (50 mcg Intravenous Given 01/17/24 1957)   78 here for evaluation of feeling unwell.  Fevers over the last 5  days as well as nausea, vomiting and urinary symptoms.  Seen by PCP started on Bactrim.  She continues to have emesis despite Zofran .  Here she appears dehydrated.  She is a nonfocal neuroexam.  Does have some dry mucous membranes.  Her heart and lungs are clear.  Her abdomen is diffusely tender to her lower abdomen.  Patient does have left eye ptosis, decreased blink to left eye, states history of Bell's palsy.  Will plan on labs, imaging, reassess  Labs and imaging personally viewed and interpreted:  CBC without leukocytosis, hemoglobin 11.2 CMP creatinine 1.05-mild AKI, alk phos 133, potassium 3.1 Lipase 21 Lactic 1.2 UA small leuks, 11-20 WBC, culture sent COVID COVID, flu, RSV negative Chest xray abnormality right lower lung base recommend CT scan chest with contrast EKG wo ischemic changes  Patient reassessed. Urine culture from PCP noted with E. coli, sensitive to the Bactrim she was on previously.  Will give Rocephin here IV and supplement her potassium.  Some nausea and pain.  Patient reassessed.  Discussed CT scan showing pyelonephritis.  Given her persistent fevers, intractable nausea will admit to medicine.  CONSULT with Dr. Charlton with medicine-agreeable to accept patient in transfer for admission.  The patient appears reasonably stabilized for admission considering the current resources, flow, and capabilities available in the ED at this time, and I doubt any other [facility] requiring further screening and/or treatment in the ED prior to admission.    Clinical Course as of 01/17/24 2019  Thu Jan 17, 2024  2010 Dr. Charlton with medicine for admission [BH]    Clinical Course User Index [BH] Leeann Bady A, PA-C                                 Medical Decision Making Amount and/or Complexity of Data Reviewed External Data Reviewed: labs, radiology, ECG and notes.    Details: PCP visit, urine culture Labs: ordered. Decision-making details documented in ED Course. Radiology:  ordered and independent interpretation performed. Decision-making details documented in ED Course. ECG/medicine tests: ordered and independent interpretation performed. Decision-making details documented in ED Course.  Risk OTC drugs. Prescription drug management. Parenteral controlled substances. Decision regarding hospitalization. Diagnosis or treatment significantly limited by social determinants of health.       Final diagnoses:  Pyelonephritis  Intractable nausea and vomiting  AKI (acute kidney injury)  Hypokalemia    ED Discharge Orders     None          Sha Amer A, PA-C 01/17/24 2019    Neysa Caron PARAS, DO 01/17/24 2308

## 2024-01-17 NOTE — ED Notes (Signed)
 Blood cultures drawn x2 before starting antibiotics.

## 2024-01-17 NOTE — Telephone Encounter (Signed)
 Patient called to state that she felt much worse and has not been able to keep any fluids down at all She states she is still having a low grade fever Vomiting multiple times today---more than 3-4 Patient was advised initially on 01/15/2024 to go to the ER but didn't go at that time, she had an appointment in the office on 01/16/2024 but patient states she is worse and cannot hold any liquids down. Patient sounds very unwell over the phone during triage.  She states that she is going to go to the ER at this time and she is advised that it is recommended that someone drives her.  She states that she will find someone to drive her and she is advised that 911 is there for her if she needs to call them to be transported to the ER Patient verbalized understanding.   FYI Only or Action Required?: FYI only for provider: ED advised.  Patient was last seen in primary care on 01/15/2024 by Douglass Kenney NOVAK, FNP.  Called Nurse Triage reporting Vomiting.  Symptoms began several days ago.  Interventions attempted: Prescription medications: antibiotic and Rest, hydration, or home remedies.  Symptoms are: worsening.  Triage Disposition: Go to ED Now (or PCP Triage)  Patient/caregiver understands and will follow disposition?: Yes                  Copied from CRM #[id number]. Topic: Clinical - Red Word Triage >> Jan 17, 2024  2:52 PM Alfonso HERO wrote: Red Word that prompted transfer to Nurse Triage: patient not keeping fluids down not feeling better Reason for Disposition  Patient sounds very sick or weak to the triager  Answer Assessment - Initial Assessment Questions Patient called to state that she felt much worse and has not been able to keep any fluids down at all She states she is still having a low grade fever Vomiting multiple times today---more than 3-4 Patient was advised initially on 01/15/2024 to go to the ER but didn't go at that time, she had an appointment in the office on  01/16/2024 but patient states she is worse and cannot hold any liquids down. Patient sounds very unwell over the phone during triage.  She states that she is going to go to the ER at this time and she is advised that it is recommended that someone drives her.  She states that she will find someone to drive her and she is advised that 911 is there for her if she needs to call them to be transported to the ER Patient verbalized understanding.  Protocols used: Vomiting-A-AH

## 2024-01-18 ENCOUNTER — Encounter (HOSPITAL_COMMUNITY): Payer: Self-pay | Admitting: Family Medicine

## 2024-01-18 DIAGNOSIS — Z881 Allergy status to other antibiotic agents status: Secondary | ICD-10-CM | POA: Diagnosis not present

## 2024-01-18 DIAGNOSIS — G2581 Restless legs syndrome: Secondary | ICD-10-CM | POA: Diagnosis present

## 2024-01-18 DIAGNOSIS — Z96641 Presence of right artificial hip joint: Secondary | ICD-10-CM | POA: Diagnosis present

## 2024-01-18 DIAGNOSIS — Z825 Family history of asthma and other chronic lower respiratory diseases: Secondary | ICD-10-CM | POA: Diagnosis not present

## 2024-01-18 DIAGNOSIS — D638 Anemia in other chronic diseases classified elsewhere: Secondary | ICD-10-CM | POA: Diagnosis present

## 2024-01-18 DIAGNOSIS — F39 Unspecified mood [affective] disorder: Secondary | ICD-10-CM | POA: Diagnosis present

## 2024-01-18 DIAGNOSIS — Z860100 Personal history of colon polyps, unspecified: Secondary | ICD-10-CM | POA: Diagnosis not present

## 2024-01-18 DIAGNOSIS — Z8541 Personal history of malignant neoplasm of cervix uteri: Secondary | ICD-10-CM | POA: Diagnosis not present

## 2024-01-18 DIAGNOSIS — Z6834 Body mass index (BMI) 34.0-34.9, adult: Secondary | ICD-10-CM | POA: Diagnosis not present

## 2024-01-18 DIAGNOSIS — Z96611 Presence of right artificial shoulder joint: Secondary | ICD-10-CM | POA: Diagnosis present

## 2024-01-18 DIAGNOSIS — Z8249 Family history of ischemic heart disease and other diseases of the circulatory system: Secondary | ICD-10-CM | POA: Diagnosis not present

## 2024-01-18 DIAGNOSIS — Z9049 Acquired absence of other specified parts of digestive tract: Secondary | ICD-10-CM | POA: Diagnosis not present

## 2024-01-18 DIAGNOSIS — H409 Unspecified glaucoma: Secondary | ICD-10-CM | POA: Diagnosis present

## 2024-01-18 DIAGNOSIS — N10 Acute tubulo-interstitial nephritis: Secondary | ICD-10-CM | POA: Diagnosis present

## 2024-01-18 DIAGNOSIS — Z791 Long term (current) use of non-steroidal anti-inflammatories (NSAID): Secondary | ICD-10-CM | POA: Diagnosis not present

## 2024-01-18 DIAGNOSIS — Z833 Family history of diabetes mellitus: Secondary | ICD-10-CM | POA: Diagnosis not present

## 2024-01-18 DIAGNOSIS — E876 Hypokalemia: Secondary | ICD-10-CM | POA: Diagnosis present

## 2024-01-18 DIAGNOSIS — Z79899 Other long term (current) drug therapy: Secondary | ICD-10-CM | POA: Diagnosis not present

## 2024-01-18 DIAGNOSIS — I10 Essential (primary) hypertension: Secondary | ICD-10-CM | POA: Diagnosis present

## 2024-01-18 DIAGNOSIS — Z888 Allergy status to other drugs, medicaments and biological substances status: Secondary | ICD-10-CM | POA: Diagnosis not present

## 2024-01-18 DIAGNOSIS — Z1152 Encounter for screening for COVID-19: Secondary | ICD-10-CM | POA: Diagnosis not present

## 2024-01-18 DIAGNOSIS — E66811 Obesity, class 1: Secondary | ICD-10-CM | POA: Diagnosis present

## 2024-01-18 DIAGNOSIS — K219 Gastro-esophageal reflux disease without esophagitis: Secondary | ICD-10-CM | POA: Diagnosis present

## 2024-01-18 DIAGNOSIS — Z885 Allergy status to narcotic agent status: Secondary | ICD-10-CM | POA: Diagnosis not present

## 2024-01-18 LAB — COMPREHENSIVE METABOLIC PANEL WITH GFR
ALT: 22 U/L (ref 0–44)
AST: 28 U/L (ref 15–41)
Albumin: 3.9 g/dL (ref 3.5–5.0)
Alkaline Phosphatase: 126 U/L (ref 38–126)
Anion gap: 11 (ref 5–15)
BUN: 10 mg/dL (ref 8–23)
CO2: 31 mmol/L (ref 22–32)
Calcium: 9.6 mg/dL (ref 8.9–10.3)
Chloride: 100 mmol/L (ref 98–111)
Creatinine, Ser: 0.85 mg/dL (ref 0.44–1.00)
GFR, Estimated: >60 mL/min (ref >60)
Glucose, Bld: 93 mg/dL (ref 70–99)
Potassium: 3.8 mmol/L (ref 3.5–5.1)
Sodium: 142 mmol/L (ref 135–145)
Total Bilirubin: 0.4 mg/dL (ref 0.0–1.2)
Total Protein: 7 g/dL (ref 6.5–8.1)

## 2024-01-18 LAB — CBC
HCT: 37.5 % (ref 36.0–46.0)
Hemoglobin: 11.8 g/dL — ABNORMAL LOW (ref 12.0–15.0)
MCH: 28.3 pg (ref 26.0–34.0)
MCHC: 31.5 g/dL (ref 30.0–36.0)
MCV: 89.9 fL (ref 80.0–100.0)
Platelets: 256 K/uL (ref 150–400)
RBC: 4.17 MIL/uL (ref 3.87–5.11)
RDW: 14.8 % (ref 11.5–15.5)
WBC: 6 K/uL (ref 4.0–10.5)
nRBC: 0 % (ref 0.0–0.2)

## 2024-01-18 LAB — PHOSPHORUS: Phosphorus: 2.8 mg/dL (ref 2.5–4.6)

## 2024-01-18 LAB — URINE CULTURE: Culture: NO GROWTH

## 2024-01-18 LAB — MAGNESIUM: Magnesium: 2.1 mg/dL (ref 1.7–2.4)

## 2024-01-18 MED ORDER — ACETAMINOPHEN 325 MG PO TABS
650.0000 mg | ORAL_TABLET | Freq: Four times a day (QID) | ORAL | Status: DC | PRN
  Administered 2024-01-20: 650 mg via ORAL
  Filled 2024-01-18: qty 2

## 2024-01-18 MED ORDER — LOSARTAN POTASSIUM 50 MG PO TABS
50.0000 mg | ORAL_TABLET | Freq: Every day | ORAL | Status: DC
  Administered 2024-01-19 – 2024-01-20 (×2): 50 mg via ORAL
  Filled 2024-01-18 (×2): qty 1

## 2024-01-18 MED ORDER — OXYCODONE HCL 5 MG PO TABS
5.0000 mg | ORAL_TABLET | Freq: Four times a day (QID) | ORAL | Status: DC | PRN
Start: 2024-01-18 — End: 2024-01-19
  Administered 2024-01-18: 5 mg via ORAL
  Filled 2024-01-18: qty 1

## 2024-01-18 MED ORDER — HYDROCHLOROTHIAZIDE 25 MG PO TABS
12.5000 mg | ORAL_TABLET | Freq: Every day | ORAL | Status: DC
  Administered 2024-01-18 – 2024-01-20 (×3): 12.5 mg via ORAL
  Filled 2024-01-18 (×3): qty 1

## 2024-01-18 MED ORDER — POTASSIUM CHLORIDE CRYS ER 20 MEQ PO TBCR
20.0000 meq | EXTENDED_RELEASE_TABLET | Freq: Every day | ORAL | Status: DC
  Administered 2024-01-18 – 2024-01-20 (×3): 20 meq via ORAL
  Filled 2024-01-18 (×3): qty 1

## 2024-01-18 MED ORDER — KETOROLAC TROMETHAMINE 15 MG/ML IJ SOLN
15.0000 mg | Freq: Four times a day (QID) | INTRAMUSCULAR | Status: DC
  Filled 2024-01-18: qty 1

## 2024-01-18 MED ORDER — LATANOPROST 0.005 % OP SOLN
1.0000 [drp] | Freq: Every day | OPHTHALMIC | Status: DC
  Administered 2024-01-18 – 2024-01-19 (×2): 1 [drp] via OPHTHALMIC
  Filled 2024-01-18: qty 2.5

## 2024-01-18 MED ORDER — FLUOXETINE HCL 20 MG PO CAPS
20.0000 mg | ORAL_CAPSULE | Freq: Every day | ORAL | Status: DC
  Administered 2024-01-18 – 2024-01-20 (×3): 20 mg via ORAL
  Filled 2024-01-18 (×3): qty 1

## 2024-01-18 MED ORDER — BISACODYL 10 MG RE SUPP: 10.0000 mg | Freq: Every day | RECTAL | Status: DC | PRN

## 2024-01-18 MED ORDER — POLYETHYLENE GLYCOL 3350 17 G PO PACK
17.0000 g | PACK | Freq: Every day | ORAL | Status: DC | PRN
  Administered 2024-01-18: 17 g via ORAL
  Filled 2024-01-18: qty 1

## 2024-01-18 MED ORDER — PRAMIPEXOLE DIHYDROCHLORIDE 0.25 MG PO TABS
1.0000 mg | ORAL_TABLET | Freq: Two times a day (BID) | ORAL | Status: DC
  Administered 2024-01-18 – 2024-01-20 (×4): 1 mg via ORAL
  Filled 2024-01-18 (×2): qty 4
  Filled 2024-01-18: qty 1
  Filled 2024-01-18: qty 4

## 2024-01-18 MED ORDER — ACETAMINOPHEN 650 MG RE SUPP: 650.0000 mg | Freq: Four times a day (QID) | RECTAL | Status: DC | PRN

## 2024-01-18 MED ORDER — PANTOPRAZOLE SODIUM 40 MG PO TBEC
40.0000 mg | DELAYED_RELEASE_TABLET | Freq: Every day | ORAL | Status: DC
  Administered 2024-01-18 – 2024-01-20 (×3): 40 mg via ORAL
  Filled 2024-01-18 (×3): qty 1

## 2024-01-18 MED ORDER — MAGNESIUM HYDROXIDE 400 MG/5ML PO SUSP: 30.0000 mL | Freq: Every day | ORAL | Status: DC | PRN

## 2024-01-18 NOTE — ED Notes (Signed)
 Pt ambulatory with steady gait.  Stand by assist from this RN.

## 2024-01-18 NOTE — ED Notes (Signed)
 Pt placed on 2L [place] after pain medication and now sleeping.  Oxygen sat 85% RA and up to 98% on 2L.

## 2024-01-18 NOTE — Telephone Encounter (Signed)
 Pt was admitted for pyelonephritis

## 2024-01-18 NOTE — H&P (Signed)
 History and Physical    Patient: Andrea Byrd FMW:[id number] DOB: Nov 24, 1945 DOA: 01/17/2024 DOS: the patient was seen and examined on 01/18/2024 PCP: Mahlon Comer [name], MD  Patient coming from: Home  Chief Complaint:  Chief Complaint  Patient presents with   Emesis   Urinary Frequency   HPI: Andrea Byrd is a 78 y.o. female with medical history significant of anemia, osteoarthritis, history of cervical cancer, diverticulosis, dry eyes, glaucoma, headache, HSV-1 infection, endometriosis, esophageal stricture, GERD, hiatal hernia, gastritis, hypertension, insomnia, restless leg syndrome, cholelithiasis/cholecystectomy, colon polyps, IBS who presented to the emergency department with complaints of urinary frequency, nausea and emesis.  She was treated with Bactrim for the devious 2 days with improvement of the symptoms. He denied fever, chills, rhinorrhea, sore throat, wheezing or hemoptysis.  No chest pain, palpitations, diaphoresis, PND, orthopnea or pitting edema of the lower extremities.  No abdominal pain, nausea, emesis, diarrhea, constipation, melena or hematochezia. No polyuria, polydipsia, polyphagia or blurred vision.   Lab work: Urinalysis showed trace protein, small leukocyte esterase and no bacteria.  CBC showed a white count of 5.0, hemoglobin 11.2 g/dL platelets 742.  Unremarkable lipase, lactic acid and viral PCR test.  CMP showed potassium of 3.1 mmol/L, alkaline phosphatase 133 units/L, glucose 108 and creatinine 1.05 mg/dL.  The rest of the CMP measurements were normal.  Imaging: 2 view chest radiograph showing a streaky left lung base opacity, favoring atelectasis.  Vague right apical lung opacity, recommend further assessment with CT chest with contrast.  CT head without contrast with no acute intracranial normality.  There is mild chronic small vessel disease of the white matter.  CT chest/abdomen/pelvis with contrast showing patchy cortical hypoenhancement of the right kidney  with mild urothelial enhancement of the right renal pelvis suspicious for pyelonephritis.  No hydronephrosis or obstructive ureterolith.  No CT evidence of acute intrathoracic abnormality with the opacity on chest x-ray is secondary to normal vascular structures.  Diverticulosis without diverticulitis.  Aortic atherosclerosis.   ED course: Initial vital signs were temperature 98.4 F, pulse 85, respiration 24, BP 130/71 mmHg and O2 sat 94% on room air.  The patient received ceftriaxone 1 g IVPB, fentanyl  50 mcg IVP x 4, 1000 mL normal saline bolus, ondansetron  4 mg IVP x 2, KCl 10 mEq IVPB x 2 and 1000 mL of normal saline bolus.  Review of Systems: As mentioned in the history of present illness. All other systems reviewed and are negative. Past Medical History:  Diagnosis Date   Anemia    Arthritis    Cervical cancer (HCC) 1977   Diverticulosis    Dry eyes    Endometriosis    Esophageal stricture    Gastritis    GERD (gastroesophageal reflux disease)    Glaucoma    Headache    Herpes simplex type 1 infection    Hiatal hernia    History of colon polyps    History of gallstones    Hypertension    IBS (irritable bowel syndrome)    Insomnia    Post-operative nausea and vomiting    Restless leg syndrome    Past Surgical History:  Procedure Laterality Date   ABDOMINAL HYSTERECTOMY  1998   APPENDECTOMY  1998   BUNIONECTOMY WITH HAMMERTOE RECONSTRUCTION Right 11/03/2021   Procedure: Right bunion correction with double osteotomy; Second Metatarsal weil; Second hammertoe correction; third through fifth tendon lengthening; excision of bunionette;  Surgeon: Kit Rush, MD;  Location: [facility];  Service: Orthopedics;  Laterality: Right;   CERVICAL CONE BIOPSY     CHOLECYSTECTOMY  1980   COLON RESECTION  2002   COLON SURGERY  2003   removed 2 feet colon   ESOPHAGEAL MANOMETRY N/A 04/24/2016   Procedure: ESOPHAGEAL MANOMETRY (EM);  Surgeon: Gustav Shila GAILS, MD;   Location: [facility];  Service: Endoscopy;  Laterality: N/A;   HAND SURGERY     right/ thumb joints replaced both hands   OOPHORECTOMY     right foot surgery Right 08/15/2022   SHOULDER ARTHROSCOPY  02/28/2022   TOTAL HIP ARTHROPLASTY Right 07/02/2023   Procedure: ARTHROPLASTY, HIP, TOTAL, ANTERIOR APPROACH;  Surgeon: Melodi Lerner, MD;  Location: [facility];  Service: Orthopedics;  Laterality: Right;   TOTAL SHOULDER REPLACEMENT Right 06/19/2022   TUBAL LIGATION     Social History:  reports that she has never smoked. She has never used smokeless tobacco. She reports that she does not drink alcohol and does not use drugs.  Allergies  Allergen Reactions   Nitrofurantoin     Makes mouth break out/blisters   Propoxyphene N-Acetaminophen      REACTION: nausea   Nortriptyline  Hcl Nausea And Vomiting   Hydrocodone  Bit-Homatrop Mbr Itching and Rash   Morphine  Itching and Rash    Family History  Problem Relation Age of Onset   Diabetes Mother    Stroke Mother        brother, sister, MGM   Colon polyps Mother    Heart disease Father    Kidney disease Father    Colon polyps Father    Lung disease Father    Kidney cancer Sister    Stroke Sister    Diabetes Sister    Colon polyps Sister    Heart attack Paternal Grandfather    Celiac disease Other        niece   Colon cancer Neg Hx    Esophageal cancer Neg Hx    Rectal cancer Neg Hx    Stomach cancer Neg Hx    Restless legs syndrome Neg Hx    Neuropathy Neg Hx     Prior to Admission medications   Medication Sig Start Date End Date Taking? Authorizing Provider  pramipexole  (MIRAPEX ) 1 MG tablet Take 1 mg by mouth 2 (two) times daily.   Yes [provider]  FLUoxetine  (PROZAC ) 20 MG capsule TAKE 1 CAPSULE BY MOUTH EVERY DAY 11/26/23   Tabori, Katherine E, MD  furosemide  (LASIX ) 20 MG tablet TAKE 1 TABLET BY MOUTH EVERY DAY 08/20/23   Tabori, Katherine E, MD  HYDROcodone -acetaminophen  (NORCO/VICODIN) 5-325 MG tablet Take 1-2  tablets by mouth every 6 (six) hours as needed for severe pain (pain score 7-10). 07/05/23   Kristian Stabs, PA  latanoprost  (XALATAN ) 0.005 % ophthalmic solution Place 1 drop into both eyes at bedtime.  10/29/13   [provider]  losartan -hydrochlorothiazide  (HYZAAR) 50-12.5 MG tablet TAKE 1 TABLET BY MOUTH EVERY DAY 12/07/23   Tabori, Katherine E, MD  methocarbamol  (ROBAXIN ) 500 MG tablet Take 1 tablet (500 mg total) by mouth every 6 (six) hours as needed for muscle spasms. 07/05/23   Kristian Stabs, PA  omeprazole  (PRILOSEC) 20 MG capsule TAKE 1 CAPSULE 2 TIMES DAILY 30-60 MINUTES BEFORE BREAKFAST AND DINNER. 11/27/23   Beather Delon Gibson, PA  ondansetron  (ZOFRAN ) 4 MG tablet Take 1 tablet (4 mg total) by mouth every 6 (six) hours as needed for nausea. 01/15/24   Webb, Padonda B, FNP  potassium chloride  SA (KLOR-CON  M) 20 MEQ tablet  TAKE 1 TABLET BY MOUTH EVERY DAY 11/26/23   Tabori, Katherine E, MD  scopolamine  (TRANSDERM-SCOP) 1 MG/3DAYS Place 1 patch (1.5 mg total) onto the skin every 3 (three) days. 07/12/23   Tabori, Katherine E, MD  sulfamethoxazole-trimethoprim (BACTRIM DS) 800-160 MG tablet Take 1 tablet by mouth 2 (two) times daily. 01/15/24   Webb, Padonda B, FNP  tiZANidine  (ZANAFLEX ) 4 MG tablet TAKE 1 TABLET BY MOUTH THREE TIMES A DAY 12/10/23   Tabori, Katherine E, MD  Vitamin D , Ergocalciferol , (DRISDOL ) 1.25 MG (50000 UNIT) CAPS capsule Take 50,000 Units by mouth once a week. 01/31/23   [provider]    Physical Exam: Vitals:   01/18/24 0826 01/18/24 0905 01/18/24 1135 01/18/24 1250  BP:  (!) 105/53 127/61 (!) 159/80  Pulse: 62 64 (!) 59 69  Resp: (!) 21 19 13 20   Temp: 98.7 F (37.1 C)  98.4 F (36.9 C) 98.1 F (36.7 C)  TempSrc: Oral  Oral Oral  SpO2: 97% 92% 94% 98%  Weight:    88.6 kg  Height:    5' 3 (1.6 m)   Physical Exam Vitals and nursing note reviewed.  Constitutional:      General: She is awake. She is not in acute distress.     Appearance: Normal appearance. She is obese. She is ill-appearing.  HENT:     Head: Normocephalic.     Nose: No rhinorrhea.     Mouth/Throat:     Mouth: Mucous membranes are dry.  Eyes:     General: No scleral icterus.    Pupils: Pupils are equal, round, and reactive to light.  Neck:     Vascular: No JVD.  Cardiovascular:     Rate and Rhythm: Normal rate and regular rhythm.     Heart sounds: S1 normal and S2 normal.  Pulmonary:     Effort: Pulmonary effort is normal.     Breath sounds: Normal breath sounds. No wheezing, rhonchi or rales.  Abdominal:     General: Bowel sounds are normal. There is no distension.     Palpations: Abdomen is soft.     Tenderness: There is no abdominal tenderness. There is right CVA tenderness. There is no left CVA tenderness.  Musculoskeletal:     Cervical back: Neck supple.     Right lower leg: No edema.     Left lower leg: No edema.  Skin:    General: Skin is warm and dry.  Neurological:     General: No focal deficit present.     Mental Status: She is alert and oriented to person, place, and time.  Psychiatric:        Mood and Affect: Mood normal.        Behavior: Behavior normal. Behavior is cooperative.     Data Reviewed:  Results are pending, will review when available. 01/15/2017 transthoracic echocardiogram report. -------------------------------------------------------------------  Study Conclusions   - Left ventricle: The cavity size was normal. There was mild    concentric hypertrophy. Systolic function was vigorous. The    estimated ejection fraction was in the range of 65% to 70%. Wall    motion was normal; there were no regional wall motion    abnormalities. Doppler parameters are consistent with abnormal    left ventricular relaxation (grade 1 diastolic dysfunction).  - Aortic valve: Trileaflet; mildly thickened leaflets. There was no    regurgitation.  - Aortic root: The aortic root was normal in size.  - Mitral valve:  Calcified  annulus. Mildly thickened leaflets .  - Left atrium: The atrium was mildly dilated.  - Right ventricle: Systolic function was normal.  - Tricuspid valve: There was mild regurgitation.  - Pulmonary arteries: Systolic pressure was within the normal    range.  - Inferior vena cava: The vessel was normal in size.   EKG: Vent. rate 68 BPM PR interval 157 ms QRS duration 100 ms QT/QTcB 436/464 ms P-R-T axes 76 25 69 Sinus rhythm Low voltage, precordial leads  Assessment and Plan: Principal Problem:   Acute pyelonephritis U C&S growing E. coli. Observation/MedSurg. Analgesics as needed. Antiemetics as needed. Continue ceftriaxone 1 g IVPB daily. Follow-up blood culture and sensitivity. Follow-up urine culture and sensitivity. Follow CBC, CMP in AM.  Active Problems:   Essential hypertension Continue losartan  50 mg p.o. daily. Continue HCTZ 12.5 mg p.o. daily. Continue furosemide  20 mg p.o. daily. Continue potassium supplementation.    GERD Continue home PPI or formulary equivalent.    Obesity (BMI 30-39.9) Current BMI 34.60 kg/m. Would benefit from lifestyle modifications. Follow-up closely with PCP and/or bariatric clinic.    Restless legs syndrome (RLS) Continue pramipexole  1 mg p.o. twice daily.     Advance Care Planning:   Code Status: Full Code   Consults:   Family Communication:   Severity of Illness: The appropriate patient status for this patient is INPATIENT. Inpatient status is judged to be reasonable and necessary in order to provide the required intensity of service to ensure the patient's safety. The patient's presenting symptoms, physical exam findings, and initial radiographic and laboratory data in the context of their chronic comorbidities is felt to place them at high risk for further clinical deterioration. Furthermore, it is not anticipated that the patient will be medically stable for discharge from the hospital within 2 midnights of  admission.   * I certify that at the point of admission it is my clinical judgment that the patient will require inpatient hospital care spanning beyond 2 midnights from the point of admission due to high intensity of service, high risk for further deterioration and high frequency of surveillance required.*  Author: Alm Dorn Castor, MD 01/18/2024 1:32 PM  For on call review [email or web address].   This document was prepared using Dragon voice recognition software and may contain some unintended transcription errors.

## 2024-01-19 DIAGNOSIS — N10 Acute tubulo-interstitial nephritis: Secondary | ICD-10-CM | POA: Diagnosis not present

## 2024-01-19 LAB — CBC
HCT: 32.1 % — ABNORMAL LOW (ref 36.0–46.0)
Hemoglobin: 10.6 g/dL — ABNORMAL LOW (ref 12.0–15.0)
MCH: 29.3 pg (ref 26.0–34.0)
MCHC: 33 g/dL (ref 30.0–36.0)
MCV: 88.7 fL (ref 80.0–100.0)
Platelets: 253 K/uL (ref 150–400)
RBC: 3.62 MIL/uL — ABNORMAL LOW (ref 3.87–5.11)
RDW: 14.8 % (ref 11.5–15.5)
WBC: 5.7 K/uL (ref 4.0–10.5)
nRBC: 0 % (ref 0.0–0.2)

## 2024-01-19 LAB — COMPREHENSIVE METABOLIC PANEL WITH GFR
ALT: 15 U/L (ref 0–44)
AST: 22 U/L (ref 15–41)
Albumin: 3.5 g/dL (ref 3.5–5.0)
Alkaline Phosphatase: 105 U/L (ref 38–126)
Anion gap: 10 (ref 5–15)
BUN: 12 mg/dL (ref 8–23)
CO2: 29 mmol/L (ref 22–32)
Calcium: 9.3 mg/dL (ref 8.9–10.3)
Chloride: 102 mmol/L (ref 98–111)
Creatinine, Ser: 0.8 mg/dL (ref 0.44–1.00)
GFR, Estimated: >60 mL/min (ref >60)
Glucose, Bld: 93 mg/dL (ref 70–99)
Potassium: 3.9 mmol/L (ref 3.5–5.1)
Sodium: 141 mmol/L (ref 135–145)
Total Bilirubin: 0.3 mg/dL (ref 0.0–1.2)
Total Protein: 6.2 g/dL — ABNORMAL LOW (ref 6.5–8.1)

## 2024-01-19 MED ORDER — SCOPOLAMINE 1 MG/3DAYS TD PT72
1.0000 | MEDICATED_PATCH | TRANSDERMAL | Status: DC | PRN
  Administered 2024-01-19: 1 mg via TRANSDERMAL
  Filled 2024-01-19 (×2): qty 1

## 2024-01-19 MED ORDER — SODIUM CHLORIDE 0.9 % IV SOLN: INTRAVENOUS | Status: DC

## 2024-01-19 MED ORDER — CARMEX CLASSIC LIP BALM EX OINT
TOPICAL_OINTMENT | CUTANEOUS | Status: DC | PRN
  Filled 2024-01-19: qty 10

## 2024-01-19 MED ORDER — KETOROLAC TROMETHAMINE 15 MG/ML IJ SOLN: 15.0000 mg | Freq: Four times a day (QID) | INTRAMUSCULAR | Status: DC | PRN

## 2024-01-19 MED ORDER — OXYCODONE HCL 5 MG PO TABS
5.0000 mg | ORAL_TABLET | Freq: Three times a day (TID) | ORAL | Status: DC | PRN
  Administered 2024-01-19: 5 mg via ORAL
  Filled 2024-01-19: qty 1

## 2024-01-19 NOTE — Progress Notes (Signed)
 PROGRESS NOTE    Andrea Byrd  FMW:[id number] DOB: December 26, 1945 DOA: 01/17/2024 PCP: Mahlon Comer [name], MD   Brief Narrative:  78 year old female with history of cervical cancer, endometriosis, esophageal stricture, GERD, hypertension, restless leg syndrome, IBS presented with urinary frequency and vomiting.  On presentation, UA was suggestive of UTI.  CT of chest, abdomen and pelvis with contrast showed possible right sided pyelonephritis without hydronephrosis or obstructing stones.  She was started on IV antibiotics.  Assessment & Plan:   Probable acute right pyelonephritis - Imaging as above.  Continues to have intermittent pain.  Does not feel ready to go home today.  Continue IV Rocephin.  Follow cultures.  Essential hypertension Monitor blood pressure.  Continue Lasix , hydrochlorothiazide , losartan   Restless leg syndrome - Continue pramipexole   GERD Continue PPI  Obesity class I - Outpatient follow-up  Anemia of chronic disease - From chronic illnesses.  Hemoglobin stable.  Monitor intermittently  Hypokalemia - Resolved  DVT prophylaxis: SCDs Code Status: Full Family Communication: None at bedside Disposition Plan: Status is: Inpatient Remains inpatient appropriate because: Of severity of illness.  Need for IV antibiotics.    Consultants: None  Procedures: None  Antimicrobials: Rocephin from 01/17/2024 onwards   Subjective: Patient seen and examined at bedside.  Continues to have intermittent flank pain.  Feels weak and intermittently dizzy.  No fever or vomiting reported.  Objective: Vitals:   01/18/24 2034 01/19/24 0045 01/19/24 0513 01/19/24 1003  BP: (!) 118/57 (!) 158/81 138/72 (!) 148/84  Pulse: 74 73 74 74  Resp: 12 17 18 20   Temp: 99.1 F (37.3 C) 98.6 F (37 C) 99.2 F (37.3 C) 98.3 F (36.8 C)  TempSrc:  Oral Oral   SpO2: 94% 94% 95% 94%  Weight:      Height:        Intake/Output Summary (Last 24 hours) at 01/19/2024 1008 Last data  filed at 01/19/2024 9474 Gross per 24 hour  Intake 857.86 ml  Output --  Net 857.86 ml   Filed Weights   01/18/24 1250  Weight: 88.6 kg    Examination:  General exam: Appears calm and comfortable  Respiratory system: Bilateral decreased breath sounds at bases Cardiovascular system: S1 & S2 heard, Rate controlled Gastrointestinal system: Abdomen is obese, nondistended, soft and nontender. Normal bowel sounds heard. Extremities: No cyanosis, clubbing, edema  Central nervous system: Alert and oriented. No focal neurological deficits. Moving extremities Skin: No rashes, lesions or ulcers Psychiatry: Judgement and insight appear normal. Mood & affect appropriate.  Genitourinary: Right CVA tenderness present    Data Reviewed: I have personally reviewed following labs and imaging studies  CBC: Recent Labs  Lab 01/17/24 1632 01/18/24 1451 01/19/24 0302  WBC 5.0 6.0 5.7  NEUTROABS 3.4  --   --   HGB 11.2* 11.8* 10.6*  HCT 34.2* 37.5 32.1*  MCV 87.5 89.9 88.7  PLT 257 256 253   Basic Metabolic Panel: Recent Labs  Lab 01/17/24 1632 01/18/24 1451 01/19/24 0302  NA 139 142 141  K 3.1* 3.8 3.9  CL 100 100 102  CO2 29 31 29   GLUCOSE 108* 93 93  BUN 17 10 12   CREATININE 1.05* 0.85 0.80  CALCIUM 9.0 9.6 9.3  MG  --  2.1  --   PHOS  --  2.8  --    GFR: Estimated Creatinine Clearance: 62.2 mL/min (by C-G formula based on SCr of 0.8 mg/dL). Liver Function Tests: Recent Labs  Lab 01/17/24 1632 01/18/24 1451 01/19/24  0302  AST 29 28 22   ALT 20 22 15   ALKPHOS 133* 126 105  BILITOT 0.3 0.4 0.3  PROT 6.5 7.0 6.2*  ALBUMIN  3.8 3.9 3.5   Recent Labs  Lab 01/17/24 1632  LIPASE 21   No results for input(s): AMMONIA in the last 168 hours. Coagulation Profile: No results for input(s): INR, PROTIME in the last 168 hours. Cardiac Enzymes: No results for input(s): CKTOTAL, CKMB, CKMBINDEX, TROPONINI in the last 168 hours. BNP (last 3 results) No results  for input(s): PROBNP in the last 8760 hours. HbA1C: No results for input(s): HGBA1C in the last 72 hours. CBG: No results for input(s): GLUCAP in the last 168 hours. Lipid Profile: No results for input(s): CHOL, HDL, LDLCALC, TRIG, CHOLHDL, LDLDIRECT in the last 72 hours. Thyroid  Function Tests: No results for input(s): TSH, T4TOTAL, FREET4, T3FREE, THYROIDAB in the last 72 hours. Anemia Panel: No results for input(s): VITAMINB12, FOLATE, FERRITIN, TIBC, IRON, RETICCTPCT in the last 72 hours. Sepsis Labs: Recent Labs  Lab 01/17/24 1632  LATICACIDVEN 1.2    Recent Results (from the past 240 hours)  Urine Culture     Status: Abnormal   Collection Time: 01/15/24  4:44 PM   Specimen: Urine  Result Value Ref Range Status   MICRO NUMBER: [id number]  Final   SPECIMEN QUALITY: Adequate  Final   Sample Source NOT GIVEN  Final   STATUS: FINAL  Final   ISOLATE 1: Escherichia coli (A)  Final    Comment: 50,000-99,000 CFU/mL of Escherichia coli      Susceptibility   Escherichia coli - URINE CULTURE, REFLEX    AMOX/CLAVULANIC 8 Sensitive     AMPICILLIN/SULBACTAM 16 Intermediate     CEFAZOLIN * <=1 Not Reportable      * For infections other than uncomplicated UTI caused by E. coli, K. pneumoniae or P. mirabilis: Cefazolin  is resistant if MIC > or = 8 mcg/mL. (Distinguishing susceptible versus intermediate for isolates with MIC < or = 4 mcg/mL requires additional testing.) For uncomplicated UTI caused by E. coli, K. pneumoniae or P. mirabilis: Cefazolin  is susceptible if MIC <32 mcg/mL and predicts susceptible to the oral agents cefaclor, cefdinir, cefpodoxime, cefprozil, cefuroxime, cephalexin and loracarbef.     CEFTAZIDIME <=0.5 Sensitive     CEFEPIME <=0.12 Sensitive     CEFTRIAXONE <=0.25 Sensitive     CIPROFLOXACIN <=0.06 Sensitive     LEVOFLOXACIN <=0.12 Sensitive     GENTAMICIN <=1 Sensitive     IMIPENEM <=0.25 Sensitive     MEROPENEM  <=0.25 Sensitive     NITROFURANTOIN <=16 Sensitive     PIP/TAZO <=4 Sensitive     TRIMETH/SULFA* <=20 Sensitive      * For infections other than uncomplicated UTI caused by E. coli, K. pneumoniae or P. mirabilis: Cefazolin  is resistant if MIC > or = 8 mcg/mL. (Distinguishing susceptible versus intermediate for isolates with MIC < or = 4 mcg/mL requires additional testing.) For uncomplicated UTI caused by E. coli, K. pneumoniae or P. mirabilis: Cefazolin  is susceptible if MIC <32 mcg/mL and predicts susceptible to the oral agents cefaclor, cefdinir, cefpodoxime, cefprozil, cefuroxime, cephalexin and loracarbef. Legend: S = Susceptible  I = Intermediate R = Resistant  NS = Not susceptible SDD = Susceptible Dose Dependent * = Not Tested  NR = Not Reported **NN = See Therapy Comments   Blood culture (routine x 2)     Status: None (Preliminary result)   Collection Time: 01/17/24  4:20 PM   Specimen:  BLOOD  Result Value Ref Range Status   Specimen Description   Final    BLOOD LEFT ANTECUBITAL Performed at [facility], [address], [place], [place] [address]    Special Requests   Final    Blood Culture adequate volume BOTTLES DRAWN AEROBIC AND ANAEROBIC Performed at [facility], [address] [place], [place] [address]    Culture   Final    NO GROWTH < 12 HOURS Performed at [facility], [address], [place], [place] [address]    Report Status PENDING  Incomplete  Blood culture (routine x 2)     Status: None (Preliminary result)   Collection Time: 01/17/24  4:25 PM   Specimen: BLOOD  Result Value Ref Range Status   Specimen Description   Final    BLOOD RIGHT ANTECUBITAL Performed at [facility], [address], [place], [place] [address]    Special Requests   Final    Blood Culture adequate volume BOTTLES DRAWN AEROBIC AND ANAEROBIC Performed at [facility], [address] [place], [place] [address]    Culture   Final    NO GROWTH < 12 HOURS Performed at [facility], [address], [place], [place] [address]    Report Status PENDING  Incomplete  Urine Culture     Status: None   Collection Time: 01/17/24  4:42 PM   Specimen: Urine, Clean Catch  Result Value Ref Range Status   Specimen Description   Final    URINE, CLEAN CATCH Performed at [facility], [address], [place], [place] [address]    Special Requests   Final    NONE Performed at [facility], [address] [place], [place] [address]    Culture   Final    NO GROWTH Performed at [facility], [address], [place], [place] [address]    Report Status 01/18/2024 FINAL  Final  Resp panel by RT-PCR (RSV, Flu A&B, Covid) Anterior Nasal Swab     Status: None   Collection Time: 01/17/24  4:42 PM   Specimen: Anterior Nasal Swab  Result Value Ref Range Status   SARS Coronavirus 2 by RT PCR NEGATIVE NEGATIVE Final    Comment: (NOTE) SARS-CoV-2 target nucleic acids are NOT DETECTED.  The SARS-CoV-2 RNA is generally detectable in upper respiratory specimens during the acute phase of infection. The lowest concentration of SARS-CoV-2 viral copies this assay can detect is 138 copies/mL. A negative result does not preclude SARS-Cov-2 infection and should not be used as the sole basis for treatment or other patient management decisions. A negative result may occur with  improper specimen collection/handling, submission of specimen other than nasopharyngeal swab, presence of viral mutation(s) within the areas targeted by this assay, and inadequate number of viral copies(<138 copies/mL). A negative result must be combined with clinical observations, patient history, and epidemiological information. The expected result is Negative.  Fact Sheet for Patients:  bloggercourse.com  Fact Sheet for Healthcare Providers:   seriousbroker.it  This test is no t yet approved or cleared by the [place]  FDA and  has been authorized for detection and/or diagnosis of SARS-CoV-2 by FDA under an Emergency Use Authorization (EUA). This EUA will remain  in effect (meaning this test can be used) for the duration of the COVID-19 declaration under Section 564(b)(1) of the Act, 21 U.S.C.section 360bbb-3(b)(1), unless the authorization is terminated  or revoked sooner.  Influenza A by PCR NEGATIVE NEGATIVE Final   Influenza B by PCR NEGATIVE NEGATIVE Final    Comment: (NOTE) The Xpert Xpress SARS-CoV-2/FLU/RSV plus assay is intended as an aid in the diagnosis of influenza from Nasopharyngeal swab specimens and should not be used as a sole basis for treatment. Nasal washings and aspirates are unacceptable for Xpert Xpress SARS-CoV-2/FLU/RSV testing.  Fact Sheet for Patients: bloggercourse.com  Fact Sheet for Healthcare Providers: seriousbroker.it  This test is not yet approved or cleared by the [place]  FDA and has been authorized for detection and/or diagnosis of SARS-CoV-2 by FDA under an Emergency Use Authorization (EUA). This EUA will remain in effect (meaning this test can be used) for the duration of the COVID-19 declaration under Section 564(b)(1) of the Act, 21 U.S.C. section 360bbb-3(b)(1), unless the authorization is terminated or revoked.     Resp Syncytial Virus by PCR NEGATIVE NEGATIVE Final    Comment: (NOTE) Fact Sheet for Patients: bloggercourse.com  Fact Sheet for Healthcare Providers: seriousbroker.it  This test is not yet approved or cleared by the [place]  FDA and has been authorized for detection and/or diagnosis of SARS-CoV-2 by FDA under an Emergency Use Authorization (EUA). This EUA will remain in effect (meaning this test can be used) for  the duration of the COVID-19 declaration under Section 564(b)(1) of the Act, 21 U.S.C. section 360bbb-3(b)(1), unless the authorization is terminated or revoked.  Performed at [facility], [address], [place], [place] [address]          Radiology Studies: CT CHEST ABDOMEN PELVIS W CONTRAST Result Date: 01/17/2024 CLINICAL DATA:  Fever nausea vomiting EXAM: CT CHEST, ABDOMEN, AND PELVIS WITH CONTRAST TECHNIQUE: Multidetector CT imaging of the chest, abdomen and pelvis was performed following the standard protocol during bolus administration of intravenous contrast. RADIATION DOSE REDUCTION: This exam was performed according to the departmental dose-optimization program which includes automated exposure control, adjustment of the mA and/or kV according to patient size and/or use of iterative reconstruction technique. CONTRAST:  OMNIPAQUE  IOHEXOL  300 MG/ML  SOLN COMPARISON:  Chest x-ray 01/17/2024, CT 09/01/2019 FINDINGS: CT CHEST FINDINGS Cardiovascular: Moderate aortic atherosclerosis. No aneurysm. Mild coronary vascular calcification. Mitral calcification. Normal cardiac size. No pericardial effusion Mediastinum/Nodes: Patent trachea. No thyroid  mass. No suspicious lymph nodes. Small moderate hiatal hernia Lungs/Pleura: No acute airspace disease, pleural effusion or pneumothorax Musculoskeletal: No acute or suspicious osseous abnormality. CT ABDOMEN PELVIS FINDINGS Hepatobiliary: No focal liver abnormality is seen. Status post cholecystectomy. No biliary dilatation. Pancreas: Unremarkable. No pancreatic ductal dilatation or surrounding inflammatory changes. Spleen: Normal in size without focal abnormality. Adrenals/Urinary Tract: Adrenal glands are normal. No hydronephrosis. Patchy cortical hypoenhancement of the right kidney. Mild urothelial enhancement of the right renal pelvis. No obstructing stone. The bladder is normal Stomach/Bowel: The stomach is nonenlarged.  There is no dilated small bowel. No acute bowel wall thickening. Diverticular disease of the colon Vascular/Lymphatic: Aortic atherosclerosis. No enlarged abdominal or pelvic lymph nodes. Reproductive: Status post hysterectomy. No adnexal masses. Other: Negative for ascites or free air. Small fat containing umbilical hernia Musculoskeletal: Grade 1 anterolisthesis L3 on L4. Advanced degenerative changes L4-L5 and L5-S1. No acute osseous abnormality IMPRESSION: 1. Patchy cortical hypoenhancement of the right kidney with mild urothelial enhancement of the right renal pelvis, findings are suspicious for pyelonephritis. No hydronephrosis or obstructing stones. 2. No CT evidence for acute intrathoracic abnormality. No evidence for lung mass or airspace disease. Opacity on chest x-ray is felt secondary to normal vascular structures.  3. Diverticular disease of the colon without acute inflammatory process. 4. Aortic atherosclerosis. Aortic Atherosclerosis (ICD10-I70.0). Electronically Signed   By: Luke Bun M.D.   On: 01/17/2024 19:14   CT Head Wo Contrast Result Date: 01/17/2024 CLINICAL DATA:  Vertigo dizziness EXAM: CT HEAD WITHOUT CONTRAST TECHNIQUE: Contiguous axial images were obtained from the base of the skull through the vertex without intravenous contrast. RADIATION DOSE REDUCTION: This exam was performed according to the departmental dose-optimization program which includes automated exposure control, adjustment of the mA and/or kV according to patient size and/or use of iterative reconstruction technique. COMPARISON:  None Available. FINDINGS: Brain: No acute territorial infarction, hemorrhage or intracranial mass. Patchy white matter hypodensity consistent with chronic small vessel disease. Normal ventricle size. Small chronic appearing lacunar infarct in the region of the left caudate. Vascular: No hyperdense vessels.  Carotid vascular calcification Skull: Normal. Negative for fracture or focal lesion.  Sinuses/Orbits: Moderate mucosal disease in the right maxillary sinus Other: .  None IMPRESSION: 1. No CT evidence for acute intracranial abnormality. 2. Mild chronic small vessel disease of the white matter. Electronically Signed   By: Luke Bun M.D.   On: 01/17/2024 19:05   DG Chest 2 View Result Date: 01/17/2024 CLINICAL DATA:  Fever EXAM: CHEST - 2 VIEW COMPARISON:  Report 08/16/2006 FINDINGS: Right shoulder replacement. Streaky left lung base opacity, favor atelectasis. Borderline cardiomegaly. No pleural effusion or pneumothorax. Aortic atherosclerosis. Vague right apical lung opacity. IMPRESSION: 1. Streaky left lung base opacity, favor atelectasis. 2. Vague right apical lung opacity, recommend further assessment with chest CT, preferably with contrast. Electronically Signed   By: Luke Bun M.D.   On: 01/17/2024 17:46        Scheduled Meds:  FLUoxetine   20 mg Oral Daily   furosemide   20 mg Oral Daily   hydrochlorothiazide   12.5 mg Oral Daily   And   losartan   50 mg Oral Daily   latanoprost   1 drop Both Eyes QHS   pantoprazole   40 mg Oral Daily   potassium chloride  SA  20 mEq Oral Daily   pramipexole   1 mg Oral BID   Continuous Infusions:  cefTRIAXone (ROCEPHIN)  IV Stopped (01/18/24 1818)   promethazine  (PHENERGAN ) injection (IM or IVPB) Stopped (01/19/24 0126)          Sophie Mao, MD [facility] 01/19/2024, 10:08 AM

## 2024-01-19 NOTE — Progress Notes (Addendum)
 Mobility Specialist Progress Note:   01/19/24 1556  Orthostatic Lying   BP- Lying 141/64  Pulse- Lying 70  Orthostatic Sitting  BP- Sitting 155/87  Pulse- Sitting 81  Orthostatic Standing at 0 minutes  BP- Standing at 0 minutes 164/75  Pulse- Standing at 0 minutes 81  Orthostatic Standing at 3 minutes  BP- Standing at 3 minutes (!) 180/91  Pulse- Standing at 3 minutes 87  Mobility  Activity Stood at bedside  Level of Assistance Standby assist, set-up cues, supervision of patient - no hands on  Assistive Device None  Activity Response Tolerated fair  Mobility Referral Yes  Mobility visit 1 Mobility  Mobility Specialist Start Time (ACUTE ONLY) 1451  Mobility Specialist Stop Time (ACUTE ONLY) 1505  Mobility Specialist Time Calculation (min) (ACUTE ONLY) 14 min   Orthostatics were taken per request of RN. Pt stated slight lightheadedness while standing. Returned to bed with all needs met. Call bell in reach. Left in room with family.  [employer] - Mobility Specialist

## 2024-01-19 NOTE — Evaluation (Signed)
 Physical Therapy Evaluation Patient Details Name: Andrea Byrd MRN: [id number] DOB: Dec 02, 1945 Today's Date: 01/19/2024  History of Present Illness  Pt admitted from home with Pyelonephritis and with hx of RLS, Glaucoma, R TSR, R THR, and peripheral neuropathy affecting bil feet.  Clinical Impression  Pt admitted as above and currently demonstrating functional mobility limitations 2* balance deficits with gait.  Pt up to ambulate in hall and ambulated 200' sans AD and 150' with SPC. Pt demonstrating instability throughout and reaching for walls to stabilize. Pt admits to some furniture walking at home but is not at baseline for speed or stability.  Pt states she has access to [facility] or [facility] as needed from family and is interested in follow up HHPT to further address safety and stability with functioning at home.         If plan is discharge home, recommend the following: A little help with walking and/or transfers;Assist for transportation;Help with stairs or ramp for entrance   Can travel by private vehicle        Equipment Recommendations None recommended by PT  Recommendations for Other Services  OT consult    Functional Status Assessment Patient has had a recent decline in their functional status and demonstrates the ability to make significant improvements in function in a reasonable and predictable amount of time.     Precautions / Restrictions Precautions Precautions: Fall Precaution/Restrictions Comments: Balance deficits associated with peripheral neuropathy Restrictions Weight Bearing Restrictions Per Provider Order: No      Mobility  Bed Mobility Overal bed mobility: Modified Independent                  Transfers Overall transfer level: Needs assistance Equipment used: None Transfers: Sit to/from Stand Sit to Stand: Supervision           General transfer comment: Mild instability with standing but no overt LOB    Ambulation/Gait Ambulation/Gait  assistance: Contact guard assist Gait Distance (Feet): 350 Feet Assistive device: None, Straight cane Gait Pattern/deviations: Step-through pattern, Decreased step length - right, Decreased step length - left, Shuffle, Wide base of support Gait velocity: decreased from norm     General Gait Details: Pt ambulated 200' sans AD and 150' with SPC.  Pt  demonstrating instability throughout and reaching for walls to stabilize.  Pt reports this is her norm and she generally utilizes furniture and walls to stabilize at home.  Stairs            Wheelchair Mobility     Tilt Bed    Modified Rankin (Stroke Patients Only)       Balance Overall balance assessment: Needs assistance Sitting-balance support: No upper extremity supported, Feet supported Sitting balance-Leahy Scale: Good     Standing balance support: No upper extremity supported Standing balance-Leahy Scale:  (Fair +)                               Pertinent Vitals/Pain Pain Assessment Pain Assessment: No/denies pain    Home Living Family/patient expects to be discharged to:: Private residence Living Arrangements: Alone Available Help at Discharge: Family;Available PRN/intermittently Type of Home: House Home Access: Stairs to enter   Entrance Stairs-Number of Steps: 1   Home Layout: One level Home Equipment: Agricultural Consultant (2 wheels);Cane - single point      Prior Function Prior Level of Function : Independent/Modified Independent  Mobility Comments: Pt states generally furniture walks at home 2* instability at baseline       Extremity/Trunk Assessment   Upper Extremity Assessment Upper Extremity Assessment: Overall WFL for tasks assessed    Lower Extremity Assessment Lower Extremity Assessment: RLE deficits/detail;LLE deficits/detail RLE Deficits / Details: Bil LE strength and ROM WFL RLE Sensation: history of peripheral neuropathy LLE Sensation: history of peripheral  neuropathy    Cervical / Trunk Assessment Cervical / Trunk Assessment: Normal  Communication   Communication Communication: No apparent difficulties    Cognition Arousal: Alert Behavior During Therapy: WFL for tasks assessed/performed   PT - Cognitive impairments: No apparent impairments                         Following commands: Intact       Cueing Cueing Techniques: Verbal cues     General Comments      Exercises     Assessment/Plan    PT Assessment Patient needs continued PT services  PT Problem List Decreased activity tolerance;Decreased balance;Decreased mobility;Decreased knowledge of use of DME       PT Treatment Interventions DME instruction;Gait training;Stair training;Functional mobility training;Therapeutic activities;Therapeutic exercise;Balance training;Patient/family education    PT Goals (Current goals can be found in the Care Plan section)  Acute Rehab PT Goals Patient Stated Goal: REgain IND and recover at home PT Goal Formulation: With patient Time For Goal Achievement: 02/02/24 Potential to Achieve Goals: Good    Frequency Min 3X/week     Co-evaluation               AM-PAC PT 6 Clicks Mobility  Outcome Measure Help needed turning from your back to your side while in a flat bed without using bedrails?: None Help needed moving from lying on your back to sitting on the side of a flat bed without using bedrails?: None Help needed moving to and from a bed to a chair (including a wheelchair)?: None Help needed standing up from a chair using your arms (e.g., wheelchair or bedside chair)?: A Little Help needed to walk in hospital room?: A Little Help needed climbing 3-5 steps with a railing? : A Little 6 Click Score: 21    End of Session Equipment Utilized During Treatment: Gait belt Activity Tolerance: Patient tolerated treatment well Patient left: in chair;with call bell/phone within reach Nurse Communication: Mobility  status PT Visit Diagnosis: Unsteadiness on feet (R26.81);Difficulty in walking, not elsewhere classified (R26.2)    Time: 8476-8454 PT Time Calculation (min) (ACUTE ONLY): 22 min   Charges:   PT Evaluation $PT Eval Low Complexity: 1 Low   PT General Charges $$ ACUTE PT VISIT: 1 Visit         [facility] PT Acute Rehabilitation Services Office [phone]   Tanique Matney 01/19/2024, 4:12 PM

## 2024-01-20 DIAGNOSIS — N10 Acute tubulo-interstitial nephritis: Secondary | ICD-10-CM | POA: Diagnosis not present

## 2024-01-20 MED ORDER — CEPHALEXIN 500 MG PO CAPS: 500.0000 mg | ORAL_CAPSULE | Freq: Three times a day (TID) | ORAL | 0 refills | Status: AC

## 2024-01-20 MED ORDER — HYDROCODONE-ACETAMINOPHEN 5-325 MG PO TABS: 1.0000 | ORAL_TABLET | Freq: Four times a day (QID) | ORAL | 0 refills | Status: DC | PRN

## 2024-01-20 MED ORDER — POLYETHYLENE GLYCOL 3350 17 G PO PACK: 17.0000 g | PACK | Freq: Every day | ORAL | 0 refills | Status: AC | PRN

## 2024-01-20 MED ORDER — SCOPOLAMINE 1 MG/3DAYS TD PT72: 1.0000 | MEDICATED_PATCH | TRANSDERMAL | Status: DC | PRN

## 2024-01-20 MED ORDER — TIZANIDINE HCL 4 MG PO TABS: 4.0000 mg | ORAL_TABLET | ORAL | Status: DC

## 2024-01-20 MED ORDER — FUROSEMIDE 20 MG PO TABS
20.0000 mg | ORAL_TABLET | Freq: Every day | ORAL | Status: AC | PRN
Start: 2024-01-20 — End: ?

## 2024-01-20 NOTE — Plan of Care (Signed)
  Problem: Coping: Goal: Level of anxiety will decrease 01/20/2024 0206 by Trudy Maus, RN Outcome: Progressing 01/20/2024 0206 by Trudy Maus, RN Outcome: Progressing   Problem: Pain Managment: Goal: General experience of comfort will improve and/or be controlled 01/20/2024 0206 by Trudy Maus, RN Outcome: Progressing 01/20/2024 0206 by Trudy Maus, RN Outcome: Progressing   Problem: Safety: Goal: Ability to remain free from injury will improve 01/20/2024 0206 by Trudy Maus, RN Outcome: Progressing 01/20/2024 0206 by Trudy Maus, RN Outcome: Progressing

## 2024-01-20 NOTE — Discharge Summary (Signed)
 Physician Discharge Summary  Andrea Byrd FMW:[id number] DOB: 07-28-1945 DOA: 01/17/2024  PCP: Andrea Comer [name], MD  Admit date: 01/17/2024 Discharge date: 01/20/2024  Admitted From: Home Disposition: Home  Recommendations for Outpatient Follow-up:  Follow up with PCP in 1 week with repeat CBC/BMP Follow up in ED if symptoms worsen or new appear   Home Health: PT Equipment/Devices: None  Discharge Condition: Stable CODE STATUS: Full Diet recommendation: Heart healthy  Brief/Interim Summary: 78 year old female with history of cervical cancer, endometriosis, esophageal stricture, GERD, hypertension, restless leg syndrome, IBS presented with urinary frequency and vomiting. On presentation, UA was suggestive of UTI. CT of chest, abdomen and pelvis with contrast showed possible right sided pyelonephritis without hydronephrosis or obstructing stones. She was started on IV antibiotics.  Subsequently, her condition has improved.  She is currently tolerating diet, hemodynamically stable and feels better.  She feels okay to go home today.  She will be discharged home today on oral Keflex.  Discharge Diagnoses:   Probable acute right pyelonephritis - Imaging as above.  Currently on IV Rocephin.  Recent outpatient urine cultures had grown E. coli. - Subsequently, her condition has improved.  She is currently tolerating diet, hemodynamically stable and feels better.  She feels okay to go home today.  She will be discharged home today on oral Keflex for 7 days.   Essential hypertension -Monitor blood pressure.  Continue home regimen  Restless leg syndrome - Continue pramipexole    GERD -Continue PPI   Obesity class I - Outpatient follow-up   Anemia of chronic disease - From chronic illnesses.  Hemoglobin stable.  Monitor intermittently as an outpatient   Hypokalemia - Resolved.  Continue replacement.   Discharge Instructions  Discharge Instructions     Diet - low sodium heart  healthy   Complete by: As directed    Increase activity slowly   Complete by: As directed       Allergies as of 01/20/2024       Reactions   Nitrofurantoin Other (See Comments)   Makes mouth break out/blisters   Propoxyphene N-acetaminophen  Nausea Only   Nortriptyline  Hcl Nausea And Vomiting   Hydrocodone  Bit-homatrop Mbr Itching, Rash   Morphine  Itching, Rash        Medication List     STOP taking these medications    methocarbamol  500 MG tablet Commonly known as: ROBAXIN    sulfamethoxazole-trimethoprim 800-160 MG tablet Commonly known as: BACTRIM DS       TAKE these medications    Aleve 220 MG tablet Generic drug: naproxen sodium Take 220 mg by mouth with breakfast, with lunch, and with evening meal.   cephALEXin 500 MG capsule Commonly known as: KEFLEX Take 1 capsule (500 mg total) by mouth 3 (three) times daily for 7 days.   FLUoxetine  20 MG capsule Commonly known as: PROZAC  TAKE 1 CAPSULE BY MOUTH EVERY DAY   furosemide  20 MG tablet Commonly known as: LASIX  Take 1 tablet (20 mg total) by mouth daily as needed (for swelling of the legs).   HYDROcodone -acetaminophen  5-325 MG tablet Commonly known as: NORCO/VICODIN Take 1-2 tablets by mouth every 6 (six) hours as needed for severe pain (pain score 7-10).   latanoprost  0.005 % ophthalmic solution Commonly known as: XALATAN  Place 1 drop into both eyes at bedtime.   losartan -hydrochlorothiazide  50-12.5 MG tablet Commonly known as: HYZAAR TAKE 1 TABLET BY MOUTH EVERY DAY   omeprazole  20 MG capsule Commonly known as: PRILOSEC TAKE 1 CAPSULE 2 TIMES DAILY 30-60 MINUTES  BEFORE BREAKFAST AND DINNER. What changed: See the new instructions.   ondansetron  4 MG tablet Commonly known as: ZOFRAN  Take 1 tablet (4 mg total) by mouth every 6 (six) hours as needed for nausea.   polyethylene glycol 17 g packet Commonly known as: MIRALAX  / GLYCOLAX  Take 17 g by mouth daily as needed for severe constipation.    potassium chloride  SA 20 MEQ tablet Commonly known as: KLOR-CON  M TAKE 1 TABLET BY MOUTH EVERY DAY   pramipexole  1 MG tablet Commonly known as: MIRAPEX  Take 2 mg by mouth at bedtime.   scopolamine  1 MG/3DAYS Commonly known as: TRANSDERM-SCOP Place 1 patch (1 mg total) onto the skin every 3 (three) days as needed (for vertigo).   tiZANidine  4 MG tablet Commonly known as: ZANAFLEX  Take 1-2 tablets (4-8 mg total) by mouth See admin instructions. Take 8 mg by mouth at bedtime and an additional 4 mg once a day as needed for spasms        Contact information for follow-up providers     Byrd, Andrea E, MD. Schedule an appointment as soon as possible for a visit in 1 week(s).   Specialty: Family Medicine Contact information: [address] [place] [address] [phone]              Contact information for after-discharge care     Home Medical Care     [facility]) .   Service: Home Health Services Contact information: [address] [place]  [address] [phone]                    Allergies  Allergen Reactions   Nitrofurantoin Other (See Comments)    Makes mouth break out/blisters   Propoxyphene N-Acetaminophen  Nausea Only   Nortriptyline  Hcl Nausea And Vomiting   Hydrocodone  Bit-Homatrop Mbr Itching and Rash   Morphine  Itching and Rash    Consultations: None   Procedures/Studies: CT CHEST ABDOMEN PELVIS W CONTRAST Result Date: 01/17/2024 CLINICAL DATA:  Fever nausea vomiting EXAM: CT CHEST, ABDOMEN, AND PELVIS WITH CONTRAST TECHNIQUE: Multidetector CT imaging of the chest, abdomen and pelvis was performed following the standard protocol during bolus administration of intravenous contrast. RADIATION DOSE REDUCTION: This exam was performed according to the departmental dose-optimization program which includes automated exposure control, adjustment of the mA and/or kV according to patient  size and/or use of iterative reconstruction technique. CONTRAST:  OMNIPAQUE  IOHEXOL  300 MG/ML  SOLN COMPARISON:  Chest x-ray 01/17/2024, CT 09/01/2019 FINDINGS: CT CHEST FINDINGS Cardiovascular: Moderate aortic atherosclerosis. No aneurysm. Mild coronary vascular calcification. Mitral calcification. Normal cardiac size. No pericardial effusion Mediastinum/Nodes: Patent trachea. No thyroid  mass. No suspicious lymph nodes. Small moderate hiatal hernia Lungs/Pleura: No acute airspace disease, pleural effusion or pneumothorax Musculoskeletal: No acute or suspicious osseous abnormality. CT ABDOMEN PELVIS FINDINGS Hepatobiliary: No focal liver abnormality is seen. Status post cholecystectomy. No biliary dilatation. Pancreas: Unremarkable. No pancreatic ductal dilatation or surrounding inflammatory changes. Spleen: Normal in size without focal abnormality. Adrenals/Urinary Tract: Adrenal glands are normal. No hydronephrosis. Patchy cortical hypoenhancement of the right kidney. Mild urothelial enhancement of the right renal pelvis. No obstructing stone. The bladder is normal Stomach/Bowel: The stomach is nonenlarged. There is no dilated small bowel. No acute bowel wall thickening. Diverticular disease of the colon Vascular/Lymphatic: Aortic atherosclerosis. No enlarged abdominal or pelvic lymph nodes. Reproductive: Status post hysterectomy. No adnexal masses. Other: Negative for ascites or free air. Small fat containing umbilical  hernia Musculoskeletal: Grade 1 anterolisthesis L3 on L4. Advanced degenerative changes L4-L5 and L5-S1. No acute osseous abnormality IMPRESSION: 1. Patchy cortical hypoenhancement of the right kidney with mild urothelial enhancement of the right renal pelvis, findings are suspicious for pyelonephritis. No hydronephrosis or obstructing stones. 2. No CT evidence for acute intrathoracic abnormality. No evidence for lung mass or airspace disease. Opacity on chest x-ray is felt secondary to  normal vascular structures. 3. Diverticular disease of the colon without acute inflammatory process. 4. Aortic atherosclerosis. Aortic Atherosclerosis (ICD10-I70.0). Electronically Signed   By: Luke Bun M.D.   On: 01/17/2024 19:14   CT Head Wo Contrast Result Date: 01/17/2024 CLINICAL DATA:  Vertigo dizziness EXAM: CT HEAD WITHOUT CONTRAST TECHNIQUE: Contiguous axial images were obtained from the base of the skull through the vertex without intravenous contrast. RADIATION DOSE REDUCTION: This exam was performed according to the departmental dose-optimization program which includes automated exposure control, adjustment of the mA and/or kV according to patient size and/or use of iterative reconstruction technique. COMPARISON:  None Available. FINDINGS: Brain: No acute territorial infarction, hemorrhage or intracranial mass. Patchy white matter hypodensity consistent with chronic small vessel disease. Normal ventricle size. Small chronic appearing lacunar infarct in the region of the left caudate. Vascular: No hyperdense vessels.  Carotid vascular calcification Skull: Normal. Negative for fracture or focal lesion. Sinuses/Orbits: Moderate mucosal disease in the right maxillary sinus Other: .  None IMPRESSION: 1. No CT evidence for acute intracranial abnormality. 2. Mild chronic small vessel disease of the white matter. Electronically Signed   By: Luke Bun M.D.   On: 01/17/2024 19:05   DG Chest 2 View Result Date: 01/17/2024 CLINICAL DATA:  Fever EXAM: CHEST - 2 VIEW COMPARISON:  Report 08/16/2006 FINDINGS: Right shoulder replacement. Streaky left lung base opacity, favor atelectasis. Borderline cardiomegaly. No pleural effusion or pneumothorax. Aortic atherosclerosis. Vague right apical lung opacity. IMPRESSION: 1. Streaky left lung base opacity, favor atelectasis. 2. Vague right apical lung opacity, recommend further assessment with chest CT, preferably with contrast. Electronically Signed   By: Luke Bun M.D.   On: 01/17/2024 17:46      Subjective: Patient seen and examined at bedside.  Still having intermittent flank pain.  Negative.  No fever or vomiting reported.  Discharge Exam: Vitals:   01/19/24 2154 01/20/24 0535  BP: (!) 166/80 (!) 169/75  Pulse: 66 71  Resp: 16 18  Temp: 98.3 F (36.8 C) 97.9 F (36.6 C)  SpO2: 95% 94%    General: Pt is alert, awake, not in acute distress.  On room air. Cardiovascular: rate controlled, S1/S2 + Respiratory: bilateral decreased breath sounds at bases Abdominal: Soft, obese, NT, ND, bowel sounds + Extremities: Trace lower extremity edema present; no cyanosis    The results of significant diagnostics from this hospitalization (including imaging, microbiology, ancillary and laboratory) are listed below for reference.     Microbiology: Recent Results (from the past 240 hours)  Urine Culture     Status: Abnormal   Collection Time: 01/15/24  4:44 PM   Specimen: Urine  Result Value Ref Range Status   MICRO NUMBER: [id number]  Final   SPECIMEN QUALITY: Adequate  Final   Sample Source NOT GIVEN  Final   STATUS: FINAL  Final   ISOLATE 1: Escherichia coli (A)  Final    Comment: 50,000-99,000 CFU/mL of Escherichia coli      Susceptibility   Escherichia coli - URINE CULTURE, REFLEX    AMOX/CLAVULANIC 8 Sensitive  AMPICILLIN/SULBACTAM 16 Intermediate     CEFAZOLIN * <=1 Not Reportable      * For infections other than uncomplicated UTI caused by E. coli, K. pneumoniae or P. mirabilis: Cefazolin  is resistant if MIC > or = 8 mcg/mL. (Distinguishing susceptible versus intermediate for isolates with MIC < or = 4 mcg/mL requires additional testing.) For uncomplicated UTI caused by E. coli, K. pneumoniae or P. mirabilis: Cefazolin  is susceptible if MIC <32 mcg/mL and predicts susceptible to the oral agents cefaclor, cefdinir, cefpodoxime, cefprozil, cefuroxime, cephalexin and loracarbef.     CEFTAZIDIME <=0.5 Sensitive      CEFEPIME <=0.12 Sensitive     CEFTRIAXONE <=0.25 Sensitive     CIPROFLOXACIN <=0.06 Sensitive     LEVOFLOXACIN <=0.12 Sensitive     GENTAMICIN <=1 Sensitive     IMIPENEM <=0.25 Sensitive     MEROPENEM <=0.25 Sensitive     NITROFURANTOIN <=16 Sensitive     PIP/TAZO <=4 Sensitive     TRIMETH/SULFA* <=20 Sensitive      * For infections other than uncomplicated UTI caused by E. coli, K. pneumoniae or P. mirabilis: Cefazolin  is resistant if MIC > or = 8 mcg/mL. (Distinguishing susceptible versus intermediate for isolates with MIC < or = 4 mcg/mL requires additional testing.) For uncomplicated UTI caused by E. coli, K. pneumoniae or P. mirabilis: Cefazolin  is susceptible if MIC <32 mcg/mL and predicts susceptible to the oral agents cefaclor, cefdinir, cefpodoxime, cefprozil, cefuroxime, cephalexin and loracarbef. Legend: S = Susceptible  I = Intermediate R = Resistant  NS = Not susceptible SDD = Susceptible Dose Dependent * = Not Tested  NR = Not Reported **NN = See Therapy Comments   Blood culture (routine x 2)     Status: None (Preliminary result)   Collection Time: 01/17/24  4:20 PM   Specimen: BLOOD  Result Value Ref Range Status   Specimen Description   Final    BLOOD LEFT ANTECUBITAL Performed at [facility], [address] [place], [place] [address]    Special Requests   Final    Blood Culture adequate volume BOTTLES DRAWN AEROBIC AND ANAEROBIC Performed at [facility], [address] [place], [place] [address]    Culture   Final    NO GROWTH 3 DAYS Performed at [facility], [address] [place], [place] [address]    Report Status PENDING  Incomplete  Blood culture (routine x 2)     Status: None (Preliminary result)   Collection Time: 01/17/24  4:25 PM   Specimen: BLOOD  Result Value Ref Range Status   Specimen Description   Final    BLOOD RIGHT ANTECUBITAL Performed at [facility], [address], [place], [place] [address]    Special Requests   Final    Blood Culture adequate volume BOTTLES DRAWN AEROBIC AND ANAEROBIC Performed at [facility], [address] [place], [place] [address]    Culture   Final    NO GROWTH 3 DAYS Performed at [facility], [address], [place], [place] [address]    Report Status PENDING  Incomplete  Urine Culture     Status: None   Collection Time: 01/17/24  4:42 PM   Specimen: Urine, Clean Catch  Result Value Ref Range Status   Specimen Description   Final    URINE, CLEAN CATCH Performed at [facility], [address], [place], [place] [address]    Special Requests   Final  NONE Performed at [facility], [address] [place], [place] [address]    Culture   Final    NO GROWTH Performed at [facility], [address], [place], [place] [address]    Report Status 01/18/2024 FINAL  Final  Resp panel by RT-PCR (RSV, Flu A&B, Covid) Anterior Nasal Swab     Status: None   Collection Time: 01/17/24  4:42 PM   Specimen: Anterior Nasal Swab  Result Value Ref Range Status   SARS Coronavirus 2 by RT PCR NEGATIVE NEGATIVE Final    Comment: (NOTE) SARS-CoV-2 target nucleic acids are NOT DETECTED.  The SARS-CoV-2 RNA is generally detectable in upper respiratory specimens during the acute phase of infection. The lowest concentration of SARS-CoV-2 viral copies this assay can detect is 138 copies/mL. A negative result does not preclude SARS-Cov-2 infection and should not be used as the sole basis for treatment or other patient management decisions. A negative result may occur with  improper specimen collection/handling, submission of specimen other than nasopharyngeal swab, presence of viral mutation(s) within the areas targeted by this assay, and inadequate number of viral copies(<138 copies/mL). A negative result must be combined with clinical  observations, patient history, and epidemiological information. The expected result is Negative.  Fact Sheet for Patients:  bloggercourse.com  Fact Sheet for Healthcare Providers:  seriousbroker.it  This test is no t yet approved or cleared by the [place]  FDA and  has been authorized for detection and/or diagnosis of SARS-CoV-2 by FDA under an Emergency Use Authorization (EUA). This EUA will remain  in effect (meaning this test can be used) for the duration of the COVID-19 declaration under Section 564(b)(1) of the Act, 21 U.S.C.section 360bbb-3(b)(1), unless the authorization is terminated  or revoked sooner.       Influenza A by PCR NEGATIVE NEGATIVE Final   Influenza B by PCR NEGATIVE NEGATIVE Final    Comment: (NOTE) The Xpert Xpress SARS-CoV-2/FLU/RSV plus assay is intended as an aid in the diagnosis of influenza from Nasopharyngeal swab specimens and should not be used as a sole basis for treatment. Nasal washings and aspirates are unacceptable for Xpert Xpress SARS-CoV-2/FLU/RSV testing.  Fact Sheet for Patients: bloggercourse.com  Fact Sheet for Healthcare Providers: seriousbroker.it  This test is not yet approved or cleared by the [place]  FDA and has been authorized for detection and/or diagnosis of SARS-CoV-2 by FDA under an Emergency Use Authorization (EUA). This EUA will remain in effect (meaning this test can be used) for the duration of the COVID-19 declaration under Section 564(b)(1) of the Act, 21 U.S.C. section 360bbb-3(b)(1), unless the authorization is terminated or revoked.     Resp Syncytial Virus by PCR NEGATIVE NEGATIVE Final    Comment: (NOTE) Fact Sheet for Patients: bloggercourse.com  Fact Sheet for Healthcare Providers: seriousbroker.it  This test is not yet approved or cleared by  the [place]  FDA and has been authorized for detection and/or diagnosis of SARS-CoV-2 by FDA under an Emergency Use Authorization (EUA). This EUA will remain in effect (meaning this test can be used) for the duration of the COVID-19 declaration under Section 564(b)(1) of the Act, 21 U.S.C. section 360bbb-3(b)(1), unless the authorization is terminated or revoked.  Performed at [facility], [address], [place], [place] [address]      Labs: BNP (last 3 results) No results for input(s): BNP in the last 8760 hours. Basic Metabolic Panel: Recent Labs  Lab 01/17/24 1632 01/18/24 1451 01/19/24 0302  NA 139 142 141  K 3.1* 3.8 3.9  CL 100 100 102  CO2 29 31 29   GLUCOSE 108* 93 93  BUN 17 10 12   CREATININE 1.05* 0.85 0.80  CALCIUM 9.0 9.6 9.3  MG  --  2.1  --   PHOS  --  2.8  --    Liver Function Tests: Recent Labs  Lab 01/17/24 1632 01/18/24 1451 01/19/24 0302  AST 29 28 22   ALT 20 22 15   ALKPHOS 133* 126 105  BILITOT 0.3 0.4 0.3  PROT 6.5 7.0 6.2*  ALBUMIN  3.8 3.9 3.5   Recent Labs  Lab 01/17/24 1632  LIPASE 21   No results for input(s): AMMONIA in the last 168 hours. CBC: Recent Labs  Lab 01/17/24 1632 01/18/24 1451 01/19/24 0302  WBC 5.0 6.0 5.7  NEUTROABS 3.4  --   --   HGB 11.2* 11.8* 10.6*  HCT 34.2* 37.5 32.1*  MCV 87.5 89.9 88.7  PLT 257 256 253   Cardiac Enzymes: No results for input(s): CKTOTAL, CKMB, CKMBINDEX, TROPONINI in the last 168 hours. BNP: Invalid input(s): POCBNP CBG: No results for input(s): GLUCAP in the last 168 hours. D-Dimer No results for input(s): DDIMER in the last 72 hours. Hgb A1c No results for input(s): HGBA1C in the last 72 hours. Lipid Profile No results for input(s): CHOL, HDL, LDLCALC, TRIG, CHOLHDL, LDLDIRECT in the last 72 hours. Thyroid  function studies No results for input(s): TSH, T4TOTAL, T3FREE, THYROIDAB in the last 72  hours.  Invalid input(s): FREET3 Anemia work up No results for input(s): VITAMINB12, FOLATE, FERRITIN, TIBC, IRON, RETICCTPCT in the last 72 hours. Urinalysis    Component Value Date/Time   COLORURINE YELLOW 01/17/2024 1642   APPEARANCEUR CLEAR 01/17/2024 1642   LABSPEC 1.019 01/17/2024 1642   PHURINE 6.0 01/17/2024 1642   GLUCOSEU NEGATIVE 01/17/2024 1642   HGBUR NEGATIVE 01/17/2024 1642   BILIRUBINUR NEGATIVE 01/17/2024 1642   BILIRUBINUR trace 01/15/2024 1128   KETONESUR NEGATIVE 01/17/2024 1642   PROTEINUR TRACE (A) 01/17/2024 1642   UROBILINOGEN 0.2 01/15/2024 1128   NITRITE NEGATIVE 01/17/2024 1642   LEUKOCYTESUR SMALL (A) 01/17/2024 1642   Sepsis Labs Recent Labs  Lab 01/17/24 1632 01/18/24 1451 01/19/24 0302  WBC 5.0 6.0 5.7   Microbiology Recent Results (from the past 240 hours)  Urine Culture     Status: Abnormal   Collection Time: 01/15/24  4:44 PM   Specimen: Urine  Result Value Ref Range Status   MICRO NUMBER: [id number]  Final   SPECIMEN QUALITY: Adequate  Final   Sample Source NOT GIVEN  Final   STATUS: FINAL  Final   ISOLATE 1: Escherichia coli (A)  Final    Comment: 50,000-99,000 CFU/mL of Escherichia coli      Susceptibility   Escherichia coli - URINE CULTURE, REFLEX    AMOX/CLAVULANIC 8 Sensitive     AMPICILLIN/SULBACTAM 16 Intermediate     CEFAZOLIN * <=1 Not Reportable      * For infections other than uncomplicated UTI caused by E. coli, K. pneumoniae or P. mirabilis: Cefazolin  is resistant if MIC > or = 8 mcg/mL. (Distinguishing susceptible versus intermediate for isolates with MIC < or = 4 mcg/mL requires additional testing.) For uncomplicated UTI caused by E. coli, K. pneumoniae or P. mirabilis: Cefazolin  is susceptible if MIC <32 mcg/mL and predicts susceptible to the oral agents cefaclor, cefdinir, cefpodoxime, cefprozil, cefuroxime, cephalexin and loracarbef.     CEFTAZIDIME <=0.5 Sensitive     CEFEPIME <=0.12  Sensitive     CEFTRIAXONE <=0.25 Sensitive     CIPROFLOXACIN <=0.06 Sensitive     LEVOFLOXACIN <=0.12 Sensitive     GENTAMICIN <=1 Sensitive     IMIPENEM <=0.25 Sensitive     MEROPENEM <=0.25 Sensitive     NITROFURANTOIN <=16 Sensitive     PIP/TAZO <=4 Sensitive     TRIMETH/SULFA* <=20 Sensitive      * For infections other than uncomplicated UTI caused by E. coli, K. pneumoniae or P. mirabilis: Cefazolin  is resistant if MIC > or = 8 mcg/mL. (Distinguishing susceptible versus intermediate for isolates with MIC < or = 4 mcg/mL requires additional testing.) For uncomplicated UTI caused by E. coli, K. pneumoniae or P. mirabilis: Cefazolin  is susceptible if MIC <32 mcg/mL and predicts susceptible to the oral agents cefaclor, cefdinir, cefpodoxime, cefprozil, cefuroxime, cephalexin and loracarbef. Legend: S = Susceptible  I = Intermediate R = Resistant  NS = Not susceptible SDD = Susceptible Dose Dependent * = Not Tested  NR = Not Reported **NN = See Therapy Comments   Blood culture (routine x 2)     Status: None (Preliminary result)   Collection Time: 01/17/24  4:20 PM   Specimen: BLOOD  Result Value Ref Range Status   Specimen Description   Final    BLOOD LEFT ANTECUBITAL Performed at [facility], [address], [place], [place] [address]    Special Requests   Final    Blood Culture adequate volume BOTTLES DRAWN AEROBIC AND ANAEROBIC Performed at [facility], [address] [place], [place] [address]    Culture   Final    NO GROWTH 3 DAYS Performed at [facility], [address], [place], [place] [address]    Report Status PENDING  Incomplete  Blood culture (routine x 2)     Status: None (Preliminary result)   Collection Time: 01/17/24  4:25 PM   Specimen: BLOOD  Result Value Ref Range Status   Specimen Description   Final    BLOOD RIGHT ANTECUBITAL Performed at [facility], [address]  [place], [place] [address]    Special Requests   Final    Blood Culture adequate volume BOTTLES DRAWN AEROBIC AND ANAEROBIC Performed at [facility], [address] [place], [place] [address]    Culture   Final    NO GROWTH 3 DAYS Performed at [facility], [address], [place], [place] [address]    Report Status PENDING  Incomplete  Urine Culture     Status: None   Collection Time: 01/17/24  4:42 PM   Specimen: Urine, Clean Catch  Result Value Ref Range Status   Specimen Description   Final    URINE, CLEAN CATCH Performed at [facility], [address] [place], [place] [address]    Special Requests   Final    NONE Performed at [facility], [address], [place], [place] [address]    Culture   Final    NO GROWTH Performed at [facility], [address] [place], [place] [address]    Report Status 01/18/2024 FINAL  Final  Resp panel by RT-PCR (RSV, Flu A&B, Covid) Anterior Nasal Swab     Status: None   Collection Time: 01/17/24  4:42 PM   Specimen: Anterior Nasal Swab  Result Value Ref Range Status   SARS Coronavirus 2 by RT PCR NEGATIVE NEGATIVE Final    Comment: (NOTE) SARS-CoV-2 target nucleic acids  are NOT DETECTED.  The SARS-CoV-2 RNA is generally detectable in upper respiratory specimens during the acute phase of infection. The lowest concentration of SARS-CoV-2 viral copies this assay can detect is 138 copies/mL. A negative result does not preclude SARS-Cov-2 infection and should not be used as the sole basis for treatment or other patient management decisions. A negative result may occur with  improper specimen collection/handling, submission of specimen other than nasopharyngeal swab, presence of viral mutation(s) within the areas targeted by this assay, and inadequate number of viral copies(<138 copies/mL). A negative result must be combined with clinical observations, patient history,  and epidemiological information. The expected result is Negative.  Fact Sheet for Patients:  bloggercourse.com  Fact Sheet for Healthcare Providers:  seriousbroker.it  This test is no t yet approved or cleared by the [place]  FDA and  has been authorized for detection and/or diagnosis of SARS-CoV-2 by FDA under an Emergency Use Authorization (EUA). This EUA will remain  in effect (meaning this test can be used) for the duration of the COVID-19 declaration under Section 564(b)(1) of the Act, 21 U.S.C.section 360bbb-3(b)(1), unless the authorization is terminated  or revoked sooner.       Influenza A by PCR NEGATIVE NEGATIVE Final   Influenza B by PCR NEGATIVE NEGATIVE Final    Comment: (NOTE) The Xpert Xpress SARS-CoV-2/FLU/RSV plus assay is intended as an aid in the diagnosis of influenza from Nasopharyngeal swab specimens and should not be used as a sole basis for treatment. Nasal washings and aspirates are unacceptable for Xpert Xpress SARS-CoV-2/FLU/RSV testing.  Fact Sheet for Patients: bloggercourse.com  Fact Sheet for Healthcare Providers: seriousbroker.it  This test is not yet approved or cleared by the [place]  FDA and has been authorized for detection and/or diagnosis of SARS-CoV-2 by FDA under an Emergency Use Authorization (EUA). This EUA will remain in effect (meaning this test can be used) for the duration of the COVID-19 declaration under Section 564(b)(1) of the Act, 21 U.S.C. section 360bbb-3(b)(1), unless the authorization is terminated or revoked.     Resp Syncytial Virus by PCR NEGATIVE NEGATIVE Final    Comment: (NOTE) Fact Sheet for Patients: bloggercourse.com  Fact Sheet for Healthcare Providers: seriousbroker.it  This test is not yet approved or cleared by the [place]  FDA and has  been authorized for detection and/or diagnosis of SARS-CoV-2 by FDA under an Emergency Use Authorization (EUA). This EUA will remain in effect (meaning this test can be used) for the duration of the COVID-19 declaration under Section 564(b)(1) of the Act, 21 U.S.C. section 360bbb-3(b)(1), unless the authorization is terminated or revoked.  Performed at [facility], [address], [place], [place] [address]      Time coordinating discharge: 35 minutes  SIGNED:   Sophie Mao, MD  [facility] 01/20/2024, 9:43 AM

## 2024-01-20 NOTE — Progress Notes (Cosign Needed)
 DC package printed and instructions reviewed with pt. Pt verbalizes understanding.

## 2024-01-20 NOTE — TOC Transition Note (Signed)
 Transition of Care [facility]) - Discharge Note   Patient Details  Name: Andrea Byrd MRN: [id number] Date of Birth: 28-Aug-1945  Transition of Care [facility]) CM/SW Contact:  Jon ONEIDA Anon, RN Phone Number: 01/20/2024, 9:40 AM   Clinical Narrative:    Pt to discharge home with home health services in place. Pt agreeable to the discharge plan. HH PT set up with [facility] and HH orders are in place. There are no further IP CM needs at this time.    Final next level of care: Home w Home Health Services Barriers to Discharge: Barriers Resolved   Patient Goals and CMS Choice Patient states their goals for this hospitalization and ongoing recovery are:: To return home with Home Health services CMS Medicare.gov Compare Post Acute Care list provided to:: Patient Choice offered to / list presented to : Patient [place] ownership interest in [facility].provided to:: Patient    Discharge Placement                       Discharge Plan and Services Additional resources added to the After Visit Summary for                  DME Arranged: N/A DME Agency: NA       HH Arranged: PT HH Agency: [facility] Date [facility] Agency Contacted: 01/20/24 Time HH Agency Contacted: [phone] Representative spoke with at [facility] Agency: Accepted through the HUB  Social Drivers of Health (SDOH) Interventions SDOH Screenings   Food Insecurity: No Food Insecurity (01/18/2024)  Housing: Low Risk  (01/18/2024)  Transportation Needs: No Transportation Needs (01/18/2024)  Utilities: Not At Risk (01/18/2024)  Alcohol Screen: Low Risk  (06/28/2022)  Depression (PHQ2-9): Low Risk  (11/01/2023)  Financial Resource Strain: Low Risk  (06/28/2022)  Physical Activity: Inactive (06/28/2022)  Social Connections: Moderately Integrated (01/18/2024)  Stress: No Stress Concern Present (06/28/2022)  Tobacco Use: Low Risk  (01/18/2024)     Readmission Risk Interventions    01/20/2024    9:38 AM   Readmission Risk Prevention Plan  Post Dischage Appt Complete  Medication Screening Complete  Transportation Screening Complete

## 2024-01-20 NOTE — Plan of Care (Signed)
   Problem: Activity: Goal: Risk for activity intolerance will decrease Outcome: Progressing   Problem: Pain Managment: Goal: General experience of comfort will improve and/or be controlled Outcome: Progressing   Problem: Safety: Goal: Ability to remain free from injury will improve Outcome: Progressing

## 2024-01-21 ENCOUNTER — Telehealth: Payer: Self-pay

## 2024-01-21 NOTE — Transitions of Care (Post Inpatient/ED Visit) (Signed)
 01/21/2024  Name: Andrea Byrd MRN: [id number] DOB: [birth year]  Today's TOC FU Call Status: Today's TOC FU Call Status:: Successful TOC FU Call Completed TOC FU Call Complete Date: 01/21/24  Patient's Name and Date of Birth confirmed. DOB, Name  Transition Care Management Follow-up Telephone Call Date of Discharge: 01/20/24 Discharge Facility: [facility]) Type of Discharge: Inpatient Admission Primary Inpatient Discharge Diagnosis:: Acute pyelonephritis How have you been since you were released from the hospital?: Same Any questions or concerns?: No  Items Reviewed: Did you receive and understand the discharge instructions provided?: Yes Medications obtained,verified, and reconciled?: Yes (Medications Reviewed) Any new allergies since your discharge?: No Dietary orders reviewed?: NA Do you have support at home?: Yes People in Home [RPT]: other relative(s)  Medications Reviewed Today: Medications Reviewed Today     Reviewed by Lavelle Charmaine NOVAK, LPN (Licensed Practical Nurse) on 01/21/24 at [phone]  Med List Status: <None>   Medication Order Taking? Sig Documenting Provider Last Dose Status Informant  ALEVE 220 MG tablet [id number] No Take 220 mg by mouth with breakfast, with lunch, and with evening meal. [provider] 01/16/2024 Active Self  cephALEXin (KEFLEX) 500 MG capsule [id number]  Take 1 capsule (500 mg total) by mouth 3 (three) times daily for 7 days. Cheryle Page, MD  Active   FLUoxetine  (PROZAC ) 20 MG capsule [id number] No TAKE 1 CAPSULE BY MOUTH EVERY DAY Tabori, Katherine E, MD 01/16/2024 Active Self  furosemide  (LASIX ) 20 MG tablet [id number]  Take 1 tablet (20 mg total) by mouth daily as needed (for swelling of the legs). Cheryle Page, MD  Active   HYDROcodone -acetaminophen  (NORCO/VICODIN) 5-325 MG tablet [id number]  Take 1-2 tablets by mouth every 6 (six) hours as needed for severe pain (pain score 7-10). Cheryle Page, MD  Active   latanoprost   (XALATAN ) 0.005 % ophthalmic solution [id number] No Place 1 drop into both eyes at bedtime.  [provider] 01/16/2024 Active Self           Med Note MARC, SOPHIA A   Mon Jan 17, 2016  1:43 PM)    losartan -hydrochlorothiazide  [facility]) 50-12.5 MG tablet [id number] No TAKE 1 TABLET BY MOUTH EVERY DAY Tabori, Katherine E, MD 01/16/2024 Active Self  omeprazole  (PRILOSEC) 20 MG capsule [id number] No TAKE 1 CAPSULE 2 TIMES DAILY 30-60 MINUTES BEFORE BREAKFAST AND DINNER.  Patient taking differently: Take 20 mg by mouth See admin instructions. Take 20 mg by mouth 30-60 minutes prior to breakfast and supper   Beather Delon Gibson, [place] 01/16/2024 Active Self  ondansetron  (ZOFRAN ) 4 MG tablet [id number] No Take 1 tablet (4 mg total) by mouth every 6 (six) hours as needed for nausea. Douglass Caul B, FNP Unknown Active Self  polyethylene glycol (MIRALAX  / GLYCOLAX ) 17 g packet [id number]  Take 17 g by mouth daily as needed for severe constipation. Cheryle Page, MD  Active   potassium chloride  SA (KLOR-CON  M) 20 MEQ tablet [id number] No TAKE 1 TABLET BY MOUTH EVERY DAY Tabori, Katherine E, MD 01/16/2024 Active Self  pramipexole  (MIRAPEX ) 1 MG tablet [id number] No Take 2 mg by mouth at bedtime. [provider] 01/16/2024 Active Self  scopolamine  (TRANSDERM-SCOP) 1 MG/3DAYS [id number]  Place 1 patch (1 mg total) onto the skin every 3 (three) days as needed (for vertigo). Cheryle Page, MD  Active   tiZANidine  (ZANAFLEX ) 4 MG tablet [id number]  Take 1-2 tablets (4-8 mg total) by mouth See admin instructions. Take 8 mg by mouth at bedtime and an additional  4 mg once a day as needed for spasms Cheryle Page, MD  Active             Home Care and Equipment/Supplies: Were Home Health Services Ordered?: Yes Name of Home Health Agency:: [facility] Any new equipment or medical supplies ordered?: NA  Functional Questionnaire: Do you need assistance with bathing/showering or dressing?: Yes Do you need  assistance with meal preparation?: No Do you need assistance with eating?: No Do you have difficulty maintaining continence: No Do you need assistance with getting out of bed/getting out of a chair/moving?: Yes Do you have difficulty managing or taking your medications?: No  Follow up appointments reviewed: PCP Follow-up appointment confirmed?: Yes Date of PCP follow-up appointment?: 01/28/24 Follow-up Provider: Dr. Mahlon Specialist [facility] Follow-up appointment confirmed?: NA Do you need transportation to your follow-up appointment?: No Do you understand care options if your condition(s) worsen?: Yes-patient verbalized understanding    SIGNATURE Charmaine Bloodgood, LPN [facility] Advisor [place] [facility] You Are. We Are. [facility] Direct Dial [phone]

## 2024-01-22 LAB — CULTURE, BLOOD (ROUTINE X 2)
Culture: NO GROWTH
Culture: NO GROWTH
Special Requests: ADEQUATE
Special Requests: ADEQUATE

## 2024-01-23 ENCOUNTER — Telehealth: Payer: Self-pay

## 2024-01-23 NOTE — Telephone Encounter (Signed)
 Copied from CRM [id number]. Topic: Clinical - Home Health Verbal Orders >> Jan 23, 2024 11:06 AM Thersia BROCKS wrote:  [facility] delayed start of care - physical therapy  Requesting to be seen 11/22 to start

## 2024-01-23 NOTE — Telephone Encounter (Signed)
 Noted and FYI PCP

## 2024-01-23 NOTE — Telephone Encounter (Signed)
Ok to delay

## 2024-01-28 ENCOUNTER — Ambulatory Visit: Admitting: Family Medicine

## 2024-01-28 ENCOUNTER — Encounter: Payer: Self-pay | Admitting: Family Medicine

## 2024-01-28 VITALS — BP 138/78 | HR 87 | Temp 98.0°F | Ht 63.0 in | Wt 196.5 lb

## 2024-01-28 DIAGNOSIS — N10 Acute tubulo-interstitial nephritis: Secondary | ICD-10-CM

## 2024-01-28 LAB — CBC WITH DIFFERENTIAL/PLATELET
Basophils Absolute: 0 K/uL (ref 0.0–0.1)
Basophils Relative: 0.7 % (ref 0.0–3.0)
Eosinophils Absolute: 0.1 K/uL (ref 0.0–0.7)
Eosinophils Relative: 0.9 % (ref 0.0–5.0)
HCT: 36.8 % (ref 36.0–46.0)
Hemoglobin: 12.2 g/dL (ref 12.0–15.0)
Lymphocytes Relative: 19.5 % (ref 12.0–46.0)
Lymphs Abs: 1.3 K/uL (ref 0.7–4.0)
MCHC: 33.2 g/dL (ref 30.0–36.0)
MCV: 85.9 fl (ref 78.0–100.0)
Monocytes Absolute: 0.4 K/uL (ref 0.1–1.0)
Monocytes Relative: 5.3 % (ref 3.0–12.0)
Neutro Abs: 5.1 K/uL (ref 1.4–7.7)
Neutrophils Relative %: 73.6 % (ref 43.0–77.0)
Platelets: 333 K/uL (ref 150.0–400.0)
RBC: 4.28 Mil/uL (ref 3.87–5.11)
RDW: 14.8 % (ref 11.5–15.5)
WBC: 6.9 K/uL (ref 4.0–10.5)

## 2024-01-28 LAB — BASIC METABOLIC PANEL WITH GFR
BUN: 12 mg/dL (ref 6–23)
CO2: 33 meq/L — ABNORMAL HIGH (ref 19–32)
Calcium: 9.3 mg/dL (ref 8.4–10.5)
Chloride: 98 meq/L (ref 96–112)
Creatinine, Ser: 0.73 mg/dL (ref 0.40–1.20)
GFR: 79.11 mL/min (ref >60.00)
Glucose, Bld: 87 mg/dL (ref 70–99)
Potassium: 4.1 meq/L (ref 3.5–5.1)
Sodium: 139 meq/L (ref 135–145)

## 2024-01-28 NOTE — Patient Instructions (Addendum)
 Follow up as needed or as scheduled We'll notify you of your lab results and make any changes if needed We'll call you to schedule your Urology appt Drink LOTS of fluids You can add OTC Cranberry supplement to help acidify your urine Consider starting a Probiotic to restore gut health after antibiotics (Align, Florastor, etc) Call with any questions or concerns Stay Safe!  Stay Healthy! Happy Thanksgiving!!

## 2024-01-28 NOTE — Telephone Encounter (Signed)
 Copied from CRM #[id number]. Topic: Clinical - Home Health Verbal Orders >> Jan 28, 2024  8:40 AM Rea BROCKS wrote: Caller/Agency: [facility] - Physical Therapist  Callback Number: [phone] Service Requested: Physical therapy  Frequency: 2 weeks 3 after assessment  Any new concerns about the patient? Did an assessment Saturday 11/22

## 2024-01-28 NOTE — Telephone Encounter (Signed)
 Verbal orders for physical therapy 2 weeks after 3 assessment   Okay to relay verbal orders?

## 2024-01-28 NOTE — Telephone Encounter (Signed)
 Ok for verbal orders ?

## 2024-01-28 NOTE — Progress Notes (Unsigned)
   Subjective:    Patient ID: Andrea Byrd, female    DOB: Nov 18, 1945, 78 y.o.   MRN: [id number]  HPI Hospital f/u- pt was admitted 11/13-16 w/ R sided pyelonephritis.  She was tx'd w/ IV Rocephin .  Switched to Keflex  for D/C.  D/c summary recommends repeat CBC and BMP at followup.  Today pt reports feeling better but not back to baseline.  Pain has improved.  No longer having nausea.  Not having pain w/ urination but continues to have R sided back pain.  Pt is eating and drinking but 'not as much as they want me to'.    Pt was told her during her hospitalization that she may have damaged her kidney and this is understandably upsetting to pt.   Review of Systems For ROS see HPI     Objective:   Physical Exam Vitals reviewed.  Constitutional:      General: She is not in acute distress.    Appearance: Normal appearance. She is not ill-appearing.  HENT:     Head: Normocephalic and atraumatic.  Cardiovascular:     Rate and Rhythm: Normal rate and regular rhythm.  Pulmonary:     Effort: Pulmonary effort is normal. No respiratory distress.     Breath sounds: No wheezing.  Abdominal:     General: There is no distension.     Palpations: Abdomen is soft.     Tenderness: There is no abdominal tenderness. There is right CVA tenderness. There is no left CVA tenderness, guarding or rebound.  Skin:    General: Skin is warm and dry.  Neurological:     General: No focal deficit present.     Mental Status: She is alert and oriented to person, place, and time.  Psychiatric:        Mood and Affect: Mood normal.        Behavior: Behavior normal.        Thought Content: Thought content normal.           Assessment & Plan:

## 2024-01-28 NOTE — Telephone Encounter (Signed)
 Spoke to [place] and provided the approval. No questions at this time

## 2024-01-29 ENCOUNTER — Ambulatory Visit: Payer: Self-pay | Admitting: Family Medicine

## 2024-01-29 NOTE — Assessment & Plan Note (Signed)
 Improving.  Pt is finishing her course of oral abx.  Pain is improving but still present.  Will do urine culture today for TOC.  She was told that the infxn may have damaged her kidneys and this is understandably upsetting for her.  Will refer to Urology for a complete evaluation.  Reviewed supportive care and red flags that should prompt return.  Pt expressed understanding and is in agreement w/ plan.

## 2024-01-29 NOTE — Progress Notes (Signed)
 Pt has been notified.

## 2024-01-30 LAB — URINE CULTURE
MICRO NUMBER:: 17275298
Result:: NO GROWTH
SPECIMEN QUALITY:: ADEQUATE

## 2024-01-30 NOTE — Progress Notes (Signed)
 Pt has been notified.

## 2024-02-04 ENCOUNTER — Telehealth: Payer: Self-pay | Admitting: Family Medicine

## 2024-02-04 NOTE — Telephone Encounter (Signed)
 Home Health Certification or Plan of Care Tracking  Is this a Certification or Plan of Care? Plan of Care  [facility] Agency: [facility]  Order Number:  [id number]  Has charge sheet been attached? Yes  Where has form been placed: Labeled & placed in provider bin

## 2024-02-05 NOTE — Telephone Encounter (Signed)
 Placed in sign folder at [facility]

## 2024-02-11 ENCOUNTER — Telehealth: Payer: Self-pay | Admitting: Family Medicine

## 2024-02-11 DIAGNOSIS — I700 Atherosclerosis of aorta: Secondary | ICD-10-CM | POA: Diagnosis not present

## 2024-02-11 DIAGNOSIS — K5730 Diverticulosis of large intestine without perforation or abscess without bleeding: Secondary | ICD-10-CM | POA: Diagnosis not present

## 2024-02-11 DIAGNOSIS — K219 Gastro-esophageal reflux disease without esophagitis: Secondary | ICD-10-CM | POA: Diagnosis not present

## 2024-02-11 DIAGNOSIS — I10 Essential (primary) hypertension: Secondary | ICD-10-CM | POA: Diagnosis not present

## 2024-02-11 DIAGNOSIS — N10 Acute tubulo-interstitial nephritis: Secondary | ICD-10-CM | POA: Diagnosis not present

## 2024-02-11 DIAGNOSIS — G2581 Restless legs syndrome: Secondary | ICD-10-CM | POA: Diagnosis not present

## 2024-02-11 DIAGNOSIS — K222 Esophageal obstruction: Secondary | ICD-10-CM | POA: Diagnosis not present

## 2024-02-11 DIAGNOSIS — D649 Anemia, unspecified: Secondary | ICD-10-CM | POA: Diagnosis not present

## 2024-02-11 DIAGNOSIS — K589 Irritable bowel syndrome without diarrhea: Secondary | ICD-10-CM | POA: Diagnosis not present

## 2024-02-11 NOTE — Telephone Encounter (Signed)
 Placed in sign folder at [facility]

## 2024-02-11 NOTE — Telephone Encounter (Signed)
 Home Health Certification or Plan of Care Tracking  Is this a Certification or Plan of Care? Plan of Care  [facility] Agency: [facility]  Order Number:  [id number]  Has charge sheet been attached? Yes  Where has form been placed: Labeled & placed in provider bin

## 2024-02-11 NOTE — Telephone Encounter (Signed)
Duplicate forms.  shredded

## 2024-02-11 NOTE — Telephone Encounter (Signed)
 Form completed and returned to [place]

## 2024-02-12 ENCOUNTER — Encounter: Payer: Self-pay | Admitting: Student in an Organized Health Care Education/Training Program

## 2024-02-12 ENCOUNTER — Other Ambulatory Visit: Payer: Self-pay | Admitting: Family Medicine

## 2024-02-12 ENCOUNTER — Ambulatory Visit: Admitting: Student in an Organized Health Care Education/Training Program

## 2024-02-12 VITALS — BP 158/78 | HR 82 | Wt 201.0 lb

## 2024-02-12 DIAGNOSIS — R059 Cough, unspecified: Secondary | ICD-10-CM

## 2024-02-12 DIAGNOSIS — J988 Other specified respiratory disorders: Secondary | ICD-10-CM | POA: Insufficient documentation

## 2024-02-12 LAB — POC COVID19 BINAXNOW: SARS Coronavirus 2 Ag: POSITIVE — AB

## 2024-02-12 LAB — POCT INFLUENZA A/B
Influenza A, POC: NEGATIVE
Influenza B, POC: NEGATIVE

## 2024-02-12 MED ORDER — NIRMATRELVIR/RITONAVIR (PAXLOVID)TABLET: 3.0000 | ORAL_TABLET | Freq: Two times a day (BID) | ORAL | 0 refills | Status: AC

## 2024-02-12 NOTE — Patient Instructions (Signed)
  VISIT SUMMARY: Today, you were seen for a severe cough and body aches that have been ongoing for two days. You also reported low-grade fevers, mild sinus discomfort, and a sore throat. A COVID-19 test was performed and came back positive.  YOUR PLAN: -COVID-19 INFECTION: You have been diagnosed with an acute COVID-19 infection, which is a viral illness that can cause symptoms like cough, body aches, fever, and sputum production. You have been prescribed Paxlovid  to help fight the virus  Monitor your symptoms closely and seek medical attention if you experience shortness of breath, significant weakness, or difficulty walking.  INSTRUCTIONS: Please monitor your symptoms closely and seek medical attention if you experience shortness of breath, significant weakness, or difficulty walking. Avoid driving and alcohol while taking the prescribed cough syrup. Follow up with your primary care provider if your symptoms do not improve or if you have any concerns.

## 2024-02-12 NOTE — Assessment & Plan Note (Signed)
 Acute COVID-19 confirmed by positive swab. Symptoms include cough, body aches, low-grade fever, and moderate sputum with blood. Monitor for severe symptoms. Prescribed Paxlovid  to lower her risk of severe infection.  She lives alone, I wish she had more support at home. Seek medical attention for shortness of breath, weakness, or inability to walk.  I considered codeine-based cough syrup given her significant tussive episodes, but she has allergies to both codeine and hydrocodone  listed.  So I recommended over-the-counter guaifenesin.  Given her advanced age, living alone, recent hospitalization, she is at risk for rehospitalization despite our best efforts.

## 2024-02-12 NOTE — Telephone Encounter (Signed)
 Faxed and placed in scan bin

## 2024-02-12 NOTE — Progress Notes (Signed)
 Acute Office Visit  Patient ID: Andrea Byrd, female    DOB: 16-Jan-1946, 78 y.o.   MRN: [id number]  PCP: Mahlon Comer [name], MD  Chief Complaint  Patient presents with   Cough    Coughing and low grade temp, patient does sound a little wheezy and SOB    Subjective:     HPI  Discussed the use of AI scribe software for clinical note transcription with the patient, who gave verbal consent to proceed.  History of Present Illness Andrea Byrd is a 78 year old female who presents with cough and body aches.  She has had a severe cough for two days, which is productive with moderate yellow to green sputum containing a small amount of blood. The cough causes significant pain, especially during coughing fits, and the patient reports that it hurts so badly she thinks she may have broken her ribs.  She has experienced low-grade fevers since the onset of symptoms on Sunday. There is mild sinus discomfort and drainage, contributing to a sore throat. No ear pain is present. She had one episode of vomiting today, which is unusual for her.  She lives alone and has not been in contact with anyone known to be sick. She has never smoked and maintains adequate nutrition and hydration. She was recently hospitalized for a urinary tract infection that progressed to pyelonephritis, from which she was discharged approximately three weeks ago.     Objective:    BP (!) 158/78   Pulse 82   Wt 201 lb (91.2 kg)   SpO2 97%   BMI 35.61 kg/m   Physical Exam  Gen: Tired appearing older woman Ears: Moderate erythema in both tympanic membranes, no middle ear effusion Mouth: Mild posterior oropharynx erythema Neck: No tender cervical adenopathy Heart: Distant sounds, regular, small anterior wheezing Lungs: Unlabored, clear posteriorly with no crackles or wheezing   Results for orders placed or performed in visit on 02/12/24  POC COVID-19  Result Value Ref Range   SARS Coronavirus 2 Ag Positive (A)  Negative  POCT Influenza A/B  Result Value Ref Range   Influenza A, POC Negative Negative   Influenza B, POC Negative Negative       Assessment & Plan:   Problem List Items Addressed This Visit       Unprioritized   Respiratory tract infection due to COVID-19 virus - Primary   Acute COVID-19 confirmed by positive swab. Symptoms include cough, body aches, low-grade fever, and moderate sputum with blood. Monitor for severe symptoms. Prescribed Paxlovid  to lower her risk of severe infection.  She lives alone, I wish she had more support at home. Seek medical attention for shortness of breath, weakness, or inability to walk.  I considered codeine-based cough syrup given her significant tussive episodes, but she has allergies to both codeine and hydrocodone  listed.  So I recommended over-the-counter guaifenesin.  Given her advanced age, living alone, recent hospitalization, she is at risk for rehospitalization despite our best efforts.      Relevant Medications   nirmatrelvir /ritonavir  (PAXLOVID ) 20 x 150 MG & 10 x 100MG  TABS   Other Visit Diagnoses       Cough, unspecified type       Relevant Orders   POC COVID-19 (Completed)   POCT Influenza A/B (Completed)       Meds ordered this encounter  Medications   nirmatrelvir /ritonavir  (PAXLOVID ) 20 x 150 MG & 10 x 100MG  TABS    Sig: Take 3 tablets  by mouth 2 (two) times daily for 5 days. (Take nirmatrelvir  150 mg two tablets twice daily for 5 days and ritonavir  100 mg one tablet twice daily for 5 days) Patient GFR is 79    Dispense:  30 tablet    Refill:  0    Return if symptoms worsen or fail to improve.  Cleatus Debby Specking, MD [facility]

## 2024-02-15 ENCOUNTER — Telehealth: Payer: Self-pay | Admitting: Family Medicine

## 2024-02-15 NOTE — Telephone Encounter (Signed)
 Placed in provider folder at nurse's station

## 2024-02-15 NOTE — Telephone Encounter (Addendum)
 Home Health Certification or Plan of Care Tracking  Is this a Certification or Plan of Care? Plan of Care  [facility] Agency: [facility]  Order Number:  [id number], [id number]  Has charge sheet been attached? Yes  Where has form been placed:   Labeled & placed in provider bin

## 2024-02-15 NOTE — Telephone Encounter (Signed)
Forms have been faxed back and placed in scan folder.

## 2024-02-19 ENCOUNTER — Ambulatory Visit

## 2024-02-19 VITALS — Ht 62.0 in | Wt 201.0 lb

## 2024-02-19 DIAGNOSIS — Z780 Asymptomatic menopausal state: Secondary | ICD-10-CM

## 2024-02-19 DIAGNOSIS — Z0000 Encounter for general adult medical examination without abnormal findings: Secondary | ICD-10-CM

## 2024-02-19 NOTE — Progress Notes (Signed)
 Chief Complaint  Patient presents with   Medicare Wellness     Subjective:   Andrea Byrd is a 78 y.o. female who presents for a Medicare Annual Wellness Visit.  No voiced or noted concerns at this time Patient advised to keep follow-up appointment with PCP (04-08-2023         Patient is just getting over COVID)   I discussed the limitations of evaluation and management by telemedicine. The patient expressed understanding and agreed to proceed.   Vital Signs: Because this visit was a virtual/telehealth visit, some criteria may be missing or patient reported. Any vitals not documented were not able to be obtained and vitals that have been documented are patient reported.       Visit info / Clinical Intake: Medicare Wellness Visit Type:: Subsequent Annual Wellness Visit Persons participating in visit and providing information:: patient Medicare Wellness Visit Mode:: Telephone If telephone:: video declined Since this visit was completed virtually, some vitals may be partially provided or unavailable. Missing vitals are due to the limitations of the virtual format.: Unable to obtain vitals - no equipment If Telephone or Video please confirm:: I connected with patient using audio/video enable telemedicine. I verified patient identity with two identifiers, discussed telehealth limitations, and patient agreed to proceed. Patient Location:: home Provider Location:: home Interpreter Needed?: No Pre-visit prep was completed: no AWV questionnaire completed by patient prior to visit?: no Living arrangements:: (!) lives alone Patient's Overall Health Status Rating: good Typical amount of pain: none Does pain affect daily life?: no Are you currently prescribed opioids?: no  Dietary Habits and Nutritional Risks How many meals a day?: 2 Eats fruit and vegetables daily?: yes Most meals are obtained by: preparing own meals; eating out In the last 2 weeks, have you had any of the following?:  none Diabetic:: no  Functional Status Activities of Daily Living (to include ambulation/medication): Independent Ambulation: Independent Medication Administration: Independent Home Management (perform basic housework or laundry): Independent Manage your own finances?: yes Primary transportation is: driving Concerns about hearing?: no  Fall Screening Falls in the past year?: 0 Number of falls in past year: 0 Was there an injury with Fall?: 0 Fall Risk Category Calculator: 0 Patient Fall Risk Level: Low Fall Risk  Fall Risk Patient at Risk for Falls Due to: No Fall Risks Fall risk Follow up: Falls evaluation completed; Education provided; Falls prevention discussed  Home and Transportation Safety: All rugs have non-skid backing?: (!) no All stairs or steps have railings?: (!) no Grab bars in the bathtub or shower?: yes Have non-skid surface in bathtub or shower?: (!) no Good home lighting?: yes Regular seat belt use?: yes Hospital stays in the last year:: no  Cognitive Assessment Difficulty concentrating, remembering, or making decisions? : no Will 6CIT or Mini Cog be Completed: yes What year is it?: 0 points What month is it?: 0 points Give patient an address phrase to remember (5 components): Its very sunny outside today in December About what time is it?: 0 points Count backwards from 20 to 1: 0 points Say the months of the year in reverse: 0 points Repeat the address phrase from earlier: 0 points 6 CIT Score: 0 points  Advance Directives (For Healthcare) Does Patient Have a Medical Advance Directive?: Yes Does patient want to make changes to medical advance directive?: No - Patient declined Type of Advance Directive: Healthcare Power of Attorney Copy of Healthcare Power of Attorney in Chart?: No - copy requested Would patient  like information on creating a medical advance directive?: No - Patient declined  Reviewed/Updated  Reviewed/Updated: Reviewed All  (Medical, Surgical, Family, Medications, Allergies, Care Teams, Patient Goals); Surgical History    Allergies (verified) Nitrofurantoin, Propoxyphene n-acetaminophen , Nortriptyline  hcl, Hydrocodone  bit-homatrop mbr, and Morphine    Current Medications (verified) Outpatient Encounter Medications as of 02/19/2024  Medication Sig   FLUoxetine  (PROZAC ) 20 MG capsule TAKE 1 CAPSULE BY MOUTH EVERY DAY   furosemide  (LASIX ) 20 MG tablet Take 1 tablet (20 mg total) by mouth daily as needed (for swelling of the legs).   HYDROcodone -acetaminophen  (NORCO/VICODIN) 5-325 MG tablet Take 1-2 tablets by mouth every 6 (six) hours as needed for severe pain (pain score 7-10).   latanoprost  (XALATAN ) 0.005 % ophthalmic solution Place 1 drop into both eyes at bedtime.    losartan -hydrochlorothiazide  (HYZAAR) 50-12.5 MG tablet TAKE 1 TABLET BY MOUTH EVERY DAY   omeprazole  (PRILOSEC) 20 MG capsule TAKE 1 CAPSULE 2 TIMES DAILY 30-60 MINUTES BEFORE BREAKFAST AND DINNER.   ondansetron  (ZOFRAN ) 4 MG tablet Take 1 tablet (4 mg total) by mouth every 6 (six) hours as needed for nausea.   polyethylene glycol (MIRALAX  / GLYCOLAX ) 17 g packet Take 17 g by mouth daily as needed for severe constipation.   potassium chloride  SA (KLOR-CON  M) 20 MEQ tablet TAKE 1 TABLET BY MOUTH EVERY DAY   pramipexole  (MIRAPEX ) 1 MG tablet Take 2 mg by mouth at bedtime.   tiZANidine  (ZANAFLEX ) 4 MG tablet Take 1-2 tablets (4-8 mg total) by mouth See admin instructions. Take 8 mg by mouth at bedtime and an additional 4 mg once a day as needed for spasms   No facility-administered encounter medications on file as of 02/19/2024.    History: Past Medical History:  Diagnosis Date   Anemia    Arthritis    Cervical cancer (HCC) 1977   Diverticulosis    Dry eyes    Endometriosis    Esophageal stricture    Gastritis    GERD (gastroesophageal reflux disease)    Glaucoma    Headache    Herpes simplex type 1 infection    Hiatal hernia     History of colon polyps    History of gallstones    Hypertension    IBS (irritable bowel syndrome)    Insomnia    Post-operative nausea and vomiting    Restless leg syndrome    Vitamin D  deficiency 02/18/2021   Past Surgical History:  Procedure Laterality Date   ABDOMINAL HYSTERECTOMY  1998   APPENDECTOMY  1998   BUNIONECTOMY WITH HAMMERTOE RECONSTRUCTION Right 11/03/2021   Procedure: Right bunion correction with double osteotomy; Second Metatarsal weil; Second hammertoe correction; third through fifth tendon lengthening; excision of bunionette;  Surgeon: Kit Rush, MD;  Location: [facility];  Service: Orthopedics;  Laterality: Right;   CERVICAL CONE BIOPSY     CHOLECYSTECTOMY  1980   COLON RESECTION  2002   COLON SURGERY  2003   removed 2 feet colon   ESOPHAGEAL MANOMETRY N/A 04/24/2016   Procedure: ESOPHAGEAL MANOMETRY (EM);  Surgeon: Gustav Shila GAILS, MD;  Location: [facility];  Service: Endoscopy;  Laterality: N/A;   HAND SURGERY     right/ thumb joints replaced both hands   OOPHORECTOMY     right foot surgery Right 08/15/2022   SHOULDER ARTHROSCOPY  02/28/2022   TOTAL HIP ARTHROPLASTY Right 07/02/2023   Procedure: ARTHROPLASTY, HIP, TOTAL, ANTERIOR APPROACH;  Surgeon: Melodi Lerner, MD;  Location: [facility];  Service: Orthopedics;  Laterality: Right;   TOTAL SHOULDER REPLACEMENT Right 06/19/2022   TUBAL LIGATION     Family History  Problem Relation Age of Onset   Diabetes Mother    Stroke Mother        brother, sister, MGM   Colon polyps Mother    Heart disease Father    Kidney disease Father    Colon polyps Father    Lung disease Father    Kidney cancer Sister    Stroke Sister    Diabetes Sister    Colon polyps Sister    Heart attack Paternal Grandfather    Celiac disease Other        niece   Colon cancer Neg Hx    Esophageal cancer Neg Hx    Rectal cancer Neg Hx    Stomach cancer Neg Hx    Restless legs syndrome Neg Hx    Neuropathy Neg  Hx    Social History   Occupational History   Occupation: Retired  Tobacco Use   Smoking status: Never   Smokeless tobacco: Never  Vaping Use   Vaping status: Never Used  Substance and Sexual Activity   Alcohol use: No   Drug use: No   Sexual activity: Not Currently   Tobacco Counseling Counseling given: Not Answered  SDOH Screenings   Food Insecurity: No Food Insecurity (02/19/2024)  Housing: Unknown (02/19/2024)  Transportation Needs: No Transportation Needs (02/19/2024)  Utilities: Not At Risk (02/19/2024)  Alcohol Screen: Low Risk (06/28/2022)  Depression (PHQ2-9): Low Risk (02/19/2024)  Financial Resource Strain: Low Risk (06/28/2022)  Physical Activity: Inactive (02/19/2024)  Social Connections: Moderately Integrated (02/19/2024)  Stress: No Stress Concern Present (02/19/2024)  Tobacco Use: Low Risk (02/12/2024)  Health Literacy: Adequate Health Literacy (02/19/2024)   See flowsheets for full screening details  Depression Screen PHQ 2 & 9 Depression Scale- Over the past 2 weeks, how often have you been bothered by any of the following problems? Little interest or pleasure in doing things: 0 Feeling down, depressed, or hopeless (PHQ Adolescent also includes...irritable): 0 PHQ-2 Total Score: 0 Trouble falling or staying asleep, or sleeping too much: 2 Feeling tired or having little energy: 2 Poor appetite or overeating (PHQ Adolescent also includes...weight loss): 0 Feeling bad about yourself - or that you are a failure or have let yourself or your family down: 0 Trouble concentrating on things, such as reading the newspaper or watching television (PHQ Adolescent also includes...like school work): 0 Moving or speaking so slowly that other people could have noticed. Or the opposite - being so fidgety or restless that you have been moving around a lot more than usual: 0 Thoughts that you would be better off dead, or of hurting yourself in some way: 0 PHQ-9 Total Score:  4 If you checked off any problems, how difficult have these problems made it for you to do your work, take care of things at home, or get along with other people?: Not difficult at all     Goals Addressed             This Visit's Progress    Increase physical activity               Objective:    Today's Vitals   02/19/24 1433  Weight: 201 lb (91.2 kg)  Height: 5' 2 (1.575 m)   Body mass index is 36.76 kg/m.  Hearing/Vision screen Hearing Screening - Comments:: No trouble hearing Vision Screening - Comments:: [place] Up to date Immunizations  and Health Maintenance Health Maintenance  Topic Date Due   COVID-19 Vaccine (4 - 2025-26 season) 11/05/2023   Zoster Vaccines- Shingrix (2 of 2) 01/13/2024   Medicare Annual Wellness (AWV)  02/18/2025   DTaP/Tdap/Td (3 - Td or Tdap) 12/06/2032   Pneumococcal Vaccine: 50+ Years  Completed   Influenza Vaccine  Completed   Bone Density Scan  Completed   Hepatitis C Screening  Completed   Meningococcal B Vaccine  Aged Out   Mammogram  Discontinued   Colonoscopy  Discontinued        Assessment/Plan:  This is a routine wellness examination for Andrea Byrd.  Patient Care Team: Mahlon Comer [name], MD as PCP - General (Family Medicine) Nandigam, Kavitha V, MD as Consulting Physician (Gastroenterology) Margaret Eduard SAUNDERS, MD as Consulting Physician (Neurology) Lynnell Nottingham, MD as Consulting Physician (Dermatology) [facility], [facility] (Specialist) Nicholaus Sherlean CROME, [facility] (Inactive) (Pharmacist) [place], [facility]  I have personally reviewed and noted the following in the patients chart:   Medical and social history Use of alcohol, tobacco or illicit drugs  Current medications and supplements including opioid prescriptions. Functional ability and status Nutritional status Physical activity Advanced directives List of other physicians Hospitalizations, surgeries, and ER visits in previous 12  months Vitals Screenings to include cognitive, depression, and falls Referrals and appointments  Orders Placed This Encounter  Procedures   DG Bone Density    Reason for Exam (SYMPTOM  OR DIAGNOSIS REQUIRED):   screening for osteoporosis    Preferred imaging location?:   [facility]   In addition, I have reviewed and discussed with patient certain preventive protocols, quality metrics, and best practice recommendations. A written personalized care plan for preventive services as well as general preventive health recommendations were provided to patient.   Mliss Graff, LPN   [id number]   Return in 1 year (on 02/18/2025).  After Visit Summary: (MyChart) Due to this being a telephonic visit, the after visit summary with patients personalized plan was offered to patient via MyChart   Nurse Notes:

## 2024-02-19 NOTE — Patient Instructions (Signed)
 [name],  Thank you for taking the time for your Medicare Wellness Visit. I appreciate your continued commitment to your health goals. Please review the care plan we discussed, and feel free to reach out if I can assist you further.  Please note that Annual Wellness Visits do not include a physical exam. Some assessments may be limited, especially if the visit was conducted virtually. If needed, we may recommend an in-person follow-up with your provider.  Ongoing Care Seeing your primary care provider every 3 to 6 months helps us  monitor your health and provide consistent, personalized care.   Referrals If a referral was made during today's visit and you haven't received any updates within two weeks, please contact the referred provider directly to check on the status.  Recommended Screenings:  Health Maintenance  Topic Date Due   COVID-19 Vaccine (4 - 2025-26 season) 11/05/2023   Zoster (Shingles) Vaccine (2 of 2) 01/13/2024   Medicare Annual Wellness Visit  02/18/2025   DTaP/Tdap/Td vaccine (3 - Td or Tdap) 12/06/2032   Pneumococcal Vaccine for age over 107  Completed   Flu Shot  Completed   Osteoporosis screening with Bone Density Scan  Completed   Hepatitis C Screening  Completed   Meningitis B Vaccine  Aged Out   Breast Cancer Screening  Discontinued   Colon Cancer Screening  Discontinued       02/19/2024    2:34 PM  Advanced Directives  Does Patient Have a Medical Advance Directive? Yes  Type of Advance Directive Healthcare Power of Attorney  Does patient want to make changes to medical advance directive? No - Patient declined  Copy of Healthcare Power of Attorney in Chart? No - copy requested    Vision: Annual vision screenings are recommended for early detection of glaucoma, cataracts, and diabetic retinopathy. These exams can also reveal signs of chronic conditions such as diabetes and high blood pressure.  Dental: Annual dental screenings help detect early signs of  oral cancer, gum disease, and other conditions linked to overall health, including heart disease and diabetes.  Please see the attached documents for additional preventive care recommendations. [name] , Thank you for taking time to come for your Medicare Wellness Visit. I appreciate your ongoing commitment to your health goals. Please review the following plan we discussed and let me know if I can assist you in the future.   Screening recommendations/referrals: Colonoscopy:  Mammogram:  Bone Density:  Recommended yearly ophthalmology/optometry visit for glaucoma screening and checkup Recommended yearly dental visit for hygiene and checkup  Vaccinations: Influenza vaccine:  Pneumococcal vaccine:  Tdap vaccine:  Shingles vaccine:        Preventive Care 65 Years and Older, Female Preventive care refers to lifestyle choices and visits with your health care provider that can promote health and wellness. What does preventive care include? A yearly physical exam. This is also called an annual well check. Dental exams once or twice a year. Routine eye exams. Ask your health care provider how often you should have your eyes checked. Personal lifestyle choices, including: Daily care of your teeth and gums. Regular physical activity. Eating a healthy diet. Avoiding tobacco and drug use. Limiting alcohol use. Practicing safe sex. Taking low-dose aspirin  every day. Taking vitamin and mineral supplements as recommended by your health care provider. What happens during an annual well check? The services and screenings done by your health care provider during your annual well check will depend on your age, overall health, lifestyle risk  factors, and family history of disease. Counseling  Your health care provider may ask you questions about your: Alcohol use. Tobacco use. Drug use. Emotional well-being. Home and relationship well-being. Sexual activity. Eating habits. History of  falls. Memory and ability to understand (cognition). Work and work astronomer. Reproductive health. Screening  You may have the following tests or measurements: Height, weight, and BMI. Blood pressure. Lipid and cholesterol levels. These may be checked every 5 years, or more frequently if you are over 64 years old. Skin check. Lung cancer screening. You may have this screening every year starting at age 60 if you have a 30-pack-year history of smoking and currently smoke or have quit within the past 15 years. Fecal occult blood test (FOBT) of the stool. You may have this test every year starting at age 59. Flexible sigmoidoscopy or colonoscopy. You may have a sigmoidoscopy every 5 years or a colonoscopy every 10 years starting at age 2. Hepatitis C blood test. Hepatitis B blood test. Sexually transmitted disease (STD) testing. Diabetes screening. This is done by checking your blood sugar (glucose) after you have not eaten for a while (fasting). You may have this done every 1-3 years. Bone density scan. This is done to screen for osteoporosis. You may have this done starting at age 74. Mammogram. This may be done every 1-2 years. Talk to your health care provider about how often you should have regular mammograms. Talk with your health care provider about your test results, treatment options, and if necessary, the need for more tests. Vaccines  Your health care provider may recommend certain vaccines, such as: Influenza vaccine. This is recommended every year. Tetanus, diphtheria, and acellular pertussis (Tdap, Td) vaccine. You may need a Td booster every 10 years. Zoster vaccine. You may need this after age 34. Pneumococcal 13-valent conjugate (PCV13) vaccine. One dose is recommended after age 70. Pneumococcal polysaccharide (PPSV23) vaccine. One dose is recommended after age 31. Talk to your health care provider about which screenings and vaccines you need and how often you need  them. This information is not intended to replace advice given to you by your health care provider. Make sure you discuss any questions you have with your health care provider. Document Released: 03/19/2015 Document Revised: 11/10/2015 Document Reviewed: 12/22/2014 Elsevier Interactive Patient Education  2017 [employer].  Fall Prevention in the Home Falls can cause injuries. They can happen to people of all ages. There are many things you can do to make your home safe and to help prevent falls. What can I do on the outside of my home? Regularly fix the edges of walkways and driveways and fix any cracks. Remove anything that might make you trip as you walk through a door, such as a raised step or threshold. Trim any bushes or trees on the path to your home. Use bright outdoor lighting. Clear any walking paths of anything that might make someone trip, such as rocks or tools. Regularly check to see if handrails are loose or broken. Make sure that both sides of any steps have handrails. Any raised decks and porches should have guardrails on the edges. Have any leaves, snow, or ice cleared regularly. Use sand or salt on walking paths during winter. Clean up any spills in your garage right away. This includes oil or grease spills. What can I do in the bathroom? Use night lights. Install grab bars by the toilet and in the tub and shower. Do not use towel bars as grab bars.  Use non-skid mats or decals in the tub or shower. If you need to sit down in the shower, use a plastic, non-slip stool. Keep the floor dry. Clean up any water  that spills on the floor as soon as it happens. Remove soap buildup in the tub or shower regularly. Attach bath mats securely with double-sided non-slip rug tape. Do not have throw rugs and other things on the floor that can make you trip. What can I do in the bedroom? Use night lights. Make sure that you have a light by your bed that is easy to reach. Do not use  any sheets or blankets that are too big for your bed. They should not hang down onto the floor. Have a firm chair that has side arms. You can use this for support while you get dressed. Do not have throw rugs and other things on the floor that can make you trip. What can I do in the kitchen? Clean up any spills right away. Avoid walking on wet floors. Keep items that you use a lot in easy-to-reach places. If you need to reach something above you, use a strong step stool that has a grab bar. Keep electrical cords out of the way. Do not use floor polish or wax that makes floors slippery. If you must use wax, use non-skid floor wax. Do not have throw rugs and other things on the floor that can make you trip. What can I do with my stairs? Do not leave any items on the stairs. Make sure that there are handrails on both sides of the stairs and use them. Fix handrails that are broken or loose. Make sure that handrails are as long as the stairways. Check any carpeting to make sure that it is firmly attached to the stairs. Fix any carpet that is loose or worn. Avoid having throw rugs at the top or bottom of the stairs. If you do have throw rugs, attach them to the floor with carpet tape. Make sure that you have a light switch at the top of the stairs and the bottom of the stairs. If you do not have them, ask someone to add them for you. What else can I do to help prevent falls? Wear shoes that: Do not have high heels. Have rubber bottoms. Are comfortable and fit you well. Are closed at the toe. Do not wear sandals. If you use a stepladder: Make sure that it is fully opened. Do not climb a closed stepladder. Make sure that both sides of the stepladder are locked into place. Ask someone to hold it for you, if possible. Clearly mark and make sure that you can see: Any grab bars or handrails. First and last steps. Where the edge of each step is. Use tools that help you move around (mobility aids)  if they are needed. These include: Canes. Walkers. Scooters. Crutches. Turn on the lights when you go into a dark area. Replace any light bulbs as soon as they burn out. Set up your furniture so you have a clear path. Avoid moving your furniture around. If any of your floors are uneven, fix them. If there are any pets around you, be aware of where they are. Review your medicines with your doctor. Some medicines can make you feel dizzy. This can increase your chance of falling. Ask your doctor what other things that you can do to help prevent falls. This information is not intended to replace advice given to you by your health  care provider. Make sure you discuss any questions you have with your health care provider. Document Released: 12/17/2008 Document Revised: 07/29/2015 Document Reviewed: 03/27/2014 Elsevier Interactive Patient Education  2017 [employer].

## 2024-02-25 ENCOUNTER — Telehealth: Payer: Self-pay | Admitting: Family Medicine

## 2024-02-25 NOTE — Telephone Encounter (Signed)
 Type of form received: [facility]  Additional comments: to be reviewed  Received by: Fax  Form should be Faxed/mailed to: (address/ fax #)  Is patient requesting call for pickup:  Form placed:  in patient cart  Attach charge sheet.  Provider will determine charge.  Individual made aware of 3-5 business day turn around Yes?

## 2024-02-26 NOTE — Telephone Encounter (Signed)
 Forms have been printed for review and placed in sign folder at nurses station   These forms are URGENT for REVIEW from HOME HEALTH

## 2024-02-29 NOTE — Telephone Encounter (Signed)
 Reviewed. No action needed.

## 2024-03-07 ENCOUNTER — Encounter: Admitting: Family Medicine

## 2024-03-13 ENCOUNTER — Encounter: Payer: Self-pay | Admitting: Urology

## 2024-03-13 ENCOUNTER — Ambulatory Visit: Admitting: Urology

## 2024-03-13 VITALS — BP 176/89 | HR 78 | Ht 62.0 in | Wt 195.0 lb

## 2024-03-13 DIAGNOSIS — N3946 Mixed incontinence: Secondary | ICD-10-CM | POA: Insufficient documentation

## 2024-03-13 DIAGNOSIS — Z87448 Personal history of other diseases of urinary system: Secondary | ICD-10-CM | POA: Insufficient documentation

## 2024-03-13 DIAGNOSIS — R8290 Unspecified abnormal findings in urine: Secondary | ICD-10-CM | POA: Diagnosis not present

## 2024-03-13 LAB — MICROSCOPIC EXAMINATION

## 2024-03-13 LAB — URINALYSIS, ROUTINE W REFLEX MICROSCOPIC
Bilirubin, UA: NEGATIVE
Glucose, UA: NEGATIVE
Ketones, UA: NEGATIVE
Nitrite, UA: NEGATIVE
Protein,UA: NEGATIVE
Specific Gravity, UA: 1.02 (ref 1.005–1.030)
Urobilinogen, Ur: 0.2 mg/dL (ref 0.2–1.0)
pH, UA: 5.5 (ref 5.0–7.5)

## 2024-03-13 LAB — BLADDER SCAN AMB NON-IMAGING

## 2024-03-13 NOTE — Progress Notes (Signed)
 "  Assessment: 1. History of pyelonephritis   2. Mixed incontinence   3. Abnormal urine findings      Plan: I personally reviewed the patient's chart including provider notes, lab and imaging results. I reviewed the patient's records from [facility]. Methods to reduce the risk of UTIs discussed with the patient including timed and double voiding, daily cranberry supplement, daily probiotic.  Given her prior history of cervical cancer, I would not recommend vaginal hormone replacement. Will send urine for culture since she was asymptomatic with her last infection. Will contact her with results and to arrange follow-up.  Chief Complaint:  Chief Complaint  Patient presents with   Pyelonephritis    History of Present Illness:  Andrea Byrd is a 79 y.o. female who is seen in consultation from Mahlon Comer [name], MD for evaluation of recent episode of right pyelonephritis. She was admitted to the hospital in November 2025 with low back pain, urinary frequency and vomiting.  No dysuria or gross hematuria.  She had a low grade fever, no chills. WBC 5.0K. Urine culture on admission grew 50-100 K E. coli.  He was treated with IV Rocephin  and discharged on cephalexin . Follow-up urine culture from 01/28/2024 showed no growth. CT imaging from 01/17/2024 showed patchy cortical hypoenhancement of the right kidney, mild urothelial enhancement of the right renal pelvis, no renal or ureteral calculi, no evidence of mass or obstruction. Creatinine was slightly elevated to 1.05 on 01/17/2024. Recent creatinine from 01/28/2024 was normal at 0.73.  No history of frequent UTIs.  No history of kidney stones. She does report symptoms of frequency, urgency, nocturia x 2, stress and urge incontinence. No dysuria or gross hematuria.  She has a history of cervical cancer treated with total abdominal hysterectomy and salpingo-oophorectomy approximately 50 years ago. She underwent removal of a foreign  body from the bladder by Dr. Nicholaus in 2004. Cystoscopy in 2015 showed no obvious foreign bodies.  Past Medical History:  Past Medical History:  Diagnosis Date   Anemia    Arthritis    Cervical cancer (HCC) 1977   Diverticulosis    Dry eyes    Endometriosis    Esophageal stricture    Gastritis    GERD (gastroesophageal reflux disease)    Glaucoma    Headache    Herpes simplex type 1 infection    Hiatal hernia    History of colon polyps    History of gallstones    Hypertension    IBS (irritable bowel syndrome)    Insomnia    Post-operative nausea and vomiting    Restless leg syndrome    Vitamin D  deficiency 02/18/2021    Past Surgical History:  Past Surgical History:  Procedure Laterality Date   ABDOMINAL HYSTERECTOMY  1998   APPENDECTOMY  1998   BUNIONECTOMY WITH HAMMERTOE RECONSTRUCTION Right 11/03/2021   Procedure: Right bunion correction with double osteotomy; Second Metatarsal weil; Second hammertoe correction; third through fifth tendon lengthening; excision of bunionette;  Surgeon: Kit Rush, MD;  Location: [facility];  Service: Orthopedics;  Laterality: Right;   CERVICAL CONE BIOPSY     CHOLECYSTECTOMY  1980   COLON RESECTION  2002   COLON SURGERY  2003   removed 2 feet colon   ESOPHAGEAL MANOMETRY N/A 04/24/2016   Procedure: ESOPHAGEAL MANOMETRY (EM);  Surgeon: Gustav Shila GAILS, MD;  Location: [facility];  Service: Endoscopy;  Laterality: N/A;   HAND SURGERY     right/ thumb joints replaced both hands  OOPHORECTOMY     right foot surgery Right 08/15/2022   SHOULDER ARTHROSCOPY  02/28/2022   TOTAL HIP ARTHROPLASTY Right 07/02/2023   Procedure: ARTHROPLASTY, HIP, TOTAL, ANTERIOR APPROACH;  Surgeon: Melodi Lerner, MD;  Location: [facility];  Service: Orthopedics;  Laterality: Right;   TOTAL SHOULDER REPLACEMENT Right 06/19/2022   TUBAL LIGATION      Allergies:  Allergies[1]  Family History:  Family History  Problem Relation Age of  Onset   Diabetes Mother    Stroke Mother        brother, sister, MGM   Colon polyps Mother    Heart disease Father    Kidney disease Father    Colon polyps Father    Lung disease Father    Kidney cancer Sister    Stroke Sister    Diabetes Sister    Colon polyps Sister    Heart attack Paternal Grandfather    Celiac disease Other        niece   Colon cancer Neg Hx    Esophageal cancer Neg Hx    Rectal cancer Neg Hx    Stomach cancer Neg Hx    Restless legs syndrome Neg Hx    Neuropathy Neg Hx     Social History:  Social History[2]  Review of symptoms:  Constitutional:  Negative for unexplained weight loss, night sweats, fever, chills ENT:  Negative for nose bleeds, sinus pain, painful swallowing CV:  Negative for chest pain, shortness of breath, exercise intolerance, palpitations, loss of consciousness Resp:  Negative for cough, wheezing, shortness of breath GI:  Negative for nausea, vomiting, diarrhea, bloody stools GU:  Positives noted in HPI; otherwise negative for gross hematuria, dysuria Neuro:  Negative for seizures, poor balance, limb weakness, slurred speech Psych:  Negative for lack of energy, depression, anxiety Endocrine:  Negative for polydipsia, polyuria, symptoms of hypoglycemia (dizziness, hunger, sweating) Hematologic:  Negative for anemia, purpura, petechia, prolonged or excessive bleeding, use of anticoagulants  Allergic:  Negative for difficulty breathing or choking as a result of exposure to anything; no shellfish allergy; no allergic response (rash/itch) to materials, foods  Physical exam: BP (!) 176/89   Pulse 78   Ht 5' 2 (1.575 m)   Wt 195 lb (88.5 kg)   BMI 35.67 kg/m  GENERAL APPEARANCE:  Well appearing, well developed, well nourished, NAD HEENT: Atraumatic, Normocephalic, oropharynx clear. NECK: Supple without lymphadenopathy or thyromegaly. LUNGS: Clear to auscultation bilaterally. HEART: Regular Rate and Rhythm without murmurs, gallops,  or rubs. ABDOMEN: Soft, non-tender, No Masses. EXTREMITIES: Moves all extremities well.  Without clubbing, cyanosis, or edema. NEUROLOGIC:  Alert and oriented x 3, normal gait, CN II-XII grossly intact.  MENTAL STATUS:  Appropriate. BACK:  Minimally tender to palpation midline lumbar spine.  No CVAT SKIN:  Warm, dry and intact.    Results: U/A: 6-10 WBCs, 3-10 RBCs, few bacteria  PVR = 1 ml     [1]  Allergies Allergen Reactions   Nitrofurantoin Other (See Comments)    Makes mouth break out/blisters   Propoxyphene N-Acetaminophen  Nausea Only   Nortriptyline  Hcl Nausea And Vomiting   Hydrocodone  Bit-Homatrop Mbr Itching and Rash   Morphine  Itching and Rash  [2]  Social History Tobacco Use   Smoking status: Never   Smokeless tobacco: Never  Vaping Use   Vaping status: Never Used  Substance Use Topics   Alcohol use: No   Drug use: No   "

## 2024-03-16 LAB — URINE CULTURE

## 2024-03-17 ENCOUNTER — Ambulatory Visit: Payer: Self-pay | Admitting: Urology

## 2024-03-17 DIAGNOSIS — R8290 Unspecified abnormal findings in urine: Secondary | ICD-10-CM

## 2024-03-17 MED ORDER — LEVOFLOXACIN 500 MG PO TABS: 500.0000 mg | ORAL_TABLET | Freq: Every day | ORAL | 0 refills | Status: AC

## 2024-03-20 ENCOUNTER — Telehealth: Payer: Self-pay

## 2024-03-20 NOTE — Telephone Encounter (Addendum)
 Patient called back. I notified her what was said in the previous message. She stated she has a lot of health issues and that she does not want to add onto that with taking this medication. I asked her if she wanted me to note that she is refusing to take the medication and she said no. She was not clear on whether or not she was going to take it. She then stated she will think about it and hung up.

## 2024-03-20 NOTE — Telephone Encounter (Signed)
-----   Message from Adine Manly, MD sent at 03/19/2024  9:01 AM EST ----- Regarding: FW: patient req different medication Please notify the patient that based on the culture results and her medication allergies, that is the only antibiotic that will treat the UTI. ----- Message ----- From: Norman Rocky BROCKS Sent: 03/19/2024   8:49 AM EST To: Adine DOROTHA Manly, MD Subject: patient req different medication               Patient called in after hours and stated that the medication she got has a long list of side effects and she doesn't feel comfortable taking the medication. She is asking if there is a different medication she can take.   Medication the was prescribed: levofloxacin  (LEVAQUIN ) 500 MG tablet [[id number]]

## 2024-03-20 NOTE — Telephone Encounter (Signed)
 LMOM for pt to return call. See message below.

## 2024-03-24 ENCOUNTER — Ambulatory Visit: Admitting: Family Medicine

## 2024-03-24 ENCOUNTER — Encounter: Payer: Self-pay | Admitting: Family Medicine

## 2024-03-24 VITALS — BP 140/80 | Ht 62.0 in | Wt 200.4 lb

## 2024-03-24 DIAGNOSIS — B351 Tinea unguium: Secondary | ICD-10-CM

## 2024-03-24 MED ORDER — CICLOPIROX 8 % EX SOLN: Freq: Every day | CUTANEOUS | 0 refills | Status: AC

## 2024-03-24 NOTE — Progress Notes (Signed)
" ° °  Subjective:    Patient ID: Andrea Byrd, female    DOB: March 03, 1946, 79 y.o.   MRN: [id number]  HPI Nail problem- R middle finger, 'i think I have a nail fungus'.  First noticed ~1 month ago 'w/ a line in it'.  Sxs have progressed.  No pain.  No bleeding.   Review of Systems For ROS see HPI     Objective:   Physical Exam Vitals reviewed.  Constitutional:      General: She is not in acute distress.    Appearance: Normal appearance. She is not ill-appearing.  HENT:     Head: Normocephalic and atraumatic.  Skin:    General: Skin is warm and dry.     Comments: Nail fungus of R middle fingernail  Neurological:     General: No focal deficit present.     Mental Status: She is alert and oriented to person, place, and time.  Psychiatric:        Mood and Affect: Mood normal.        Behavior: Behavior normal.        Thought Content: Thought content normal.           Assessment & Plan:  Nail fungus- new.  R middle finger.  Start Ciclopirox  as directed.  Reviewed supportive care and red flags that should prompt return.   "

## 2024-03-24 NOTE — Patient Instructions (Addendum)
 Follow up as needed or as scheduled START the Ciclopirox  as directed Call with any questions or concerns Stay Safe!  Stay Healthy! Happy New Year!!!

## 2024-04-07 ENCOUNTER — Encounter: Admitting: Family Medicine

## 2024-04-11 ENCOUNTER — Other Ambulatory Visit: Payer: Self-pay | Admitting: Family Medicine

## 2024-04-11 DIAGNOSIS — Z1231 Encounter for screening mammogram for malignant neoplasm of breast: Secondary | ICD-10-CM

## 2024-04-29 ENCOUNTER — Ambulatory Visit: Admitting: Urology

## 2024-05-19 ENCOUNTER — Ambulatory Visit

## 2024-06-19 ENCOUNTER — Ambulatory Visit: Admitting: Family Medicine

## 2024-06-19 ENCOUNTER — Encounter: Admitting: Family Medicine

## 2024-06-24 ENCOUNTER — Ambulatory Visit: Admitting: Family Medicine
